# Patient Record
Sex: Female | Born: 1960 | Hispanic: No | Smoking: Never smoker
Health system: Southern US, Community
[De-identification: ages and names within clinical notes are randomized; demographics above are authoritative.]

## PROBLEM LIST (undated history)

## (undated) DIAGNOSIS — R0600 Dyspnea, unspecified: Secondary | ICD-10-CM

## (undated) DIAGNOSIS — R519 Headache, unspecified: Secondary | ICD-10-CM

## (undated) DIAGNOSIS — I4891 Unspecified atrial fibrillation: Secondary | ICD-10-CM

## (undated) DIAGNOSIS — E079 Disorder of thyroid, unspecified: Secondary | ICD-10-CM

## (undated) DIAGNOSIS — R413 Other amnesia: Secondary | ICD-10-CM

## (undated) DIAGNOSIS — E119 Type 2 diabetes mellitus without complications: Secondary | ICD-10-CM

## (undated) DIAGNOSIS — E785 Hyperlipidemia, unspecified: Secondary | ICD-10-CM

## (undated) DIAGNOSIS — I10 Essential (primary) hypertension: Secondary | ICD-10-CM

## (undated) DIAGNOSIS — I639 Cerebral infarction, unspecified: Secondary | ICD-10-CM

## (undated) DIAGNOSIS — G709 Myoneural disorder, unspecified: Secondary | ICD-10-CM

## (undated) DIAGNOSIS — J189 Pneumonia, unspecified organism: Secondary | ICD-10-CM

## (undated) DIAGNOSIS — R569 Unspecified convulsions: Secondary | ICD-10-CM

## (undated) DIAGNOSIS — Z9989 Dependence on other enabling machines and devices: Secondary | ICD-10-CM

## (undated) DIAGNOSIS — I2510 Atherosclerotic heart disease of native coronary artery without angina pectoris: Secondary | ICD-10-CM

---

## 2017-10-24 DIAGNOSIS — I639 Cerebral infarction, unspecified: Secondary | ICD-10-CM

## 2017-10-24 HISTORY — DX: Cerebral infarction, unspecified: I63.9

## 2021-10-21 ENCOUNTER — Other Ambulatory Visit: Payer: Self-pay

## 2021-10-21 ENCOUNTER — Encounter (HOSPITAL_COMMUNITY): Payer: Self-pay

## 2021-10-21 ENCOUNTER — Ambulatory Visit (HOSPITAL_COMMUNITY)
Admission: EM | Admit: 2021-10-21 | Discharge: 2021-10-21 | Disposition: A | Discharge status: Home / Self Care | Payer: Self-pay | Attending: Family Medicine | Admitting: Family Medicine

## 2021-10-21 ENCOUNTER — Ambulatory Visit (INDEPENDENT_AMBULATORY_CARE_PROVIDER_SITE_OTHER): Payer: Self-pay

## 2021-10-21 DIAGNOSIS — J129 Viral pneumonia, unspecified: Secondary | ICD-10-CM | POA: Insufficient documentation

## 2021-10-21 DIAGNOSIS — I2510 Atherosclerotic heart disease of native coronary artery without angina pectoris: Secondary | ICD-10-CM | POA: Insufficient documentation

## 2021-10-21 DIAGNOSIS — R059 Cough, unspecified: Secondary | ICD-10-CM

## 2021-10-21 DIAGNOSIS — Z20822 Contact with and (suspected) exposure to covid-19: Secondary | ICD-10-CM | POA: Insufficient documentation

## 2021-10-21 HISTORY — DX: Disorder of thyroid, unspecified: E07.9

## 2021-10-21 HISTORY — DX: Type 2 diabetes mellitus without complications: E11.9

## 2021-10-21 LAB — SARS CORONAVIRUS 2 (TAT 6-24 HRS): SARS Coronavirus 2: NEGATIVE

## 2021-10-21 MED ORDER — DOXYCYCLINE HYCLATE 100 MG PO CAPS: 100.0000 mg | ORAL_CAPSULE | Freq: Two times a day (BID) | ORAL | 0 refills | Status: AC

## 2021-10-21 NOTE — Discharge Instructions (Signed)
°  You have been swabbed for COVID, and the test will result in the next 24 hours. Our staff will call you if positive. If the test is positive, you should quarantine for 5 days.  °

## 2021-10-21 NOTE — ED Triage Notes (Signed)
Pt presents for cough,congestion and loss of sense of smell and taste x 2 days. She would like a covid testing today.

## 2021-10-21 NOTE — ED Provider Notes (Addendum)
[facility]    CSN: [id number] Arrival date & time: 10/21/21  1012      History   Chief Complaint Chief Complaint  Patient presents with   Cough   Nasal Congestion    HPI Christy Poole is a 60 y.o. female.    Cough Here for a 1 week h/o cough and congestion, and she felt worse starting about 2-3 days ago. Has lost her sense of taste and smell. Some subjective fever. No v/d.  PMH: CAD  Past Medical History:  Diagnosis Date   Diabetes mellitus without complication (HCC)    Thyroid disease     There are no problems to display for this patient.   History reviewed. No pertinent surgical history.  OB History   No obstetric history on file.      Home Medications    Prior to Admission medications   Medication Sig Start Date End Date Taking? Authorizing Provider  aspirin EC 81 MG tablet Take 81 mg by mouth daily. Swallow whole.   Yes [provider]  atorvastatin (LIPITOR) 80 MG tablet Take 80 mg by mouth daily.   Yes [provider]  doxycycline (VIBRAMYCIN) 100 MG capsule Take 1 capsule (100 mg total) by mouth 2 (two) times daily for 7 days. 10/21/21 10/28/21 Yes Eirene Rather, Janace Aris, MD  levothyroxine (SYNTHROID) 100 MCG tablet Take 50 mcg by mouth daily before breakfast.   Yes [provider]  warfarin (COUMADIN) 2.5 MG tablet Take 2.5 mg by mouth daily.   Yes [provider]    Family History History reviewed. No pertinent family history.  Social History Social History   Tobacco Use   Smoking status: Never   Smokeless tobacco: Never  Substance Use Topics   Alcohol use: Never   Drug use: Never     Allergies   Patient has no allergy information on record.   Review of Systems Review of Systems  Respiratory:  Positive for cough.     Physical Exam Triage Vital Signs ED Triage Vitals  Enc Vitals Group     BP 10/21/21 1116 (!) 125/97     Pulse Rate 10/21/21 1116 (!) 114     Resp 10/21/21 1116 18      Temp 10/21/21 1116 98.2 F (36.8 C)     Temp Source 10/21/21 1116 Oral     SpO2 10/21/21 1116 97 %     Weight --      Height --      Head Circumference --      Peak Flow --      Pain Score 10/21/21 1117 0     Pain Loc --      Pain Edu? --      Excl. in GC? --    No data found.  Updated Vital Signs BP (!) 125/97 (BP Location: Left Arm)    Pulse (!) 114    Temp 98.2 F (36.8 C) (Oral)    Resp 18    SpO2 97%   Visual Acuity Right Eye Distance:   Left Eye Distance:   Bilateral Distance:    Right Eye Near:   Left Eye Near:    Bilateral Near:     Physical Exam Vitals reviewed.  Constitutional:      General: She is not in acute distress.    Appearance: She is not toxic-appearing.  HENT:     Right Ear: Tympanic membrane normal.     Ears:     Comments: Left  TM is dull and gray with old scar    Nose: Nose normal.     Mouth/Throat:     Mouth: Mucous membranes are moist.     Pharynx: Oropharyngeal exudate (clear mucus) present. No posterior oropharyngeal erythema.  Eyes:     Extraocular Movements: Extraocular movements intact.     Pupils: Pupils are equal, round, and reactive to light.  Cardiovascular:     Rate and Rhythm: Normal rate and regular rhythm.     Heart sounds: No murmur heard. Pulmonary:     Breath sounds: Normal breath sounds. No wheezing.  Musculoskeletal:     Cervical back: Neck supple.  Lymphadenopathy:     Cervical: No cervical adenopathy.  Skin:    Coloration: Skin is not jaundiced or pale.  Neurological:     General: No focal deficit present.     Mental Status: She is alert and oriented to person, place, and time.  Psychiatric:        Behavior: Behavior normal.     UC Treatments / Results  Labs (all labs ordered are listed, but only abnormal results are displayed) Labs Reviewed  SARS CORONAVIRUS 2 (TAT 6-24 HRS)    EKG   Radiology DG Chest 2 View  Result Date: 10/21/2021 CLINICAL DATA:  Cough and congestion with loss of sense of  smell and taste for 2 days. EXAM: CHEST - 2 VIEW COMPARISON:  None. FINDINGS: The cardiac silhouette is borderline enlarged. The lungs are borderline hyperinflated. Airway thickening and mild interstitial opacities are present bilaterally. No focal airspace consolidation, pleural effusion, or pneumothorax is identified. No acute osseous abnormality is seen. IMPRESSION: Airway thickening and mild interstitial opacities which may reflect atypical/viral infection. Electronically Signed   By: Sebastian Ache M.D.   On: 10/21/2021 12:17    Procedures Procedures (including critical care time)  Medications Ordered in UC Medications - No data to display  Initial Impression / Assessment and Plan / UC Course  I have reviewed the triage vital signs and the nursing notes.  Pertinent labs & imaging results that were available during my care of the patient were reviewed by me and considered in my medical decision making (see chart for details).     See above CXR interpretation, so possible pneumonia. Will swab for covid, and begin oral antibiotic  Final Clinical Impressions(s) / UC Diagnoses   Final diagnoses:  Pneumonia, viral     Discharge Instructions       You have been swabbed for COVID, and the test will result in the next 24 hours. Our staff will call you if positive. If the test is positive, you should quarantine for 5 days.        ED Prescriptions     Medication Sig Dispense Auth. Provider   doxycycline (VIBRAMYCIN) 100 MG capsule Take 1 capsule (100 mg total) by mouth 2 (two) times daily for 7 days. 14 capsule Marlinda Mike, Janace Aris, MD      PDMP not reviewed this encounter.   Zenia Resides, MD 10/21/21 1223    Zenia Resides, MD 10/21/21 [phone]

## 2021-12-01 ENCOUNTER — Encounter (HOSPITAL_BASED_OUTPATIENT_CLINIC_OR_DEPARTMENT_OTHER): Payer: Self-pay | Admitting: Family Medicine

## 2021-12-01 ENCOUNTER — Other Ambulatory Visit: Payer: Self-pay

## 2021-12-01 ENCOUNTER — Ambulatory Visit (INDEPENDENT_AMBULATORY_CARE_PROVIDER_SITE_OTHER): Payer: 59 | Admitting: Family Medicine

## 2021-12-01 DIAGNOSIS — I4821 Permanent atrial fibrillation: Secondary | ICD-10-CM | POA: Insufficient documentation

## 2021-12-01 DIAGNOSIS — E039 Hypothyroidism, unspecified: Secondary | ICD-10-CM | POA: Diagnosis not present

## 2021-12-01 DIAGNOSIS — E785 Hyperlipidemia, unspecified: Secondary | ICD-10-CM

## 2021-12-01 DIAGNOSIS — E119 Type 2 diabetes mellitus without complications: Secondary | ICD-10-CM

## 2021-12-01 DIAGNOSIS — I4891 Unspecified atrial fibrillation: Secondary | ICD-10-CM | POA: Diagnosis not present

## 2021-12-01 DIAGNOSIS — Z8673 Personal history of transient ischemic attack (TIA), and cerebral infarction without residual deficits: Secondary | ICD-10-CM | POA: Diagnosis not present

## 2021-12-01 NOTE — Progress Notes (Signed)
New Patient Office Visit  Subjective:  Patient ID: Christy Poole, female    DOB: 07/10/1961  Age: 61 y.o. MRN: [id number]  CC:  Chief Complaint  Patient presents with   Establish Care    Prior PCP - [place]   Referral    Patient is a cardiac patient with hx of Afib and is requesting a referral to cardiology. She also had a stroke in 2019 and has neuropathy, she is requesting a referral to Neurology   Labs Only    Patient's son in requesting to have full lab panels and INR check as she is on coumadin and has not had it checked recently    HPI Christy Poole is a 61 year old female presenting to establish in clinic.  She is accompanied by family member who assists with interpretation.  They have current concerns as outlined above.  Past medical history significant for diabetes, hyperlipidemia, atrial fibrillation, mitral valve stenosis, hypothyroidism.  Atrial fibrillation: Reports that she was following with cardiology when living in [place].  Currently managed on warfarin, takes 2.5 mg daily.  Denies any issues of bleeding, reports that INR generally has been well controlled.  Hypothyroidism: Currently takes levothyroxine 50 mcg daily, unsure of prior TSH.  No obvious symptoms of thyroid disease at present  Hyperlipidemia: Currently taking atorvastatin 80 mg, has been tolerating medication well, no issues with myalgias.  Diabetes: Denies any current medications, mainly managing with lifestyle modifications, unsure of recent A1c.  Review of prior records indicates that she was being prescribed metformin at 1 point however this was discontinued  Most recently, patient was living in [place], moved here in December 2022 with family who moved here for work.  Past Medical History:  Diagnosis Date   Atrial fibrillation ([place])    Diabetes mellitus without complication ([place])    Stroke [facility])    Thyroid disease     History reviewed. No pertinent surgical history.  History reviewed. No  pertinent family history.  Social History   Socioeconomic History   Marital status: Widowed    Spouse name: Not on file   Number of children: Not on file   Years of education: Not on file   Highest education level: Not on file  Occupational History   Not on file  Tobacco Use   Smoking status: Never   Smokeless tobacco: Never  Vaping Use   Vaping Use: Never used  Substance and Sexual Activity   Alcohol use: Never   Drug use: Never   Sexual activity: Never  Other Topics Concern   Not on file  Social History Narrative   Not on file   Social Determinants of Health   Financial Resource Strain: Not on file  Food Insecurity: Not on file  Transportation Needs: Not on file  Physical Activity: Not on file  Stress: Not on file  Social Connections: Not on file  Intimate Partner Violence: Not on file    Objective:   Today's Vitals: BP 124/70    Pulse 68    Ht 4' 9.5" (1.461 m)    Wt 107 lb (48.5 kg)    SpO2 100%    BMI 22.75 kg/m   Physical Exam  61 year old female in no acute distress Cardiovascular exam with regular rate, irregularly irregular rhythm Lungs clear to auscultation bilaterally  Assessment & Plan:   Problem List Items Addressed This Visit       Cardiovascular and Mediastinum   Atrial fibrillation ([place])    In atrial fibrillation  on exam today, continue with warfarin, reportedly has been tolerating with good control of INR in therapeutic range We will check INR today to evaluate Will refer to cardiology for patient to establish in regards to her atrial fibrillation and cardiac history      Relevant Orders   CBC with Differential/Platelet (Completed)   Comprehensive metabolic panel (Completed)   Hemoglobin A1c (Completed)   Ambulatory referral to Cardiology   INR/PT (Completed)     Endocrine   Hypothyroidism    Appears euthyroid clinically, will continue with current dose of levothyroxine We will check TSH in order to evaluate current status and any  dose changes required      Relevant Orders   TSH (Completed)   Diabetes mellitus ([place])    Uncertain status, will check hemoglobin A1c in order to determine if further treatment is required        Other   History of stroke    History of stroke, will request records from PCP, will refer to neurology for further evaluation and recommendations      Relevant Orders   CBC with Differential/Platelet (Completed)   Comprehensive metabolic panel (Completed)   Ambulatory referral to Neurology   Dyslipidemia    Tolerating atorvastatin, will continue this time We will check lipid panel in order to assess current status      Relevant Orders   Lipid panel (Completed)    No facility-administered encounter medications on file as of 12/01/2021.   Outpatient Encounter Medications as of 12/01/2021  Medication Sig   aspirin EC 81 MG tablet Take 81 mg by mouth daily. Swallow whole.   atorvastatin (LIPITOR) 80 MG tablet Take 80 mg by mouth daily.   warfarin (COUMADIN) 2.5 MG tablet Take 5 mg by mouth at bedtime.   [DISCONTINUED] levothyroxine (SYNTHROID) 100 MCG tablet Take 50 mcg by mouth at bedtime.    Follow-up: Return in about 4 weeks (around 12/29/2021).  Plan for follow-up in about 4 weeks to monitor, review labs and discuss any further interventions recommended  Naara Kelty J [place], MD

## 2021-12-01 NOTE — Patient Instructions (Signed)
°  Medication Instructions:  Your physician recommends that you continue on your current medications as directed. Please refer to the Current Medication list given to you today. --If you need a refill on any your medications before your next appointment, please call your pharmacy first. If no refills are authorized on file call the office.-- Lab Work: Your physician has recommended that you have lab work today: CBC, CMP, Lipid, A1C, INR, and TSH If you have labs (blood work) drawn today and your tests are completely normal, you will receive your results via [place] a phone call from our staff.  Please ensure you check your voicemail in the event that you authorized detailed messages to be left on a delegated number. If you have any lab test that is abnormal or we need to change your treatment, we will call you to review the results.  Referrals/Procedures/Imaging: A referral has been placed for you to Neurology for evaluation and treatment. Someone from the scheduling department will be in contact with you in regards to coordinating your consultation. If you do not hear from any of the schedulers within 7-10 business days please give their office a call.  A referral has been placed for you to Cardiology for evaluation and treatment. Someone from the scheduling department will be in contact with you in regards to coordinating your consultation. If you do not hear from any of the schedulers within 7-10 business days please give their office a call.   Follow-Up: Your next appointment:   Your physician recommends that you schedule a follow-up appointment in: 3-4 WEEKS with Dr. de [place]  You will receive a text message or e-mail with a link to a survey about your care and experience with [place] today! We would greatly appreciate your feedback!   Thanks for letting us be apart of your health journey!!  Primary Care and Sports Medicine   Dr. Arlina Robes [place]   We encourage you to activate your  patient portal called "MyChart".  Sign up information is provided on this After Visit Summary.  MyChart is used to connect with patients for Virtual Visits (Telemedicine).  Patients are able to view lab/test results, encounter notes, upcoming appointments, etc.  Non-urgent messages can be sent to your provider as well. To learn more about what you can do with MyChart, please visit --  [email or web address].

## 2021-12-02 LAB — CBC WITH DIFFERENTIAL/PLATELET
Basophils Absolute: 0.1 10*3/uL (ref 0.0–0.2)
Basos: 1 %
EOS (ABSOLUTE): 0.2 10*3/uL (ref 0.0–0.4)
Eos: 3 %
Hematocrit: 41.7 % (ref 34.0–46.6)
Hemoglobin: 13.4 g/dL (ref 11.1–15.9)
Immature Grans (Abs): 0 10*3/uL (ref 0.0–0.1)
Immature Granulocytes: 0 %
Lymphocytes Absolute: 1.5 10*3/uL (ref 0.7–3.1)
Lymphs: 24 %
MCH: 28.2 pg (ref 26.6–33.0)
MCHC: 32.1 g/dL (ref 31.5–35.7)
MCV: 88 fL (ref 79–97)
Monocytes Absolute: 0.4 10*3/uL (ref 0.1–0.9)
Monocytes: 7 %
Neutrophils Absolute: 4 10*3/uL (ref 1.4–7.0)
Neutrophils: 65 %
Platelets: 167 10*3/uL (ref 150–450)
RBC: 4.76 x10E6/uL (ref 3.77–5.28)
RDW: 12.7 % (ref 11.7–15.4)
WBC: 6.2 10*3/uL (ref 3.4–10.8)

## 2021-12-02 LAB — COMPREHENSIVE METABOLIC PANEL
ALT: 23 IU/L (ref 0–32)
AST: 26 IU/L (ref 0–40)
Albumin/Globulin Ratio: 1.5 (ref 1.2–2.2)
Albumin: 4.4 g/dL (ref 3.8–4.8)
Alkaline Phosphatase: 103 IU/L (ref 44–121)
BUN/Creatinine Ratio: 18 (ref 12–28)
BUN: 16 mg/dL (ref 8–27)
Bilirubin Total: 0.4 mg/dL (ref 0.0–1.2)
CO2: 23 mmol/L (ref 20–29)
Calcium: 10 mg/dL (ref 8.7–10.3)
Chloride: 105 mmol/L (ref 96–106)
Creatinine, Ser: 0.88 mg/dL (ref 0.57–1.00)
Globulin, Total: 2.9 g/dL (ref 1.5–4.5)
Glucose: 115 mg/dL — ABNORMAL HIGH (ref 70–99)
Potassium: 4.9 mmol/L (ref 3.5–5.2)
Sodium: 140 mmol/L (ref 134–144)
Total Protein: 7.3 g/dL (ref 6.0–8.5)
eGFR: 74 mL/min/{1.73_m2} (ref >59)

## 2021-12-02 LAB — LIPID PANEL
Chol/HDL Ratio: 2 ratio (ref 0.0–4.4)
Cholesterol, Total: 104 mg/dL (ref 100–199)
HDL: 51 mg/dL (ref >39)
LDL Chol Calc (NIH): 36 mg/dL (ref 0–99)
Triglycerides: 84 mg/dL (ref 0–149)
VLDL Cholesterol Cal: 17 mg/dL (ref 5–40)

## 2021-12-02 LAB — HEMOGLOBIN A1C
Est. average glucose Bld gHb Est-mCnc: 143 mg/dL
Hgb A1c MFr Bld: 6.6 % — ABNORMAL HIGH (ref 4.8–5.6)

## 2021-12-02 LAB — PROTIME-INR
INR: 2.1 — ABNORMAL HIGH (ref 0.9–1.2)
Prothrombin Time: 21.4 s — ABNORMAL HIGH (ref 9.1–12.0)

## 2021-12-02 LAB — TSH: TSH: 2.62 u[IU]/mL (ref 0.450–4.500)

## 2021-12-08 ENCOUNTER — Emergency Department (HOSPITAL_COMMUNITY): Payer: 59

## 2021-12-08 ENCOUNTER — Encounter (HOSPITAL_COMMUNITY): Payer: Self-pay | Admitting: Pediatrics

## 2021-12-08 ENCOUNTER — Other Ambulatory Visit: Payer: Self-pay

## 2021-12-08 ENCOUNTER — Observation Stay (HOSPITAL_COMMUNITY)
Admission: EM | Admit: 2021-12-08 | Discharge: 2021-12-11 | Disposition: A | Discharge status: Home Health | Payer: 59 | Attending: Family Medicine | Admitting: Family Medicine

## 2021-12-08 DIAGNOSIS — I4821 Permanent atrial fibrillation: Secondary | ICD-10-CM | POA: Diagnosis present

## 2021-12-08 DIAGNOSIS — D696 Thrombocytopenia, unspecified: Secondary | ICD-10-CM | POA: Diagnosis not present

## 2021-12-08 DIAGNOSIS — E039 Hypothyroidism, unspecified: Secondary | ICD-10-CM | POA: Insufficient documentation

## 2021-12-08 DIAGNOSIS — E119 Type 2 diabetes mellitus without complications: Secondary | ICD-10-CM | POA: Insufficient documentation

## 2021-12-08 DIAGNOSIS — Z20822 Contact with and (suspected) exposure to covid-19: Secondary | ICD-10-CM | POA: Insufficient documentation

## 2021-12-08 DIAGNOSIS — Z7982 Long term (current) use of aspirin: Secondary | ICD-10-CM | POA: Insufficient documentation

## 2021-12-08 DIAGNOSIS — Z8673 Personal history of transient ischemic attack (TIA), and cerebral infarction without residual deficits: Secondary | ICD-10-CM | POA: Insufficient documentation

## 2021-12-08 DIAGNOSIS — I2510 Atherosclerotic heart disease of native coronary artery without angina pectoris: Secondary | ICD-10-CM | POA: Diagnosis not present

## 2021-12-08 DIAGNOSIS — Z955 Presence of coronary angioplasty implant and graft: Secondary | ICD-10-CM | POA: Diagnosis not present

## 2021-12-08 DIAGNOSIS — E785 Hyperlipidemia, unspecified: Secondary | ICD-10-CM | POA: Diagnosis present

## 2021-12-08 DIAGNOSIS — Z7901 Long term (current) use of anticoagulants: Secondary | ICD-10-CM | POA: Diagnosis not present

## 2021-12-08 DIAGNOSIS — Z79899 Other long term (current) drug therapy: Secondary | ICD-10-CM | POA: Diagnosis not present

## 2021-12-08 DIAGNOSIS — I4891 Unspecified atrial fibrillation: Secondary | ICD-10-CM | POA: Insufficient documentation

## 2021-12-08 DIAGNOSIS — I050 Rheumatic mitral stenosis: Secondary | ICD-10-CM | POA: Diagnosis present

## 2021-12-08 DIAGNOSIS — G459 Transient cerebral ischemic attack, unspecified: Principal | ICD-10-CM | POA: Insufficient documentation

## 2021-12-08 DIAGNOSIS — R531 Weakness: Secondary | ICD-10-CM | POA: Diagnosis present

## 2021-12-08 DIAGNOSIS — R29898 Other symptoms and signs involving the musculoskeletal system: Secondary | ICD-10-CM | POA: Diagnosis present

## 2021-12-08 HISTORY — DX: Unspecified atrial fibrillation: I48.91

## 2021-12-08 HISTORY — DX: Cerebral infarction, unspecified: I63.9

## 2021-12-08 LAB — CBC WITH DIFFERENTIAL/PLATELET
Abs Immature Granulocytes: 0 10*3/uL (ref 0.00–0.07)
Basophils Absolute: 0.1 10*3/uL (ref 0.0–0.1)
Basophils Relative: 1 %
Eosinophils Absolute: 0.2 10*3/uL (ref 0.0–0.5)
Eosinophils Relative: 3 %
HCT: 42.6 % (ref 36.0–46.0)
Hemoglobin: 13.6 g/dL (ref 12.0–15.0)
Immature Granulocytes: 0 %
Lymphocytes Relative: 31 %
Lymphs Abs: 2.1 10*3/uL (ref 0.7–4.0)
MCH: 28.3 pg (ref 26.0–34.0)
MCHC: 31.9 g/dL (ref 30.0–36.0)
MCV: 88.6 fL (ref 80.0–100.0)
Monocytes Absolute: 0.5 10*3/uL (ref 0.1–1.0)
Monocytes Relative: 8 %
Neutro Abs: 3.8 10*3/uL (ref 1.7–7.7)
Neutrophils Relative %: 57 %
Platelets: 169 10*3/uL (ref 150–400)
RBC: 4.81 MIL/uL (ref 3.87–5.11)
RDW: 13.3 % (ref 11.5–15.5)
WBC: 6.8 10*3/uL (ref 4.0–10.5)
nRBC: 0 % (ref 0.0–0.2)

## 2021-12-08 LAB — BASIC METABOLIC PANEL
Anion gap: 8 (ref 5–15)
BUN: 15 mg/dL (ref 8–23)
CO2: 23 mmol/L (ref 22–32)
Calcium: 9.4 mg/dL (ref 8.9–10.3)
Chloride: 107 mmol/L (ref 98–111)
Creatinine, Ser: 0.8 mg/dL (ref 0.44–1.00)
GFR, Estimated: >60 mL/min (ref >60)
Glucose, Bld: 119 mg/dL — ABNORMAL HIGH (ref 70–99)
Potassium: 4.3 mmol/L (ref 3.5–5.1)
Sodium: 138 mmol/L (ref 135–145)

## 2021-12-08 LAB — TROPONIN I (HIGH SENSITIVITY)
Troponin I (High Sensitivity): 6 ng/L (ref <18)
Troponin I (High Sensitivity): 7 ng/L (ref <18)

## 2021-12-08 NOTE — ED Triage Notes (Signed)
Son at bedside, reported stroke this morning around 4 am upon waking up, c/o weakness, unable to speak, no balance; reported weakness; and left sided facial drop. C/O chest pain for two days now.

## 2021-12-08 NOTE — ED Provider Triage Note (Signed)
Emergency Medicine Provider Triage Evaluation Note  Christy Poole , a 61 y.o. female  was evaluated in triage.  Pt complains of altered mental status, chest pain.  Family states over last few days patient has had weakness.  Yesterday evening around 10 PM had some dizziness and seemed "off" when walking.  Patient woke up this morning and had some chest pain.  No vision changes.  History of prior CVA and A-fib.  No recent falls or injuries.  Family states patient has had some confusion.  Review of Systems  Positive: Weakness, chest pain, confusion Negative: Fever, emesis  Physical Exam  BP (!) 140/94 (BP Location: Left Arm)    Pulse 63    Temp 98.4 F (36.9 C) (Oral)    Resp 18    Ht 4' 9.5" (1.461 m)    Wt 48.5 kg    SpO2 100%    BMI 22.75 kg/m  Gen:   Awake, no distress   Resp:  Normal effort  MSK:   Moves extremities without difficulty  Neuro:  Cn 2-12 grossly intact, equal strength, intact sensation Other:    Medical Decision Making  Medically screening exam initiated at 4:35 PM.  Appropriate orders placed.  Christy NAZYIAH WELLMAN was informed that the remainder of the evaluation will be completed by another provider, this initial triage assessment does not replace that evaluation, and the importance of remaining in the ED until their evaluation is complete.  Weakness, chest pain, confusion  Patient is not a code stroke, does not meet LVO criteria.   Ewing Fandino A, PA-C 12/08/21 1637

## 2021-12-09 ENCOUNTER — Emergency Department (HOSPITAL_COMMUNITY): Payer: 59

## 2021-12-09 ENCOUNTER — Observation Stay (HOSPITAL_BASED_OUTPATIENT_CLINIC_OR_DEPARTMENT_OTHER): Payer: 59

## 2021-12-09 ENCOUNTER — Encounter (HOSPITAL_COMMUNITY): Payer: Self-pay | Admitting: Family Medicine

## 2021-12-09 DIAGNOSIS — G459 Transient cerebral ischemic attack, unspecified: Secondary | ICD-10-CM

## 2021-12-09 DIAGNOSIS — R29898 Other symptoms and signs involving the musculoskeletal system: Secondary | ICD-10-CM | POA: Diagnosis not present

## 2021-12-09 DIAGNOSIS — Z8673 Personal history of transient ischemic attack (TIA), and cerebral infarction without residual deficits: Secondary | ICD-10-CM

## 2021-12-09 DIAGNOSIS — I4891 Unspecified atrial fibrillation: Secondary | ICD-10-CM

## 2021-12-09 DIAGNOSIS — E119 Type 2 diabetes mellitus without complications: Secondary | ICD-10-CM | POA: Diagnosis not present

## 2021-12-09 DIAGNOSIS — E785 Hyperlipidemia, unspecified: Secondary | ICD-10-CM | POA: Diagnosis not present

## 2021-12-09 DIAGNOSIS — E039 Hypothyroidism, unspecified: Secondary | ICD-10-CM

## 2021-12-09 LAB — ECHOCARDIOGRAM COMPLETE
AR max vel: 0.96 cm2
AV Area VTI: 0.94 cm2
AV Area mean vel: 0.92 cm2
AV Mean grad: 3 mmHg
AV Peak grad: 6 mmHg
Ao pk vel: 1.22 m/s
Area-P 1/2: 1.95 cm2
Calc EF: 48.3 %
Height: 57.5 in
MV VTI: 0.66 cm2
S' Lateral: 2.7 cm
Single Plane A2C EF: 46.9 %
Single Plane A4C EF: 51.8 %
Weight: 1712 oz

## 2021-12-09 LAB — PROTIME-INR
INR: 2.2 — ABNORMAL HIGH (ref 0.8–1.2)
Prothrombin Time: 24.7 seconds — ABNORMAL HIGH (ref 11.4–15.2)

## 2021-12-09 LAB — RESP PANEL BY RT-PCR (FLU A&B, COVID) ARPGX2
Influenza A by PCR: NEGATIVE
Influenza B by PCR: NEGATIVE
SARS Coronavirus 2 by RT PCR: NEGATIVE

## 2021-12-09 LAB — CBG MONITORING, ED: Glucose-Capillary: 128 mg/dL — ABNORMAL HIGH (ref 70–99)

## 2021-12-09 MED ORDER — ASPIRIN EC 81 MG PO TBEC
81.0000 mg | DELAYED_RELEASE_TABLET | Freq: Every day | ORAL | Status: DC
  Administered 2021-12-09 – 2021-12-11 (×3): 81 mg via ORAL
  Filled 2021-12-09 (×3): qty 1

## 2021-12-09 MED ORDER — ACETAMINOPHEN 500 MG PO TABS: 1000.0000 mg | ORAL_TABLET | Freq: Four times a day (QID) | ORAL | Status: DC | PRN

## 2021-12-09 MED ORDER — LACTATED RINGERS IV SOLN: INTRAVENOUS | Status: DC

## 2021-12-09 MED ORDER — IOHEXOL 350 MG/ML SOLN
75.0000 mL | Freq: Once | INTRAVENOUS | Status: AC | PRN
  Administered 2021-12-09: 75 mL via INTRAVENOUS

## 2021-12-09 MED ORDER — WARFARIN - PHARMACIST DOSING INPATIENT: Freq: Every day | Status: DC

## 2021-12-09 MED ORDER — ATORVASTATIN CALCIUM 80 MG PO TABS
80.0000 mg | ORAL_TABLET | Freq: Every day | ORAL | Status: DC
  Administered 2021-12-09 – 2021-12-11 (×3): 80 mg via ORAL
  Filled 2021-12-09 (×3): qty 1

## 2021-12-09 MED ORDER — STROKE: EARLY STAGES OF RECOVERY BOOK
Freq: Once | Status: AC
  Filled 2021-12-09: qty 1

## 2021-12-09 MED ORDER — SENNOSIDES-DOCUSATE SODIUM 8.6-50 MG PO TABS: 1.0000 | ORAL_TABLET | Freq: Every evening | ORAL | Status: DC | PRN

## 2021-12-09 MED ORDER — LEVOTHYROXINE SODIUM 50 MCG PO TABS
50.0000 ug | ORAL_TABLET | Freq: Every day | ORAL | Status: DC
  Administered 2021-12-09 – 2021-12-10 (×2): 50 ug via ORAL
  Filled 2021-12-09 (×2): qty 1

## 2021-12-09 MED ORDER — WARFARIN SODIUM 5 MG PO TABS
5.0000 mg | ORAL_TABLET | Freq: Once | ORAL | Status: AC
  Administered 2021-12-09: 5 mg via ORAL
  Filled 2021-12-09: qty 1

## 2021-12-09 NOTE — Assessment & Plan Note (Signed)
Uncertain status, will check hemoglobin A1c in order to determine if further treatment is required

## 2021-12-09 NOTE — ED Notes (Signed)
ED Provider at bedside. 

## 2021-12-09 NOTE — ED Notes (Signed)
Provider at bedside

## 2021-12-09 NOTE — Assessment & Plan Note (Signed)
In atrial fibrillation on exam today, continue with warfarin, reportedly has been tolerating with good control of INR in therapeutic range We will check INR today to evaluate Will refer to cardiology for patient to establish in regards to her atrial fibrillation and cardiac history

## 2021-12-09 NOTE — Assessment & Plan Note (Signed)
Recent LDL 38, HDL 51. Will continue high-intensity statin.

## 2021-12-09 NOTE — Progress Notes (Signed)
ANTICOAGULATION CONSULT NOTE - Initial Consult  Pharmacy Consult for warfarin Indication: atrial fibrillation  No Known Allergies  Patient Measurements: Height: 4' 9.5" (146.1 cm) Weight: 48.5 kg (107 lb) IBW/kg (Calculated) : 39.75  Vital Signs: Temp: 97.9 F (36.6 C) (02/16 1230) Temp Source: Oral (02/16 1230) BP: 148/78 (02/16 1230) Pulse Rate: 84 (02/16 1230)  Labs: Recent Labs    12/08/21 1657 12/08/21 2111 12/09/21 1233  HGB 13.6  --   --   HCT 42.6  --   --   PLT 169  --   --   LABPROT  --   --  24.7*  INR  --   --  2.2*  CREATININE 0.80  --   --   TROPONINIHS 6 7  --     Estimated Creatinine Clearance: 49.8 mL/min (by C-G formula based on SCr of 0.8 mg/dL).   Medical History: Past Medical History:  Diagnosis Date   Atrial fibrillation ([place])    Diabetes mellitus without complication ([place])    Stroke ([place])    Thyroid disease     Assessment: 61 YO female admitted for facial droop and left-sided weakness. Patient was on warfarin prior to admission for atrial fibrillation. Today's INR was therapeutic at 2.2. Will continue home regimen of warfarin 5 mg daily.   Goal of Therapy:  INR 2-3 Monitor platelets by anticoagulation protocol: Yes   Plan:  Warfarin 5 mg x1 Monitor CBC, INR, drug interactions, and s/sx of bleeding daily  Joseph Art, Pharm.D. PGY-1 Pharmacy Resident [phone] 12/09/2021 2:28 PM

## 2021-12-09 NOTE — Progress Notes (Signed)
SLP Cancellation Note  Patient Details Name: Christy Poole MRN: [id number] DOB: 07/31/1961   Cancelled treatment:       Reason Eval/Treat Not Completed: SLP screened, no needs identified, will sign off.  Pt passed [facility] swallow screen and Dois Davenport, RN denied any difficulty with p.o. intake since. Thus, no formalized SLP swallow eval is needed per protocol. Will sign off.     Chalsea Darko I. Vear Clock, MS, CCC-SLP Acute Rehabilitation Services Office number [phone] Pager [phone]  Scheryl Marten 12/09/2021, 2:50 PM

## 2021-12-09 NOTE — Assessment & Plan Note (Signed)
Appears euthyroid clinically, will continue with current dose of levothyroxine We will check TSH in order to evaluate current status and any dose changes required

## 2021-12-09 NOTE — TOC Initial Note (Signed)
Transition of Care [facility]) - Initial/Assessment Note    Patient Details  Name: Christy Poole MRN: [id number] Date of Birth: May 25, 1961  Transition of [facility]) CM/SW Contact:    Verdell Carmine, RN Phone Number: 12/09/2021, 2:08 PM  Clinical Narrative:                  Medication assistance consult for TOC. Patient has health insurance therefore is not eligible for [facility] medication assistance.    The Transition of Care Department [facility]) has reviewed patient and no TOC needs have been identified at this time. We will continue to monitor patient advancement through interdisciplinary progression rounds. If new patient transition needs arise, please place a TOC consult         Patient Goals and CMS Choice        Expected Discharge Plan and Services                                                Prior Living Arrangements/Services                       Activities of Daily Living      Permission Sought/Granted                  Emotional Assessment              Admission diagnosis:  Left hand weakness [R29.898] Patient Active Problem List   Diagnosis Date Noted   Left hand weakness 12/09/2021   Diabetes mellitus ([place]) 12/09/2021   Hypothyroidism 12/01/2021   Atrial fibrillation ([place]) 12/01/2021   History of stroke 12/01/2021   Dyslipidemia 12/01/2021   PCP:  de [place], Raymond J, MD Pharmacy:   [place], [place] - [address] [place] [address] Phone: [phone] Fax: [phone]     Social Determinants of Health (SDOH) Interventions    Readmission Risk Interventions No flowsheet data found.

## 2021-12-09 NOTE — ED Notes (Signed)
Patient transported to MRI 

## 2021-12-09 NOTE — Assessment & Plan Note (Signed)
History of stroke, will request records from PCP, will refer to neurology for further evaluation and recommendations

## 2021-12-09 NOTE — H&P (Signed)
History and Physical    Patient: Christy Poole IRS:[id number] DOB: 12-28-1960 DOA: 12/08/2021 DOS: the patient was seen and examined on 12/09/2021 PCP: de [place], Raymond J, MD  Patient coming from: Home  Chief Complaint:  Chief Complaint  Patient presents with   Weakness    HPI: Christy Poole is a 61 y.o. female with medical history significant of stroke with residual left sided weakness, atrial fibrillation on coumadin, hypothyroidism and CAD s/p remote PCI who presented to the ED 2/15 with facial droop and left-sided weakness which had improved by the time of presentation.   The patient speaks only [place], lives with her son, who speaks [place] as well, in [place] where they moved a couple months ago from [place]. She stated she woke up early in the morning with droopy left face and worsen weakness in her left arm than she normally does. He confirms seeing evidence of this as well, and states she's had overall waxing/waning of weakness on the left side though progressively worsened over the past several months. These symptoms returned to their baseline prior to getting to the hospital and they ended up waiting about 18 hours to be seen after CT showed no acute stroke. Subsequent MRI has shown no acute stroke either, though there is evidence of the prior moderate-sized cortical/subcortical right MCA territory infarct as well as white matter chronic small vessel ischemic disease including a lacunar infarct of the left parietal white matter.   Neurology was consulted, recommended CTA head and neck, echocardiogram and PT/OT evaluations. Hospitalists called to admit.  Review of Systems: As mentioned in the history of present illness. All other systems reviewed and are negative.She's also had intermittent vision changes, blurry vision, and global headache. These symptoms are resolved. She's complained of intermittent chest discomfort, including at the time or presentation though is resolved now, no  dyspnea. No right ear pain, though does have decreased hearing out of that ear. No discharge.   Past Medical History:  Diagnosis Date   Atrial fibrillation (HCC)    Diabetes mellitus without complication (HCC)    Stroke [facility])    Thyroid disease    History reviewed. No pertinent surgical history.  Social History:  reports that she has never smoked. She has never used smokeless tobacco. She reports that she does not drink alcohol and does not use drugs.  No Known Allergies  History reviewed. No pertinent family history.  Prior to Admission medications   Medication Sig Start Date End Date Taking? Authorizing Provider  acetaminophen (TYLENOL) 500 MG tablet Take 1,000 mg by mouth every 6 (six) hours as needed for mild pain, headache or fever.   Yes [provider]  aspirin EC 81 MG tablet Take 81 mg by mouth daily. Swallow whole.   Yes [provider]  atorvastatin (LIPITOR) 80 MG tablet Take 80 mg by mouth daily.   Yes [provider]  levothyroxine (SYNTHROID) 50 MCG tablet Take 50 mcg by mouth at bedtime.   Yes [provider]  warfarin (COUMADIN) 2.5 MG tablet Take 5 mg by mouth at bedtime.   Yes [provider]    Physical Exam: Vitals:   12/09/21 0918 12/09/21 1000 12/09/21 1030 12/09/21 1230  BP: (!) 162/93 (!) 143/77 114/78 (!) 148/78  Pulse: 93 70 67 84  Resp: (!) 21 (!) 21 (!) 21 19  Temp: 97.6 F (36.4 C)   97.9 F (36.6 C)  TempSrc: Oral   Oral  SpO2: 100% 99% 100% 100%  Weight:      Height:      Gen: 61 y.o. female in no distress Pulm: Nonlabored breathing room air. Clear. CV: Irreg irreg, no murmur, rub, or gallop. No JVD, no pitting dependent edema. GI: Abdomen soft, non-tender, non-distended, with normoactive bowel sounds.  Ext: Warm, no deformities Skin: No rashes, lesions or ulcers on visualized skin. Neuro: Alert and oriented, speech at baseline per son. Left shoulder 3/5, elbow 4/5, grip 3/5, left hip flexor 2/5,  distally 4/5. Right strength normal. No facial droop currently. Psych: Judgement and insight appear fair. Mood euthymic & affect congruent. Behavior is appropriate.    Data Reviewed: BMP wnl and at baseline, though glucose 119.  Troponin wnl at 6 > 7 ECG low voltage with AFlutter pattern, vent rate 88bpm. No ST elevations. CBC w/diff wnl INR therapeutic at 2.2 Influenza and SARS-CoV-2 screening negative. CT head and MR brain personally reviewed, agree with radiologist interpretation without obvious new stroke.  Assessment and Plan: * Left hand weakness- (present on admission) Neurology consulted, requested admission for echo, therapy evaluations, and neuroimaging. Will place in observation, neuro checks, SLP, PT, OT ordered. Labs recently done. INR therapeutic without evidence of hemorrhage, will plan to continue coumadin, statin. Neurology formal note pending, though I discussed with Dr. Viviann Spare.  Diabetes mellitus (HCC) NIDT2DM. No home meds listed for this. Recent HbA1c at PCP 2/8 was 6.6% which is at goal. - Very sensitive SSI.  Dyslipidemia- (present on admission) Recent LDL 38, HDL 51. Will continue high-intensity statin.  Atrial fibrillation (HCC)- (present on admission) Presumed to be chronic. Rate is controlled.  - Continue coumadin - Not on rate control medication at home.  Hypothyroidism- (present on admission) Recent TSH 2.620. Continue synthroid.  Advance Care Planning: Full  Consults: Neurology  Family Communication: Son by speakerphone at bedside  Severity of Illness: The appropriate patient status for this patient is OBSERVATION. Observation status is judged to be reasonable and necessary in order to provide the required intensity of service to ensure the patient's safety. The patient's presenting symptoms, physical exam findings, and initial radiographic and laboratory data in the context of their medical condition is felt to place them at decreased risk for  further clinical deterioration. Furthermore, it is anticipated that the patient will be medically stable for discharge from the hospital within 2 midnights of admission.   Author: Tyrone Nine, MD 12/09/2021 2:19 PM  For on call review [email or web address].

## 2021-12-09 NOTE — Progress Notes (Signed)
Patient son at bedside requesting information about medicaid/medicare because patient had services when they lived in [place]. Informed son that SW & CM were gone for the day, but would be back in AM.

## 2021-12-09 NOTE — Assessment & Plan Note (Signed)
NIDT2DM. No home meds listed for this. Recent HbA1c at PCP 2/8 was 6.6% which is at goal. - Very sensitive SSI.

## 2021-12-09 NOTE — Assessment & Plan Note (Signed)
Tolerating atorvastatin, will continue this time We will check lipid panel in order to assess current status

## 2021-12-09 NOTE — Assessment & Plan Note (Addendum)
Acute on chronic. No definitive evidence of acute stroke at this time. Will continue medications as previously recommended, final recommendations from stroke neurology pending at this time.   - Home health and DME arranged.

## 2021-12-09 NOTE — Assessment & Plan Note (Signed)
Recent TSH 2.620. Continue synthroid.

## 2021-12-09 NOTE — ED Provider Notes (Signed)
[facility] EMERGENCY DEPARTMENT Provider Note   CSN: [id number] Arrival date & time: 12/08/21  1547     History  Chief Complaint  Patient presents with   Weakness    Christy Poole is a 61 y.o. female.  HPI    61 year old female comes in with her son with chief complaint of strokelike symptoms.  Patient has history of stroke in 2019, CAD with stents back in 2016 or 2017.  She resides with her son and they have moved to this area about 2 or 3 months ago.  Son is fluent in [place] and providing collateral history.  According to the patient, she woke up yesterday around 4 AM and noted that her face was droopy and she was having difficulty speaking.  She woke her son up who witnessed the same.  He indicated that patient appeared weaker on the left side.  In about 20 to 30 minutes, her symptoms resolved.  She has not had any return of the symptoms since.  She does indicate that she has had 2 or 3 such episodes in the last 3 months.  From her previous stroke she has residual left-sided weakness and numbness and she is also having memory issues.  Additionally, review of system is positive for exertional shortness of breath for the last few months with some chest heaviness.  Patient has been taking all her medications as prescribed.  Home Medications Prior to Admission medications   Medication Sig Start Date End Date Taking? Authorizing Provider  aspirin EC 81 MG tablet Take 81 mg by mouth daily. Swallow whole.    [provider]  atorvastatin (LIPITOR) 80 MG tablet Take 80 mg by mouth daily.    [provider]  levothyroxine (SYNTHROID) 100 MCG tablet Take 50 mcg by mouth daily before breakfast.    [provider]  warfarin (COUMADIN) 2.5 MG tablet Take 2.5 mg by mouth daily.    [provider]      Allergies    Patient has no known allergies.    Review of Systems   Review of Systems  Constitutional:  Positive for activity  change.  Respiratory:  Positive for shortness of breath.   Cardiovascular:  Positive for chest pain.  Neurological:  Positive for facial asymmetry and numbness.  All other systems reviewed and are negative.  Physical Exam Updated Vital Signs BP 114/78    Pulse 67    Temp 97.6 F (36.4 C) (Oral)    Resp (!) 21    Ht 4' 9.5" (1.461 m)    Wt 48.5 kg    SpO2 100%    BMI 22.75 kg/m  Physical Exam Vitals and nursing note reviewed.  Constitutional:      Appearance: She is well-developed.  HENT:     Head: Atraumatic.  Eyes:     Comments: Left-sided gaze deficit for both eyes once patient's pupil is at midline  Cardiovascular:     Rate and Rhythm: Normal rate.  Pulmonary:     Effort: Pulmonary effort is normal.  Musculoskeletal:     Cervical back: Normal range of motion and neck supple.  Skin:    General: Skin is warm and dry.  Neurological:     Mental Status: She is alert and oriented to person, place, and time.     Comments: Upper and lower extremity weakness on the left side compared to right along with subjective numbness over the left side of the face, upper and lower extremities  compared to contralateral side.  Strength is 4 out of 5 for left side upper and lower extremity, 5 out of 5 for right side    ED Results / Procedures / Treatments   Labs (all labs ordered are listed, but only abnormal results are displayed) Labs Reviewed  BASIC METABOLIC PANEL - Abnormal; Notable for the following components:      Result Value   Glucose, Bld 119 (*)    All other components within normal limits  CBG MONITORING, ED - Abnormal; Notable for the following components:   Glucose-Capillary 128 (*)    All other components within normal limits  CBC WITH DIFFERENTIAL/PLATELET  PROTIME-INR  TROPONIN I (HIGH SENSITIVITY)  TROPONIN I (HIGH SENSITIVITY)    EKG None  Radiology DG Chest 2 View  Result Date: 12/08/2021 CLINICAL DATA:  Chest pain. Stroke this morning around 4 a.m. upon  waking up. Weakness. Unable to speak. EXAM: CHEST - 2 VIEW COMPARISON:  Chest two views 10/21/2021 FINDINGS: Cardiac silhouette is again mildly enlarged. Mediastinal contours are within normal limits. There is again increased lucency suggesting emphysematous change. Mild chronic bilateral mid and lower lung interstitial thickening appears similar to prior. No definite focal airspace opacity to indicate pneumonia. No pleural effusion or pneumothorax. Moderate multilevel degenerative disc changes of the midthoracic spine. IMPRESSION: Mild bilateral mid and lower lung interstitial thickening is similar to prior. This may represent mild interstitial pulmonary edema or viral infection in the acute setting. It may represent scarring in the chronic setting. Electronically Signed   By: Yvonne Kendall M.D.   On: 12/08/2021 18:34   CT HEAD WO CONTRAST (5MM)  Result Date: 12/08/2021 CLINICAL DATA:  Delirium EXAM: CT HEAD WITHOUT CONTRAST TECHNIQUE: Contiguous axial images were obtained from the base of the skull through the vertex without intravenous contrast. RADIATION DOSE REDUCTION: This exam was performed according to the departmental dose-optimization program which includes automated exposure control, adjustment of the mA and/or kV according to patient size and/or use of iterative reconstruction technique. COMPARISON:  None. FINDINGS: Brain: No acute territorial infarction, hemorrhage, or intracranial mass. Moderate encephalomalacia within the right frontal lobe consistent with remote infarct. Encephalomalacia involving right insula and sub insula. Nonenlarged ventricles. Vascular: No hyperdense vessels.  Carotid vascular calcification. Skull: Normal. Negative for fracture or focal lesion. Sinuses/Orbits: No acute finding. Other: None IMPRESSION: 1. No CT evidence for acute intracranial abnormality. 2. Remote right MCA infarct. Electronically Signed   By: Donavan Foil M.D.   On: 12/08/2021 17:18   MR BRAIN WO  CONTRAST  Result Date: 12/09/2021 CLINICAL DATA:  Provided history: Transient ischemic attack. EXAM: MRI HEAD WITHOUT CONTRAST TECHNIQUE: Multiplanar, multiecho pulse sequences of the brain and surrounding structures were obtained without intravenous contrast. COMPARISON:  Head CT 12/08/2021. FINDINGS: Brain: Redemonstrated moderate-sized chronic cortical/subcortical right MCA territory infarct involving portions of the right frontal and parietal lobes as well as posterior right insula. Mild chronic hemosiderin deposition at this site. Background cerebral volume appears normal for age. Mild multifocal T2 FLAIR hyperintense signal abnormality elsewhere within the cerebral white matter, nonspecific but compatible with chronic small vessel ischemic disease. This includes a small chronic lacunar infarct within left parietal white matter (series 11, image 16). There is no acute infarct. No evidence of an intracranial mass. No extra-axial fluid collection. No midline shift. Vascular: Maintained flow voids within the proximal large arterial vessels. Skull and upper cervical spine: No focal suspicious marrow lesion. Sinuses/Orbits: Visualized orbits show no acute finding. No significant paranasal sinus  disease. Other: Trace fluid within the right mastoid air cells. IMPRESSION: No evidence of acute intracranial abnormality. Moderate-sized chronic cortical/subcortical right MCA territory infarct. Background mild cerebral white matter chronic small vessel ischemic disease, including a small chronic lacunar infarct the left parietal white matter. Trace fluid within the right mastoid air cells. Electronically Signed   By: Kellie Simmering D.O.   On: 12/09/2021 11:55    Procedures Procedures    Medications Ordered in ED Medications  lactated ringers infusion (has no administration in time range)  iohexol (OMNIPAQUE) 350 MG/ML injection 75 mL (75 mLs Intravenous Contrast Given 12/09/21 1206)    ED Course/ Medical Decision  Making/ A&P                           Medical Decision Making Amount and/or Complexity of Data Reviewed Labs: ordered. Radiology: ordered. ECG/medicine tests: ordered.  Risk Prescription drug management. Decision regarding hospitalization.   This patient presents to the ED with chief complaint(s) of left-sided facial droop, and dysarthria with pertinent past medical history of previous stroke with residual left-sided weakness which further complicates the presenting complaint. The complaint involves an extensive differential diagnosis and treatment options and also carries with it a high risk of complications and morbidity.    Patient also has history of A-fib on Coumadin and CAD with stent placement.  She indicates having some exertional chest pain and shortness of breath on review of systems  The differential diagnosis for the left-sided symptoms includes  DDx includes Stroke - ischemic vs. hemorrhagic TIA Myelitis Electrolyte abnormality Neuropathy Muscular disease Reactivation of old stroke  Based on history and exam, suspicion is high for a small stroke that is undetectable on CT scan versus TIA.  Review of system is positive for exertional chest pain and shortness of breath.  Given her CAD history, ACS, stable angina, deconditioning are in the differential diagnosis.   Patient had arrived to the ER several hours back.  She had basic labs and CT scan of the brain completed. I have visualized CT scan independently, no evidence of brain bleed.  Radiologist read also reviewed, there is evidence of old stroke.  Patient's blood work is reassuring including delta troponins which are below institutional cutoff for myocardial injury.  Additional history obtained: Additional history obtained from family  Reassessment and review (also see workup area): Lab Tests: I Ordered, and personally interpreted labs.  The pertinent results include: Normal high-sensitivity troponin, normal  CBC, normal metabolic profile.  Imaging Studies: I ordered and independently visualized and interpreted the following imaging CT scan of the brain   which showed no acute bleed upon my visualization. The interpretation of the imaging was limited to assessing for emergent pathology, for which purpose it was ordered.  Consultations Obtained: I requested consultation with the consultant neurologist , and discussed  findings as well as pertinent plan - they recommend: Admitting the patient for TIA versus stroke after seeing the patient.    Reevaluation of the patient after these medicines showed that the patient    stayed the same  Social Determinants of Health: Patients  language barrier, poor social support when son is at work, and being new to the area, not established with any specialist   increases the complexity of managing their presentation.   Cardiac Monitoring: The patient was maintained on a cardiac monitor.  I personally viewed and interpreted the cardiac monitor which showed an underlying rhythm of:  Atrial Fibrillation  Complexity of problems addressed: Patients presentation is most consistent with  acute illness/injury with systemic symptoms During patient's assessment  Disposition: After consideration of the diagnostic results and the patients response to treatment,  I feel that the patent would benefit from admission   .   Final Clinical Impression(s) / ED Diagnoses Final diagnoses:  TIA (transient ischemic attack)    Rx / DC Orders ED Discharge Orders     None         Varney Biles, MD 12/09/21 1252

## 2021-12-09 NOTE — Assessment & Plan Note (Addendum)
Presumed to be chronic. Rate is controlled.  - Continue coumadin - Not on rate control medication at home, though cardiology recommends keeping rate low, started metoprolol. Will monitor on telemetry.

## 2021-12-09 NOTE — Evaluation (Signed)
Physical Therapy Evaluation Patient Details Name: Christy Poole MRN: [id number] DOB: 1961-05-27 Today's Date: 12/09/2021  History of Present Illness  61 y.o. female presents to [facility] hospital on 12/08/2021 with L weakness and facial droop.CT and MRI head negative for acute CVA. PMH includes CVA with residual L weakness, Afib, CAD.  Clinical Impression  Pt presents with deficits in strength, sensation, functional mobility, gait, balance, endurance. Pt reports L sided numbness however consistently reports she is unable to feel PT touch her left side when PT is touching that side and provides no reports when tactile stimulation is not provided. Pt is able to mobilize L side against gravity and demonstrates no L knee buckling during ambulation. Pt reports feeling fatigue and son reports pt often reports dizziness when mobilizing, with a history of multiple falls with LOC. PT will attempt to assess orthostatic BP next session to determine if this is affecting her activity tolerance. PT encourages the pt to attempt to do as much as she is able to prior to asking for assistance from staff/family in an effort to preserve her mobility and strength. PT provides recommendation of utilizing a walker for short bouts of ambulation and a transport chair for longer distances to improve patient safety.       Recommendations for follow up therapy are one component of a multi-disciplinary discharge planning process, led by the attending physician.  Recommendations may be updated based on patient status, additional functional criteria and insurance authorization.  Follow Up Recommendations Home health PT (outpatient PT if HHPT is not covered)    Assistance Recommended at Discharge Frequent or constant Supervision/Assistance  Patient can return home with the following  A little help with walking and/or transfers;A little help with bathing/dressing/bathroom;Assistance with cooking/housework;Assistance with feeding;Direct  supervision/assist for medications management;Direct supervision/assist for financial management;Assist for transportation;Help with stairs or ramp for entrance    Equipment Recommendations Rolling walker (2 wheels);BSC/3in1 (transport chair)  Recommendations for Other Services       Functional Status Assessment Patient has had a recent decline in their functional status and demonstrates the ability to make significant improvements in function in a reasonable and predictable amount of time.     Precautions / Restrictions Precautions Precautions: Fall Precaution Comments: pre-syncope? reports of multiple prior falls, some with loss of consciousness Restrictions Weight Bearing Restrictions: No      Mobility  Bed Mobility Overal bed mobility: Needs Assistance Bed Mobility: Supine to Sit, Sit to Supine     Supine to sit: Supervision Sit to supine: Supervision   General bed mobility comments: increased time    Transfers Overall transfer level: Needs assistance Equipment used: 1 person hand held assist, Rolling walker (2 wheels) Transfers: Sit to/from Stand Sit to Stand: Min assist, Min guard           General transfer comment: minA without device, minG with RW    Ambulation/Gait Ambulation/Gait assistance: Min guard Gait Distance (Feet): 10 Feet Assistive device: Rolling walker (2 wheels) Gait Pattern/deviations: Step-to pattern Gait velocity: reduced Gait velocity interpretation: <1.31 ft/sec, indicative of household ambulator   General Gait Details: pt with slowed step-to gait, distances limited by reports of fatigue. Cues to maintain RW closer to BOS  Stairs            Wheelchair Mobility    Modified Rankin (Stroke Patients Only)       Balance Overall balance assessment: Needs assistance Sitting-balance support: No upper extremity supported, Feet supported Sitting balance-Leahy Scale: Fair  Standing balance support: Single extremity supported,  Bilateral upper extremity supported, Reliant on assistive device for balance Standing balance-Leahy Scale: Poor                               Pertinent Vitals/Pain Pain Assessment Pain Assessment: Faces Faces Pain Scale: Hurts little more Pain Location: L shoulder and palm Pain Descriptors / Indicators: Aching Pain Intervention(s): Monitored during session    Home Living Family/patient expects to be discharged to:: Private residence Living Arrangements: Children Available Help at Discharge: Family;Available PRN/intermittently Type of Home: House Home Access: Level entry     Alternate Level Stairs-Number of Steps: 15 Home Layout: Two level Home Equipment: None      Prior Function Prior Level of Function : Needs assist       Physical Assist : Mobility (physical);ADLs (physical) Mobility (physical): Gait   Mobility Comments: pt ambulates for short household distances, son estimates 25 steps at a time, at home. Pt often reports weakness and fatigue, intermittent reports of dizziness ADLs Comments: son reports the pt often requires assistance for ADLs and tasks. Pt's son utilizes example of pt not being able to buckle her seat belt. Pt's son also reports the pt has difficulty holding metal objects specifically, regardless of weight     Hand Dominance        Extremity/Trunk Assessment   Upper Extremity Assessment Upper Extremity Assessment: LUE deficits/detail LUE Deficits / Details: grossly 4-/5, pt moving LUE slowly but able to hold up against gravity. Pt reports being unable to feel when PT touches LUE although eyes are closed during sensation testing and pt reports no only when PT is touching the patient    Lower Extremity Assessment Lower Extremity Assessment: LLE deficits/detail LLE Deficits / Details: grossly 4/5, pt reports numbness in LLE however reports no only when pt is touching patient, similar to LUE    Cervical / Trunk Assessment Cervical /  Trunk Assessment: Normal  Communication   Communication: Prefers language other than English (would recommend not utilizing son for interpretation, often speaking for patient)  Cognition Arousal/Alertness: Awake/alert Behavior During Therapy: WFL for tasks assessed/performed Overall Cognitive Status: Difficult to assess                                 General Comments: Pt's son offers to interpret but during session does not seem to be providing direct interpretation of pt's thoughts. Often speaking over pt. Difficult to accurately assess cognition at this time.        General Comments General comments (skin integrity, edema, etc.): HR ranging from 80-100s during session, pt reports fatigue quickly as well as difficulty breathing although no increased work of breathing noted during session. SpO2 97% when checked after pt report when resting in bed.    Exercises     Assessment/Plan    PT Assessment Patient needs continued PT services  PT Problem List Decreased strength;Decreased activity tolerance;Decreased balance;Decreased mobility;Decreased cognition;Cardiopulmonary status limiting activity;Decreased knowledge of use of DME;Impaired sensation       PT Treatment Interventions DME instruction;Gait training;Stair training;Functional mobility training;Therapeutic activities;Balance training;Therapeutic exercise;Neuromuscular re-education;Patient/family education    PT Goals (Current goals can be found in the Care Plan section)  Acute Rehab PT Goals Patient Stated Goal: to make pt more confident in ambulation PT Goal Formulation: With patient/family Time For Goal Achievement: 12/23/21 Potential to Achieve Goals:  Fair    Frequency Min 4X/week     Co-evaluation               AM-PAC PT "6 Clicks" Mobility  Outcome Measure Help needed turning from your back to your side while in a flat bed without using bedrails?: A Little Help needed moving from lying on your  back to sitting on the side of a flat bed without using bedrails?: A Little Help needed moving to and from a bed to a chair (including a wheelchair)?: A Little Help needed standing up from a chair using your arms (e.g., wheelchair or bedside chair)?: A Little Help needed to walk in hospital room?: Total Help needed climbing 3-5 steps with a railing? : Total 6 Click Score: 14    End of Session   Activity Tolerance: Patient limited by fatigue Patient left: in bed;with call bell/phone within reach;with bed alarm set;with family/visitor present Nurse Communication: Mobility status PT Visit Diagnosis: Other abnormalities of gait and mobility (R26.89)    Time: [id number] PT Time Calculation (min) (ACUTE ONLY): 51 min   Charges:   PT Evaluation $PT Eval Low Complexity: 1 Low PT Treatments $Self Care/Home Management: 8-22        Zenaida Niece, PT, DPT Acute Rehabilitation Pager: [phone] Office [phone]   Zenaida Niece 12/09/2021, 6:00 PM

## 2021-12-09 NOTE — Plan of Care (Signed)

## 2021-12-09 NOTE — Consult Note (Signed)
Stroke Neurology Consultation Note  Consult Requested by: Dr. Kathrynn Humble ER MD  Reason for Consult: stroke  Consult Date: 12/09/21   The history was obtained from the pt and son.  During history and examination, all items were  able to obtain unless otherwise noted.  History of Present Illness:  Christy Poole is a 61 y.o. [place] female with PMH of coronary disease status post stent in 2016.  She has a history of stroke in the past.  She woke up yesterday around 4 in the morning noted to have worsening weakness on the left side as well as facial droop and difficulty speaking.  It seems to be intermittent and had about 2-3 episodes.  This most recent episode she is not back to her baseline.  She also complains of chest pain and difficulty breathing when she walks.  She is new to the area and came down from [place] to be with her son.  She does not have a primary care set up.  She has been in the ER since 3 in the morning since the episode started.  She has not taken her Coumadin medication.  She is has been under some stress lately due to to family matters.  Last known normal 4 AM yesterday morning tPA Given: No: Not a candidate as she is out of the window for intervention  Past Medical History:  Diagnosis Date   Atrial fibrillation ([place])    Diabetes mellitus without complication ([place])    Stroke [facility])    Thyroid disease     No past surgical history on file.  No family history on file.  Social History:  reports that she has never smoked. She has never used smokeless tobacco. She reports that she does not drink alcohol and does not use drugs.  Allergies: No Known Allergies  No current facility-administered medications on file prior to encounter.   Current Outpatient Medications on File Prior to Encounter  Medication Sig Dispense Refill   acetaminophen (TYLENOL) 500 MG tablet Take 1,000 mg by mouth every 6 (six) hours as needed for mild pain, headache or fever.     aspirin EC 81 MG  tablet Take 81 mg by mouth daily. Swallow whole.     atorvastatin (LIPITOR) 80 MG tablet Take 80 mg by mouth daily.     levothyroxine (SYNTHROID) 50 MCG tablet Take 50 mcg by mouth at bedtime.     warfarin (COUMADIN) 2.5 MG tablet Take 5 mg by mouth at bedtime.      Review of Systems: A full ROS was attempted today and was  able to be performed.  Systems assessed include - Constitutional, Eyes, HENT, Respiratory, Cardiovascular, Gastrointestinal, Genitourinary, Integument/breast, Hematologic/lymphatic, Musculoskeletal, Neurological, Behavioral/Psych, Endocrine, Allergic/Immunologic - with pertinent responses as per HPI.  Physical Examination: Temp:  [97.4 F (36.3 C)-98.4 F (36.9 C)] 97.9 F (36.6 C) (02/16 1230) Pulse Rate:  [62-93] 84 (02/16 1230) Resp:  [16-21] 19 (02/16 1230) BP: (114-169)/(65-105) 148/78 (02/16 1230) SpO2:  [98 %-100 %] 100 % (02/16 1230) Weight:  [48.5 kg] 48.5 kg (02/15 1633)  General - well nourished, well developed, in no apparent distress.    Ophthalmologic - fundi not visualized due to noncooperation.    Cardiovascular - regular rhythm and rate  Mental Status -  Level of arousal and orientation to time, place, and person were intact. No aphasia  She has poor attention and delayed recall.  Cranial Nerves II - XII - II - Vision intact OU. III, IV, VI -  Extraocular movements intact. V - Facial sensation intact bilaterally. VII - Facial movement intact bilaterally. VIII - Hearing & vestibular intact bilaterally. X - Palate elevates symmetrically. XI - Chin turning & shoulder shrug intact bilaterally. XII - Tongue protrusion intact.  Motor Strength -right upper and lower extremity pretty normal.  She appears to have weakness and drift in the left upper and lower extremity with some increased tone.  Reflexes - The patients reflexes were normal in all extremities and she had no pathological reflexes.  Sensory - Light touch, temperature/pinprick were  assessed and were normal.    Coordination - The patient had normal movements in the hands and feet with no ataxia or dysmetria.  Tremor was absent.  Gait and Station - deferred  Data Reviewed: CT ANGIO HEAD NECK W WO CM  Result Date: 12/09/2021 CLINICAL DATA:  Altered mental status, chest pain, weakness EXAM: CT ANGIOGRAPHY HEAD AND NECK TECHNIQUE: Multidetector CT imaging of the head and neck was performed using the standard protocol during bolus administration of intravenous contrast. Multiplanar CT image reconstructions and MIPs were obtained to evaluate the vascular anatomy. Carotid stenosis measurements (when applicable) are obtained utilizing NASCET criteria, using the distal internal carotid diameter as the denominator. RADIATION DOSE REDUCTION: This exam was performed according to the departmental dose-optimization program which includes automated exposure control, adjustment of the mA and/or kV according to patient size and/or use of iterative reconstruction technique. CONTRAST:  43mL OMNIPAQUE IOHEXOL 350 MG/ML SOLN COMPARISON:  Same day brain MRI, noncontrast CT head dated 1 day prior FINDINGS: CT HEAD FINDINGS Brain: There is no evidence of acute intracranial hemorrhage, extra-axial fluid collection, or acute infarct. The remote infarct in the right MCA distribution is again seen. There is unchanged ex vacuo dilatation of the right lateral ventricle. The ventricles are otherwise normal in size and appearance. The background parenchymal volume is otherwise normal. There is no mass lesion.  There is no midline shift. Vascular: See below Skull: Normal. Negative for fracture or focal lesion. Sinuses and orbits: The paranasal sinuses are clear. The globes and orbits are unremarkable. Other: None. Review of the MIP images confirms the above findings CTA NECK FINDINGS Aortic arch: There is mild calcified atherosclerotic plaque in the aortic arch. The origins of the major branch vessels are patent. The  subclavian arteries are patent with non flow limiting plaque just after the takeoff of the common carotid artery on the right. Right carotid system: The right common, internal, and external carotid arteries are patent, without hemodynamically significant stenosis or occlusion. There is no dissection or aneurysm. Left carotid system: There is scattered mild calcified plaque in the left common carotid artery without hemodynamically significant stenosis. The left internal and external carotid arteries are patent, without hemodynamically significant stenosis or occlusion. There is no dissection or aneurysm. Vertebral arteries: The vertebral arteries are patent, without hemodynamically significant stenosis or occlusion. There is no dissection or aneurysm. Skeleton: There is mild degenerative change of the cervical spine at C4-C5. There is no acute osseous abnormality or aggressive osseous lesion. There is no visible canal hematoma. Other neck: The soft tissues are unremarkable. Upper chest: There is patchy geographic ground-glass opacity in the lung apices which may reflect air trapping or multifocal infection. There is a 9 mm enhancing lesion in the AP window. Review of the MIP images confirms the above findings CTA HEAD FINDINGS Anterior circulation: There is mild calcified plaque in the intracranial ICAs without hemodynamically significant stenosis or occlusion. The bilateral MCAs are  patent. The bilateral ACAs are patent. The anterior communicating artery is normal. There is no aneurysm or AVM. Posterior circulation: The bilateral V4 segments are patent. PICA is identified bilaterally. The basilar artery is patent. The bilateral PCAs are patent. A small right posterior communicating artery is identified. The left posterior communicating artery is not definitely seen. There is no aneurysm or AVM. Venous sinuses: Patent. Anatomic variants: None. Review of the MIP images confirms the above findings IMPRESSION: 1. No  acute intracranial pathology. Unchanged remote right MCA territory infarct. 2. Patent vasculature of the head and neck. 3. Mosaic attenuation in the lung apices suggestive of air trapping/small airway disease. 4. Probable lymphadenopathy in the AP window. Recommend contrast enhanced CT of the chest for further evaluation. This may be performed on a nonemergent basis as indicated. Electronically Signed   By: Valetta Mole M.D.   On: 12/09/2021 12:27   DG Chest 2 View  Result Date: 12/08/2021 CLINICAL DATA:  Chest pain. Stroke this morning around 4 a.m. upon waking up. Weakness. Unable to speak. EXAM: CHEST - 2 VIEW COMPARISON:  Chest two views 10/21/2021 FINDINGS: Cardiac silhouette is again mildly enlarged. Mediastinal contours are within normal limits. There is again increased lucency suggesting emphysematous change. Mild chronic bilateral mid and lower lung interstitial thickening appears similar to prior. No definite focal airspace opacity to indicate pneumonia. No pleural effusion or pneumothorax. Moderate multilevel degenerative disc changes of the midthoracic spine. IMPRESSION: Mild bilateral mid and lower lung interstitial thickening is similar to prior. This may represent mild interstitial pulmonary edema or viral infection in the acute setting. It may represent scarring in the chronic setting. Electronically Signed   By: Yvonne Kendall M.D.   On: 12/08/2021 18:34   CT HEAD WO CONTRAST (5MM)  Result Date: 12/08/2021 CLINICAL DATA:  Delirium EXAM: CT HEAD WITHOUT CONTRAST TECHNIQUE: Contiguous axial images were obtained from the base of the skull through the vertex without intravenous contrast. RADIATION DOSE REDUCTION: This exam was performed according to the departmental dose-optimization program which includes automated exposure control, adjustment of the mA and/or kV according to patient size and/or use of iterative reconstruction technique. COMPARISON:  None. FINDINGS: Brain: No acute territorial  infarction, hemorrhage, or intracranial mass. Moderate encephalomalacia within the right frontal lobe consistent with remote infarct. Encephalomalacia involving right insula and sub insula. Nonenlarged ventricles. Vascular: No hyperdense vessels.  Carotid vascular calcification. Skull: Normal. Negative for fracture or focal lesion. Sinuses/Orbits: No acute finding. Other: None IMPRESSION: 1. No CT evidence for acute intracranial abnormality. 2. Remote right MCA infarct. Electronically Signed   By: Donavan Foil M.D.   On: 12/08/2021 17:18   MR BRAIN WO CONTRAST  Result Date: 12/09/2021 CLINICAL DATA:  Provided history: Transient ischemic attack. EXAM: MRI HEAD WITHOUT CONTRAST TECHNIQUE: Multiplanar, multiecho pulse sequences of the brain and surrounding structures were obtained without intravenous contrast. COMPARISON:  Head CT 12/08/2021. FINDINGS: Brain: Redemonstrated moderate-sized chronic cortical/subcortical right MCA territory infarct involving portions of the right frontal and parietal lobes as well as posterior right insula. Mild chronic hemosiderin deposition at this site. Background cerebral volume appears normal for age. Mild multifocal T2 FLAIR hyperintense signal abnormality elsewhere within the cerebral white matter, nonspecific but compatible with chronic small vessel ischemic disease. This includes a small chronic lacunar infarct within left parietal white matter (series 11, image 16). There is no acute infarct. No evidence of an intracranial mass. No extra-axial fluid collection. No midline shift. Vascular: Maintained flow voids within the proximal large  arterial vessels. Skull and upper cervical spine: No focal suspicious marrow lesion. Sinuses/Orbits: Visualized orbits show no acute finding. No significant paranasal sinus disease. Other: Trace fluid within the right mastoid air cells. IMPRESSION: No evidence of acute intracranial abnormality. Moderate-sized chronic cortical/subcortical right  MCA territory infarct. Background mild cerebral white matter chronic small vessel ischemic disease, including a small chronic lacunar infarct the left parietal white matter. Trace fluid within the right mastoid air cells. Electronically Signed   By: Kellie Simmering D.O.   On: 12/09/2021 11:55    Assessment: 62 y.o. female with history of coronary disease, diabetes, atrial fibrillation on Coumadin and new onset intermittent symptoms of left-sided weakness and trouble getting her words out.  Concern for possible stuttering TIA versus recrudescence of old CVA.  Stroke Risk Factors - atrial fibrillation, diabetes mellitus, and hypertension  Chest pain: Defer to primary for work-up  Shortness of breath: Defer to primary for work-up  Plan: - HgbA1c, fasting lipid panel - MRI without contrast -CTA head and neck to evaluate for critical stenosis or mobile thrombus. - PT consult, OT consult, Speech consult - Echocardiogram - Prophylactic therapy-Antiplatelet med: Aspirin - dose 81 mg and Coumadin.  If she has another stroke on MRI consider newer anticoagulation medication however cost may be an issue.  Consulting transition of care team would be helpful. - Risk factor modification - Telemetry monitoring - Frequent neuro checks  - Stroke team to follow   Thank you for this consultation and allowing [place] to participate in the care of this patient.

## 2021-12-10 DIAGNOSIS — Z8673 Personal history of transient ischemic attack (TIA), and cerebral infarction without residual deficits: Secondary | ICD-10-CM | POA: Diagnosis not present

## 2021-12-10 DIAGNOSIS — I4821 Permanent atrial fibrillation: Secondary | ICD-10-CM | POA: Diagnosis not present

## 2021-12-10 DIAGNOSIS — E785 Hyperlipidemia, unspecified: Secondary | ICD-10-CM | POA: Diagnosis not present

## 2021-12-10 DIAGNOSIS — R29898 Other symptoms and signs involving the musculoskeletal system: Secondary | ICD-10-CM | POA: Diagnosis not present

## 2021-12-10 DIAGNOSIS — D696 Thrombocytopenia, unspecified: Secondary | ICD-10-CM

## 2021-12-10 DIAGNOSIS — G459 Transient cerebral ischemic attack, unspecified: Secondary | ICD-10-CM | POA: Diagnosis not present

## 2021-12-10 DIAGNOSIS — E119 Type 2 diabetes mellitus without complications: Secondary | ICD-10-CM | POA: Diagnosis not present

## 2021-12-10 DIAGNOSIS — I050 Rheumatic mitral stenosis: Secondary | ICD-10-CM | POA: Diagnosis present

## 2021-12-10 LAB — PROTIME-INR
INR: 2.2 — ABNORMAL HIGH (ref 0.8–1.2)
Prothrombin Time: 24.8 seconds — ABNORMAL HIGH (ref 11.4–15.2)

## 2021-12-10 LAB — CBC
HCT: 42.1 % (ref 36.0–46.0)
Hemoglobin: 13.6 g/dL (ref 12.0–15.0)
MCH: 28.5 pg (ref 26.0–34.0)
MCHC: 32.3 g/dL (ref 30.0–36.0)
MCV: 88.1 fL (ref 80.0–100.0)
Platelets: 138 10*3/uL — ABNORMAL LOW (ref 150–400)
RBC: 4.78 MIL/uL (ref 3.87–5.11)
RDW: 13.3 % (ref 11.5–15.5)
WBC: 5.6 10*3/uL (ref 4.0–10.5)
nRBC: 0 % (ref 0.0–0.2)

## 2021-12-10 LAB — HIV ANTIBODY (ROUTINE TESTING W REFLEX): HIV Screen 4th Generation wRfx: NONREACTIVE

## 2021-12-10 MED ORDER — METOPROLOL SUCCINATE ER 25 MG PO TB24
25.0000 mg | ORAL_TABLET | Freq: Every day | ORAL | Status: DC
  Administered 2021-12-11: 25 mg via ORAL
  Filled 2021-12-10: qty 1

## 2021-12-10 MED ORDER — WARFARIN SODIUM 5 MG PO TABS
5.0000 mg | ORAL_TABLET | Freq: Once | ORAL | Status: AC
  Administered 2021-12-10: 5 mg via ORAL
  Filled 2021-12-10: qty 1

## 2021-12-10 NOTE — Progress Notes (Signed)
STROKE TEAM PROGRESS NOTE   SUBJECTIVE (INTERVAL HISTORY) Her son is at the bedside.  Overall her condition is rapidly improving. She is sitting in chair, son as interpreter, able to follow simple commands but psychomotor slowing. Still has mild left hand weakness and facial droop but chronic from prior stroke. 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 Compliant with medication per son.    OBJECTIVE Temp:  [97.4 F (36.3 C)-98.4 F (36.9 C)] 97.8 F (36.6 C) (02/17 1945) Pulse Rate:  [61-97] 97 (02/17 1945) Cardiac Rhythm: Atrial fibrillation (02/17 0738) Resp:  [14-20] 18 (02/17 1945) BP: (106-141)/(67-86) 121/71 (02/17 1945) SpO2:  [96 %-99 %] 98 % (02/17 1945)  Recent Labs  Lab 12/09/21 0007  GLUCAP 128*   Recent Labs  Lab 12/08/21 1657  NA 138  K 4.3  CL 107  CO2 23  GLUCOSE 119*  BUN 15  CREATININE 0.80  CALCIUM 9.4   No results for input(s): AST, ALT, ALKPHOS, BILITOT, PROT, ALBUMIN in the last 168 hours. Recent Labs  Lab 12/08/21 1657 12/10/21 0824  WBC 6.8 5.6  NEUTROABS 3.8  --   HGB 13.6 13.6  HCT 42.6 42.1  MCV 88.6 88.1  PLT 169 138*   No results for input(s): CKTOTAL, CKMB, CKMBINDEX, TROPONINI in the last 168 hours. Recent Labs    12/09/21 1233 12/10/21 0824  LABPROT 24.7* 24.8*  INR 2.2* 2.2*   No results for input(s): COLORURINE, LABSPEC, PHURINE, GLUCOSEU, HGBUR, BILIRUBINUR, KETONESUR, PROTEINUR, UROBILINOGEN, NITRITE, LEUKOCYTESUR in the last 72 hours.  Invalid input(s): APPERANCEUR     Component Value Date/Time   CHOL 104 12/01/2021 0920   TRIG 84 12/01/2021 0920   HDL 51 12/01/2021 0920   CHOLHDL 2.0 12/01/2021 0920   LDLCALC 36 12/01/2021 0920   Lab Results  Component Value Date   HGBA1C 6.6 (H) 12/01/2021   No results found for: LABOPIA, COCAINSCRNUR, LABBENZ, AMPHETMU, THCU, LABBARB  No results for input(s): ETH in the last 168 hours.  I have personally reviewed the radiological images below and agree with  the radiology interpretations.  CT ANGIO HEAD NECK W WO CM  Result Date: 12/09/2021 CLINICAL DATA:  Altered mental status, chest pain, weakness EXAM: CT ANGIOGRAPHY HEAD AND NECK TECHNIQUE: Multidetector CT imaging of the head and neck was performed using the standard protocol during bolus administration of intravenous contrast. Multiplanar CT image reconstructions and MIPs were obtained to evaluate the vascular anatomy. Carotid stenosis measurements (when applicable) are obtained utilizing NASCET criteria, using the distal internal carotid diameter as the denominator. RADIATION DOSE REDUCTION: This exam was performed according to the departmental dose-optimization program which includes automated exposure control, adjustment of the mA and/or kV according to patient size and/or use of iterative reconstruction technique. CONTRAST:  80mL OMNIPAQUE IOHEXOL 350 MG/ML SOLN COMPARISON:  Same day brain MRI, noncontrast CT head dated 1 day prior FINDINGS: CT HEAD FINDINGS Brain: There is no evidence of acute intracranial hemorrhage, extra-axial fluid collection, or acute infarct. The remote infarct in the right MCA distribution is again seen. There is unchanged ex vacuo dilatation of the right lateral ventricle. The ventricles are otherwise normal in size and appearance. The background parenchymal volume is otherwise normal. There is no mass lesion.  There is no midline shift. Vascular: See below Skull: Normal. Negative for fracture or focal lesion. Sinuses and orbits: The paranasal sinuses are clear. The globes and orbits are unremarkable. Other: None. Review of the MIP images confirms the above findings CTA NECK  FINDINGS Aortic arch: There is mild calcified atherosclerotic plaque in the aortic arch. The origins of the major branch vessels are patent. The subclavian arteries are patent with non flow limiting plaque just after the takeoff of the common carotid artery on the right. Right carotid system: The right common,  internal, and external carotid arteries are patent, without hemodynamically significant stenosis or occlusion. There is no dissection or aneurysm. Left carotid system: There is scattered mild calcified plaque in the left common carotid artery without hemodynamically significant stenosis. The left internal and external carotid arteries are patent, without hemodynamically significant stenosis or occlusion. There is no dissection or aneurysm. Vertebral arteries: The vertebral arteries are patent, without hemodynamically significant stenosis or occlusion. There is no dissection or aneurysm. Skeleton: There is mild degenerative change of the cervical spine at C4-C5. There is no acute osseous abnormality or aggressive osseous lesion. There is no visible canal hematoma. Other neck: The soft tissues are unremarkable. Upper chest: There is patchy geographic ground-glass opacity in the lung apices which may reflect air trapping or multifocal infection. There is a 9 mm enhancing lesion in the AP window. Review of the MIP images confirms the above findings CTA HEAD FINDINGS Anterior circulation: There is mild calcified plaque in the intracranial ICAs without hemodynamically significant stenosis or occlusion. The bilateral MCAs are patent. The bilateral ACAs are patent. The anterior communicating artery is normal. There is no aneurysm or AVM. Posterior circulation: The bilateral V4 segments are patent. PICA is identified bilaterally. The basilar artery is patent. The bilateral PCAs are patent. A small right posterior communicating artery is identified. The left posterior communicating artery is not definitely seen. There is no aneurysm or AVM. Venous sinuses: Patent. Anatomic variants: None. Review of the MIP images confirms the above findings IMPRESSION: 1. No acute intracranial pathology. Unchanged remote right MCA territory infarct. 2. Patent vasculature of the head and neck. 3. Mosaic attenuation in the lung apices  suggestive of air trapping/small airway disease. 4. Probable lymphadenopathy in the AP window. Recommend contrast enhanced CT of the chest for further evaluation. This may be performed on a nonemergent basis as indicated. Electronically Signed   By: Valetta Mole M.D.   On: 12/09/2021 12:27   DG Chest 2 View  Result Date: 12/08/2021 CLINICAL DATA:  Chest pain. Stroke this morning around 4 a.m. upon waking up. Weakness. Unable to speak. EXAM: CHEST - 2 VIEW COMPARISON:  Chest two views 10/21/2021 FINDINGS: Cardiac silhouette is again mildly enlarged. Mediastinal contours are within normal limits. There is again increased lucency suggesting emphysematous change. Mild chronic bilateral mid and lower lung interstitial thickening appears similar to prior. No definite focal airspace opacity to indicate pneumonia. No pleural effusion or pneumothorax. Moderate multilevel degenerative disc changes of the midthoracic spine. IMPRESSION: Mild bilateral mid and lower lung interstitial thickening is similar to prior. This may represent mild interstitial pulmonary edema or viral infection in the acute setting. It may represent scarring in the chronic setting. Electronically Signed   By: Yvonne Kendall M.D.   On: 12/08/2021 18:34   CT HEAD WO CONTRAST (5MM)  Result Date: 12/08/2021 CLINICAL DATA:  Delirium EXAM: CT HEAD WITHOUT CONTRAST TECHNIQUE: Contiguous axial images were obtained from the base of the skull through the vertex without intravenous contrast. RADIATION DOSE REDUCTION: This exam was performed according to the departmental dose-optimization program which includes automated exposure control, adjustment of the mA and/or kV according to patient size and/or use of iterative reconstruction technique. COMPARISON:  None.  FINDINGS: Brain: No acute territorial infarction, hemorrhage, or intracranial mass. Moderate encephalomalacia within the right frontal lobe consistent with remote infarct. Encephalomalacia involving  right insula and sub insula. Nonenlarged ventricles. Vascular: No hyperdense vessels.  Carotid vascular calcification. Skull: Normal. Negative for fracture or focal lesion. Sinuses/Orbits: No acute finding. Other: None IMPRESSION: 1. No CT evidence for acute intracranial abnormality. 2. Remote right MCA infarct. Electronically Signed   By: Donavan Foil M.D.   On: 12/08/2021 17:18   MR BRAIN WO CONTRAST  Result Date: 12/09/2021 CLINICAL DATA:  Provided history: Transient ischemic attack. EXAM: MRI HEAD WITHOUT CONTRAST TECHNIQUE: Multiplanar, multiecho pulse sequences of the brain and surrounding structures were obtained without intravenous contrast. COMPARISON:  Head CT 12/08/2021. FINDINGS: Brain: Redemonstrated moderate-sized chronic cortical/subcortical right MCA territory infarct involving portions of the right frontal and parietal lobes as well as posterior right insula. Mild chronic hemosiderin deposition at this site. Background cerebral volume appears normal for age. Mild multifocal T2 FLAIR hyperintense signal abnormality elsewhere within the cerebral white matter, nonspecific but compatible with chronic small vessel ischemic disease. This includes a small chronic lacunar infarct within left parietal white matter (series 11, image 16). There is no acute infarct. No evidence of an intracranial mass. No extra-axial fluid collection. No midline shift. Vascular: Maintained flow voids within the proximal large arterial vessels. Skull and upper cervical spine: No focal suspicious marrow lesion. Sinuses/Orbits: Visualized orbits show no acute finding. No significant paranasal sinus disease. Other: Trace fluid within the right mastoid air cells. IMPRESSION: No evidence of acute intracranial abnormality. Moderate-sized chronic cortical/subcortical right MCA territory infarct. Background mild cerebral white matter chronic small vessel ischemic disease, including a small chronic lacunar infarct the left parietal  white matter. Trace fluid within the right mastoid air cells. Electronically Signed   By: Kellie Simmering D.O.   On: 12/09/2021 11:55   ECHOCARDIOGRAM COMPLETE  Result Date: 12/09/2021    ECHOCARDIOGRAM REPORT   Patient Name:   LAPRINCIA RAILSBACK Date of Exam: 12/09/2021 Medical Rec #:  [id number]    Height:       57.5 in Accession #:    [id number]   Weight:       107.0 lb Date of Birth:  Jan 23, 1961    BSA:          1.388 m Patient Age:    16 years     BP:           152/87 mmHg Patient Gender: F            HR:           83 bpm. Exam Location:  Inpatient Procedure: Limited Echo, Cardiac Doppler and Color Doppler Indications:    CVA  History:        Patient has no prior history of Echocardiogram examinations.                 Stroke, Arrythmias:Atrial Fibrillation; Risk Factors:Diabetes.  Sonographer:    Luisa Hart RDCS Referring Phys: [place]  1. There is severe, calcific mitral stenosis with average mean gradient 61mmHg at HR 67. MVA by VTI 1.0cm2.  2. Left ventricular ejection fraction, by estimation, is 45 to 50%. The left ventricle has mildly decreased function. Left ventricular endocardial border not optimally defined to evaluate regional wall motion. Diastolic function is indeterminant due to Afib.  3. Right ventricular systolic function is normal. The right ventricular size is normal.  4. Left atrial size was severely dilated.  5. Right atrial size was moderately dilated.  6. The mitral valve is abnormal. Mild mitral valve regurgitation. Severe mitral stenosis. The mean mitral valve gradient is 8.5 mmHg. Severe mitral annular calcification.  7. The aortic valve is tricuspid. There is mild calcification of the aortic valve. There is mild thickening of the aortic valve. Aortic valve regurgitation is not visualized. Aortic valve sclerosis/calcification is present, without any evidence of aortic stenosis.  8. The inferior vena cava is normal in size with greater than 50% respiratory variability,  suggesting right atrial pressure of 3 mmHg. Comparison(s): No prior Echocardiogram. Conclusion(s)/Recommendation(s): No intracardiac source of embolism detected on this transthoracic study. Consider a transesophageal echocardiogram to exclude cardiac source of embolism if clinically indicated. FINDINGS  Left Ventricle: Left ventricular ejection fraction, by estimation, is 45 to 50%. The left ventricle has mildly decreased function. Left ventricular endocardial border not optimally defined to evaluate regional wall motion. The left ventricular internal cavity size was normal in size. There is no left ventricular hypertrophy. Diastolic function is indeterminant due to Afib. Right Ventricle: The right ventricular size is normal. No increase in right ventricular wall thickness. Right ventricular systolic function is normal. Left Atrium: Left atrial size was severely dilated. Right Atrium: Right atrial size was moderately dilated. Pericardium: There is no evidence of pericardial effusion. Mitral Valve: MVA by VTI 1.0cm2. The mitral valve is abnormal. There is moderate thickening of the mitral valve leaflet(s). There is moderate calcification of the mitral valve leaflet(s). Severe mitral annular calcification. Mild mitral valve regurgitation. Severe mitral valve stenosis. MV peak gradient, 14.7 mmHg. The mean mitral valve gradient is 8.5 mmHg. Tricuspid Valve: The tricuspid valve is normal in structure. Tricuspid valve regurgitation is trivial. Aortic Valve: The aortic valve is tricuspid. There is mild calcification of the aortic valve. There is mild thickening of the aortic valve. Aortic valve regurgitation is not visualized. Aortic valve sclerosis/calcification is present, without any evidence of aortic stenosis. Aortic valve mean gradient measures 3.0 mmHg. Aortic valve peak gradient measures 6.0 mmHg. Aortic valve area, by VTI measures 0.94 cm. Pulmonic Valve: The pulmonic valve was normal in structure. Pulmonic  valve regurgitation is trivial. Aorta: The aortic root is normal in size and structure. Venous: The inferior vena cava is normal in size with greater than 50% respiratory variability, suggesting right atrial pressure of 3 mmHg. IAS/Shunts: The atrial septum is grossly normal.  LEFT VENTRICLE PLAX 2D LVIDd:         3.30 cm LVIDs:         2.70 cm LV PW:         1.10 cm LV IVS:        0.70 cm LVOT diam:     1.60 cm LV SV:         22 LV SV Index:   16 LVOT Area:     2.01 cm  LV Volumes (MOD) LV vol d, MOD A2C: 29.4 ml LV vol d, MOD A4C: 27.2 ml LV vol s, MOD A2C: 15.6 ml LV vol s, MOD A4C: 13.1 ml LV SV MOD A2C:     13.8 ml LV SV MOD A4C:     27.2 ml LV SV MOD BP:      13.6 ml RIGHT VENTRICLE RV Basal diam:  2.10 cm RV Mid diam:    1.70 cm LEFT ATRIUM             Index        RIGHT ATRIUM  Index LA diam:        4.20 cm 3.03 cm/m   RA Area:     14.30 cm LA Vol (A2C):   73.1 ml 52.68 ml/m  RA Volume:   33.20 ml  23.92 ml/m LA Vol (A4C):   35.3 ml 25.44 ml/m LA Biplane Vol: 51.5 ml 37.11 ml/m  AORTIC VALVE                    PULMONIC VALVE AV Area (Vmax):    0.96 cm     PV Vmax:       0.78 m/s AV Area (Vmean):   0.92 cm     PV Vmean:      54.700 cm/s AV Area (VTI):     0.94 cm     PV VTI:        0.167 m AV Vmax:           122.00 cm/s  PV Peak grad:  2.4 mmHg AV Vmean:          82.000 cm/s  PV Mean grad:  1.0 mmHg AV VTI:            0.233 m AV Peak Grad:      6.0 mmHg AV Mean Grad:      3.0 mmHg LVOT Vmax:         58.45 cm/s LVOT Vmean:        37.600 cm/s LVOT VTI:          0.109 m LVOT/AV VTI ratio: 0.47  AORTA Ao Root diam: 2.10 cm Ao Asc diam:  2.70 cm MITRAL VALVE                TRICUSPID VALVE MV Area (PHT): 1.95 cm     TR Peak grad:   21.0 mmHg MV Area VTI:   0.66 cm     TR Vmax:        229.00 cm/s MV Peak grad:  14.7 mmHg MV Mean grad:  8.5 mmHg     SHUNTS MV Vmax:       1.92 m/s     Systemic VTI:  0.11 m MV Vmean:      134.0 cm/s   Systemic Diam: 1.60 cm MV E velocity: 188.00 cm/s Gwyndolyn Kaufman MD Electronically signed by Gwyndolyn Kaufman MD Signature Date/Time: 12/09/2021/3:28:43 PM    Final      PHYSICAL EXAM  Temp:  [97.4 F (36.3 C)-98.4 F (36.9 C)] 97.8 F (36.6 C) (02/17 1945) Pulse Rate:  [61-97] 97 (02/17 1945) Resp:  [14-20] 18 (02/17 1945) BP: (106-141)/(67-86) 121/71 (02/17 1945) SpO2:  [96 %-99 %] 98 % (02/17 1945)  General - Well nourished, well developed, in no apparent distress.  Ophthalmologic - fundi not visualized due to noncooperation.  Cardiovascular - irregularly irregular heart rate and rhythm.  Neuro - awake, alert, eyes open, orientated to people, but not orientated to place age or time.  Psychomotor slowing, no aphasia +, paucity of speech but following all simple commands. No gaze palsy, tracking bilaterally, blinking to be described bilaterally.  Mild left chronic facial droop. Tongue midline. RUE 5/5, LUE 4+/5. Bilaterally LEs 5/5, no drift. Sensation and coronation not quite cooperative, gait not tested.     ASSESSMENT/PLAN Ms. PRESCOTT NARES is a 61 y.o. female with history of rheumatic mitral valve stenosis, CAD status post stent in 2016, right MCA stroke, A-fib on Coumadin admitted for left-sided worsening weakness, slurred speech and left worsening facial  droop.  Symptoms wax and wane.  TIA: Right brain TIA likely due to A-fib on Coumadin with lower end of INR level CT no acute abnormality MRI no acute infarct, chronic moderate-sized cortical/subcortical right MCA territory infarct CT head and neck unremarkable 2D Echo EF 45 to 50% LDL 36 HgbA1c 6.6 Coumadin for VTE prophylaxis warfarin daily and aspirin 81 prior to admission, now on aspirin 81 and warfarin daily and increase INR goal to 2.5-3.0. Patient counseled to be compliant with her antithrombotic medications Ongoing aggressive stroke risk factor management Therapy recommendations: Home health PT/OT Disposition: Pending  Rheumatic mitral valve stenosis A-fib on  Coumadin Cardiology on board Recommend to continue Coumadin due to rheumatoid mitral stenosis Agree with increase INR goal to 2.5-3.0  Hyperlipidemia Home meds: Lipitor 80 LDL 3 6, goal < 70 Now on Lipitor 80 Continue statin at discharge  Other Stroke Risk Factors Hx stroke of right MCA territory Coronary artery disease status post stent in 2016  Other [facility] day # 0  Neurology will sign off. Please call with questions. Pt will follow up with stroke clinic NP at [facility] in about 4 weeks. Thanks for the consult.   Rosalin Hawking, MD PhD Stroke Neurology 12/10/2021 7:56 PM    To contact Stroke Continuity provider, please refer to [email or web address] After hours, contact General Neurology

## 2021-12-10 NOTE — Progress Notes (Signed)
Inpatient Diabetes Program Recommendations  AACE/ADA: New Consensus Statement on Inpatient Glycemic Control (2015)  Target Ranges:  Prepandial:   less than 140 mg/dL      Peak postprandial:   less than 180 mg/dL (1-2 hours)      Critically ill patients:  140 - 180 mg/dL   Lab Results  Component Value Date   GLUCAP 128 (H) 12/09/2021   HGBA1C 6.6 (H) 12/01/2021    Review of Glycemic Control  Diabetes history: DM 2 Outpatient Diabetes medications: metformin in the past none currently Current orders for Inpatient glycemic control:  None  A1c 6.6% on 2/8  Spoke with son regarding monitoring of glucose at home and what to watch for in glucose levels and treatment. Discussed glucose ad A1c goals. Discussed lifestyle modifications through diet and exercise. Covered s/s of hyper and hypoglycemia.  Thanks,  Christena Deem RN, MSN, BC-ADM Inpatient Diabetes Coordinator Team Pager [phone] (8a-5p)

## 2021-12-10 NOTE — TOC Transition Note (Addendum)
Transition of Care [facility]) - CM/SW Discharge Note   Patient Details  Name: Christy Poole MRN: [id number] Date of Birth: April 16, 1961  Transition [facility]) CM/SW Contact:  Kermit Balo, RN Phone Number: 12/10/2021, 2:33 PM   Clinical Narrative:    Patient is from home with her son. He provides needed transportation and over sees her medications.  Orders for home health services. Son without a preference. CM has arranged home health with [facility]. Information on the AVS. Pt has PCP: [place] Walker and 3 in 1 for home were delivered to the room per [employer].  Son has questions about medicaid. CM has sent a referral to financial counseling and they will call and provide the needed information. Son has questions about home DM management. CM has sent referral to DM coordinator to follow up with him. HH RN also added to follow up on DM questions int he home.  Pt has transportation home when discharged.   1615: pt and son provided 30 day free card and $10 copay cards for Elquis  Final next level of care: Home w Home Health Services Barriers to Discharge: No Barriers Identified   Patient Goals and CMS Choice   CMS Medicare.gov Compare Post Acute Care list provided to:: Patient Represenative (must comment) Choice offered to / list presented to : Adult Children  Discharge Placement                       Discharge Plan and Services                DME Arranged: 3-N-1, Walker rolling DME Agency:  [facility]) Date DME Agency Contacted: 12/10/21   Representative spoke with at DME Agency: Vaughan Basta HH Arranged: RN, PT, OT, Nurse's Aide, Social Work [employer] Agency: [facility] Date [facility] Agency Contacted: 12/10/21   Representative spoke with at [facility] Agency: Kandee Keen  Social Determinants of Health (SDOH) Interventions     Readmission Risk Interventions No flowsheet data found.

## 2021-12-10 NOTE — Progress Notes (Signed)
Cardiology just left bedside requesting ROI forms, asked Charge RN, only found ROI for transfer from [facility] and told that CM/medical records have left for the day and handle this.  ROI for: [facility] in [place].  Address: [address] [place], [place] [address] Phone: [phone]  and  [facility] Cardiology at [facility] From Dr. Foye Deer  Address: [address] [place], [place] [id number]  ROI to be faxed to [facility] ATTN: Dr. Royann Shivers  [phone]

## 2021-12-10 NOTE — Assessment & Plan Note (Signed)
Severe by echo interpretation. I appreciate formal cardiology evaluation, perhaps this is moderate in severity. Will be followed as outpatient.  - Will need to continue coumadin as anticoagulation for valvular AFib, goal INR 2.5. Dosing per pharmacy.

## 2021-12-10 NOTE — Evaluation (Signed)
Occupational Therapy Evaluation Patient Details Name: Christy Poole MRN: [id number] DOB: 1961/05/23 Today's Date: 12/10/2021   History of Present Illness 61 y.o. female presents to [facility] hospital on 12/08/2021 with L weakness and facial droop.CT and MRI head negative for acute CVA. PMH includes CVA with residual L weakness, Afib, CAD.   Clinical Impression   This 61 yo female admitted with above presents to acute OT PLOF of needing increased A as of lately. She reports her change in vision is new but her left arm and walking is back to her baseline (however she is not safe to be up on her feet by herself without RW and even with RW she really needs someone at min guard A level). Per son when he cannot be there one of his cousins can. Pt will continue to benefit from acute OT with follow up HHOT.     Recommendations for follow up therapy are one component of a multi-disciplinary discharge planning process, led by the attending physician.  Recommendations may be updated based on patient status, additional functional criteria and insurance authorization.   Follow Up Recommendations  Home health OT (HHAide, HHRN, HHSW)    Assistance Recommended at Discharge Frequent or constant Supervision/Assistance  Patient can return home with the following A little help with walking and/or transfers;A little help with bathing/dressing/bathroom;Direct supervision/assist for medications management;Direct supervision/assist for financial management;Assistance with cooking/housework;Help with stairs or ramp for entrance    Functional Status Assessment  Patient has had a recent decline in their functional status and demonstrates the ability to make significant improvements in function in a reasonable and predictable amount of time.  Equipment Recommendations  BSC/3in1       Precautions / Restrictions Precautions Precautions: Fall Precaution Comments: pre-syncope? reports of multiple prior falls, some with loss of  consciousness Restrictions Weight Bearing Restrictions: No      Mobility Bed Mobility Overal bed mobility: Needs Assistance Bed Mobility: Supine to Sit, Sit to Supine     Supine to sit: Supervision Sit to supine: Supervision   General bed mobility comments: increased time    Transfers Overall transfer level: Needs assistance Equipment used: Rolling walker (2 wheels) Transfers: Sit to/from Stand Sit to Stand: Min guard                  Balance Overall balance assessment: Needs assistance Sitting-balance support: No upper extremity supported, Feet supported Sitting balance-Leahy Scale: Good     Standing balance support: Bilateral upper extremity supported Standing balance-Leahy Scale: Poor                             ADL either performed or assessed with clinical judgement   ADL Overall ADL's : Needs assistance/impaired Eating/Feeding: Sitting;Modified independent Eating/Feeding Details (indicate cue type and reason): increased time Grooming: Set up;Supervision/safety;Sitting   Upper Body Bathing: Set up;Supervision/ safety;Sitting   Lower Body Bathing: Min guard;Sit to/from stand   Upper Body Dressing : Set up;Supervision/safety;Sitting   Lower Body Dressing: Min guard;Sit to/from stand   Toilet Transfer: Min guard;Ambulation;Rolling walker (2 wheels)   Toileting- Clothing Manipulation and Hygiene: Min guard;Sit to/from stand               Vision Baseline Vision/History: 1 Wears glasses (reading only) Ability to See in Adequate Light: 2 Moderately impaired Patient Visual Report: Blurring of vision Vision Assessment?: Yes Eye Alignment: Within Functional Limits Ocular Range of Motion:  (ROM is not restricted to left,  she looses site of object and her eyes go back to midline) Alignment/Gaze Preference: Within Defined Limits Tracking/Visual Pursuits:  (when tracking left, she "looses" site of pen and eyes go back to midline) Visual  Fields:  (Does not appear to see anything from left eye when testing visual fields. When she covers right eye, she can see movement of pen but it is very blurry. She shes right eye lower lateral visual field less readily than right eye upper lateral visual field.)            Pertinent Vitals/Pain Pain Assessment Pain Assessment: No/denies pain     Hand Dominance Right   Extremity/Trunk Assessment Upper Extremity Assessment Upper Extremity Assessment: LUE deficits/detail LUE Deficits / Details: grossly 4-/5, pt moving LUE slowly but able to hold up against gravity.Decreased coordination for functional  tasks LUE Coordination: decreased fine motor           Communication Communication Communication: Prefers language other than English (Language: Gujarati)   Cognition Arousal/Alertness: Awake/alert Behavior During Therapy: WFL for tasks assessed/performed Overall Cognitive Status: Within Functional Limits for tasks assessed                                 General Comments: used tablet interperter services (Richi as interpreter name)                Home Living Family/patient expects to be discharged to:: Private residence Living Arrangements: Children Available Help at Discharge: Family;Available PRN/intermittently Type of Home: House Home Access: Level entry     Home Layout: Two level Alternate Level Stairs-Number of Steps: 15 Alternate Level Stairs-Rails: Left Bathroom Shower/Tub: Tub/shower unit;Curtain   Bathroom Toilet: Standard     Home Equipment: None          Prior Functioning/Environment Prior Level of Function : Needs assist       Physical Assist : Mobility (physical);ADLs (physical) Mobility (physical): Gait ADLs (physical): IADLs Mobility Comments: pt ambulates for short household distances, son estimates 25 steps at a time, at home. Pt often reports weakness and fatigue, intermittent reports of dizziness ADLs Comments: Pt  reports she sometimes requires assistance for ADLs and tasks.        OT Problem List: Impaired balance (sitting and/or standing);Decreased coordination;Impaired vision/perception      OT Treatment/Interventions: Self-care/ADL training;DME and/or AE instruction;Patient/family education;Balance training;Visual/perceptual remediation/compensation    OT Goals(Current goals can be found in the care plan section) Acute Rehab OT Goals Patient Stated Goal: son: to get some A at home OT Goal Formulation: With patient/family Time For Goal Achievement: 12/24/21 Potential to Achieve Goals: Good  OT Frequency: Min 2X/week       AM-PAC OT "6 Clicks" Daily Activity     Outcome Measure Help from another person eating meals?: A Little Help from another person taking care of personal grooming?: A Little Help from another person toileting, which includes using toliet, bedpan, or urinal?: A Little Help from another person bathing (including washing, rinsing, drying)?: A Little Help from another person to put on and taking off regular upper body clothing?: A Little Help from another person to put on and taking off regular lower body clothing?: A Little 6 Click Score: 18   End of Session Equipment Utilized During Treatment: Gait belt;Rolling walker (2 wheels)  Activity Tolerance: Patient tolerated treatment well Patient left: in bed;with call bell/phone within reach;with bed alarm set  OT Visit Diagnosis: Unsteadiness on  feet (R26.81);Muscle weakness (generalized) (M62.81);Low vision, both eyes (H54.2);Hemiplegia and hemiparesis Hemiplegia - Right/Left: Left Hemiplegia - dominant/non-dominant: Non-Dominant 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

## 2021-12-10 NOTE — Progress Notes (Signed)
ANTICOAGULATION CONSULT NOTE -  follow up  Pharmacy Consult for warfarin Indication: atrial fibrillation  No Known Allergies  Patient Measurements: Height: 4' 9.5" (146.1 cm) Weight: 48.5 kg (107 lb) IBW/kg (Calculated) : 39.75  Vital Signs: Temp: 98.3 F (36.8 C) (02/17 1146) Temp Source: Oral (02/17 1146) BP: 118/86 (02/17 1146) Pulse Rate: 86 (02/17 1146)  Labs: Recent Labs    12/08/21 1657 12/08/21 2111 12/09/21 1233 12/10/21 0824  HGB 13.6  --   --  13.6  HCT 42.6  --   --  42.1  PLT 169  --   --  138*  LABPROT  --   --  24.7* 24.8*  INR  --   --  2.2* 2.2*  CREATININE 0.80  --   --   --   TROPONINIHS 6 7  --   --      Estimated Creatinine Clearance: 49.8 mL/min (by C-G formula based on SCr of 0.8 mg/dL).   Medical History: Past Medical History:  Diagnosis Date   Atrial fibrillation (HCC)    Diabetes mellitus without complication (HCC)    Stroke (HCC)    Thyroid disease     Assessment: 61 YO female admitted for facial droop and left-sided weakness. Patient was on warfarin prior to admission for atrial fibrillation.  INR on admission  was therapeutic at 2.2. Will continue home regimen of warfarin 5 mg daily.    Today INR remains 2.2, therapeutic.  No bleeding noted.  H/H stable wnl. , PLTC 169>138 k.   Goal of Therapy:  INR 2-3 Monitor platelets by anticoagulation protocol: Yes   Plan:  Warfarin 5 mg x1 today.  Monitor CBC, INR, drug interactions, and s/sx of bleeding daily  If INR remains therapeutic /stable will consider decreasing INR checks.  Thank you for allowing pharmacy to be part of this patients care team. Noah Delaine, RPh Clinical Pharmacist [phone]  12/10/2021 1:44 PM Please check AMION for all [facility] Pharm[facility] phone numbers After 10:00 PM, call Main Pharmacy [phone]

## 2021-12-10 NOTE — Consult Note (Signed)
Cardiology Consultation:   Patient ID: Christy Poole MRN: [id number]; DOB: 07/19/1961  Admit date: 12/08/2021 Date of Consult: 12/10/2021  PCP:  de [place], Raymond J, MD   [facility] Providers Cardiologist:  None        Patient Profile:   Christy Poole is a 61 y.o. female with a hx of rheumatic mitral valve stenosis, diabetes mellitus, hypercholesterolemia, previous stroke and persistent atrial fibrillation who is being seen 12/10/2021 for the evaluation of mitral valve stenosis at the request of Dr. Bonner Puna.  History of Present Illness:   [name] was admitted with a transient ischemic attack on February 15.  Her neurological complaints resolved within roughly 18 hours.  CT and subsequent MRI did not show evidence of a new ischemic injury, but do show sequelae of a previous large ischemic stroke.  In addition to her neurological complaints she complained of chest pain and headache.  She has had similar chest pain, often with household chores, relieved within 15 minutes of rest.  There may have been a temporary improvement in the problems with chest pain following her balloon valvuloplasty, but they never resolved completely.  The patient has a history of rheumatic mitral stenosis for which she underwent balloon valvuloplasty in [place] in 2016 or 2017.  Per the son's report, coronary angiography performed before that procedure showed minor coronary atherosclerosis, no obstruction.  She was subsequently cared for at [facility] in [place].  Her echocardiogram shows typical rheumatic changes of the mitral valve with thickening and shortening of the subvalvular apparatus and thickening and mild calcification of the leaflets, with a fishmouth deformation of the mitral orifice.  Although the study was interpreted as showing severe mitral stenosis, in my opinion the gradients and valve area are more in keeping with moderate mitral stenosis (mean gradient 9 mmHg at 67 bpm, pressure  half-time 160 ms, mitral valve area 1.3 cm by my calculations).  Clearly it is difficult to perform calculations in this patient in the setting of an irregular heart rhythm.  Suffice to say that she does not have critical mitral stenosis.  You are unable to estimate the presence of pulmonary hypertension at rest, but there is no evidence of significant right ventricular dysfunction on the echo, although there is right axis deviation on her ECG.  On my review of her echocardiogram the Wilkins score is at least 7, probably 8.  This makes her a less than optimal candidate for repeat balloon valvuloplasty, but is not prohibitive.  She has only mild mitral insufficiency.  She has spontaneously controlled rates with atrial fibrillation.  At night her typical heart rate is in the mid to high 60s.  During the daytime it is in the 80s and 90s.  In the past she took both digoxin and metoprolol, both have been discontinued.   Past Medical History:  Diagnosis Date   Atrial fibrillation ([place])    Diabetes mellitus without complication ([place])    Stroke [facility])    Thyroid disease     History reviewed. No pertinent surgical history.   Home Medications:  Prior to Admission medications   Medication Sig Start Date End Date Taking? Authorizing Provider  acetaminophen (TYLENOL) 500 MG tablet Take 1,000 mg by mouth every 6 (six) hours as needed for mild pain, headache or fever.   Yes [provider]  aspirin EC 81 MG tablet Take 81 mg by mouth daily. Swallow whole.   Yes [provider]  atorvastatin (LIPITOR) 80 MG  tablet Take 80 mg by mouth daily.   Yes [provider]  levothyroxine (SYNTHROID) 50 MCG tablet Take 50 mcg by mouth at bedtime.   Yes [provider]  warfarin (COUMADIN) 2.5 MG tablet Take 5 mg by mouth at bedtime.   Yes [provider]    Inpatient Medications: Scheduled Meds:  aspirin EC  81 mg Oral Daily   atorvastatin  80 mg Oral Daily    levothyroxine  50 mcg Oral QHS   warfarin  5 mg Oral ONCE-1600   Warfarin - Pharmacist Dosing Inpatient   Does not apply q1600   Continuous Infusions:  PRN Meds: acetaminophen, senna-docusate  Allergies:   No Known Allergies  Social History:   Social History   Socioeconomic History   Marital status: Widowed    Spouse name: Not on file   Number of children: Not on file   Years of education: Not on file   Highest education level: Not on file  Occupational History   Not on file  Tobacco Use   Smoking status: Never   Smokeless tobacco: Never  Vaping Use   Vaping Use: Never used  Substance and Sexual Activity   Alcohol use: Never   Drug use: Never   Sexual activity: Never  Other Topics Concern   Not on file  Social History Narrative   Not on file   Social Determinants of Health   Financial Resource Strain: Not on file  Food Insecurity: Not on file  Transportation Needs: Not on file  Physical Activity: Not on file  Stress: Not on file  Social Connections: Not on file  Intimate Partner Violence: Not on file    Family History:   History reviewed. No pertinent family history.   ROS:  Please see the history of present illness.   All other ROS reviewed and negative.     Physical Exam/Data:   Vitals:   12/10/21 0344 12/10/21 0818 12/10/21 1146 12/10/21 1527  BP: 106/67 (!) 141/79 118/86 132/79  Pulse: 84 61 86 72  Resp: 16 20 14 14   Temp: (!) 97.4 F (36.3 C) 97.9 F (36.6 C) 98.3 F (36.8 C) 98.4 F (36.9 C)  TempSrc: Oral Oral Oral Oral  SpO2: 99% 96% 99% 99%  Weight:      Height:        Intake/Output Summary (Last 24 hours) at 12/10/2021 1618 Last data filed at 12/10/2021 0800 Gross per 24 hour  Intake 558.98 ml  Output --  Net 558.98 ml   Last 3 Weights 12/08/2021 12/01/2021  Weight (lbs) 107 lb 107 lb  Weight (kg) 48.535 kg 48.535 kg     Body mass index is 22.75 kg/m.  General:  Well nourished, well developed, in no acute distress HEENT:  normal Neck: no JVD Vascular: No carotid bruits; Distal pulses 2+ bilaterally Cardiac:  normal S1, S2, do not appreciate an opening snap; irregular rhythm; no systolic murmur, there is a apical diastolic rumble that is very difficult to hear unless she is in full left decubitus Lungs:  clear to auscultation bilaterally, no wheezing, rhonchi or rales  Abd: soft, nontender, no hepatomegaly  Ext: no edema Musculoskeletal:  No deformities, BUE and BLE strength normal and equal Skin: warm and dry  Neuro:  CNs 2-12 intact, no focal abnormalities noted Psych:  Normal affect   EKG:  The EKG was personally reviewed and demonstrates: Atrial fibrillation, mild right axis deviation, generalized low voltage Telemetry:  Telemetry was personally reviewed and demonstrates:  Atrial fibrillation with controlled ventricular rate, 60s at night and 80s-90s during the day  Relevant CV Studies: Echocardiogram 12/09/2021   1. There is severe, calcific mitral stenosis with average mean gradient  12mmHg at HR 67. MVA by VTI 1.0cm2.   2. Left ventricular ejection fraction, by estimation, is 45 to 50%. The  left ventricle has mildly decreased function. Left ventricular endocardial  border not optimally defined to evaluate regional wall motion. Diastolic  function is indeterminant due to  Afib.   3. Right ventricular systolic function is normal. The right ventricular  size is normal.   4. Left atrial size was severely dilated.   5. Right atrial size was moderately dilated.   6. The mitral valve is abnormal. Mild mitral valve regurgitation. Severe  mitral stenosis. The mean mitral valve gradient is 8.5 mmHg. Severe mitral  annular calcification.   7. The aortic valve is tricuspid. There is mild calcification of the  aortic valve. There is mild thickening of the aortic valve. Aortic valve  regurgitation is not visualized. Aortic valve sclerosis/calcification is  present, without any evidence of  aortic stenosis.    8. The inferior vena cava is normal in size with greater than 50%  respiratory variability, suggesting right atrial pressure of 3 mmHg.   Laboratory Data:  High Sensitivity Troponin:   Recent Labs  Lab 12/08/21 1657 12/08/21 2111  TROPONINIHS 6 7     Chemistry Recent Labs  Lab 12/08/21 1657  NA 138  K 4.3  CL 107  CO2 23  GLUCOSE 119*  BUN 15  CREATININE 0.80  CALCIUM 9.4  GFRNONAA >60  ANIONGAP 8    No results for input(s): PROT, ALBUMIN, AST, ALT, ALKPHOS, BILITOT in the last 168 hours. Lipids No results for input(s): CHOL, TRIG, HDL, LABVLDL, LDLCALC, CHOLHDL in the last 168 hours.  Hematology Recent Labs  Lab 12/08/21 1657 12/10/21 0824  WBC 6.8 5.6  RBC 4.81 4.78  HGB 13.6 13.6  HCT 42.6 42.1  MCV 88.6 88.1  MCH 28.3 28.5  MCHC 31.9 32.3  RDW 13.3 13.3  PLT 169 138*   Thyroid No results for input(s): TSH, FREET4 in the last 168 hours.  BNPNo results for input(s): BNP, PROBNP in the last 168 hours.  DDimer No results for input(s): DDIMER in the last 168 hours.   Radiology/Studies:  CT ANGIO HEAD NECK W WO CM  Result Date: 12/09/2021 CLINICAL DATA:  Altered mental status, chest pain, weakness EXAM: CT ANGIOGRAPHY HEAD AND NECK TECHNIQUE: Multidetector CT imaging of the head and neck was performed using the standard protocol during bolus administration of intravenous contrast. Multiplanar CT image reconstructions and MIPs were obtained to evaluate the vascular anatomy. Carotid stenosis measurements (when applicable) are obtained utilizing NASCET criteria, using the distal internal carotid diameter as the denominator. RADIATION DOSE REDUCTION: This exam was performed according to the departmental dose-optimization program which includes automated exposure control, adjustment of the mA and/or kV according to patient size and/or use of iterative reconstruction technique. CONTRAST:  47mL OMNIPAQUE IOHEXOL 350 MG/ML SOLN COMPARISON:  Same day brain MRI, noncontrast  CT head dated 1 day prior FINDINGS: CT HEAD FINDINGS Brain: There is no evidence of acute intracranial hemorrhage, extra-axial fluid collection, or acute infarct. The remote infarct in the right MCA distribution is again seen. There is unchanged ex vacuo dilatation of the right lateral ventricle. The ventricles are otherwise normal in size and appearance. The background parenchymal volume is otherwise normal. There is no mass  lesion.  There is no midline shift. Vascular: See below Skull: Normal. Negative for fracture or focal lesion. Sinuses and orbits: The paranasal sinuses are clear. The globes and orbits are unremarkable. Other: None. Review of the MIP images confirms the above findings CTA NECK FINDINGS Aortic arch: There is mild calcified atherosclerotic plaque in the aortic arch. The origins of the major branch vessels are patent. The subclavian arteries are patent with non flow limiting plaque just after the takeoff of the common carotid artery on the right. Right carotid system: The right common, internal, and external carotid arteries are patent, without hemodynamically significant stenosis or occlusion. There is no dissection or aneurysm. Left carotid system: There is scattered mild calcified plaque in the left common carotid artery without hemodynamically significant stenosis. The left internal and external carotid arteries are patent, without hemodynamically significant stenosis or occlusion. There is no dissection or aneurysm. Vertebral arteries: The vertebral arteries are patent, without hemodynamically significant stenosis or occlusion. There is no dissection or aneurysm. Skeleton: There is mild degenerative change of the cervical spine at C4-C5. There is no acute osseous abnormality or aggressive osseous lesion. There is no visible canal hematoma. Other neck: The soft tissues are unremarkable. Upper chest: There is patchy geographic ground-glass opacity in the lung apices which may reflect air  trapping or multifocal infection. There is a 9 mm enhancing lesion in the AP window. Review of the MIP images confirms the above findings CTA HEAD FINDINGS Anterior circulation: There is mild calcified plaque in the intracranial ICAs without hemodynamically significant stenosis or occlusion. The bilateral MCAs are patent. The bilateral ACAs are patent. The anterior communicating artery is normal. There is no aneurysm or AVM. Posterior circulation: The bilateral V4 segments are patent. PICA is identified bilaterally. The basilar artery is patent. The bilateral PCAs are patent. A small right posterior communicating artery is identified. The left posterior communicating artery is not definitely seen. There is no aneurysm or AVM. Venous sinuses: Patent. Anatomic variants: None. Review of the MIP images confirms the above findings IMPRESSION: 1. No acute intracranial pathology. Unchanged remote right MCA territory infarct. 2. Patent vasculature of the head and neck. 3. Mosaic attenuation in the lung apices suggestive of air trapping/small airway disease. 4. Probable lymphadenopathy in the AP window. Recommend contrast enhanced CT of the chest for further evaluation. This may be performed on a nonemergent basis as indicated. Electronically Signed   By: Valetta Mole M.D.   On: 12/09/2021 12:27   DG Chest 2 View  Result Date: 12/08/2021 CLINICAL DATA:  Chest pain. Stroke this morning around 4 a.m. upon waking up. Weakness. Unable to speak. EXAM: CHEST - 2 VIEW COMPARISON:  Chest two views 10/21/2021 FINDINGS: Cardiac silhouette is again mildly enlarged. Mediastinal contours are within normal limits. There is again increased lucency suggesting emphysematous change. Mild chronic bilateral mid and lower lung interstitial thickening appears similar to prior. No definite focal airspace opacity to indicate pneumonia. No pleural effusion or pneumothorax. Moderate multilevel degenerative disc changes of the midthoracic spine.  IMPRESSION: Mild bilateral mid and lower lung interstitial thickening is similar to prior. This may represent mild interstitial pulmonary edema or viral infection in the acute setting. It may represent scarring in the chronic setting. Electronically Signed   By: Yvonne Kendall M.D.   On: 12/08/2021 18:34   CT HEAD WO CONTRAST (5MM)  Result Date: 12/08/2021 CLINICAL DATA:  Delirium EXAM: CT HEAD WITHOUT CONTRAST TECHNIQUE: Contiguous axial images were obtained from the base of  the skull through the vertex without intravenous contrast. RADIATION DOSE REDUCTION: This exam was performed according to the departmental dose-optimization program which includes automated exposure control, adjustment of the mA and/or kV according to patient size and/or use of iterative reconstruction technique. COMPARISON:  None. FINDINGS: Brain: No acute territorial infarction, hemorrhage, or intracranial mass. Moderate encephalomalacia within the right frontal lobe consistent with remote infarct. Encephalomalacia involving right insula and sub insula. Nonenlarged ventricles. Vascular: No hyperdense vessels.  Carotid vascular calcification. Skull: Normal. Negative for fracture or focal lesion. Sinuses/Orbits: No acute finding. Other: None IMPRESSION: 1. No CT evidence for acute intracranial abnormality. 2. Remote right MCA infarct. Electronically Signed   By: Donavan Foil M.D.   On: 12/08/2021 17:18   MR BRAIN WO CONTRAST  Result Date: 12/09/2021 CLINICAL DATA:  Provided history: Transient ischemic attack. EXAM: MRI HEAD WITHOUT CONTRAST TECHNIQUE: Multiplanar, multiecho pulse sequences of the brain and surrounding structures were obtained without intravenous contrast. COMPARISON:  Head CT 12/08/2021. FINDINGS: Brain: Redemonstrated moderate-sized chronic cortical/subcortical right MCA territory infarct involving portions of the right frontal and parietal lobes as well as posterior right insula. Mild chronic hemosiderin deposition at  this site. Background cerebral volume appears normal for age. Mild multifocal T2 FLAIR hyperintense signal abnormality elsewhere within the cerebral white matter, nonspecific but compatible with chronic small vessel ischemic disease. This includes a small chronic lacunar infarct within left parietal white matter (series 11, image 16). There is no acute infarct. No evidence of an intracranial mass. No extra-axial fluid collection. No midline shift. Vascular: Maintained flow voids within the proximal large arterial vessels. Skull and upper cervical spine: No focal suspicious marrow lesion. Sinuses/Orbits: Visualized orbits show no acute finding. No significant paranasal sinus disease. Other: Trace fluid within the right mastoid air cells. IMPRESSION: No evidence of acute intracranial abnormality. Moderate-sized chronic cortical/subcortical right MCA territory infarct. Background mild cerebral white matter chronic small vessel ischemic disease, including a small chronic lacunar infarct the left parietal white matter. Trace fluid within the right mastoid air cells. Electronically Signed   By: Kellie Simmering D.O.   On: 12/09/2021 11:55   ECHOCARDIOGRAM COMPLETE  Result Date: 12/09/2021    ECHOCARDIOGRAM REPORT   Patient Name:   WILLODEAN CRICKENBERGER Date of Exam: 12/09/2021 Medical Rec #:  [id number]    Height:       57.5 in Accession #:    [id number]   Weight:       107.0 lb Date of Birth:  Mar 24, 1961    BSA:          1.388 m Patient Age:    43 years     BP:           152/87 mmHg Patient Gender: F            HR:           83 bpm. Exam Location:  Inpatient Procedure: Limited Echo, Cardiac Doppler and Color Doppler Indications:    CVA  History:        Patient has no prior history of Echocardiogram examinations.                 Stroke, Arrythmias:Atrial Fibrillation; Risk Factors:Diabetes.  Sonographer:    Luisa Hart RDCS Referring Phys: [place]  1. There is severe, calcific mitral stenosis with average mean  gradient 53mmHg at HR 67. MVA by VTI 1.0cm2.  2. Left ventricular ejection fraction, by estimation, is 45 to 50%. The left ventricle  has mildly decreased function. Left ventricular endocardial border not optimally defined to evaluate regional wall motion. Diastolic function is indeterminant due to Afib.  3. Right ventricular systolic function is normal. The right ventricular size is normal.  4. Left atrial size was severely dilated.  5. Right atrial size was moderately dilated.  6. The mitral valve is abnormal. Mild mitral valve regurgitation. Severe mitral stenosis. The mean mitral valve gradient is 8.5 mmHg. Severe mitral annular calcification.  7. The aortic valve is tricuspid. There is mild calcification of the aortic valve. There is mild thickening of the aortic valve. Aortic valve regurgitation is not visualized. Aortic valve sclerosis/calcification is present, without any evidence of aortic stenosis.  8. The inferior vena cava is normal in size with greater than 50% respiratory variability, suggesting right atrial pressure of 3 mmHg. Comparison(s): No prior Echocardiogram. Conclusion(s)/Recommendation(s): No intracardiac source of embolism detected on this transthoracic study. Consider a transesophageal echocardiogram to exclude cardiac source of embolism if clinically indicated. FINDINGS  Left Ventricle: Left ventricular ejection fraction, by estimation, is 45 to 50%. The left ventricle has mildly decreased function. Left ventricular endocardial border not optimally defined to evaluate regional wall motion. The left ventricular internal cavity size was normal in size. There is no left ventricular hypertrophy. Diastolic function is indeterminant due to Afib. Right Ventricle: The right ventricular size is normal. No increase in right ventricular wall thickness. Right ventricular systolic function is normal. Left Atrium: Left atrial size was severely dilated. Right Atrium: Right atrial size was moderately  dilated. Pericardium: There is no evidence of pericardial effusion. Mitral Valve: MVA by VTI 1.0cm2. The mitral valve is abnormal. There is moderate thickening of the mitral valve leaflet(s). There is moderate calcification of the mitral valve leaflet(s). Severe mitral annular calcification. Mild mitral valve regurgitation. Severe mitral valve stenosis. MV peak gradient, 14.7 mmHg. The mean mitral valve gradient is 8.5 mmHg. Tricuspid Valve: The tricuspid valve is normal in structure. Tricuspid valve regurgitation is trivial. Aortic Valve: The aortic valve is tricuspid. There is mild calcification of the aortic valve. There is mild thickening of the aortic valve. Aortic valve regurgitation is not visualized. Aortic valve sclerosis/calcification is present, without any evidence of aortic stenosis. Aortic valve mean gradient measures 3.0 mmHg. Aortic valve peak gradient measures 6.0 mmHg. Aortic valve area, by VTI measures 0.94 cm. Pulmonic Valve: The pulmonic valve was normal in structure. Pulmonic valve regurgitation is trivial. Aorta: The aortic root is normal in size and structure. Venous: The inferior vena cava is normal in size with greater than 50% respiratory variability, suggesting right atrial pressure of 3 mmHg. IAS/Shunts: The atrial septum is grossly normal.  LEFT VENTRICLE PLAX 2D LVIDd:         3.30 cm LVIDs:         2.70 cm LV PW:         1.10 cm LV IVS:        0.70 cm LVOT diam:     1.60 cm LV SV:         22 LV SV Index:   16 LVOT Area:     2.01 cm  LV Volumes (MOD) LV vol d, MOD A2C: 29.4 ml LV vol d, MOD A4C: 27.2 ml LV vol s, MOD A2C: 15.6 ml LV vol s, MOD A4C: 13.1 ml LV SV MOD A2C:     13.8 ml LV SV MOD A4C:     27.2 ml LV SV MOD BP:      13.6  ml RIGHT VENTRICLE RV Basal diam:  2.10 cm RV Mid diam:    1.70 cm LEFT ATRIUM             Index        RIGHT ATRIUM           Index LA diam:        4.20 cm 3.03 cm/m   RA Area:     14.30 cm LA Vol (A2C):   73.1 ml 52.68 ml/m  RA Volume:   33.20 ml   23.92 ml/m LA Vol (A4C):   35.3 ml 25.44 ml/m LA Biplane Vol: 51.5 ml 37.11 ml/m  AORTIC VALVE                    PULMONIC VALVE AV Area (Vmax):    0.96 cm     PV Vmax:       0.78 m/s AV Area (Vmean):   0.92 cm     PV Vmean:      54.700 cm/s AV Area (VTI):     0.94 cm     PV VTI:        0.167 m AV Vmax:           122.00 cm/s  PV Peak grad:  2.4 mmHg AV Vmean:          82.000 cm/s  PV Mean grad:  1.0 mmHg AV VTI:            0.233 m AV Peak Grad:      6.0 mmHg AV Mean Grad:      3.0 mmHg LVOT Vmax:         58.45 cm/s LVOT Vmean:        37.600 cm/s LVOT VTI:          0.109 m LVOT/AV VTI ratio: 0.47  AORTA Ao Root diam: 2.10 cm Ao Asc diam:  2.70 cm MITRAL VALVE                TRICUSPID VALVE MV Area (PHT): 1.95 cm     TR Peak grad:   21.0 mmHg MV Area VTI:   0.66 cm     TR Vmax:        229.00 cm/s MV Peak grad:  14.7 mmHg MV Mean grad:  8.5 mmHg     SHUNTS MV Vmax:       1.92 m/s     Systemic VTI:  0.11 m MV Vmean:      134.0 cm/s   Systemic Diam: 1.60 cm MV E velocity: 188.00 cm/s Gwyndolyn Kaufman MD Electronically signed by Gwyndolyn Kaufman MD Signature Date/Time: 12/09/2021/3:28:43 PM    Final      Assessment and Plan:   Mitral stenosis: She has typical echocardiographic findings of rheumatic mitral stenosis with commissural fusion, chordal shortening and effusion and a fishmouth orifice.  In my opinion she only has moderate mitral stenosis.  There is mild mitral insufficiency.  Need to get records from the previous institutions where she was cared for to compare the echocardiographic findings.  It is conceivable that her chest pain may be related to mitral stenosis, due to transient increase in pulmonary artery pressures during physical exercise.  It is possible that she will need a repeat valvuloplasty in the next few years.  In the meantime need to keep her heart rate relatively slow and will resume metoprolol succinate 25 mg once daily.  This can be fine-tuned at her follow-up appointment in the  office.   Atrial fibrillation: Due to the presence of rheumatic mitral stenosis she should not be switched to a direct oral anticoagulant.  She needs to remain on warfarin anticoagulation with target INR 2.5. History of stroke/possible recent TIA: INR was 2.2 and she is also taking aspirin.  No convincing objective evidence of new ischemic injury.  Continue same stroke prevention regimen. History of minor CAD: We will request records of her coronary angiogram performed in [place] in 2016 or 2017.  Coronary risk factors are well addressed. Hyperlipoproteinemia: Excellent lipid profile on current dose of statin. DM: Good glycemic control, hemoglobin A1c 6.6%   Risk Assessment/Risk Scores:          CHA2DS2-VASc Score =     This indicates a  % annual risk of stroke. The patient's score is based upon: CHA2DS2-VASc score is inappropriate in the setting of rheumatic valvular heart disease.        For questions or updates, please contact [place] Please consult [email or web address] for contact info under    Signed, Sanda Klein, MD  12/10/2021 4:18 PM

## 2021-12-10 NOTE — Progress Notes (Signed)
Progress Note  Patient: Christy Poole [id number] DOB: 07-08-1961  DOA: 12/08/2021  DOS: 12/10/2021    Brief hospital course: Christy Poole is a 61 y.o. female with medical history significant of stroke with residual left sided weakness, atrial fibrillation on coumadin, hypothyroidism and CAD s/p remote PCI who presented to the ED 2/15 with facial droop and left-sided weakness which had improved by the time of presentation.    The patient speaks only [place], lives with her son, who speaks [place] as well, in [place] where they moved a couple months ago from [place]. She stated she woke up early in the morning with droopy left face and worsen weakness in her left arm than she normally does. He confirms seeing evidence of this as well, and states she's had overall waxing/waning of weakness on the left side though progressively worsened over the past several months. These symptoms returned to their baseline prior to getting to the hospital and they ended up waiting about 18 hours to be seen after CT showed no acute stroke. Subsequent MRI has shown no acute stroke either, though there is evidence of the prior moderate-sized cortical/subcortical right MCA territory infarct as well as white matter chronic small vessel ischemic disease including a lacunar infarct of the left parietal white matter.    Neurology was consulted, recommended CTA head and neck, echocardiogram and PT/OT evaluations. Hospitalists called to admit.  Cardiology consulted for LV systolic dysfunction, mitral stenosis, and evaluation of chest pain in patient with CAD. Records from prior institutions where the patient received care in [place] and [place] are to be requested. I was unable to locate any records through care everywhere.   Assessment and Plan: * Left hand weakness- (present on admission) Acute on chronic. No definitive evidence of acute stroke at this time. Will continue medications as previously recommended, final  recommendations from stroke neurology pending at this time.   - Home health and DME arranged.   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 I appreciate formal cardiology evaluation, perhaps this is moderate in severity. Will be followed as outpatient.  - Will need to continue coumadin as anticoagulation for valvular AFib, goal INR 2.5. Dosing per pharmacy.  Thrombocytopenia ([name]) Noted on CBC today, will check again in AM given her coumadin and new aspirin.  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 No home meds listed for this. Recent HbA1c at PCP 2/8 was 6.6% which is at goal. - Very sensitive SSI.  Dyslipidemia- (present on admission) Recent LDL 38, HDL 51. Will continue high-intensity statin.  Atrial fibrillation ([name])- (present on admission) Presumed to be chronic. Rate is controlled.  - Continue coumadin - Not on rate control medication at home, though cardiology recommends keeping rate low, started metoprolol. Will monitor on telemetry.   Hypothyroidism- (present on admission) Recent TSH 2.620. Continue synthroid.  Additionally, financial counselor has been consulted to assist family with medicaid, etc. Pt has PCP.   Subjective: Weakness in left arm and leg are waxing, waning, overall more severe than her previous baseline and stable today. No further chest pain at this time.   Objective: Vitals:   12/10/21 0344 12/10/21 0818 12/10/21 1146 12/10/21 1527  BP: 106/67 (!) 141/79 118/86 132/79  Pulse: 84 61 86 72  Resp: 16 20 14 14   Temp: (!) 97.4 F (36.3 C) 97.9 F (36.6 C) 98.3 F (36.8 C) 98.4 F (36.9 C)  TempSrc: Oral Oral Oral Oral  SpO2: 99% 96% 99% 99%  Weight:  Height:       Gen: Nontoxic 60 y.o. female in no distress Pulm: Nonlabored breathing room air.  CV: Irreg irreg, no definite murmur, rub, or gallop. No JVD, no dependent edema. GI: Abdomen soft, non-tender, non-distended, with normoactive bowel sounds.  Ext: Warm,  no deformities Skin: No new rashes, lesions or ulcers on visualized skin. Neuro: Alert and oriented. Left sided weakness UE > LE noted in comparison to right without any new focal neurological deficits. Psych: Judgement and insight appear fair. Mood euthymic & affect congruent. Behavior is appropriate.    Data Personally reviewed:  CBC: Recent Labs  Lab 12/08/21 1657 12/10/21 0824  WBC 6.8 5.6  NEUTROABS 3.8  --   HGB 13.6 13.6  HCT 42.6 42.1  MCV 88.6 88.1  PLT 169 0000000*   Basic Metabolic Panel: Recent Labs  Lab 12/08/21 1657  NA 138  K 4.3  CL 107  CO2 23  GLUCOSE 119*  BUN 15  CREATININE 0.80  CALCIUM 9.4   Coagulation Profile: Recent Labs  Lab 12/09/21 1233 12/10/21 0824  INR 2.2* 2.2*   CBG: Recent Labs  Lab 12/09/21 0007  GLUCAP 128*   CT ANGIO HEAD NECK W WO CM  Result Date: 12/09/2021 CLINICAL DATA:  Altered mental status, chest pain, weakness EXAM: CT ANGIOGRAPHY HEAD AND NECK TECHNIQUE: Multidetector CT imaging of the head and neck was performed using the standard protocol during bolus administration of intravenous contrast. Multiplanar CT image reconstructions and MIPs were obtained to evaluate the vascular anatomy. Carotid stenosis measurements (when applicable) are obtained utilizing NASCET criteria, using the distal internal carotid diameter as the denominator. RADIATION DOSE REDUCTION: This exam was performed according to the departmental dose-optimization program which includes automated exposure control, adjustment of the mA and/or kV according to patient size and/or use of iterative reconstruction technique. CONTRAST:  16mL OMNIPAQUE IOHEXOL 350 MG/ML SOLN COMPARISON:  Same day brain MRI, noncontrast CT head dated 1 day prior FINDINGS: CT HEAD FINDINGS Brain: There is no evidence of acute intracranial hemorrhage, extra-axial fluid collection, or acute infarct. The remote infarct in the right MCA distribution is again seen. There is unchanged ex vacuo  dilatation of the right lateral ventricle. The ventricles are otherwise normal in size and appearance. The background parenchymal volume is otherwise normal. There is no mass lesion.  There is no midline shift. Vascular: See below Skull: Normal. Negative for fracture or focal lesion. Sinuses and orbits: The paranasal sinuses are clear. The globes and orbits are unremarkable. Other: None. Review of the MIP images confirms the above findings CTA NECK FINDINGS Aortic arch: There is mild calcified atherosclerotic plaque in the aortic arch. The origins of the major branch vessels are patent. The subclavian arteries are patent with non flow limiting plaque just after the takeoff of the common carotid artery on the right. Right carotid system: The right common, internal, and external carotid arteries are patent, without hemodynamically significant stenosis or occlusion. There is no dissection or aneurysm. Left carotid system: There is scattered mild calcified plaque in the left common carotid artery without hemodynamically significant stenosis. The left internal and external carotid arteries are patent, without hemodynamically significant stenosis or occlusion. There is no dissection or aneurysm. Vertebral arteries: The vertebral arteries are patent, without hemodynamically significant stenosis or occlusion. There is no dissection or aneurysm. Skeleton: There is mild degenerative change of the cervical spine at C4-C5. There is no acute osseous abnormality or aggressive osseous lesion. There is no visible canal hematoma. Other  neck: The soft tissues are unremarkable. Upper chest: There is patchy geographic ground-glass opacity in the lung apices which may reflect air trapping or multifocal infection. There is a 9 mm enhancing lesion in the AP window. Review of the MIP images confirms the above findings CTA HEAD FINDINGS Anterior circulation: There is mild calcified plaque in the intracranial ICAs without hemodynamically  significant stenosis or occlusion. The bilateral MCAs are patent. The bilateral ACAs are patent. The anterior communicating artery is normal. There is no aneurysm or AVM. Posterior circulation: The bilateral V4 segments are patent. PICA is identified bilaterally. The basilar artery is patent. The bilateral PCAs are patent. A small right posterior communicating artery is identified. The left posterior communicating artery is not definitely seen. There is no aneurysm or AVM. Venous sinuses: Patent. Anatomic variants: None. Review of the MIP images confirms the above findings IMPRESSION: 1. No acute intracranial pathology. Unchanged remote right MCA territory infarct. 2. Patent vasculature of the head and neck. 3. Mosaic attenuation in the lung apices suggestive of air trapping/small airway disease. 4. Probable lymphadenopathy in the AP window. Recommend contrast enhanced CT of the chest for further evaluation. This may be performed on a nonemergent basis as indicated. Electronically Signed   By: Valetta Mole M.D.   On: 12/09/2021 12:27   DG Chest 2 View  Result Date: 12/08/2021 CLINICAL DATA:  Chest pain. Stroke this morning around 4 a.m. upon waking up. Weakness. Unable to speak. EXAM: CHEST - 2 VIEW COMPARISON:  Chest two views 10/21/2021 FINDINGS: Cardiac silhouette is again mildly enlarged. Mediastinal contours are within normal limits. There is again increased lucency suggesting emphysematous change. Mild chronic bilateral mid and lower lung interstitial thickening appears similar to prior. No definite focal airspace opacity to indicate pneumonia. No pleural effusion or pneumothorax. Moderate multilevel degenerative disc changes of the midthoracic spine. IMPRESSION: Mild bilateral mid and lower lung interstitial thickening is similar to prior. This may represent mild interstitial pulmonary edema or viral infection in the acute setting. It may represent scarring in the chronic setting. Electronically Signed    By: Yvonne Kendall M.D.   On: 12/08/2021 18:34   MR BRAIN WO CONTRAST  Result Date: 12/09/2021 CLINICAL DATA:  Provided history: Transient ischemic attack. EXAM: MRI HEAD WITHOUT CONTRAST TECHNIQUE: Multiplanar, multiecho pulse sequences of the brain and surrounding structures were obtained without intravenous contrast. COMPARISON:  Head CT 12/08/2021. FINDINGS: Brain: Redemonstrated moderate-sized chronic cortical/subcortical right MCA territory infarct involving portions of the right frontal and parietal lobes as well as posterior right insula. Mild chronic hemosiderin deposition at this site. Background cerebral volume appears normal for age. Mild multifocal T2 FLAIR hyperintense signal abnormality elsewhere within the cerebral white matter, nonspecific but compatible with chronic small vessel ischemic disease. This includes a small chronic lacunar infarct within left parietal white matter (series 11, image 16). There is no acute infarct. No evidence of an intracranial mass. No extra-axial fluid collection. No midline shift. Vascular: Maintained flow voids within the proximal large arterial vessels. Skull and upper cervical spine: No focal suspicious marrow lesion. Sinuses/Orbits: Visualized orbits show no acute finding. No significant paranasal sinus disease. Other: Trace fluid within the right mastoid air cells. IMPRESSION: No evidence of acute intracranial abnormality. Moderate-sized chronic cortical/subcortical right MCA territory infarct. Background mild cerebral white matter chronic small vessel ischemic disease, including a small chronic lacunar infarct the left parietal white matter. Trace fluid within the right mastoid air cells. Electronically Signed   By: Kellie Simmering D.O.  On: 12/09/2021 11:55   ECHOCARDIOGRAM COMPLETE  Result Date: 12/09/2021    ECHOCARDIOGRAM REPORT   Patient Name:   AVERLEE REDFERN Date of Exam: 12/09/2021 Medical Rec #:  [id number]    Height:       57.5 in Accession #:     [id number]   Weight:       107.0 lb Date of Birth:  04/02/1961    BSA:          1.388 m Patient Age:    65 years     BP:           152/87 mmHg Patient Gender: F            HR:           83 bpm. Exam Location:  Inpatient Procedure: Limited Echo, Cardiac Doppler and Color Doppler Indications:    CVA  History:        Patient has no prior history of Echocardiogram examinations.                 Stroke, Arrythmias:Atrial Fibrillation; Risk Factors:Diabetes.  Sonographer:    Luisa Hart RDCS Referring Phys: [place]  1. There is severe, calcific mitral stenosis with average mean gradient 48mmHg at HR 67. MVA by VTI 1.0cm2.  2. Left ventricular ejection fraction, by estimation, is 45 to 50%. The left ventricle has mildly decreased function. Left ventricular endocardial border not optimally defined to evaluate regional wall motion. Diastolic function is indeterminant due to Afib.  3. Right ventricular systolic function is normal. The right ventricular size is normal.  4. Left atrial size was severely dilated.  5. Right atrial size was moderately dilated.  6. The mitral valve is abnormal. Mild mitral valve regurgitation. Severe mitral stenosis. The mean mitral valve gradient is 8.5 mmHg. Severe mitral annular calcification.  7. The aortic valve is tricuspid. There is mild calcification of the aortic valve. There is mild thickening of the aortic valve. Aortic valve regurgitation is not visualized. Aortic valve sclerosis/calcification is present, without any evidence of aortic stenosis.  8. The inferior vena cava is normal in size with greater than 50% respiratory variability, suggesting right atrial pressure of 3 mmHg. Comparison(s): No prior Echocardiogram. Conclusion(s)/Recommendation(s): No intracardiac source of embolism detected on this transthoracic study. Consider a transesophageal echocardiogram to exclude cardiac source of embolism if clinically indicated. FINDINGS  Left Ventricle: Left  ventricular ejection fraction, by estimation, is 45 to 50%. The left ventricle has mildly decreased function. Left ventricular endocardial border not optimally defined to evaluate regional wall motion. The left ventricular internal cavity size was normal in size. There is no left ventricular hypertrophy. Diastolic function is indeterminant due to Afib. Right Ventricle: The right ventricular size is normal. No increase in right ventricular wall thickness. Right ventricular systolic function is normal. Left Atrium: Left atrial size was severely dilated. Right Atrium: Right atrial size was moderately dilated. Pericardium: There is no evidence of pericardial effusion. Mitral Valve: MVA by VTI 1.0cm2. The mitral valve is abnormal. There is moderate thickening of the mitral valve leaflet(s). There is moderate calcification of the mitral valve leaflet(s). Severe mitral annular calcification. Mild mitral valve regurgitation. Severe mitral valve stenosis. MV peak gradient, 14.7 mmHg. The mean mitral valve gradient is 8.5 mmHg. Tricuspid Valve: The tricuspid valve is normal in structure. Tricuspid valve regurgitation is trivial. Aortic Valve: The aortic valve is tricuspid. There is mild calcification of the aortic valve. There is mild thickening of  the aortic valve. Aortic valve regurgitation is not visualized. Aortic valve sclerosis/calcification is present, without any evidence of aortic stenosis. Aortic valve mean gradient measures 3.0 mmHg. Aortic valve peak gradient measures 6.0 mmHg. Aortic valve area, by VTI measures 0.94 cm. Pulmonic Valve: The pulmonic valve was normal in structure. Pulmonic valve regurgitation is trivial. Aorta: The aortic root is normal in size and structure. Venous: The inferior vena cava is normal in size with greater than 50% respiratory variability, suggesting right atrial pressure of 3 mmHg. IAS/Shunts: The atrial septum is grossly normal.  LEFT VENTRICLE PLAX 2D LVIDd:         3.30 cm LVIDs:          2.70 cm LV PW:         1.10 cm LV IVS:        0.70 cm LVOT diam:     1.60 cm LV SV:         22 LV SV Index:   16 LVOT Area:     2.01 cm  LV Volumes (MOD) LV vol d, MOD A2C: 29.4 ml LV vol d, MOD A4C: 27.2 ml LV vol s, MOD A2C: 15.6 ml LV vol s, MOD A4C: 13.1 ml LV SV MOD A2C:     13.8 ml LV SV MOD A4C:     27.2 ml LV SV MOD BP:      13.6 ml RIGHT VENTRICLE RV Basal diam:  2.10 cm RV Mid diam:    1.70 cm LEFT ATRIUM             Index        RIGHT ATRIUM           Index LA diam:        4.20 cm 3.03 cm/m   RA Area:     14.30 cm LA Vol (A2C):   73.1 ml 52.68 ml/m  RA Volume:   33.20 ml  23.92 ml/m LA Vol (A4C):   35.3 ml 25.44 ml/m LA Biplane Vol: 51.5 ml 37.11 ml/m  AORTIC VALVE                    PULMONIC VALVE AV Area (Vmax):    0.96 cm     PV Vmax:       0.78 m/s AV Area (Vmean):   0.92 cm     PV Vmean:      54.700 cm/s AV Area (VTI):     0.94 cm     PV VTI:        0.167 m AV Vmax:           122.00 cm/s  PV Peak grad:  2.4 mmHg AV Vmean:          82.000 cm/s  PV Mean grad:  1.0 mmHg AV VTI:            0.233 m AV Peak Grad:      6.0 mmHg AV Mean Grad:      3.0 mmHg LVOT Vmax:         58.45 cm/s LVOT Vmean:        37.600 cm/s LVOT VTI:          0.109 m LVOT/AV VTI ratio: 0.47  AORTA Ao Root diam: 2.10 cm Ao Asc diam:  2.70 cm MITRAL VALVE                TRICUSPID VALVE MV Area (PHT): 1.95 cm     TR Peak  grad:   21.0 mmHg MV Area VTI:   0.66 cm     TR Vmax:        229.00 cm/s MV Peak grad:  14.7 mmHg MV Mean grad:  8.5 mmHg     SHUNTS MV Vmax:       1.92 m/s     Systemic VTI:  0.11 m MV Vmean:      134.0 cm/s   Systemic Diam: 1.60 cm MV E velocity: 188.00 cm/s Gwyndolyn Kaufman MD Electronically signed by Gwyndolyn Kaufman MD Signature Date/Time: 12/09/2021/3:28:43 PM    Final     SARS-CoV-2 PCR: Negative Influenza A/B: Negative  Family Communication: Son at bedside  Disposition: Status is: Inpatient Remains inpatient appropriate because: requires formal cardiology and stroke neurology  evaluations. Planned Discharge Destination: Home with [place], MD 12/10/2021 5:52 PM Page by [email or web address]

## 2021-12-10 NOTE — Assessment & Plan Note (Signed)
Noted on CBC today, will check again in AM given her coumadin and new aspirin.

## 2021-12-10 NOTE — Progress Notes (Signed)
Physical Therapy Treatment Patient Details Name: Christy Poole MRN: [id number] DOB: March 30, 1961 Today's Date: 12/10/2021   History of Present Illness 61 y.o. female presents to [facility] hospital on 12/08/2021 with L weakness and facial droop.CT and MRI head negative for acute CVA. PMH includes CVA with residual L weakness, Afib, CAD.    PT Comments    The pt was agreeable to session with focus on progression of gait and balance challenges. The pt was able to demo great improvement in hallway ambulation tolerance (from 64ft at eval to 150 ft today) and then was able to complete various standing balance tasks after brief seated rest. The pt demos no overt LOB, but did have a few instances of knee buckling that she was able to recover without assist. The pt demos poor stability with narrow BOS for static stance and poor reaching outside BOS in standing position. The pt was agreeable to sitting in recliner, encouraged to continue mobilizing with staff to maintain strength and mobility.    Recommendations for follow up therapy are one component of a multi-disciplinary discharge planning process, led by the attending physician.  Recommendations may be updated based on patient status, additional functional criteria and insurance authorization.  Follow Up Recommendations  Home health PT (OPPT if HHPT not covered)     Assistance Recommended at Discharge Frequent or constant Supervision/Assistance  Patient can return home with the following A little help with walking and/or transfers;A little help with bathing/dressing/bathroom;Assistance with cooking/housework;Assistance with feeding;Direct supervision/assist for medications management;Direct supervision/assist for financial management;Assist for transportation;Help with stairs or ramp for entrance   Equipment Recommendations  Rolling walker (2 wheels) (transport chair)    Recommendations for Other Services       Precautions / Restrictions  Precautions Precautions: Fall Precaution Comments: pre-syncope? reports of multiple prior falls, some with loss of consciousness Restrictions Weight Bearing Restrictions: No     Mobility  Bed Mobility Overal bed mobility: Needs Assistance Bed Mobility: Supine to Sit     Supine to sit: Supervision     General bed mobility comments: increased time and pt using bed rails    Transfers Overall transfer level: Needs assistance Equipment used: None, Rolling walker (2 wheels) Transfers: Sit to/from Stand Sit to Stand: Min guard           General transfer comment: minG on initial attempt, pt pausing in partial squat stating she doesnt have balance, but was then able to stand without buckling or assist. later in session completing without RW and with minG    Ambulation/Gait Ambulation/Gait assistance: Min guard Gait Distance (Feet): 150 Feet Assistive device: Rolling walker (2 wheels) Gait Pattern/deviations: Step-through pattern, Decreased step length - left Gait velocity: reduced   Pre-gait activities: standing marches x10 each leg both with RW and HHA General Gait Details: pt with slowed gait with no overt LOB but increased sway. poor ability to steer around obstacles, but able to follow multi-step commands with hallway ambulation. also completed 15 ft with HHA only and minA       Balance Overall balance assessment: Needs assistance Sitting-balance support: No upper extremity supported, Feet supported Sitting balance-Leahy Scale: Good Sitting balance - Comments: able to reach outside of BOS to don socks   Standing balance support: No upper extremity supported, During functional activity Standing balance-Leahy Scale: Fair Standing balance comment: pt able to ambulate in hall with RW, short distance in room with HHA. static standing with no UE support     Tandem Stance - Right  Leg: 10 Tandem Stance - Left Leg: 5 Rhomberg - Eyes Opened: 8 Rh[place]berg - Eyes Closed:  3 High level balance activites: Direction changes, Turns High Level Balance Comments: increased to minA at times to steady            Cognition Arousal/Alertness: Awake/alert Behavior During Therapy: WFL for tasks assessed/performed Overall Cognitive Status: Within Functional Limits for tasks assessed                                 General Comments: used table interpreter with pt inconsistenty able to answer questions (states no pain, answering some questions in english, unable to answer what was done with OT this morning but does state she remembers OT coming)        Exercises      General Comments General comments (skin integrity, edema, etc.): HR to 101 with ambulation, pt reports difficulty breathing and poor endurance. states mobility with PT is more than she does at baseline      Pertinent Vitals/Pain Pain Assessment Pain Assessment: No/denies pain Pain Intervention(s): Monitored during session     PT Goals (current goals can now be found in the care plan section) Acute Rehab PT Goals Patient Stated Goal: to make pt more confident in ambulation PT Goal Formulation: With patient/family Time For Goal Achievement: 12/23/21 Potential to Achieve Goals: Fair Progress towards PT goals: Progressing toward goals    Frequency    Min 4X/week      PT Plan Current plan remains appropriate       AM-PAC PT "6 Clicks" Mobility   Outcome Measure  Help needed turning from your back to your side while in a flat bed without using bedrails?: None Help needed moving from lying on your back to sitting on the side of a flat bed without using bedrails?: None Help needed moving to and from a bed to a chair (including a wheelchair)?: A Little Help needed standing up from a chair using your arms (e.g., wheelchair or bedside chair)?: A Little Help needed to walk in hospital room?: A Little Help needed climbing 3-5 steps with a railing? : A Little 6 Click Score:  20    End of Session Equipment Utilized During Treatment: Gait belt Activity Tolerance: Patient limited by fatigue Patient left: with call bell/phone within reach;with family/visitor present;in chair;with chair alarm set Nurse Communication: Mobility status PT Visit Diagnosis: Other abnormalities of gait and mobility (R26.89)     Time: 4782-9562 PT Time Calculation (min) (ACUTE ONLY): 28 min  Charges:  $Therapeutic Exercise: 8-22 mins $Neuromuscular Re-education: 8-22 mins                     Vickki Muff, PT, DPT   Acute Rehabilitation Department Pager #: [phone]   Ronnie Derby 12/10/2021, 3:40 PM

## 2021-12-11 DIAGNOSIS — I4821 Permanent atrial fibrillation: Secondary | ICD-10-CM | POA: Diagnosis not present

## 2021-12-11 DIAGNOSIS — R29898 Other symptoms and signs involving the musculoskeletal system: Secondary | ICD-10-CM | POA: Diagnosis not present

## 2021-12-11 DIAGNOSIS — E119 Type 2 diabetes mellitus without complications: Secondary | ICD-10-CM | POA: Diagnosis not present

## 2021-12-11 DIAGNOSIS — E785 Hyperlipidemia, unspecified: Secondary | ICD-10-CM | POA: Diagnosis not present

## 2021-12-11 LAB — CBC
HCT: 38.4 % (ref 36.0–46.0)
Hemoglobin: 12.6 g/dL (ref 12.0–15.0)
MCH: 28.7 pg (ref 26.0–34.0)
MCHC: 32.8 g/dL (ref 30.0–36.0)
MCV: 87.5 fL (ref 80.0–100.0)
Platelets: 136 10*3/uL — ABNORMAL LOW (ref 150–400)
RBC: 4.39 MIL/uL (ref 3.87–5.11)
RDW: 13.3 % (ref 11.5–15.5)
WBC: 5.9 10*3/uL (ref 4.0–10.5)
nRBC: 0 % (ref 0.0–0.2)

## 2021-12-11 LAB — PROTIME-INR
INR: 2.3 — ABNORMAL HIGH (ref 0.8–1.2)
Prothrombin Time: 25.3 seconds — ABNORMAL HIGH (ref 11.4–15.2)

## 2021-12-11 MED ORDER — METOPROLOL SUCCINATE ER 25 MG PO TB24: 25.0000 mg | ORAL_TABLET | Freq: Every day | ORAL | 0 refills | Status: DC

## 2021-12-11 MED ORDER — WARFARIN SODIUM 5 MG PO TABS: 5.0000 mg | ORAL_TABLET | Freq: Every day | ORAL | Status: DC

## 2021-12-11 MED ORDER — WARFARIN SODIUM 7.5 MG PO TABS: 7.5000 mg | ORAL_TABLET | Freq: Once | ORAL | Status: DC

## 2021-12-11 NOTE — Progress Notes (Addendum)
Dr Flora Lipps reviewed chart and relayed recommendation to f/u cardiology. Pt had previously scheduled OV 3/13 with Dr. Cristal Deer - per Dr. Flora Lipps, will keep this appt. IM aware to contact cardiology if they need [place] to follow INR going forward. Addendum: per IM, INR followed by PCP.

## 2021-12-11 NOTE — Progress Notes (Signed)
Physical Therapy Treatment Patient Details Name: Christy Poole MRN: [id number] DOB: [birth year] Today's Date: 12/11/2021   History of Present Illness 61 y.o. female presents to [facility] hospital on 12/08/2021 with L weakness and facial droop.CT and MRI head negative for acute CVA. PMH includes CVA with residual L weakness, Afib, CAD.    PT Comments    PTA saw patient this session for stair training.  Her gt is slowly improving and she has family support for ambulation at home.  Mild instability on stairs.  Ready for d/c home with support at this time.     Recommendations for follow up therapy are one component of a multi-disciplinary discharge planning process, led by the attending physician.  Recommendations may be updated based on patient status, additional functional criteria and insurance authorization.  Follow Up Recommendations  Home health PT (OPPT if HHPT not covered.)     Assistance Recommended at Discharge    Patient can return home with the following     Equipment Recommendations       Recommendations for Other Services       Precautions / Restrictions Precautions Precautions: Fall Precaution Comments: pre-syncope? reports of multiple prior falls, some with loss of consciousness Restrictions Weight Bearing Restrictions: No     Mobility  Bed Mobility Overal bed mobility: Needs Assistance Bed Mobility: Supine to Sit     Supine to sit: Modified independent (Device/Increase time)          Transfers Overall transfer level: Needs assistance Equipment used: Rolling walker (2 wheels) Transfers: Sit to/from Stand Sit to Stand: Min guard           General transfer comment: Cues to scoot forward to edge of bed to ground B feet before attempting to stand.    Ambulation/Gait Ambulation/Gait assistance: Min guard Gait Distance (Feet): 140 Feet Assistive device: Rolling walker (2 wheels) Gait Pattern/deviations: Step-through pattern, Decreased step length - left        General Gait Details: Pt with slow gt but no LOB.   Stairs Stairs: Yes Stairs assistance: Min guard Stair Management: Two rails Number of Stairs: 4 General stair comments: Cues for sequencing and hand placement this session.  Pt tolerated well.  Mild instability but no true buckling.  Educated on correct sequencing to improve ease.   Wheelchair Mobility    Modified Rankin (Stroke Patients Only)       Balance Overall balance assessment: Needs assistance Sitting-balance support: No upper extremity supported, Feet supported Sitting balance-Leahy Scale: Good Sitting balance - Comments: able to reach outside of BOS to don socks                                    Cognition Arousal/Alertness: Awake/alert Behavior During Therapy: WFL for tasks assessed/performed Overall Cognitive Status: Within Functional Limits for tasks assessed                                 General Comments: Son attempted to interpret session but he is not consistent.        Exercises      General Comments        Pertinent Vitals/Pain Pain Assessment Pain Assessment: No/denies pain    Home Living  Prior Function            PT Goals (current goals can now be found in the care plan section) Acute Rehab PT Goals Patient Stated Goal: to make pt more confident in ambulation Potential to Achieve Goals: Good Progress towards PT goals: Progressing toward goals    Frequency    Min 4X/week      PT Plan Current plan remains appropriate    Co-evaluation              AM-PAC PT "6 Clicks" Mobility   Outcome Measure                   End of Session Equipment Utilized During Treatment: Gait belt Activity Tolerance: Patient limited by fatigue Patient left: with call bell/phone within reach;with family/visitor present;in chair;with chair alarm set Nurse Communication: Mobility status PT Visit Diagnosis: Other  abnormalities of gait and mobility (R26.89)     Time: [id number] PT Time Calculation (min) (ACUTE ONLY): 9 min  Charges:  $Gait Training: 8-22 mins                     Erasmo Leventhal , PTA Acute Rehabilitation Services Pager [phone] Office [phone]    Raziya Aveni Eli Hose 12/11/2021, 12:02 PM

## 2021-12-11 NOTE — Discharge Instructions (Signed)
For your warfarin: -Take 7.5 mg (3 tablets) tonight (2/18), then resume 5 mg (2 tablets) daily.  Please make an appointment to follow up with your provider and get an INR check.  -Your new INR goal is 2.5-3 instead of 2-3.

## 2021-12-11 NOTE — Progress Notes (Addendum)
ANTICOAGULATION CONSULT NOTE - Follow Up Consult  Pharmacy Consult for Warfarin Indication: atrial fibrillation  No Known Allergies  Patient Measurements: Height: 4' 9.5" (146.1 cm) Weight: 48.5 kg (107 lb) IBW/kg (Calculated) : 39.75 kg  Vital Signs: Temp: 98 F (36.7 C) (02/18 0825) Temp Source: Oral (02/18 0825) BP: 155/96 (02/18 0825) Pulse Rate: 84 (02/18 0825)  Labs: Recent Labs    12/08/21 1657 12/08/21 2111 12/09/21 1233 12/10/21 0824 12/11/21 0449  HGB 13.6  --   --  13.6 12.6  HCT 42.6  --   --  42.1 38.4  PLT 169  --   --  138* 136*  LABPROT  --   --  24.7* 24.8* 25.3*  INR  --   --  2.2* 2.2* 2.3*  CREATININE 0.80  --   --   --   --   TROPONINIHS 6 7  --   --   --     Estimated Creatinine Clearance: 49.8 mL/min (by C-G formula based on SCr of 0.8 mg/dL).   Assessment: 61 yo female with a history of atrial fibrillation, rheumatic mitral valve stenosis, and previous stroke presents with facial droop and left-sided weakness. PTA the patient is on warfarin for atrial fibrillation, INR 2.2 upon admission. Pharmacy is consulted to dose warfarin.  PTA Regimen: Warfarin PO 5 mg daily (goal of 2.0-3.0 prior to admission)  INR is subtherapeutic at 2.3 (INR goal changed to 2.5-3.0 during this admission). There are no signs or symptoms of bleeding documented. Hgb 12.6, platelets 136. No new interacting medications have been initiated and the patient's diet remains stable.  Goal of Therapy:  INR 2.5-3.0 Monitor platelets by anticoagulation protocol: Yes   Plan:  Warfarin PO 7.5 mg x 1 dose tonight Monitor a daily PT/INR and CBC Watch for significant diet changes and drug-drug interactions Monitor for signs and symptoms of bleeding   Shauna Hugh, PharmD, [place]  PGY-2 Pharmacy Resident 12/11/2021 9:11 AM  Please check [email or web address] for unit-specific pharmacy phone numbers.

## 2021-12-11 NOTE — Discharge Summary (Signed)
Physician Discharge Summary   Patient: Christy Poole MRN: [id number] DOB: [birth year]  Admit date:     12/08/2021  Discharge date: 12/11/21  Discharge Physician: Patrecia Pour   PCP: de [place], Blondell Reveal, MD   Recommendations at discharge:  Follow up with cardiology as scheduled. Requesting records from [facility] in [place], [place] and from [place]. Not available in Care Everywhere. Pending at time of discharge. Started metoprolol 25mg  which can be titrated at follow up.  Monitor BMP, CBC, and INR. Continue coumadin with goal INR 2.5 - 3.0.  Follow up with PCP, Dr. de [place] and with neurology after discharge. Patient/family to follow up with DSS to pursue medicaid in [place].  Discharge Diagnoses: Principal Problem:   Left hand weakness Active Problems:   Hypothyroidism   Atrial fibrillation (HCC)   History of stroke   Dyslipidemia   Diabetes mellitus (HCC)   Thrombocytopenia (HCC)   Rheumatic mitral stenosis  Hospital Course: Daxa SAKENA WEHRS is a 61 y.o. female with medical history significant of stroke with residual left sided weakness, atrial fibrillation on coumadin, hypothyroidism and CAD s/p remote PCI who presented to the ED 2/15 with facial droop and left-sided weakness which had improved by the time of presentation.    The patient speaks only [place], lives with her son, who speaks [place] as well, in [place] where they moved a couple months ago from [place]. She stated she woke up early in the morning with droopy left face and worsen weakness in her left arm than she normally does. He confirms seeing evidence of this as well, and states she's had overall waxing/waning of weakness on the left side though progressively worsened over the past several months. These symptoms returned to their baseline prior to getting to the hospital and they ended up waiting about 18 hours to be seen after CT showed no acute stroke. Subsequent MRI has shown no acute stroke  either, though there is evidence of the prior moderate-sized cortical/subcortical right MCA territory infarct as well as white matter chronic small vessel ischemic disease including a lacunar infarct of the left parietal white matter.    Neurology was consulted, recommended CTA head and neck, echocardiogram and PT/OT evaluations. Hospitalists called to admit.   Cardiology consulted for LV systolic dysfunction, mitral stenosis, and evaluation of chest pain in patient with CAD. Records from prior institutions where the patient received care in [place] and [place] are requested. I was unable to locate any records through care everywhere. Metoprolol started as below and outpatient follow up recommended.  Assessment and Plan: * Left hand weakness- (present on admission) Acute on chronic. No definitive evidence of acute stroke at this time. Will continue medications as previously recommended, final recommendations from stroke neurology pending at this time.   - Home health and DME arranged.   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 I appreciate formal cardiology evaluation, perhaps this is moderate in severity. Will be followed as outpatient.  - Will need to continue coumadin as anticoagulation for valvular AFib, goal INR 2.5. Dosing per pharmacy.  Thrombocytopenia (HCC) Stable.  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 No home meds listed for this. Recent HbA1c at PCP 2/8 was 6.6% which is at goal.  Dyslipidemia- (present on admission) Recent LDL 38, HDL 51. Will continue high-intensity statin.  Atrial fibrillation ([place])- (present on admission) Presumed to be chronic. Rate is controlled.  - Continue coumadin - Not on rate control medication at home,  though cardiology recommends keeping rate low, started metoprolol. Will monitor on telemetry.   Hypothyroidism- (present on admission) Recent TSH 2.620. Continue synthroid.  Consultants: Neurology,  Cardiology Procedures performed: Echo  Disposition: Home Diet recommendation:  Cardiac diet  DISCHARGE MEDICATION: Allergies as of 12/11/2021   No Known Allergies      Medication List     TAKE these medications    acetaminophen 500 MG tablet Commonly known as: TYLENOL Take 1,000 mg by mouth every 6 (six) hours as needed for mild pain, headache or fever.   aspirin EC 81 MG tablet Take 81 mg by mouth daily. Swallow whole.   atorvastatin 80 MG tablet Commonly known as: LIPITOR Take 80 mg by mouth daily.   levothyroxine 50 MCG tablet Commonly known as: SYNTHROID Take 50 mcg by mouth at bedtime.   metoprolol succinate 25 MG 24 hr tablet Commonly known as: TOPROL-XL Take 1 tablet (25 mg total) by mouth daily.   warfarin 2.5 MG tablet Commonly known as: COUMADIN Take 5 mg by mouth at bedtime. Notes to patient: Take 7.5 mg (3 tablets) tonight, then resume 5 mg (2 tablets) daily         Follow-up Information     Care, [facility] Follow up.   Specialty: Home Health Services Why: The home health agency will contact you for the first home visit. Contact information: [place] [address] [phone]         [facility] Social [facility] Follow up.   Why: Please call or go by to follow up on patient's medicaid Contact information: Address: [address] [place], [place] [address] Hours:  Open  Closes 5?PM Phone: [phone]        [facility]. Schedule an appointment as soon as possible for a visit in 1 month(s).   Specialty: Neurology Why: stroke clinic Contact information: [address] [place] [phone]        de [place], Blondell Reveal, MD Follow up on 12/29/2021.   Specialty: Family Medicine Why: 8:10am Contact information: [place] [address] [phone]         Buford Dresser, MD Follow up on 01/03/2022.   Specialty:  Cardiology Why: [facility] - see address - keep follow-up as scheduled on Monday Jan 03, 2022 9:40 AM (Arrive by 9:25 AM). Contact information: [address] [place] [address] [phone]                Subjective: Interpretor is son at bedside per pt preference. She feels well, wants to go home, and has walked around the room several times. No headache currently. No chest pain.  Discharge Exam: Filed Weights   12/08/21 1633  Weight: 48.5 kg  Pleasant, alert, oriented in no distress Clear, nonlabored Irreg irreg, very subtle diastolic murmur at the apex on left lateral decubitus position, no edema or JVD Moves all extremities with 4/5 strength on LUE LLE when tested compared to right.   Condition at discharge: stable  The results of significant diagnostics from this hospitalization (including imaging, microbiology, ancillary and laboratory) are listed below for reference.   Imaging Studies: CT ANGIO HEAD NECK W WO CM  Result Date: 12/09/2021 CLINICAL DATA:  Altered mental status, chest pain, weakness EXAM: CT ANGIOGRAPHY HEAD AND NECK TECHNIQUE: Multidetector CT imaging of the head and neck was performed using the standard protocol during bolus administration of intravenous contrast. Multiplanar CT image reconstructions and MIPs  were obtained to evaluate the vascular anatomy. Carotid stenosis measurements (when applicable) are obtained utilizing NASCET criteria, using the distal internal carotid diameter as the denominator. RADIATION DOSE REDUCTION: This exam was performed according to the departmental dose-optimization program which includes automated exposure control, adjustment of the mA and/or kV according to patient size and/or use of iterative reconstruction technique. CONTRAST:  97mL OMNIPAQUE IOHEXOL 350 MG/ML SOLN COMPARISON:  Same day brain MRI, noncontrast CT head dated 1 day prior FINDINGS: CT HEAD FINDINGS Brain: There is no evidence of acute  intracranial hemorrhage, extra-axial fluid collection, or acute infarct. The remote infarct in the right MCA distribution is again seen. There is unchanged ex vacuo dilatation of the right lateral ventricle. The ventricles are otherwise normal in size and appearance. The background parenchymal volume is otherwise normal. There is no mass lesion.  There is no midline shift. Vascular: See below Skull: Normal. Negative for fracture or focal lesion. Sinuses and orbits: The paranasal sinuses are clear. The globes and orbits are unremarkable. Other: None. Review of the MIP images confirms the above findings CTA NECK FINDINGS Aortic arch: There is mild calcified atherosclerotic plaque in the aortic arch. The origins of the major branch vessels are patent. The subclavian arteries are patent with non flow limiting plaque just after the takeoff of the common carotid artery on the right. Right carotid system: The right common, internal, and external carotid arteries are patent, without hemodynamically significant stenosis or occlusion. There is no dissection or aneurysm. Left carotid system: There is scattered mild calcified plaque in the left common carotid artery without hemodynamically significant stenosis. The left internal and external carotid arteries are patent, without hemodynamically significant stenosis or occlusion. There is no dissection or aneurysm. Vertebral arteries: The vertebral arteries are patent, without hemodynamically significant stenosis or occlusion. There is no dissection or aneurysm. Skeleton: There is mild degenerative change of the cervical spine at C4-C5. There is no acute osseous abnormality or aggressive osseous lesion. There is no visible canal hematoma. Other neck: The soft tissues are unremarkable. Upper chest: There is patchy geographic ground-glass opacity in the lung apices which may reflect air trapping or multifocal infection. There is a 9 mm enhancing lesion in the AP window. Review of  the MIP images confirms the above findings CTA HEAD FINDINGS Anterior circulation: There is mild calcified plaque in the intracranial ICAs without hemodynamically significant stenosis or occlusion. The bilateral MCAs are patent. The bilateral ACAs are patent. The anterior communicating artery is normal. There is no aneurysm or AVM. Posterior circulation: The bilateral V4 segments are patent. PICA is identified bilaterally. The basilar artery is patent. The bilateral PCAs are patent. A small right posterior communicating artery is identified. The left posterior communicating artery is not definitely seen. There is no aneurysm or AVM. Venous sinuses: Patent. Anatomic variants: None. Review of the MIP images confirms the above findings IMPRESSION: 1. No acute intracranial pathology. Unchanged remote right MCA territory infarct. 2. Patent vasculature of the head and neck. 3. Mosaic attenuation in the lung apices suggestive of air trapping/small airway disease. 4. Probable lymphadenopathy in the AP window. Recommend contrast enhanced CT of the chest for further evaluation. This may be performed on a nonemergent basis as indicated. Electronically Signed   By: Valetta Mole M.D.   On: 12/09/2021 12:27   DG Chest 2 View  Result Date: 12/08/2021 CLINICAL DATA:  Chest pain. Stroke this morning around 4 a.m. upon waking up. Weakness. Unable to speak. EXAM: CHEST - 2  VIEW COMPARISON:  Chest two views 10/21/2021 FINDINGS: Cardiac silhouette is again mildly enlarged. Mediastinal contours are within normal limits. There is again increased lucency suggesting emphysematous change. Mild chronic bilateral mid and lower lung interstitial thickening appears similar to prior. No definite focal airspace opacity to indicate pneumonia. No pleural effusion or pneumothorax. Moderate multilevel degenerative disc changes of the midthoracic spine. IMPRESSION: Mild bilateral mid and lower lung interstitial thickening is similar to prior. This  may represent mild interstitial pulmonary edema or viral infection in the acute setting. It may represent scarring in the chronic setting. Electronically Signed   By: Yvonne Kendall M.D.   On: 12/08/2021 18:34   CT HEAD WO CONTRAST (5MM)  Result Date: 12/08/2021 CLINICAL DATA:  Delirium EXAM: CT HEAD WITHOUT CONTRAST TECHNIQUE: Contiguous axial images were obtained from the base of the skull through the vertex without intravenous contrast. RADIATION DOSE REDUCTION: This exam was performed according to the departmental dose-optimization program which includes automated exposure control, adjustment of the mA and/or kV according to patient size and/or use of iterative reconstruction technique. COMPARISON:  None. FINDINGS: Brain: No acute territorial infarction, hemorrhage, or intracranial mass. Moderate encephalomalacia within the right frontal lobe consistent with remote infarct. Encephalomalacia involving right insula and sub insula. Nonenlarged ventricles. Vascular: No hyperdense vessels.  Carotid vascular calcification. Skull: Normal. Negative for fracture or focal lesion. Sinuses/Orbits: No acute finding. Other: None IMPRESSION: 1. No CT evidence for acute intracranial abnormality. 2. Remote right MCA infarct. Electronically Signed   By: Donavan Foil M.D.   On: 12/08/2021 17:18   MR BRAIN WO CONTRAST  Result Date: 12/09/2021 CLINICAL DATA:  Provided history: Transient ischemic attack. EXAM: MRI HEAD WITHOUT CONTRAST TECHNIQUE: Multiplanar, multiecho pulse sequences of the brain and surrounding structures were obtained without intravenous contrast. COMPARISON:  Head CT 12/08/2021. FINDINGS: Brain: Redemonstrated moderate-sized chronic cortical/subcortical right MCA territory infarct involving portions of the right frontal and parietal lobes as well as posterior right insula. Mild chronic hemosiderin deposition at this site. Background cerebral volume appears normal for age. Mild multifocal T2 FLAIR  hyperintense signal abnormality elsewhere within the cerebral white matter, nonspecific but compatible with chronic small vessel ischemic disease. This includes a small chronic lacunar infarct within left parietal white matter (series 11, image 16). There is no acute infarct. No evidence of an intracranial mass. No extra-axial fluid collection. No midline shift. Vascular: Maintained flow voids within the proximal large arterial vessels. Skull and upper cervical spine: No focal suspicious marrow lesion. Sinuses/Orbits: Visualized orbits show no acute finding. No significant paranasal sinus disease. Other: Trace fluid within the right mastoid air cells. IMPRESSION: No evidence of acute intracranial abnormality. Moderate-sized chronic cortical/subcortical right MCA territory infarct. Background mild cerebral white matter chronic small vessel ischemic disease, including a small chronic lacunar infarct the left parietal white matter. Trace fluid within the right mastoid air cells. Electronically Signed   By: Kellie Simmering D.O.   On: 12/09/2021 11:55   ECHOCARDIOGRAM COMPLETE  Result Date: 12/09/2021    ECHOCARDIOGRAM REPORT   Patient Name:   LORRENA VANSCOYK Date of Exam: 12/09/2021 Medical Rec #:  [id number]    Height:       57.5 in Accession #:    [id number]   Weight:       107.0 lb Date of Birth:  18-Apr-1961    BSA:          1.388 m Patient Age:    75 years     BP:  152/87 mmHg Patient Gender: F            HR:           83 bpm. Exam Location:  Inpatient Procedure: Limited Echo, Cardiac Doppler and Color Doppler Indications:    CVA  History:        Patient has no prior history of Echocardiogram examinations.                 Stroke, Arrythmias:Atrial Fibrillation; Risk Factors:Diabetes.  Sonographer:    Luisa Hart RDCS Referring Phys: [place]  1. There is severe, calcific mitral stenosis with average mean gradient 25mmHg at HR 67. MVA by VTI 1.0cm2.  2. Left ventricular ejection fraction, by  estimation, is 45 to 50%. The left ventricle has mildly decreased function. Left ventricular endocardial border not optimally defined to evaluate regional wall motion. Diastolic function is indeterminant due to Afib.  3. Right ventricular systolic function is normal. The right ventricular size is normal.  4. Left atrial size was severely dilated.  5. Right atrial size was moderately dilated.  6. The mitral valve is abnormal. Mild mitral valve regurgitation. Severe mitral stenosis. The mean mitral valve gradient is 8.5 mmHg. Severe mitral annular calcification.  7. The aortic valve is tricuspid. There is mild calcification of the aortic valve. There is mild thickening of the aortic valve. Aortic valve regurgitation is not visualized. Aortic valve sclerosis/calcification is present, without any evidence of aortic stenosis.  8. The inferior vena cava is normal in size with greater than 50% respiratory variability, suggesting right atrial pressure of 3 mmHg. Comparison(s): No prior Echocardiogram. Conclusion(s)/Recommendation(s): No intracardiac source of embolism detected on this transthoracic study. Consider a transesophageal echocardiogram to exclude cardiac source of embolism if clinically indicated. FINDINGS  Left Ventricle: Left ventricular ejection fraction, by estimation, is 45 to 50%. The left ventricle has mildly decreased function. Left ventricular endocardial border not optimally defined to evaluate regional wall motion. The left ventricular internal cavity size was normal in size. There is no left ventricular hypertrophy. Diastolic function is indeterminant due to Afib. Right Ventricle: The right ventricular size is normal. No increase in right ventricular wall thickness. Right ventricular systolic function is normal. Left Atrium: Left atrial size was severely dilated. Right Atrium: Right atrial size was moderately dilated. Pericardium: There is no evidence of pericardial effusion. Mitral Valve: MVA by VTI  1.0cm2. The mitral valve is abnormal. There is moderate thickening of the mitral valve leaflet(s). There is moderate calcification of the mitral valve leaflet(s). Severe mitral annular calcification. Mild mitral valve regurgitation. Severe mitral valve stenosis. MV peak gradient, 14.7 mmHg. The mean mitral valve gradient is 8.5 mmHg. Tricuspid Valve: The tricuspid valve is normal in structure. Tricuspid valve regurgitation is trivial. Aortic Valve: The aortic valve is tricuspid. There is mild calcification of the aortic valve. There is mild thickening of the aortic valve. Aortic valve regurgitation is not visualized. Aortic valve sclerosis/calcification is present, without any evidence of aortic stenosis. Aortic valve mean gradient measures 3.0 mmHg. Aortic valve peak gradient measures 6.0 mmHg. Aortic valve area, by VTI measures 0.94 cm. Pulmonic Valve: The pulmonic valve was normal in structure. Pulmonic valve regurgitation is trivial. Aorta: The aortic root is normal in size and structure. Venous: The inferior vena cava is normal in size with greater than 50% respiratory variability, suggesting right atrial pressure of 3 mmHg. IAS/Shunts: The atrial septum is grossly normal.  LEFT VENTRICLE PLAX 2D LVIDd:  3.30 cm LVIDs:         2.70 cm LV PW:         1.10 cm LV IVS:        0.70 cm LVOT diam:     1.60 cm LV SV:         22 LV SV Index:   16 LVOT Area:     2.01 cm  LV Volumes (MOD) LV vol d, MOD A2C: 29.4 ml LV vol d, MOD A4C: 27.2 ml LV vol s, MOD A2C: 15.6 ml LV vol s, MOD A4C: 13.1 ml LV SV MOD A2C:     13.8 ml LV SV MOD A4C:     27.2 ml LV SV MOD BP:      13.6 ml RIGHT VENTRICLE RV Basal diam:  2.10 cm RV Mid diam:    1.70 cm LEFT ATRIUM             Index        RIGHT ATRIUM           Index LA diam:        4.20 cm 3.03 cm/m   RA Area:     14.30 cm LA Vol (A2C):   73.1 ml 52.68 ml/m  RA Volume:   33.20 ml  23.92 ml/m LA Vol (A4C):   35.3 ml 25.44 ml/m LA Biplane Vol: 51.5 ml 37.11 ml/m  AORTIC  VALVE                    PULMONIC VALVE AV Area (Vmax):    0.96 cm     PV Vmax:       0.78 m/s AV Area (Vmean):   0.92 cm     PV Vmean:      54.700 cm/s AV Area (VTI):     0.94 cm     PV VTI:        0.167 m AV Vmax:           122.00 cm/s  PV Peak grad:  2.4 mmHg AV Vmean:          82.000 cm/s  PV Mean grad:  1.0 mmHg AV VTI:            0.233 m AV Peak Grad:      6.0 mmHg AV Mean Grad:      3.0 mmHg LVOT Vmax:         58.45 cm/s LVOT Vmean:        37.600 cm/s LVOT VTI:          0.109 m LVOT/AV VTI ratio: 0.47  AORTA Ao Root diam: 2.10 cm Ao Asc diam:  2.70 cm MITRAL VALVE                TRICUSPID VALVE MV Area (PHT): 1.95 cm     TR Peak grad:   21.0 mmHg MV Area VTI:   0.66 cm     TR Vmax:        229.00 cm/s MV Peak grad:  14.7 mmHg MV Mean grad:  8.5 mmHg     SHUNTS MV Vmax:       1.92 m/s     Systemic VTI:  0.11 m MV Vmean:      134.0 cm/s   Systemic Diam: 1.60 cm MV E velocity: 188.00 cm/s Gwyndolyn Kaufman MD Electronically signed by Gwyndolyn Kaufman MD Signature Date/Time: 12/09/2021/3:28:43 PM    Final     Microbiology: Results for orders placed  or performed during the hospital encounter of 12/08/21  Resp Panel by RT-PCR (Flu A&B, Covid) Nasopharyngeal Swab     Status: None   Collection Time: 12/09/21  1:13 PM   Specimen: Nasopharyngeal Swab; Nasopharyngeal(NP) swabs in vial transport medium  Result Value Ref Range Status   SARS Coronavirus 2 by RT PCR NEGATIVE NEGATIVE Final    Comment: (NOTE) SARS-CoV-2 target nucleic acids are NOT DETECTED.  The SARS-CoV-2 RNA is generally detectable in upper respiratory specimens during the acute phase of infection. The lowest concentration of SARS-CoV-2 viral copies this assay can detect is 138 copies/mL. A negative result does not preclude SARS-Cov-2 infection and should not be used as the sole basis for treatment or other patient management decisions. A negative result may occur with  improper specimen collection/handling, submission of specimen  other than nasopharyngeal swab, presence of viral mutation(s) within the areas targeted by this assay, and inadequate number of viral copies(<138 copies/mL). A negative result must be combined with clinical observations, patient history, and epidemiological information. The expected result is Negative.  Fact Sheet for Patients:  EntrepreneurPulse.com.au  Fact Sheet for Healthcare Providers:  IncredibleEmployment.be  This test is no t yet approved or cleared by the [place] FDA and  has been authorized for detection and/or diagnosis of SARS-CoV-2 by FDA under an Emergency Use Authorization (EUA). This EUA will remain  in effect (meaning this test can be used) for the duration of the COVID-19 declaration under Section 564(b)(1) of the Act, 21 U.S.C.section 360bbb-3(b)(1), unless the authorization is terminated  or revoked sooner.       Influenza A by PCR NEGATIVE NEGATIVE Final   Influenza B by PCR NEGATIVE NEGATIVE Final    Comment: (NOTE) The Xpert Xpress SARS-CoV-2/FLU/RSV plus assay is intended as an aid in the diagnosis of influenza from Nasopharyngeal swab specimens and should not be used as a sole basis for treatment. Nasal washings and aspirates are unacceptable for Xpert Xpress SARS-CoV-2/FLU/RSV testing.  Fact Sheet for Patients: EntrepreneurPulse.com.au  Fact Sheet for Healthcare Providers: IncredibleEmployment.be  This test is not yet approved or cleared by the [place] FDA and has been authorized for detection and/or diagnosis of SARS-CoV-2 by FDA under an Emergency Use Authorization (EUA). This EUA will remain in effect (meaning this test can be used) for the duration of the COVID-19 declaration under Section 564(b)(1) of the Act, 21 U.S.C. section 360bbb-3(b)(1), unless the authorization is terminated or revoked.  Performed at [facility], [place] [address], [place],  [place] [address]     Labs: CBC: Recent Labs  Lab 12/08/21 1657 12/10/21 0824 12/11/21 0449  WBC 6.8 5.6 5.9  NEUTROABS 3.8  --   --   HGB 13.6 13.6 12.6  HCT 42.6 42.1 38.4  MCV 88.6 88.1 87.5  PLT 169 138* [id number]*   Basic Metabolic Panel: Recent Labs  Lab 12/08/21 1657  NA 138  K 4.3  CL 107  CO2 23  GLUCOSE 119*  BUN 15  CREATININE 0.80  CALCIUM 9.4   Liver Function Tests: No results for input(s): AST, ALT, ALKPHOS, BILITOT, PROT, ALBUMIN in the last 168 hours. CBG: Recent Labs  Lab 12/09/21 0007  GLUCAP 128*    Discharge time spent: greater than 30 minutes.  Signed: Patrecia Pour, MD [facility] 12/12/2021

## 2021-12-11 NOTE — Progress Notes (Signed)
Pt being d/c.  PIVs removed Discharge information given pt and son.  Pt verb understanding and had no further questions  Tx pt via wheelchair.

## 2021-12-15 ENCOUNTER — Other Ambulatory Visit: Payer: Self-pay

## 2021-12-15 ENCOUNTER — Emergency Department (HOSPITAL_COMMUNITY)
Admission: EM | Admit: 2021-12-15 | Discharge: 2021-12-15 | Disposition: A | Discharge status: Home / Self Care | Payer: 59 | Attending: Emergency Medicine | Admitting: Emergency Medicine

## 2021-12-15 ENCOUNTER — Emergency Department (HOSPITAL_COMMUNITY): Payer: 59

## 2021-12-15 ENCOUNTER — Encounter (HOSPITAL_COMMUNITY): Payer: Self-pay

## 2021-12-15 DIAGNOSIS — Z79899 Other long term (current) drug therapy: Secondary | ICD-10-CM | POA: Insufficient documentation

## 2021-12-15 DIAGNOSIS — R29810 Facial weakness: Secondary | ICD-10-CM | POA: Diagnosis present

## 2021-12-15 DIAGNOSIS — Z20822 Contact with and (suspected) exposure to covid-19: Secondary | ICD-10-CM | POA: Insufficient documentation

## 2021-12-15 DIAGNOSIS — Z7982 Long term (current) use of aspirin: Secondary | ICD-10-CM | POA: Insufficient documentation

## 2021-12-15 DIAGNOSIS — Z7901 Long term (current) use of anticoagulants: Secondary | ICD-10-CM | POA: Diagnosis not present

## 2021-12-15 DIAGNOSIS — G459 Transient cerebral ischemic attack, unspecified: Secondary | ICD-10-CM | POA: Insufficient documentation

## 2021-12-15 LAB — RESP PANEL BY RT-PCR (FLU A&B, COVID) ARPGX2
Influenza A by PCR: NEGATIVE
Influenza B by PCR: NEGATIVE
SARS Coronavirus 2 by RT PCR: NEGATIVE

## 2021-12-15 LAB — CBC
HCT: 43 % (ref 36.0–46.0)
Hemoglobin: 14.1 g/dL (ref 12.0–15.0)
MCH: 28.7 pg (ref 26.0–34.0)
MCHC: 32.8 g/dL (ref 30.0–36.0)
MCV: 87.6 fL (ref 80.0–100.0)
Platelets: 169 10*3/uL (ref 150–400)
RBC: 4.91 MIL/uL (ref 3.87–5.11)
RDW: 13.2 % (ref 11.5–15.5)
WBC: 6.5 10*3/uL (ref 4.0–10.5)
nRBC: 0 % (ref 0.0–0.2)

## 2021-12-15 LAB — COMPREHENSIVE METABOLIC PANEL
ALT: 40 U/L (ref 0–44)
AST: 39 U/L (ref 15–41)
Albumin: 4 g/dL (ref 3.5–5.0)
Alkaline Phosphatase: 80 U/L (ref 38–126)
Anion gap: 11 (ref 5–15)
BUN: 12 mg/dL (ref 8–23)
CO2: 24 mmol/L (ref 22–32)
Calcium: 9.6 mg/dL (ref 8.9–10.3)
Chloride: 102 mmol/L (ref 98–111)
Creatinine, Ser: 0.8 mg/dL (ref 0.44–1.00)
GFR, Estimated: >60 mL/min (ref >60)
Glucose, Bld: 103 mg/dL — ABNORMAL HIGH (ref 70–99)
Potassium: 4.4 mmol/L (ref 3.5–5.1)
Sodium: 137 mmol/L (ref 135–145)
Total Bilirubin: 0.5 mg/dL (ref 0.3–1.2)
Total Protein: 7.4 g/dL (ref 6.5–8.1)

## 2021-12-15 LAB — DIFFERENTIAL
Abs Immature Granulocytes: 0.01 10*3/uL (ref 0.00–0.07)
Basophils Absolute: 0.1 10*3/uL (ref 0.0–0.1)
Basophils Relative: 1 %
Eosinophils Absolute: 0.3 10*3/uL (ref 0.0–0.5)
Eosinophils Relative: 5 %
Immature Granulocytes: 0 %
Lymphocytes Relative: 32 %
Lymphs Abs: 2.1 10*3/uL (ref 0.7–4.0)
Monocytes Absolute: 0.4 10*3/uL (ref 0.1–1.0)
Monocytes Relative: 6 %
Neutro Abs: 3.7 10*3/uL (ref 1.7–7.7)
Neutrophils Relative %: 56 %

## 2021-12-15 LAB — PROTIME-INR
INR: 3.2 — ABNORMAL HIGH (ref 0.8–1.2)
Prothrombin Time: 32.6 seconds — ABNORMAL HIGH (ref 11.4–15.2)

## 2021-12-15 LAB — ETHANOL: Alcohol, Ethyl (B): <10 mg/dL (ref <10)

## 2021-12-15 LAB — CBG MONITORING, ED: Glucose-Capillary: 106 mg/dL — ABNORMAL HIGH (ref 70–99)

## 2021-12-15 LAB — APTT: aPTT: 56 seconds — ABNORMAL HIGH (ref 24–36)

## 2021-12-15 NOTE — ED Notes (Signed)
Patient transported to CT 

## 2021-12-15 NOTE — ED Triage Notes (Signed)
Pt alerted family at 0400 that she had left arm weakness and left side facial droop. Per family, it resolved in ten minutes. Pt with history of left arm weakness. LSW 2200 12/14/21.

## 2021-12-15 NOTE — ED Notes (Signed)
Unsuccessful IV attempt/blood draw.  Tori RN asked to attempt.

## 2021-12-15 NOTE — ED Provider Notes (Signed)
Patient remains back at baseline.  No longer having any sensation of numbness or weakness or dizziness.  Work-up is unremarkable CT imaging shows prior stroke but no acute findings noted.  Patient's Coumadin appears therapeutic.  Patient discharged home.  Had a lengthy discussion with patient's son, advised him to follow-up with her neurologist this week.  Advised return for any persistent symptoms fevers or any additional concerns return back to the ER otherwise follow-up with a neurologist within the week.   Luna Fuse, MD 12/15/21 [phone]

## 2021-12-15 NOTE — Discharge Instructions (Signed)
Call your primary care doctor or specialist as discussed in the next 2-3 days.   Return immediately back to the ER if:  Your symptoms worsen within the next 12-24 hours. You develop new symptoms such as new fevers, persistent vomiting, new pain, shortness of breath, or new weakness or numbness, or if you have any other concerns.  

## 2021-12-15 NOTE — ED Provider Notes (Signed)
[facility] EMERGENCY DEPARTMENT Provider Note   CSN: [id number] Arrival date & time: 12/15/21  [id number]     History  Chief Complaint  Patient presents with   Facial Droop    0400 left side facial droop per family resolved in ten minutes    Christy Poole is a 61 y.o. female.  Patient presents to the emergency department for evaluation of left facial and left arm weakness.  Patient awakened at 4 AM with symptoms.  He woke up her son who saw the increased weakness.  He reports that it lasted 5 to 10 minutes and then resolved.  Patient was admitted this week for similar symptoms and worked up, no stroke found.      Home Medications Prior to Admission medications   Medication Sig Start Date End Date Taking? Authorizing Provider  acetaminophen (TYLENOL) 500 MG tablet Take 1,000 mg by mouth every 6 (six) hours as needed for mild pain, headache or fever.   Yes [provider]  aspirin EC 81 MG tablet Take 81 mg by mouth daily. Swallow whole.   Yes [provider]  atorvastatin (LIPITOR) 80 MG tablet Take 80 mg by mouth daily.   Yes [provider]  levothyroxine (SYNTHROID) 50 MCG tablet Take 50 mcg by mouth at bedtime.   Yes [provider]  metoprolol succinate (TOPROL-XL) 25 MG 24 hr tablet Take 1 tablet (25 mg total) by mouth daily. Patient taking differently: Take 25 mg by mouth every evening. 12/12/21  Yes Patrecia Pour, MD  warfarin (COUMADIN) 2.5 MG tablet Take 5 mg by mouth at bedtime.   Yes [provider]      Allergies    Patient has no known allergies.    Review of Systems   Review of Systems  Neurological:  Positive for facial asymmetry and weakness.   Physical Exam Updated Vital Signs BP (!) 141/64 (BP Location: Right Arm)    Pulse (!) 58    Temp 98 F (36.7 C) (Oral)    Resp 18    Ht 4\' 9"  (1.448 m)    Wt 45 kg    SpO2 99%    BMI 21.47 kg/m  Physical Exam Vitals and nursing note reviewed.   Constitutional:      General: She is not in acute distress.    Appearance: She is well-developed.  HENT:     Head: Normocephalic and atraumatic.     Mouth/Throat:     Mouth: Mucous membranes are moist.  Eyes:     General: Vision grossly intact. Gaze aligned appropriately.     Extraocular Movements: Extraocular movements intact.     Conjunctiva/sclera: Conjunctivae normal.  Cardiovascular:     Rate and Rhythm: Normal rate and regular rhythm.     Pulses: Normal pulses.     Heart sounds: Normal heart sounds, S1 normal and S2 normal. No murmur heard.   No friction rub. No gallop.  Pulmonary:     Effort: Pulmonary effort is normal. No respiratory distress.     Breath sounds: Normal breath sounds.  Abdominal:     General: Bowel sounds are normal.     Palpations: Abdomen is soft.     Tenderness: There is no abdominal tenderness. There is no guarding or rebound.     Hernia: No hernia is present.  Musculoskeletal:        General: No swelling.     Cervical back: Full passive range of motion without pain, normal range  of motion and neck supple. No spinous process tenderness or muscular tenderness. Normal range of motion.     Right lower leg: No edema.     Left lower leg: No edema.  Skin:    General: Skin is warm and dry.     Capillary Refill: Capillary refill takes less than 2 seconds.     Findings: No ecchymosis, erythema, rash or wound.  Neurological:     General: No focal deficit present.     Mental Status: She is alert and oriented to person, place, and time.     GCS: GCS eye subscore is 4. GCS verbal subscore is 5. GCS motor subscore is 6.     Cranial Nerves: Facial asymmetry (Slight facial droop on the left) present.     Sensory: Sensation is intact.     Motor: Motor function is intact.     Coordination: Coordination is intact.     Comments: Slight pronator drift left upper extremity  Psychiatric:        Attention and Perception: Attention normal.        Mood and Affect: Mood  normal.        Speech: Speech normal.        Behavior: Behavior normal.    ED Results / Procedures / Treatments   Labs (all labs ordered are listed, but only abnormal results are displayed) Labs Reviewed  CBG MONITORING, ED - Abnormal; Notable for the following components:      Result Value   Glucose-Capillary 106 (*)    All other components within normal limits  ETHANOL  PROTIME-INR  APTT  CBC  DIFFERENTIAL  COMPREHENSIVE METABOLIC PANEL  URINALYSIS, ROUTINE W REFLEX MICROSCOPIC    EKG None  Radiology No results found.  Procedures Procedures    Medications Ordered in ED Medications - No data to display  ED Course/ Medical Decision Making/ A&P                           Medical Decision Making Amount and/or Complexity of Data Reviewed Independent Historian: caregiver    Details: Son is her translator External Data Reviewed: labs, radiology and notes. Labs: ordered. Decision-making details documented in ED Course. Radiology: ordered and independent interpretation performed. Decision-making details documented in ED Course.   Presents to the emergency department for evaluation of increased left facial droop and left arm weakness that lasted for 5 to 10 minutes.  Records reviewed from previous hospitalization this week.  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  Work-up did not show any new stroke, labeled as a TIA.  As patient has had rapidly resolved symptoms, likely TIA, rule out new stroke.  Work-up initiated.  Will sign out to oncoming ER physician to follow-up on results and likely consult neurology for further guidance.        Final Clinical Impression(s) / ED Diagnoses Final diagnoses:  TIA (transient ischemic attack)    Rx / DC Orders ED Discharge Orders     None         Jenafer Winterton, Gwenyth Allegra, MD 12/15/21 [phone]

## 2021-12-16 ENCOUNTER — Telehealth (HOSPITAL_BASED_OUTPATIENT_CLINIC_OR_DEPARTMENT_OTHER): Payer: Self-pay

## 2021-12-16 NOTE — Telephone Encounter (Signed)
Results released by Dr. de Peru and reviewed by patient via MyChart Instructed patient to contact the office with any questions or concerns. Called patient to confirm that labs were reviewed Patients son verbalized the understanding of labs and recommendatio

## 2021-12-16 NOTE — Telephone Encounter (Signed)
-----   Message from Hosie Poisson Peru, MD sent at 12/02/2021 11:37 AM EST ----- White blood cell and red blood cell counts are normal with normal hemoglobin.  Electrolytes, kidney function and liver function are all normal.  INR is within therapeutic range, continue with same dose of warfarin.  Cholesterol panel is normal.  Thyroid function is normal, can continue with same dose of levothyroxine.  Hemoglobin A1c is 6.6% which is consistent with diagnosis of diabetes.  This diagnosis and recommended management can be discussed further at follow-up visit.

## 2021-12-17 ENCOUNTER — Other Ambulatory Visit: Payer: Self-pay

## 2021-12-17 ENCOUNTER — Ambulatory Visit (INDEPENDENT_AMBULATORY_CARE_PROVIDER_SITE_OTHER): Payer: 59

## 2021-12-17 DIAGNOSIS — Z8673 Personal history of transient ischemic attack (TIA), and cerebral infarction without residual deficits: Secondary | ICD-10-CM | POA: Diagnosis not present

## 2021-12-17 DIAGNOSIS — Z7901 Long term (current) use of anticoagulants: Secondary | ICD-10-CM | POA: Diagnosis not present

## 2021-12-17 DIAGNOSIS — I4821 Permanent atrial fibrillation: Secondary | ICD-10-CM

## 2021-12-17 LAB — POCT INR: INR: 3.7 — AB (ref 2.0–3.0)

## 2021-12-17 NOTE — Patient Instructions (Addendum)
Description   Hold today's dose and then continue taking Warfarin 5mg  daily (2 tablets)  Be consistent with greens each week.  Recheck INR in 1 week. Call Coumadin Clinic if you have any upcoming procedures or start any new medications. Coumadin Clinic [phone]  (Goal 2.5-3.0 per Discharge Summary on 12/08/21)           A full discussion of the nature of anticoagulants has been carried out.  A benefit risk analysis has been presented to the patient, so that they understand the justification for choosing anticoagulation at this time. The need for frequent and regular monitoring, precise dosage adjustment and compliance is stressed.  Side effects of potential bleeding are discussed.  The patient should avoid any OTC items containing aspirin or ibuprofen, and should avoid great swings in general diet.  Avoid alcohol consumption.  Call if any signs of abnormal bleeding.

## 2021-12-24 ENCOUNTER — Other Ambulatory Visit: Payer: Self-pay

## 2021-12-24 ENCOUNTER — Ambulatory Visit (INDEPENDENT_AMBULATORY_CARE_PROVIDER_SITE_OTHER): Payer: 59 | Admitting: *Deleted

## 2021-12-24 DIAGNOSIS — Z7901 Long term (current) use of anticoagulants: Secondary | ICD-10-CM | POA: Diagnosis not present

## 2021-12-24 DIAGNOSIS — I4821 Permanent atrial fibrillation: Secondary | ICD-10-CM

## 2021-12-24 DIAGNOSIS — Z8673 Personal history of transient ischemic attack (TIA), and cerebral infarction without residual deficits: Secondary | ICD-10-CM | POA: Diagnosis not present

## 2021-12-24 DIAGNOSIS — Z5181 Encounter for therapeutic drug level monitoring: Secondary | ICD-10-CM | POA: Diagnosis not present

## 2021-12-24 LAB — POCT INR: INR: 3.2 — AB (ref 2.0–3.0)

## 2021-12-24 NOTE — Patient Instructions (Signed)
Description   ?START taking warfarin 2 tablets daily EXCEPT for 1 tablet on Fridays. Recheck INR in 1 week.  Call Coumadin Clinic if you have any upcoming procedures or start any new medications. Coumadin Clinic [phone] ? ?(Goal 2.5-3.0 per Discharge Summary on 12/08/21) ?  ? ? ?

## 2021-12-29 ENCOUNTER — Encounter (HOSPITAL_BASED_OUTPATIENT_CLINIC_OR_DEPARTMENT_OTHER): Payer: Self-pay | Admitting: Family Medicine

## 2021-12-29 ENCOUNTER — Other Ambulatory Visit: Payer: Self-pay

## 2021-12-29 ENCOUNTER — Other Ambulatory Visit (HOSPITAL_BASED_OUTPATIENT_CLINIC_OR_DEPARTMENT_OTHER): Payer: Self-pay | Admitting: Family Medicine

## 2021-12-29 ENCOUNTER — Ambulatory Visit (INDEPENDENT_AMBULATORY_CARE_PROVIDER_SITE_OTHER): Payer: 59 | Admitting: Family Medicine

## 2021-12-29 VITALS — BP 130/100 | HR 98 | Ht <= 58 in | Wt 107.4 lb

## 2021-12-29 DIAGNOSIS — E119 Type 2 diabetes mellitus without complications: Secondary | ICD-10-CM | POA: Diagnosis not present

## 2021-12-29 DIAGNOSIS — E785 Hyperlipidemia, unspecified: Secondary | ICD-10-CM | POA: Diagnosis not present

## 2021-12-29 DIAGNOSIS — E039 Hypothyroidism, unspecified: Secondary | ICD-10-CM | POA: Diagnosis not present

## 2021-12-29 DIAGNOSIS — Z8673 Personal history of transient ischemic attack (TIA), and cerebral infarction without residual deficits: Secondary | ICD-10-CM

## 2021-12-29 NOTE — Patient Instructions (Signed)
?  Medication Instructions:  ?Your physician recommends that you continue on your current medications as directed. Please refer to the Current Medication list given to you today. ?--If you need a refill on any your medications before your next appointment, please call your pharmacy first. If no refills are authorized on file call the office.-- ?Lab Work: ?Your physician has recommended that you have lab work today: Urine test ?If you have labs (blood work) drawn today and your tests are completely normal, you will receive your results via MyChart message OR a phone call from our staff.  ?Please ensure you check your voicemail in the event that you authorized detailed messages to be left on a delegated number. If you have any lab test that is abnormal or we need to change your treatment, we will call you to review the results. ? ?Referrals/Procedures/Imaging: ?No referrals ? ?Follow-Up: ?Your next appointment:   ?Your physician recommends that you schedule a follow-up appointment in: 6 week with Dr. de Peru ? ?You will receive a text message or e-mail with a link to a survey about your care and experience with [place] today! We would greatly appreciate your feedback!  ? ?Thanks for letting us be apart of your health journey!!  ?Primary Care and Sports Medicine  ? ?Dr. Marcy Salvo de Peru  ? ?We encourage you to activate your patient portal called "MyChart".  Sign up information is provided on this After Visit Summary.  MyChart is used to connect with patients for Virtual Visits (Telemedicine).  Patients are able to view lab/test results, encounter notes, upcoming appointments, etc.  Non-urgent messages can be sent to your provider as well. To learn more about what you can do with MyChart, please visit --  [email or web address].    ?

## 2021-12-29 NOTE — Assessment & Plan Note (Signed)
Recently panel with good control of cholesterol numbers, LDL is at 36.  She continues with atorvastatin, has been tolerating medication, will continue at this time. ?

## 2021-12-29 NOTE — Assessment & Plan Note (Signed)
Most recent hemoglobin A1c found to be 6.6%.  Consistent with diagnosis of diabetes, indicates adequate control of blood sugars ?Discussed options with patient and her family member.  Discussed possibility of initiating low-dose metformin and gradual titration.  Patient's son indicates that they would wish to hold off at this time to avoid adding additional medication.  He also reports that he was advised while he was at the hospital that she should not begin another medication and should just manage with lifestyle modifications.  He is not specifically aware of who made this recommendation. ?Given diagnosis of diabetes, will refer to ophthalmology for retinopathy screening ?We will check urine microalbumin/creatinine ratio today ?

## 2021-12-29 NOTE — Assessment & Plan Note (Signed)
History of stroke, previously was referred to neurology ?She has had 2 emergency department visits recently, 1 of which led to hospitalization.  She was instructed to follow-up with neurology as an outpatient, however son reports that when he contacted [facility], the soonest appointment they had available was in early April and noticed that she has not established with a neurologist yet ?Recommended for her son to contact neurology office and check occasionally for any cancellations to allow for sooner establishing visit ?

## 2021-12-29 NOTE — Progress Notes (Signed)
? ? ?  Procedures performed today:   ? ?None. ? ?Independent interpretation of notes and tests performed by another provider:  ? ?None. ? ?Brief History, Exam, Impression, and Recommendations:   ? ?BP (!) 130/100   Pulse 98   Ht 4\' 9"  (1.448 m)   Wt 107 lb 6.4 oz (48.7 kg)   SpO2 98%   BMI 23.24 kg/m?  ? ?Hypothyroidism ?Recent TSH was within normal limits, will continue with current dose of levothyroxine and plan for repeat TSH in about 4 to 6 months or sooner as needed should symptoms develop ? ?Diabetes mellitus ([place]) ?Most recent hemoglobin A1c found to be 6.6%.  Consistent with diagnosis of diabetes, indicates adequate control of blood sugars ?Discussed options with patient and her family member.  Discussed possibility of initiating low-dose metformin and gradual titration.  Patient's son indicates that they would wish to hold off at this time to avoid adding additional medication.  He also reports that he was advised while he was at the hospital that she should not begin another medication and should just manage with lifestyle modifications.  He is not specifically aware of who made this recommendation. ?Given diagnosis of diabetes, will refer to ophthalmology for retinopathy screening ?We will check urine microalbumin/creatinine ratio today ? ?History of stroke ?History of stroke, previously was referred to neurology ?She has had 2 emergency department visits recently, 1 of which led to hospitalization.  She was instructed to follow-up with neurology as an outpatient, however son reports that when he contacted [facility], the soonest appointment they had available was in early April and noticed that she has not established with a neurologist yet ?Recommended for her son to contact neurology office and check occasionally for any cancellations to allow for sooner establishing visit ? ?Dyslipidemia ?Recently panel with good control of cholesterol numbers, LDL is at 36.  She continues with  atorvastatin, has been tolerating medication, will continue at this time. ? ?Patient has establishing visit with cardiology later this week on March 10.  Appointment with neurology scheduled for April 6. ? ?Plan for follow-up with me in about 6 to 8 weeks to review recommendations from recent visits with cardiology and neurology. ? ? ?___________________________________________ ?Vesta Wheeland de [place], MD, ABFM, CAQSM ?Primary Care and Sports Medicine ?[place] ?

## 2021-12-29 NOTE — Assessment & Plan Note (Signed)
Recent TSH was within normal limits, will continue with current dose of levothyroxine and plan for repeat TSH in about 4 to 6 months or sooner as needed should symptoms develop ?

## 2021-12-30 LAB — MICROALBUMIN / CREATININE URINE RATIO
Creatinine, Urine: 44.5 mg/dL
Microalb/Creat Ratio: 20 mg/g creat (ref 0–29)
Microalbumin, Urine: 8.8 ug/mL

## 2021-12-31 ENCOUNTER — Ambulatory Visit (INDEPENDENT_AMBULATORY_CARE_PROVIDER_SITE_OTHER): Payer: 59

## 2021-12-31 ENCOUNTER — Other Ambulatory Visit: Payer: Self-pay

## 2021-12-31 DIAGNOSIS — I4821 Permanent atrial fibrillation: Secondary | ICD-10-CM

## 2021-12-31 DIAGNOSIS — Z8673 Personal history of transient ischemic attack (TIA), and cerebral infarction without residual deficits: Secondary | ICD-10-CM | POA: Diagnosis not present

## 2021-12-31 DIAGNOSIS — Z7901 Long term (current) use of anticoagulants: Secondary | ICD-10-CM | POA: Diagnosis not present

## 2021-12-31 LAB — POCT INR: INR: 2.5 (ref 2.0–3.0)

## 2021-12-31 NOTE — Patient Instructions (Signed)
Description   ?Continue on same dosage of Warfarin 2 tablets daily EXCEPT for 1 tablet on Fridays. Recheck INR in 1 week.  Call Coumadin Clinic if you have any upcoming procedures or start any new medications. Coumadin Clinic [phone] ? ?(Goal 2.5-3.0 per Discharge Summary on 12/08/21) ?  ?  ?

## 2022-01-03 ENCOUNTER — Ambulatory Visit (HOSPITAL_BASED_OUTPATIENT_CLINIC_OR_DEPARTMENT_OTHER): Payer: 59 | Admitting: Cardiology

## 2022-01-03 ENCOUNTER — Other Ambulatory Visit: Payer: Self-pay

## 2022-01-03 ENCOUNTER — Encounter (HOSPITAL_BASED_OUTPATIENT_CLINIC_OR_DEPARTMENT_OTHER): Payer: Self-pay | Admitting: Cardiology

## 2022-01-03 VITALS — BP 125/60 | HR 82 | Ht 59.0 in | Wt 109.6 lb

## 2022-01-03 DIAGNOSIS — Z8673 Personal history of transient ischemic attack (TIA), and cerebral infarction without residual deficits: Secondary | ICD-10-CM

## 2022-01-03 DIAGNOSIS — I050 Rheumatic mitral stenosis: Secondary | ICD-10-CM | POA: Diagnosis not present

## 2022-01-03 DIAGNOSIS — Z7901 Long term (current) use of anticoagulants: Secondary | ICD-10-CM

## 2022-01-03 DIAGNOSIS — I4821 Permanent atrial fibrillation: Secondary | ICD-10-CM | POA: Diagnosis not present

## 2022-01-03 DIAGNOSIS — Z7189 Other specified counseling: Secondary | ICD-10-CM

## 2022-01-03 DIAGNOSIS — R0989 Other specified symptoms and signs involving the circulatory and respiratory systems: Secondary | ICD-10-CM

## 2022-01-03 MED ORDER — METOPROLOL SUCCINATE ER 50 MG PO TB24: 50.0000 mg | ORAL_TABLET | Freq: Every day | ORAL | 3 refills | Status: DC

## 2022-01-03 NOTE — Progress Notes (Signed)
**Note Christy-Identified via Obfuscation** Cardiology Office Note:    Date:  01/03/2022   ID:  Christy Poole, DOB 05-02-1961, MRN [id number]  PCP:  Christy Peru, Raymond J, MD  Cardiologist:  Jodelle Red, MD  Referring MD: Christy Peru, Buren Kos, MD   CC: new visit with me/hospital follow up  History of Present Illness:    Christy Poole is a 61 y.o. 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 She was recently seen in the hospital.  She was seen in the ED 10/21/21 for pneumonia. She had a cough and congestion that lasted 1 week that worsened and lost her sense of taste and smell. She was swabbed for COVID and discharged.   She was admitted to the hospital 12/08/21 for TIA. She woke up earlier that morning with a dropping L face and L-arm weakness. Her son mentioned she had progressively worsening L-sided weakness over the past few months. CT and MRI showed no acute stroke but revealed evidence of prior moderate-sized cortical/subcortical right MCA territory infarct as well as white matter chronic small vessel ischemic disease including a lacunar infarct of the left parietal white matter. She was admitted for further observation. She was started on metoprolol, asked to continue coumadin, and follow-up with her PCP and cardiologist. She presented to the ED again 2/22 for L-sided weakness starting at 4 AM that morning. The symptoms had lasted 5 to 10 minutes that resolved without intervention. .  She followed-up with Dr. de Peru 12/29/21 and reported she was not able to schedule an appointment with a neurologist until April. She was otherwise doing well and taking her medications.  Today: She is accompanied by her son who acts as her historian and Nurse, learning disability. Recently, she is doing well. However, she has been experiencing chest pain while doing housework like cooking or doing laundry. She will also occasionally feel short of breath.  The chest pain will cause her to pause, sit, and rest until the pain passes.  She has also been complaining of migraines. Her son will giver her Tylenol but the migraine will persist.   He reports she has had episodes of losing consciousness. Her first episode occurred in 2019 prior to her first incident of stroke at 4 AM. At that time, her whole L-side was weak and numb. She fell and hit her head on the table. She had a small cut but no brain damage due to the fall. Subsequent loss of consciousness episodes occur at similar times in the morning. They sleep together, and he will hold her to prevent her from falling off of the bed. She had 2 episodes of losing consciousness after her most recent hospital visit. Her blood pressure at these recent episodes were around 154/92.  Since her stroke, she has been short of breath with hearing loss and L-sided weakness. For instance, she is unable to cut her nails with her L hand. She has been told the reason for her previous strokes were due to conditions like Afib, high blood pressure, and valve stenosis. Her son reports she underwent balloon angioplasty in the past.   Her son wonders if she needs diabetes medications. She was taking Metformin while they were living in [place].  Her son and his wife take care of her and monitor her medications. They lived in [place] last year. Her son's wife will return from [place] next month. Her son  does not know how to take blood pressure at home but his wife does. She is vegetarian and eats primarily [place] food. She does not exercise regularly.  Her son reports his maternal grandfather had and MI and maternal uncle had bypass surgery. His father also had diabetes. He is his family's only child.   She denies PND, orthopnea, LE edema or unexpected weight gain.   Past Medical History:  Diagnosis Date   Atrial fibrillation (HCC)    Diabetes mellitus without complication (HCC)    Stroke [facility])    Thyroid disease      History reviewed. No pertinent surgical history.  Current Medications: Current Outpatient Medications on File Prior to Visit  Medication Sig   acetaminophen (TYLENOL) 500 MG tablet Take 1,000 mg by mouth every 6 (six) hours as needed for mild pain, headache or fever.   aspirin EC 81 MG tablet Take 81 mg by mouth daily. Swallow whole.   atorvastatin (LIPITOR) 80 MG tablet Take 80 mg by mouth daily.   levothyroxine (SYNTHROID) 50 MCG tablet Take 50 mcg by mouth at bedtime.   warfarin (COUMADIN) 2.5 MG tablet Take 5 mg by mouth at bedtime.   No current facility-administered medications on file prior to visit.     Allergies:   Patient has no known allergies.   Social History   Tobacco Use   Smoking status: Never   Smokeless tobacco: Never  Vaping Use   Vaping Use: Never used  Substance Use Topics   Alcohol use: Never   Drug use: Never    Family History: family history is not on file.  ROS:   Please see the history of present illness.  Additional pertinent ROS: Constitutional: Negative for chills, fever, night sweats, unintentional weight loss  HENT: Negative for ear pain. Positive for hearing loss   Eyes: Negative for loss of vision and eye pain.  Respiratory: Negative for cough, sputum, wheezing. Positive for shortness of breath Cardiovascular: See HPI. Positive for chest pain, and palpitations Gastrointestinal: Negative for abdominal pain, melena, and hematochezia.  Genitourinary: Negative for dysuria and hematuria.  Musculoskeletal: Negative for falls and myalgias.  Skin: Negative for itching and rash.  Neurological: Positive for stroke, syncope, migraines and L-sided weakness Endo/Heme/Allergies: Does not bruise/bleed easily.    EKGs/Labs/Other Studies Reviewed:    The following studies were reviewed today: CT Head 12/15/21 IMPRESSION: 1. No acute intracranial abnormalities. 2. Encephalomalacia in right MCA territory, similar to prior examinations, related to  a remote right MCA territory infarct. 3. Chronic microvascular ischemic changes in the cerebral white matter again noted, as above.  Echo 12/09/21 1. There is severe, calcific mitral stenosis with average mean gradient  at HR 67. MVA by VTI 1.0cm2.   2. Left ventricular ejection fraction, by estimation, is 45 to 50%. The  left ventricle has mildly decreased function. Left ventricular endocardial  border not optimally defined to evaluate regional wall motion. Diastolic  function is indeterminant due to  Afib.   3. Right ventricular systolic function is normal. The right ventricular  size is normal.   4. Left atrial size was severely dilated.   5. Right atrial size was moderately dilated.   6. The mitral valve is abnormal. Mild mitral valve regurgitation. Severe  mitral stenosis. The mean mitral valve gradient is 8.5 mmHg. Severe mitral  annular calcification.   7. The aortic valve is tricuspid. There is mild calcification of the  aortic valve. There is mild thickening of the aortic valve. Aortic  valve  regurgitation is not visualized. Aortic valve sclerosis/calcification is  present, without any evidence of  aortic stenosis.   8. The inferior vena cava is normal in size with greater than 50%  respiratory variability, suggesting right atrial pressure of 3 mmHg.   Comparison(s): No prior Echocardiogram.   CTA Head/Neck 12/09/21 IMPRESSION: 1. No acute intracranial pathology. Unchanged remote right MCA territory infarct. 2. Patent vasculature of the head and neck. 3. Mosaic attenuation in the lung apices suggestive of air trapping/small airway disease. 4. Probable lymphadenopathy in the AP window. Recommend contrast enhanced CT of the chest for further evaluation. This may be performed on a nonemergent basis as indicated.    Brain MRI 12/09/21 IMPRESSION: No evidence of acute intracranial abnormality.   Moderate-sized chronic cortical/subcortical right MCA  territory infarct.   Background mild cerebral white matter chronic small vessel ischemic disease, including a small chronic lacunar infarct the left parietal white matter.   Trace fluid within the right mastoid air cells.  CT Head 12/08/21 IMPRESSION: 1. No CT evidence for acute intracranial abnormality. 2. Remote right MCA infarct.  EKG:  EKG is personally reviewed.   01/03/22: atrial fibrillation at 82 bpm  Recent Labs: 12/01/2021: TSH 2.620 12/15/2021: ALT 40; BUN 12; Creatinine, Ser 0.80; Hemoglobin 14.1; Platelets 169; Potassium 4.4; Sodium 137  Recent Lipid Panel    Component Value Date/Time   CHOL 104 12/01/2021 0920   TRIG 84 12/01/2021 0920   HDL 51 12/01/2021 0920   CHOLHDL 2.0 12/01/2021 0920   LDLCALC 36 12/01/2021 0920    Physical Exam:    VS:  BP 125/60    Pulse 82    Ht 4\' 11"  (1.499 m)    Wt 109 lb 9.6 oz (49.7 kg)    SpO2 99%    BMI 22.14 kg/m     Wt Readings from Last 3 Encounters:  01/03/22 109 lb 9.6 oz (49.7 kg)  12/29/21 107 lb 6.4 oz (48.7 kg)  12/15/21 99 lb 3.3 oz (45 kg)    GEN: Well nourished, well developed in no acute distress HEENT: Normal, moist mucous membranes NECK: No JVD CARDIAC: irregularly irregular rhythm, normal S1 and S2, no rubs or gallops. No murmur appreciated VASCULAR: Radial and DP pulses 2+ bilaterally. No carotid bruits RESPIRATORY:  Bilateral velcro crackles throughout, most prominently anterior, without wheezing or rhonchi ABDOMEN: Soft, non-tender, non-distended MUSCULOSKELETAL:  Ambulates independently SKIN: Warm and dry, no edema NEUROLOGIC:  Alert and oriented x 3. No focal neuro deficits noted. PSYCHIATRIC:  Normal affect    ASSESSMENT:    1. Permanent atrial fibrillation (HCC)   2. History of stroke   3. Long term (current) use of anticoagulants   4. Rheumatic mitral stenosis   5. Abnormal lung sounds   6. Cardiac risk counseling    PLAN:    Rheumatic mitral stenosis -I personally reviewed the study. I  agree with Dr. Erin Hearingroitoru's evaluation that this is more consistent with moderate mitral stenosis, though cannot definitively say it is not severe. She had a prior balloon valvuloplasty in [place] in 2016 or 2017. Given appearance and calcification of the valve, would prefer to avoid repeat balloon valvuloplasty unless absolutely necessary. -she has exertional symptoms. Reported prior minimal CAD on cath about 7 years ago. Resting heart rate on metoprolol succinate 25 mg daily is 82 bpm. Will increase to 50 mg daily to see if exertional symptoms improved with better heart rate control -no syncope since hospitalization -reviewed red flag warning signs that  need immediate medical attention -they will contact me with any new concerns or worsening symptoms  Permanent atrial fibrillation Recent TIA History of CVA in 2019 with left facial and left arm deficits -given that this is valvular atrial fibrillation, needs to be on coumadin (no DOAC) -on aspirin, atorvastatin as well as coumadin, denies bleeding issues  Velcro crackles -I reviewed her CXR. Read suggests emphysematous changes and interstitial thickening -no dedicated CT chest available to me, but visualized portions in the CT head and neck note patchy ground glass opacities. Also noted probable lymphadenopathy in the chest, recommended for nonemergent CT chest with contrast -they have seen Dr. De Peru post discharge. Recommended discussing follow up of these findings with him  Cardiac risk counseling and prevention recommendations: -recommend heart healthy/Mediterranean diet, with whole grains, fruits, vegetable, fish, lean meats, nuts, and olive oil. Limit salt. -recommend moderate walking, 3-5 times/week for 30-50 minutes each session. Aim for at least 150 minutes.week. Goal should be pace of 3 miles/hours, or walking 1.5 miles in 30 minutes -recommend avoidance of tobacco products. Avoid excess alcohol. -ASCVD risk score: The ASCVD Risk score  (Arnett DK, et al., 2019) failed to calculate for the following reasons:   The valid total cholesterol range is 130 to 320 mg/dL    Plan for follow up: 3 months or sooner as needed  Total time of encounter: 46 minutes total time of encounter, including 32 minutes spent in face-to-face patient care. This time includes coordination of care and counseling regarding mitral stenosis and atrial fibrillation. Remainder of non-face-to-face time involved reviewing chart documents/testing relevant to the patient encounter and documentation in the medical record.  Jodelle Red, MD, PhD, [facility]   [facility]    Medication Adjustments/Labs and Tests Ordered: Current medicines are reviewed at length with the patient today.  Concerns regarding medicines are outlined above.  Orders Placed This Encounter  Procedures   EKG 12-Lead   Meds ordered this encounter  Medications   metoprolol succinate (TOPROL-XL) 50 MG 24 hr tablet    Sig: Take 1 tablet (50 mg total) by mouth daily.    Dispense:  90 tablet    Refill:  3    Patient Instructions  Medication Instructions:  1). INCREASE: Metoprolol Succinate to 50 mg daily   *If you need a refill on your cardiac medications before your next appointment, please call your pharmacy*   Lab Work: None ordered today   Testing/Procedures: None ordered today   Follow-Up: At [facility], you and your health needs are our priority.  As part of our continuing mission to provide you with exceptional heart care, we have created designated Provider Care Teams.  These Care Teams include your primary Cardiologist (physician) and Advanced Practice Providers (APPs -  Physician Assistants and Nurse Practitioners) who all work together to provide you with the care you need, when you need it.  We recommend signing up for the patient portal called "MyChart".  Sign up information is provided on this After Visit Summary.  MyChart is used to connect  with patients for Virtual Visits (Telemedicine).  Patients are able to view lab/test results, encounter notes, upcoming appointments, etc.  Non-urgent messages can be sent to your provider as well.   To learn more about what you can do with MyChart, go to [email or web address].    Your next appointment:  April 05, 2022 @ 8:20 am  The format for your next appointment:   In Person  Provider:   Jodelle Red,  MD{      I,Mykaella Javier,acting as a scribe for [employer], MD.,have documented all relevant documentation on the behalf of Jodelle Red, MD,as directed by  Jodelle Red, MD while in the presence of Jodelle Red, MD.  I, Jodelle Red, MD, have reviewed all documentation for this visit. The documentation on 01/03/22 for the exam, diagnosis, procedures, and orders are all accurate and complete.   Signed, Jodelle Red, MD PhD 01/03/2022 1:16 PM    [facility]

## 2022-01-03 NOTE — Patient Instructions (Addendum)
Medication Instructions:  ?1). INCREASE: Metoprolol Succinate to 50 mg daily  ? ?*If you need a refill on your cardiac medications before your next appointment, please call your pharmacy* ? ? ?Lab Work: ?None ordered today ? ? ?Testing/Procedures: ?None ordered today ? ? ?Follow-Up: ?At [facility], you and your health needs are our priority.  As part of our continuing mission to provide you with exceptional heart care, we have created designated Provider Care Teams.  These Care Teams include your primary Cardiologist (physician) and Advanced Practice Providers (APPs -  Physician Assistants and Nurse Practitioners) who all work together to provide you with the care you need, when you need it. ? ?We recommend signing up for the patient portal called "MyChart".  Sign up information is provided on this After Visit Summary.  MyChart is used to connect with patients for Virtual Visits (Telemedicine).  Patients are able to view lab/test results, encounter notes, upcoming appointments, etc.  Non-urgent messages can be sent to your provider as well.   ?To learn more about what you can do with MyChart, go to [email or web address].   ? ?Your next appointment:  ?April 05, 2022 @ 8:20 am ? ?The format for your next appointment:   ?In Person ? ?Provider:   ?Jodelle Red, MD{ ? ? ? ?

## 2022-01-07 ENCOUNTER — Ambulatory Visit (INDEPENDENT_AMBULATORY_CARE_PROVIDER_SITE_OTHER): Payer: 59 | Admitting: *Deleted

## 2022-01-07 ENCOUNTER — Other Ambulatory Visit: Payer: Self-pay

## 2022-01-07 DIAGNOSIS — I4821 Permanent atrial fibrillation: Secondary | ICD-10-CM

## 2022-01-07 DIAGNOSIS — Z8673 Personal history of transient ischemic attack (TIA), and cerebral infarction without residual deficits: Secondary | ICD-10-CM | POA: Diagnosis not present

## 2022-01-07 DIAGNOSIS — Z7901 Long term (current) use of anticoagulants: Secondary | ICD-10-CM

## 2022-01-07 LAB — POCT INR: INR: 3.4 — AB (ref 2.0–3.0)

## 2022-01-07 NOTE — Patient Instructions (Signed)
Description   ?Today take 1/2 tablet of warfarin then continue on same dosage of Warfarin 2 tablets daily EXCEPT for 1 tablet on Fridays. Recheck INR in 1 week.  Call [facility] if you have any upcoming procedures or start any new medications. [facility] [phone] ? ?(Goal 2.5-3.0 per Discharge Summary on 12/08/21) ?  ?  ?

## 2022-01-14 ENCOUNTER — Other Ambulatory Visit: Payer: Self-pay

## 2022-01-14 ENCOUNTER — Ambulatory Visit (INDEPENDENT_AMBULATORY_CARE_PROVIDER_SITE_OTHER): Payer: 59

## 2022-01-14 DIAGNOSIS — I4821 Permanent atrial fibrillation: Secondary | ICD-10-CM

## 2022-01-14 DIAGNOSIS — Z8673 Personal history of transient ischemic attack (TIA), and cerebral infarction without residual deficits: Secondary | ICD-10-CM | POA: Diagnosis not present

## 2022-01-14 DIAGNOSIS — Z7901 Long term (current) use of anticoagulants: Secondary | ICD-10-CM | POA: Diagnosis not present

## 2022-01-14 LAB — POCT INR: INR: 5.9 — AB (ref 2.0–3.0)

## 2022-01-14 NOTE — Patient Instructions (Signed)
-   skip warfarin tonight and tomorrow, then ?- START NEW dosage of Warfarin 2 tablets daily EXCEPT for 1 tablet on MONDAYS, WEDNESDAYS & FRIDAYS.  ?- Recheck INR in 1 week.   ?Call [facility]in Clinic if you have any upcoming procedures or start any new medications. [facility] [phone] ?

## 2022-01-21 ENCOUNTER — Ambulatory Visit (INDEPENDENT_AMBULATORY_CARE_PROVIDER_SITE_OTHER): Payer: 59 | Admitting: *Deleted

## 2022-01-21 DIAGNOSIS — I4821 Permanent atrial fibrillation: Secondary | ICD-10-CM

## 2022-01-21 DIAGNOSIS — Z7901 Long term (current) use of anticoagulants: Secondary | ICD-10-CM | POA: Diagnosis not present

## 2022-01-21 DIAGNOSIS — Z8673 Personal history of transient ischemic attack (TIA), and cerebral infarction without residual deficits: Secondary | ICD-10-CM | POA: Diagnosis not present

## 2022-01-21 LAB — POCT INR: INR: 1.7 — AB (ref 2.0–3.0)

## 2022-01-21 NOTE — Patient Instructions (Signed)
Description   ?Today take 2 tablets of warfarin then resume taking Warfarin 2 tablets daily EXCEPT for 1 tablet on MONDAYS, WEDNESDAYS & FRIDAYS. Recheck INR in 1 week.   ?Call Coumadin Clinic if you have any upcoming procedures or start any new medications. [facility] [phone] ? ?(Goal 2.5-3.0 per Discharge Summary on 12/08/21) ?  ?  ?

## 2022-01-27 ENCOUNTER — Ambulatory Visit: Payer: 59 | Admitting: Adult Health

## 2022-01-27 ENCOUNTER — Encounter: Payer: Self-pay | Admitting: Adult Health

## 2022-01-27 VITALS — BP 131/74 | HR 86 | Ht 59.0 in | Wt 110.0 lb

## 2022-01-27 DIAGNOSIS — Z8673 Personal history of transient ischemic attack (TIA), and cerebral infarction without residual deficits: Secondary | ICD-10-CM

## 2022-01-27 DIAGNOSIS — Z9189 Other specified personal risk factors, not elsewhere classified: Secondary | ICD-10-CM | POA: Diagnosis not present

## 2022-01-27 DIAGNOSIS — G459 Transient cerebral ischemic attack, unspecified: Secondary | ICD-10-CM | POA: Diagnosis not present

## 2022-01-27 DIAGNOSIS — G44209 Tension-type headache, unspecified, not intractable: Secondary | ICD-10-CM | POA: Diagnosis not present

## 2022-01-27 DIAGNOSIS — G8194 Hemiplegia, unspecified affecting left nondominant side: Secondary | ICD-10-CM | POA: Diagnosis not present

## 2022-01-27 NOTE — Patient Instructions (Addendum)
Continue working with home health PT - once completed, would recommend pursuing additional outpatient therapies ? ?Increase physical activity, routine exercise, ensure good nutrition intake, at least 64 oz of water per day, routine memory exercises, good sleep and manage stress levels ? ?Would consider evaluation for possible underlying sleep apnea (see below) ? ?Continue aspirin 81 mg daily and warfarin daily  and atorvastatin  for secondary stroke prevention ? ?Continue to follow up with PCP regarding cholesterol, diabetes and blood pressure management  ?Maintain strict control of hypertension with blood pressure goal below 130/90, diabetes with hemoglobin A1c goal below 7.0 % and cholesterol with LDL cholesterol (bad cholesterol) goal below 70 mg/dL.  ? ?Signs of a Stroke? Follow the BEFAST method:  ?Balance Watch for a sudden loss of balance, trouble with coordination or vertigo ?Eyes Is there a sudden loss of vision in one or both eyes? Or double vision?  ?Face: Ask the person to smile. Does one side of the face droop or is it numb?  ?Arms: Ask the person to raise both arms. Does one arm drift downward? Is there weakness or numbness of a leg? ?Speech: Ask the person to repeat a simple phrase. Does the speech sound slurred/strange? Is the person confused ? ?Time: If you observe any of these signs, call 911. ? ? ? ? ? ? ? ?Thank you for coming to see Christy Poole at [facility]. I hope we have been able to provide you high quality care today. ? ?You may receive a patient satisfaction survey over the next few weeks. We would appreciate your feedback and comments so that we may continue to improve ourselves and the health of our patients. ? ? ? ? ?Tension Headache, Adult ?A tension headache is a feeling of pain, pressure, or aching in the head. It is often felt over the front and sides of the head. Tension headaches can last from 30 minutes to several days. ?What are the causes? ?The cause of this  condition is not known. Sometimes, tension headaches are brought on by stress, worry (anxiety), or depression. Other things that may set them off include: ?Alcohol. ?Too much caffeine or caffeine withdrawal. ?Colds, flu, or sinus infections. ?Dental problems. This can include clenching your teeth. ?Being tired. ?Holding your head and neck in the same position for a long time, such as while using a computer. ?Smoking. ?Arthritis in the neck. ?What are the signs or symptoms? ?Feeling pressure around the head. ?A dull ache in the head. ?Pain over the front and sides of the head. ?Feeling sore or tender in the muscles of the head, neck, and shoulders. ?How is this treated? ?This condition may be treated with lifestyle changes and with medicines that help relieve symptoms. ?Follow these instructions at home: ?Managing pain ?Take over-the-counter and prescription medicines only as told by your doctor. ?When you have a headache, lie down in a dark, quiet room. ?If told, put ice on your head and neck. To do this: ?Put ice in a plastic bag. ?Place a towel between your skin and the bag. ?Leave the ice on for 20 minutes, 2-3 times a day. ?Take off the ice if your skin turns bright red. This is very important. If you cannot feel pain, heat, or cold, you have a greater risk of damage to the area. ?If told, put heat on the back of your neck. Do this as often as told by your doctor. Use the heat source that your doctor recommends, such as a moist  heat pack or a heating pad. ?Place a towel between your skin and the heat source. ?Leave the heat on for 20-30 minutes. ?Take off the heat if your skin turns bright red. This is very important. If you cannot feel pain, heat, or cold, you have a greater risk of getting burned. ?Eating and drinking ?Eat meals on a regular schedule. ?If you drink alcohol: ?Limit how much you have to: ?0-1 drink a day for women who are not pregnant. ?0-2 drinks a day for men. ?Know how much alcohol is in your  drink. In the [place] one drink equals one 12 oz bottle of beer (355 mL), one 5 oz glass of wine (148 mL), or one 1? oz glass of hard liquor (44 mL). ?Drink enough fluid to keep your pee (urine) pale yellow. ?Do not use a lot of caffeine, or stop using caffeine. ?Lifestyle ?Get 7-9 hours of sleep each night. Or get the amount of sleep that your doctor tells you to. ?At bedtime, keep computers, phones, and tablets out of your room. ?Find ways to lessen your stress. This may include: ?Exercise. ?Deep breathing. ?Yoga. ?Listening to music. ?Thinking positive thoughts. ?Sit up straight. Try to relax your muscles. ?Do not smoke or use any products that contain nicotine or tobacco. If you need help quitting, ask your doctor. ?General instructions ? ?Avoid things that can bring on headaches. Keep a headache journal to see what may bring on headaches. For example, write down: ?What you eat and drink. ?How much sleep you get. ?Any change to your diet or medicines. ?Keep all follow-up visits. ?Contact a doctor if: ?Your headache does not get better. ?Your headache comes back. ?You have a headache, and sounds, light, or smells bother you. ?You feel like you may vomit, or you vomit. ?Your stomach hurts. ?Get help right away if: ?You all of a sudden get a very bad headache with any of these things: ?A stiff neck. ?Feeling like you may vomit. ?Vomiting. ?Feeling mixed up (confused). ?Feeling weak in one part or one side of your body. ?Having trouble seeing or speaking, or both. ?Feeling short of breath. ?A rash. ?Feeling very sleepy. ?Pain in your eye or ear. ?Trouble walking or balancing. ?Feeling like you will faint, or you faint. ?Summary ?A tension headache is pain, pressure, or aching in your head. ?Tension headaches can last from 30 minutes to several days. ?Lifestyle changes and medicines may help relieve pain. ?This information is not intended to replace advice given to you by your health care provider. Make sure you  discuss any questions you have with your health care provider. ?Document Revised: 07/09/2020 Document Reviewed: 07/09/2020 ?Elsevier Patient Education ? 2022 [place]. ? ? ? ? ? ?Stroke Prevention ?Some medical conditions and lifestyle choices can lead to a higher risk for a stroke. You can help to prevent a stroke by eating healthy foods and exercising. It also helps to not smoke and to manage any health problems you may have. ?How can this condition affect me? ?A stroke is an emergency. It should be treated right away. A stroke can lead to brain damage or threaten your life. There is a better chance of surviving and getting better after a stroke if you get medical help right away. ?What can increase my risk? ?The following medical conditions may increase your risk of a stroke: ?Diseases of the heart and blood vessels (cardiovascular disease). ?High blood pressure (hypertension). ?Diabetes. ?High cholesterol. ?Sickle cell disease. ?Problems with blood clotting. ?Being  very overweight. ?Sleeping problems (obstructivesleep apnea). ?Other risk factors include: ?Being older than age 55. ?A history of blood clots, stroke, or mini-stroke (TIA). ?Race, ethnic background, or a family history of stroke. ?Smoking or using tobacco products. ?Taking birth control pills, especially if you smoke. ?Heavy alcohol and drug use. ?Not being active. ?What actions can I take to prevent this? ?Manage your health conditions ?High cholesterol. ?Eat a healthy diet. If this is not enough to manage your cholesterol, you may need to take medicines. ?Take medicines as told by your doctor. ?High blood pressure. ?Try to keep your blood pressure below 130/80. ?If your blood pressure cannot be managed through a healthy diet and regular exercise, you may need to take medicines. ?Take medicines as told by your doctor. ?Ask your doctor if you should check your blood pressure at home. ?Have your blood pressure checked every year. ?Diabetes. ?Eat a  healthy diet and get regular exercise. If your blood sugar (glucose) cannot be managed through diet and exercise, you may need to take medicines. ?Take medicines as told by your doctor. ?Talk to your doctor about

## 2022-01-27 NOTE — Progress Notes (Signed)
?[facility] ?[address] ?[place]. [place] [address] ?[phone] ? ?     HOSPITAL FOLLOW UP NOTE ? ?[name] ?Date of Birth:  May 07, 1961 ?Medical Record Number:  [id number]  ? ?Reason for Referral:  hospital stroke follow up ? ? ? ?SUBJECTIVE: ? ? ?CHIEF COMPLAINT:  ?Chief Complaint  ?Patient presents with  ? Follow-up  ?  RM 3 with son Christy Poole ?Pt is well, son state she is having some impaired vision and forgetfulness.   ? ? ?HPI:  ? ?[name] is a 61 y.o. 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  Noted symptoms wax and wane.  Personally reviewed hospitalization pertinent progress notes, lab work and imaging.  Evaluated by Dr. Erlinda Hong for likely right brain TIA likely due to A-fib on Coumadin with lower and INR level. CTH and MR brain negative for acute stroke, noted chronic moderate size cortical/subcortical right MCA territory infarct.  CTA head/neck unremarkable.  EF 45 to 50%.  LDL 36.  A1c 6.6.  Recommend continuation of warfarin and aspirin with increased INR goal at 2.5-3.0.  Continuation of atorvastatin 80 mg daily.  She was discharged home with recommended home health PT/OT. ? ?Return to ED 2/22 with recurrence of left facial and left arm weakness lasting 5 to 10 minutes then resolved.  Repeat CT head no acute findings.  Diagnosed with TIA.  No further work-up completed as recently completed for similar symptoms. ? ?First passing out, about 25 min after symptoms started ?Similar to second time  ? ? ?Today, 01/27/2022, patient being seen for hospital follow up accompanied by her son who assists with translation and providing majority of history. Overall stable from stroke standpoint.  Denies new or reoccurring stroke/TIA  symptoms.  Continued left-sided weakness and short-term memory difficulties but at baseline. Continues to work with [facility] PT. Ambulates without AD, no recent falls.  Son mentions syncopal episodes prior to her stroke in 2019 and about 25 minutes prior to recent TIA events. No additional syncopal events since that time. Also mentions headaches over the past 6-7 months, present on both sides of head and on top in pressure/band type sensation. Denies photophobia, phonophobia or N/V. Will take tylenol with resolution. Lives with son and daughter-in-law who assists with ADLs and majority of IADLs.  Remains on aspirin and warfarin, denies side effects.  Routinely monitored by Coumadin clinic and has since establish care with cardiologist Dr. Harrell Gave.  Remains on atorvastatin, denies side effects.  Blood pressure today 131/74.  No further concerns at this time.  ? ? ? ? ? ?PERTINENT IMAGING ? ?Per hospitalization 12/08/2021 ?CT no acute abnormality ?MRI no acute infarct, chronic moderate-sized cortical/subcortical right MCA territory infarct ?CT head and neck unremarkable ?2D Echo EF 45 to 50% ?LDL 36 ?HgbA1c 6.6 ? ? ? ?ROS:   ?14 system review of systems performed and negative with exception of those listed in HPI ? ?PMH:  ?Past Medical History:  ?Diagnosis Date  ? Atrial fibrillation ([place])   ? Diabetes mellitus without complication ([place])   ? Stroke [facility])   ? Thyroid disease   ? ? ?PSH: History reviewed. No pertinent surgical history. ? ?Social History:  ?Social History  ? ?Socioeconomic History  ? Marital status: Widowed  ?  Spouse name: Not on file  ? Number of children: Not on file  ? Years of education: Not on file  ? Highest education level: Not on file  ?Occupational History  ? Not on file  ?Tobacco Use  ? Smoking status: Never  ? Smokeless tobacco: Never  ?Vaping Use  ? Vaping Use: Never used  ?Substance and Sexual Activity  ? Alcohol use: Never  ? Drug use: Never  ? Sexual activity: Never  ?Other Topics Concern  ?  Not on file  ?Social History Narrative  ? Not on file  ? ?Social Determinants of Health  ? ?Financial Resource Strain: Not on file  ?Food Insecurity: Not on file  ?Transportation Needs: Not on file  ?Physical Activity: Not on file  ?Stress: Not on file  ?Social Connections: Not on file  ?Intimate Partner Violence: Not on file  ? ? ?Family History: History reviewed. No pertinent family history. ? ?Medications:   ?Current Outpatient Medications on File Prior to Visit  ?Medication Sig Dispense Refill  ? acetaminophen (TYLENOL) 500 MG tablet Take 1,000 mg by mouth every 6 (six) hours as needed for mild pain, headache or fever.    ? aspirin EC 81 MG tablet Take 81 mg by mouth daily. Swallow whole.    ? atorvastatin (LIPITOR) 80 MG tablet Take 80 mg by mouth daily.    ? levothyroxine (SYNTHROID) 50 MCG tablet Take 50 mcg by mouth at bedtime.    ? metoprolol succinate (TOPROL-XL) 50 MG 24 hr tablet Take 1 tablet (50 mg total) by mouth daily. 90 tablet 3  ? warfarin (COUMADIN) 2.5 MG tablet Take 5 mg by mouth at bedtime.    ? ?No current facility-administered medications on file prior to visit.  ? ? ?Allergies:  No Known Allergies ? ? ? ?OBJECTIVE: ? ?Physical Exam ? ?Vitals:  ? 01/27/22 0905  ?BP: 131/74  ?Pulse: 86  ?Weight: 110 lb (49.9 kg)  ?Height: 4\' 11"  (1.499 m)  ? ?Body mass index is 22.22 kg/m?Marland Kitchen ?No results found. ? ?General: well developed, well nourished, pleasant middle aged female, seated, in no evident distress ?Head: head normocephalic and atraumatic.   ?Neck: supple with no carotid or supraclavicular bruits ?Cardiovascular: irregular rate and rhythm, no murmurs ?Musculoskeletal: no deformity ?Skin:  no rash/petichiae ?Vascular:  Normal pulses all extremities ?  ?Neurologic Exam ?Mental Status: Awake and fully alert. limited to no english - per son, mild dysarthria chronic. Denies aphasia. Oriented to place and time. Recent memory mildly impaired and remote memory intact. Son provides majority of hx due to  language barrier. Mood and affect appropriate.  ?Cranial Nerves: Pupils equal, briskly reactive to light. Extraocular movements full without nystagmus. Visual fields unable to test due to difficulty understanding directions, does blink to threat bilaterally. Hearing intact. Facial sensation intact. Face, tongue, palate moves normally and symmetrically.  ?Motor: Normal bulk and tone. Normal strength on right side. mild 4/5 left sided weakness chronic  ?Sensory.: intact to touch , pinprick , position and vibratory sensation.  ?Coordination: Rapid alternating movements normal in all extremities except slightly decreased left hand. Finger-to-nose and heel-to-shin performed accurately bilaterally. ?Gait and Station: Arises from chair without difficulty. Stance is normal. Gait demonstrates normal stride length and with mild imbalance without use of AD. Unable to completetandem walk and heel toe.  ?Reflexes: 1+ and symmetric. Toes downgoing.  ? ? ? ? ?NIHSS  1 (reported dysarthria)  ?Modified Rankin  2  ? ? ? ? ? ?ASSESSMENT: Brantley M  Hineman is a 61 y.o. year old female right brain TIA on 12/08/2021 and 12/15/2021 likely due to A-fib on Coumadin with lower and INR level. Vascular risk factors include history of right MCA stroke with residual left-sided weakness and facial weakness, hx of rheumatic mitral valve stenosis and A-fib on Coumadin, CAD s/p stent 2016, HLD and new dx of DM.  ? ? ? ? ?PLAN: ? ?TIA:  ?Hx of R MCA stroke ?Residual deficit: Left-sided weakness and short term memory loss currently at baseline. Continue working with therapies. Discussed memory exercises at home.  ?Continue aspirin 81 mg daily and warfarin daily  and atorvastatin 80 mg daily for secondary stroke prevention, CAD s/p stent, rheumatic mitral valve stenosis and A-fib.   ?Discussed secondary stroke prevention measures and importance of close PCP follow up for aggressive stroke risk factor management including BP goal<130/90, HLD with LDL  goal<70 and DM with A1c.<7 ?I have gone over the pathophysiology of stroke, warning signs and symptoms, risk factors and their management in some detail with instructions to go to the closest emergency room for sy

## 2022-01-28 ENCOUNTER — Ambulatory Visit (INDEPENDENT_AMBULATORY_CARE_PROVIDER_SITE_OTHER): Payer: 59

## 2022-01-28 DIAGNOSIS — Z8673 Personal history of transient ischemic attack (TIA), and cerebral infarction without residual deficits: Secondary | ICD-10-CM

## 2022-01-28 DIAGNOSIS — Z7901 Long term (current) use of anticoagulants: Secondary | ICD-10-CM | POA: Diagnosis not present

## 2022-01-28 DIAGNOSIS — I4821 Permanent atrial fibrillation: Secondary | ICD-10-CM | POA: Diagnosis not present

## 2022-01-28 LAB — POCT INR: INR: 2.1 (ref 2.0–3.0)

## 2022-01-28 NOTE — Patient Instructions (Signed)
Description   ?Today take 2 tablets of warfarin then resume taking Warfarin 2 tablets daily EXCEPT for 1 tablet on MONDAYS, WEDNESDAYS & FRIDAYS. Recheck INR in 1 week.   ?Call Coumadin Clinic if you have any upcoming procedures or start any new medications. Coumadin Clinic #[phone] ? ?(Goal 2.5-3.0 per Discharge Summary on 12/08/21) ?  ?  ?

## 2022-02-04 ENCOUNTER — Ambulatory Visit (INDEPENDENT_AMBULATORY_CARE_PROVIDER_SITE_OTHER): Payer: 59 | Admitting: *Deleted

## 2022-02-04 DIAGNOSIS — I4821 Permanent atrial fibrillation: Secondary | ICD-10-CM | POA: Diagnosis not present

## 2022-02-04 DIAGNOSIS — Z5181 Encounter for therapeutic drug level monitoring: Secondary | ICD-10-CM | POA: Diagnosis not present

## 2022-02-04 DIAGNOSIS — Z8673 Personal history of transient ischemic attack (TIA), and cerebral infarction without residual deficits: Secondary | ICD-10-CM

## 2022-02-04 DIAGNOSIS — Z7901 Long term (current) use of anticoagulants: Secondary | ICD-10-CM

## 2022-02-04 LAB — POCT INR: INR: 1.9 — AB (ref 2.0–3.0)

## 2022-02-04 NOTE — Patient Instructions (Addendum)
Description   ? Start taking warfarin 2 tablets daily EXCEPT for 1 tablet on Mondays. Recheck INR in 1 week. Stay consistent with 1 serving of greens per week. Coumadin Clinic [phone].  ?(Goal 2.5-3.0 per Discharge Summary on 12/08/21) ?  ?  ?

## 2022-02-10 ENCOUNTER — Ambulatory Visit (INDEPENDENT_AMBULATORY_CARE_PROVIDER_SITE_OTHER): Payer: 59 | Admitting: Family Medicine

## 2022-02-10 ENCOUNTER — Encounter (HOSPITAL_BASED_OUTPATIENT_CLINIC_OR_DEPARTMENT_OTHER): Payer: Self-pay | Admitting: Family Medicine

## 2022-02-10 VITALS — BP 109/60 | HR 62 | Temp 98.7°F | Ht 59.0 in | Wt 107.0 lb

## 2022-02-10 DIAGNOSIS — R911 Solitary pulmonary nodule: Secondary | ICD-10-CM | POA: Diagnosis not present

## 2022-02-10 DIAGNOSIS — Z8673 Personal history of transient ischemic attack (TIA), and cerebral infarction without residual deficits: Secondary | ICD-10-CM

## 2022-02-10 DIAGNOSIS — E119 Type 2 diabetes mellitus without complications: Secondary | ICD-10-CM

## 2022-02-10 DIAGNOSIS — I4821 Permanent atrial fibrillation: Secondary | ICD-10-CM

## 2022-02-10 NOTE — Assessment & Plan Note (Signed)
Noted on prior CT imaging of the head and neck and apical region of lungs with recommended dedicated CT of the chest with contrast ?Order placed today for CT of the chest with contrast in order to further evaluate pulmonary nodule ?

## 2022-02-10 NOTE — Assessment & Plan Note (Signed)
Continues to follow with Coumadin clinic through cardiology.  Still working to adjust warfarin to achieve therapeutic INR ?

## 2022-02-10 NOTE — Progress Notes (Signed)
? ? ?  Procedures performed today:   ? ?None. ? ?Independent interpretation of notes and tests performed by another provider:  ? ?None. ? ?Brief History, Exam, Impression, and Recommendations:   ? ?BP 109/60   Pulse 62   Temp 98.7 ?F (37.1 ?C)   Ht 4\' 11"  (1.499 m)   Wt 107 lb (48.5 kg)   SpO2 99%   BMI 21.61 kg/m?  ? ?Atrial fibrillation ([place]) ?Continues to follow with Coumadin clinic through cardiology.  Still working to adjust warfarin to achieve therapeutic INR ? ?Pulmonary nodule ?Noted on prior CT imaging of the head and neck and apical region of lungs with recommended dedicated CT of the chest with contrast ?Order placed today for CT of the chest with contrast in order to further evaluate pulmonary nodule ? ?Diabetes mellitus ([place]) ?Discussed again with patient and son about diagnosis of diabetes, lifestyle modifications.  Did also discuss possibility of initiating low-dose metformin.  They wish to hold off for now and plan to recheck A1c to monitor status in the next month or 2 and then further decide on any pharmacotherapy ?Urine microalbumin/creatinine ratio completed at last visit and was normal ?Has previously been referred to ophthalmology ? ?History of stroke ?Continues to have some deficits including some weakness, short-term memory issues.  She has been working with therapy, doing home exercises ?Did have follow-up with neurology, was instructed on as needed follow-up with them and to continue with home exercises and therapy ? ?Plan for follow-up in about 6 weeks to monitor above, recheck hemoglobin A1c ? ? ?___________________________________________ ?Avelino Herren de [place], MD, ABFM, CAQSM ?Primary Care and Sports Medicine ?[place] ?

## 2022-02-10 NOTE — Assessment & Plan Note (Signed)
Continues to have some deficits including some weakness, short-term memory issues.  She has been working with therapy, doing home exercises ?Did have follow-up with neurology, was instructed on as needed follow-up with them and to continue with home exercises and therapy ?

## 2022-02-10 NOTE — Patient Instructions (Signed)
?  Medication Instructions:  ?Your physician recommends that you continue on your current medications as directed.   ?Referrals/Procedures/Imaging: ?CT Scan  ? ?Follow-Up: ?Your next appointment:   ?Your physician recommends that you schedule a follow-up appointment in: 6 week with Dr. de [place] ? ?You will receive a text message or e-mail with a link to a survey about your care and experience with [place] today! We would greatly appreciate your feedback!  ? ?Thanks for letting us be apart of your health journey!!  ?Primary Care and Sports Medicine  ? ?Dr. Marcy Salvo de [place]  ? ?We encourage you to activate your patient portal called "MyChart".  Sign up information is provided on this After Visit Summary.  MyChart is used to connect with patients for Virtual Visits (Telemedicine).  Patients are able to view lab/test results, encounter notes, upcoming appointments, etc.  Non-urgent messages can be sent to your provider as well. To learn more about what you can do with MyChart, please visit --  [email or web address].   ? ?

## 2022-02-10 NOTE — Assessment & Plan Note (Signed)
Discussed again with patient and son about diagnosis of diabetes, lifestyle modifications.  Did also discuss possibility of initiating low-dose metformin.  They wish to hold off for now and plan to recheck A1c to monitor status in the next month or 2 and then further decide on any pharmacotherapy ?Urine microalbumin/creatinine ratio completed at last visit and was normal ?Has previously been referred to ophthalmology ?

## 2022-02-16 ENCOUNTER — Ambulatory Visit (INDEPENDENT_AMBULATORY_CARE_PROVIDER_SITE_OTHER): Payer: 59 | Admitting: *Deleted

## 2022-02-16 DIAGNOSIS — Z7901 Long term (current) use of anticoagulants: Secondary | ICD-10-CM | POA: Diagnosis not present

## 2022-02-16 DIAGNOSIS — Z8673 Personal history of transient ischemic attack (TIA), and cerebral infarction without residual deficits: Secondary | ICD-10-CM

## 2022-02-16 DIAGNOSIS — I4821 Permanent atrial fibrillation: Secondary | ICD-10-CM

## 2022-02-16 LAB — POCT INR: INR: 2.7 (ref 2.0–3.0)

## 2022-02-16 NOTE — Patient Instructions (Signed)
Description   ?Continue taking warfarin 2 tablets daily EXCEPT for 1 tablet on Mondays. Recheck INR in 2 weeks. Stay consistent with 1 serving of greens per week. Coumadin Clinic [phone].  ?(Goal 2.5-3.0 per Discharge Summary on 12/08/21) ?  ?  ?

## 2022-03-02 ENCOUNTER — Ambulatory Visit (INDEPENDENT_AMBULATORY_CARE_PROVIDER_SITE_OTHER): Payer: 59

## 2022-03-02 DIAGNOSIS — Z7901 Long term (current) use of anticoagulants: Secondary | ICD-10-CM

## 2022-03-02 DIAGNOSIS — Z8673 Personal history of transient ischemic attack (TIA), and cerebral infarction without residual deficits: Secondary | ICD-10-CM

## 2022-03-02 DIAGNOSIS — I4821 Permanent atrial fibrillation: Secondary | ICD-10-CM | POA: Diagnosis not present

## 2022-03-02 LAB — POCT INR: INR: 2.6 (ref 2.0–3.0)

## 2022-03-02 NOTE — Patient Instructions (Signed)
Description   ?Continue taking warfarin 2 tablets daily EXCEPT for 1 tablet on Mondays. Recheck INR in 3 weeks.  ?Stay consistent with 1 serving of greens per week.  ?Coumadin Clinic [phone].  ?(Goal 2.5-3.0 per Discharge Summary on 12/08/21) ?  ?   ?

## 2022-03-23 ENCOUNTER — Ambulatory Visit (INDEPENDENT_AMBULATORY_CARE_PROVIDER_SITE_OTHER): Payer: 59

## 2022-03-23 DIAGNOSIS — Z8673 Personal history of transient ischemic attack (TIA), and cerebral infarction without residual deficits: Secondary | ICD-10-CM | POA: Diagnosis not present

## 2022-03-23 DIAGNOSIS — Z7901 Long term (current) use of anticoagulants: Secondary | ICD-10-CM | POA: Diagnosis not present

## 2022-03-23 DIAGNOSIS — I4821 Permanent atrial fibrillation: Secondary | ICD-10-CM | POA: Diagnosis not present

## 2022-03-23 LAB — POCT INR: INR: 2.3 (ref 2.0–3.0)

## 2022-03-23 NOTE — Patient Instructions (Signed)
Description   Take 2.5 tablets today and then continue taking warfarin 2 tablets daily EXCEPT for 1 tablet on Mondays.  Recheck INR in 3 weeks.  Stay consistent with 1 serving of greens per week.  Coumadin Clinic [phone].  (Goal 2.5-3.0 per Discharge Summary on 12/08/21)

## 2022-03-30 ENCOUNTER — Ambulatory Visit (INDEPENDENT_AMBULATORY_CARE_PROVIDER_SITE_OTHER): Payer: 59 | Admitting: Family Medicine

## 2022-03-30 ENCOUNTER — Encounter (HOSPITAL_BASED_OUTPATIENT_CLINIC_OR_DEPARTMENT_OTHER): Payer: Self-pay | Admitting: Family Medicine

## 2022-03-30 VITALS — BP 119/71 | HR 73 | Ht 59.0 in | Wt 108.7 lb

## 2022-03-30 DIAGNOSIS — R911 Solitary pulmonary nodule: Secondary | ICD-10-CM

## 2022-03-30 DIAGNOSIS — E119 Type 2 diabetes mellitus without complications: Secondary | ICD-10-CM

## 2022-03-30 LAB — HEMOGLOBIN A1C
Est. average glucose Bld gHb Est-mCnc: 117 mg/dL
Hgb A1c MFr Bld: 5.7 % — ABNORMAL HIGH (ref 4.8–5.6)

## 2022-03-30 NOTE — Patient Instructions (Signed)
  Medication Instructions:  Your physician recommends that you continue on your current medications as directed. Please refer to the Current Medication list given to you today. --If you need a refill on any your medications before your next appointment, please call your pharmacy first. If no refills are authorized on file call the office.-- Lab Work: Your physician has recommended that you have lab work today: Yes If you have labs (blood work) drawn today and your tests are completely normal, you will receive your results via MyChart message OR a phone call from our staff.  Please ensure you check your voicemail in the event that you authorized detailed messages to be left on a delegated number. If you have any lab test that is abnormal or we need to change your treatment, we will call you to review the results.  Referrals/Procedures/Imaging: Imaging we will stay in contact with them.  Follow-Up: Your next appointment:   Your physician recommends that you schedule a follow-up appointment in 6 weeks with Dr. de [place].  You will receive a text message or e-mail with a link to a survey about your care and experience with [place] today! We would greatly appreciate your feedback!   Thanks for letting us be apart of your health journey!!  Primary Care and Sports Medicine   Dr. Ceasar Mons [place]   We encourage you to activate your patient portal called "MyChart".  Sign up information is provided on this After Visit Summary.  MyChart is used to connect with patients for Virtual Visits (Telemedicine).  Patients are able to view lab/test results, encounter notes, upcoming appointments, etc.  Non-urgent messages can be sent to your provider as well. To learn more about what you can do with MyChart, please visit --  [email or web address].

## 2022-03-30 NOTE — Progress Notes (Signed)
    Procedures performed today:    None.  Independent interpretation of notes and tests performed by another provider:   None.  Brief History, Exam, Impression, and Recommendations:    BP 119/71   Pulse 73   Ht 4\' 11"  (1.499 m)   Wt 108 lb 11.2 oz (49.3 kg)   SpO2 100%   BMI 21.95 kg/m   Pulmonary nodule Still waiting to obtain approval and schedule CT of the chest with contrast.  No new symptoms currently. Provided patient and family member with contact for imaging center to be able to follow-up regarding insurance authorization and scheduling of imaging study  Diabetes mellitus (HCC) Lifestyle modifications, no medications at present.  Patient is due for recheck of A1c.  Current issues related to polyuria or polydipsia. We will check A1c today Previously referred to ophthalmology for retinopathy screening  Return in about 6 weeks (around 05/11/2022) for DM.   ___________________________________________ Melah Ebling de 05/13/2022, MD, ABFM, CAQSM Primary Care and Sports Medicine [facility]

## 2022-04-01 NOTE — Assessment & Plan Note (Signed)
Lifestyle modifications, no medications at present.  Patient is due for recheck of A1c.  Current issues related to polyuria or polydipsia. We will check A1c today Previously referred to ophthalmology for retinopathy screening

## 2022-04-01 NOTE — Assessment & Plan Note (Addendum)
Still waiting to obtain approval and schedule CT of the chest with contrast.  No new symptoms currently. Provided patient and family member with contact for imaging center to be able to follow-up regarding insurance authorization and scheduling of imaging study

## 2022-04-05 ENCOUNTER — Ambulatory Visit (HOSPITAL_BASED_OUTPATIENT_CLINIC_OR_DEPARTMENT_OTHER): Payer: 59 | Admitting: Cardiology

## 2022-04-05 ENCOUNTER — Encounter (HOSPITAL_BASED_OUTPATIENT_CLINIC_OR_DEPARTMENT_OTHER): Payer: Self-pay | Admitting: Cardiology

## 2022-04-05 VITALS — BP 147/83 | HR 76 | Ht 59.0 in | Wt 109.0 lb

## 2022-04-05 DIAGNOSIS — Z8673 Personal history of transient ischemic attack (TIA), and cerebral infarction without residual deficits: Secondary | ICD-10-CM

## 2022-04-05 DIAGNOSIS — I050 Rheumatic mitral stenosis: Secondary | ICD-10-CM

## 2022-04-05 DIAGNOSIS — I4821 Permanent atrial fibrillation: Secondary | ICD-10-CM

## 2022-04-05 DIAGNOSIS — R0989 Other specified symptoms and signs involving the circulatory and respiratory systems: Secondary | ICD-10-CM

## 2022-04-05 DIAGNOSIS — I208 Other forms of angina pectoris: Secondary | ICD-10-CM

## 2022-04-05 DIAGNOSIS — Z7901 Long term (current) use of anticoagulants: Secondary | ICD-10-CM

## 2022-04-05 MED ORDER — METOPROLOL SUCCINATE ER 50 MG PO TB24: 75.0000 mg | ORAL_TABLET | Freq: Every day | ORAL | 3 refills | Status: DC

## 2022-04-05 NOTE — Patient Instructions (Signed)
Medication Instructions:  Your physician has recommended you make the following change in your medication:   Change: Metoprolol Succinate 75mg  ( 1.5 tablets) daily   *If you need a refill on your cardiac medications before your next appointment, please call your pharmacy*   Lab Work: None ordered today   Testing/Procedures: Your physician has requested that you have an echocardiogram in 2 months. Echocardiography is a painless test that uses sound waves to create images of your heart. It provides your doctor with information about the size and shape of your heart and how well your heart's chambers and valves are working. This procedure takes approximately one hour. There are no restrictions for this procedure. [place], you and your health needs are our priority.  As part of our continuing mission to provide you with exceptional heart care, we have created designated Provider Care Teams.  These Care Teams include your primary Cardiologist (physician) and Advanced Practice Providers (APPs -  Physician Assistants and Nurse Practitioners) who all work together to provide you with the care you need, when you need it.  We recommend signing up for the patient portal called "MyChart".  Sign up information is provided on this After Visit Summary.  MyChart is used to connect with patients for Virtual Visits (Telemedicine).  Patients are able to view lab/test results, encounter notes, upcoming appointments, etc.  Non-urgent messages can be sent to your provider as well.   To learn more about what you can do with MyChart, go to [email or web address].    Your next appointment:   Please follow up in Early September after your echocardiogram.   Other Instructions We are reaching out to social work to see if we can assist with your insurance needs!   Important Information About Sugar

## 2022-04-05 NOTE — Progress Notes (Signed)
Cardiology Office Note:    Date:  04/05/2022   ID:  [name], DOB 23-Aug-1961, MRN [id number]  PCP:  de Poole, Christy J, MD  Cardiologist:  Christy Dresser, MD  Referring MD: de Poole, Christy Reveal, MD   CC: follow up  History of Present Illness:    Christy Poole is a 61 y.o. 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 Initially seen by Dr. Sallyanne Poole in the hospital 11/2021.  Cardiac history: Rheumatic mitral stenosis, s/p balloon valvuloplasty in [place] in ~2016/2017. Reported prior cath without significant CAD. Followed in [place] and then [facility] in [place]. History of TIA/CVA as well, initial 2019 and most recent 11/2021. Echo 11/2021 with moderate MS, Wilkins score 7-8. Permanent atrial fibrillation. On coumadin.  CV risk factors: She is vegetarian and eats primarily [place] food. She does not exercise regularly. Father had MI, brother had bypass surgery.  Today: She is accompanied by her son who acts as her historian and translator  Her son reports that she recently fell out of bed and hit her head. There was mild bruising which has since resolved. No loss of consciousness. Reviewed recommendations for head CT with falls as she is on coumadin.   Her son reports she has been experiencing chest discomfort and shortness of breath, primarily with exertion. This is similar to prior. During activity, her discomfort lasts for around 15 minutes and improves after she rests.    At home, her son says her blood pressure is normally around 140/80.  No low heart rates at home on 50 mg metoprolol. Discussed goals for managing mitral stenosis, when we may need to consider evaluation for repeat valvuloplasty vs. Mitral valve replacement.  Denies shortness of breath at rest. No PND, orthopnea, LE edema or unexpected weight gain. No syncope or palpitations.   Past  Medical History:  Diagnosis Date   Atrial fibrillation ([place])    Diabetes mellitus without complication ([place])    Stroke [facility])    Thyroid disease     History reviewed. No pertinent surgical history.  Current Medications: Current Outpatient Medications on File Prior to Visit  Medication Sig   acetaminophen (TYLENOL) 500 MG tablet Take 1,000 mg by mouth every 6 (six) hours as needed for mild pain, headache or fever.   aspirin EC 81 MG tablet Take 81 mg by mouth daily. Swallow whole.   atorvastatin (LIPITOR) 80 MG tablet Take 80 mg by mouth daily.   levothyroxine (SYNTHROID) 50 MCG tablet Take 50 mcg by mouth at bedtime.   warfarin (COUMADIN) 2.5 MG tablet Take 5 mg by mouth at bedtime.   No current facility-administered medications on file prior to visit.     Allergies:   Patient has no known allergies.   Social History   Tobacco Use   Smoking status: Never   Smokeless tobacco: Never  Vaping Use   Vaping Use: Never used  Substance Use Topics   Alcohol use: Never   Drug use: Never    Family History: Father had MI, brother had bypass surgery.  ROS:   Please see the history of present illness.  Additional pertinent ROS otherwise unremarkable.  EKGs/Labs/Other Studies Reviewed:    The following studies were reviewed today:  Echo 12/09/21 1. There is severe, calcific mitral stenosis with average mean gradient  44mmHg at HR 67. MVA by VTI 1.0cm2.   2. Left ventricular  ejection fraction, by estimation, is 45 to 50%. The  left ventricle has mildly decreased function. Left ventricular endocardial  border not optimally defined to evaluate regional wall motion. Diastolic  function is indeterminant due to  Afib.   3. Right ventricular systolic function is normal. The right ventricular  size is normal.   4. Left atrial size was severely dilated.   5. Right atrial size was moderately dilated.   6. The mitral valve is abnormal. Mild mitral valve regurgitation. Severe  mitral  stenosis. The mean mitral valve gradient is 8.5 mmHg. Severe mitral  annular calcification.   7. The aortic valve is tricuspid. There is mild calcification of the  aortic valve. There is mild thickening of the aortic valve. Aortic valve  regurgitation is not visualized. Aortic valve sclerosis/calcification is  present, without any evidence of  aortic stenosis.   8. The inferior vena cava is normal in size with greater than 50%  respiratory variability, suggesting right atrial pressure of 3 mmHg.   Comparison(s): No prior Echocardiogram.   EKG:  EKG is personally reviewed.   01/03/22: atrial fibrillation at 82 bpm  Recent Labs: 12/01/2021: TSH 2.620 12/15/2021: ALT 40; BUN 12; Creatinine, Ser 0.80; Hemoglobin 14.1; Platelets 169; Potassium 4.4; Sodium 137  Recent Lipid Panel    Component Value Date/Time   CHOL 104 12/01/2021 0920   TRIG 84 12/01/2021 0920   HDL 51 12/01/2021 0920   CHOLHDL 2.0 12/01/2021 0920   LDLCALC 36 12/01/2021 0920    Physical Exam:    VS:  BP (!) 147/83 (BP Location: Right Arm, Patient Position: Sitting, Cuff Size: Normal)   Pulse 76   Ht 4\' 11"  (1.499 m)   Wt 109 lb (49.4 kg)   SpO2 97%   BMI 22.02 kg/m     Wt Readings from Last 3 Encounters:  04/05/22 109 lb (49.4 kg)  03/30/22 108 lb 11.2 oz (49.3 kg)  02/10/22 107 lb (48.5 kg)    GEN: Well nourished, well developed in no acute distress HEENT: Normal, moist mucous membranes NECK: No JVD CARDIAC: irregularly irregular rhythm, normal S1 and S2, no rubs or gallops. 1/4 diastolic rumble VASCULAR: Radial and DP pulses 2+ bilaterally. No carotid bruits RESPIRATORY:  Bilateral velcro crackles throughout, most prominently anterior, without wheezing or rhonchi ABDOMEN: Soft, non-tender, non-distended MUSCULOSKELETAL:  Ambulates independently SKIN: Warm and dry, no edema NEUROLOGIC:  Alert and oriented x 3. No focal neuro deficits noted. PSYCHIATRIC:  Normal affect    ASSESSMENT:    1. History of  stroke   2. Permanent atrial fibrillation ([place])   3. Rheumatic mitral stenosis   4. Long term (current) use of anticoagulants   5. Abnormal lung sounds   6. Stable angina pectoris (HCC)     PLAN:    Rheumatic mitral stenosis -agree with Dr. Sallyanne Poole that echo 11/2021 likely moderate mitral stenosis. S/p prior balloon valvuloplasty, Wilkins score 7-8 based on most recent images -increasing metoprolol again today to see if improving heart rate lessens symptoms. 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 Read suggests emphysematous changes and interstitial thickening -no dedicated CT chest available to me, but visualized portions in the CT head and neck note patchy ground glass opacities. Also noted probable lymphadenopathy in the chest, recommended for nonemergent CT chest with contrast -they have seen Dr. De Poole, working to get CT covered by insurance plan. Son asked for social work referral today to see if they can get better medical coverage given her conditions  Cardiac risk counseling and prevention recommendations: -recommend heart healthy/Mediterranean diet, with whole grains, fruits, vegetable, fish, lean meats, nuts, and olive oil. Limit salt. -recommend moderate walking, 3-5 times/week for 30-50 minutes each session. Aim for at least 150 minutes.week. Goal should be  pace of 3 miles/hours, or walking 1.5 miles in 30 minutes -recommend avoidance of tobacco products. Avoid excess alcohol. -ASCVD risk score: The ASCVD Risk score (Arnett DK, et al., 2019) failed to calculate for the following reasons:   The valid total cholesterol range is 130 to 320 mg/dL    Plan for follow up: 3 months or sooner as needed  Christy Dresser, MD, PhD, [facility]    Medication Adjustments/Labs and Tests Ordered: Current medicines are reviewed at length with the patient today.  Concerns regarding medicines are outlined above.  Orders Placed This Encounter  Procedures   ECHOCARDIOGRAM COMPLETE   Meds ordered this encounter  Medications   metoprolol succinate (TOPROL-XL) 50 MG 24 hr tablet    Sig: Take 1.5 tablets (75 mg total) by mouth daily.    Dispense:  135 tablet    Refill:  3    Patient Instructions  Medication Instructions:  Your physician has recommended you make the following change in your medication:   Change: Metoprolol Succinate 75mg  ( 1.5 tablets) daily   *If you need a refill on your cardiac medications before your next appointment, please call your pharmacy*   Lab Work: None ordered today   Testing/Procedures: Your physician has requested that you have an echocardiogram in 2 months. Echocardiography is a painless test that uses sound waves to create images of your heart. It provides your doctor with information about the size and shape of your heart and how well your heart's chambers and valves are working. This procedure takes approximately one hour. There are no restrictions for this procedure. [place], you and your health needs are our priority.  As part of our continuing mission to provide you with exceptional heart care, we have created designated Provider Care Teams.  These Care Teams include your primary Cardiologist (physician) and Advanced Practice  Providers (APPs -  Physician Assistants and Nurse Practitioners) who all work together to provide you with the care you need, when you need it.  We recommend signing up for the patient portal called "MyChart".  Sign up information is provided on this After Visit Summary.  MyChart is used to connect with patients for Virtual Visits (Telemedicine).  Patients are able to view lab/test results, encounter notes, upcoming appointments, etc.  Non-urgent messages can be sent to your provider as well.   To learn more about what you can do with MyChart, go to [email or web address].    Your next appointment:   Please follow up in Early September after your echocardiogram.   Other Instructions We are reaching out  to social work to see if we can assist with your insurance needs!   Important Information About Sugar         Signed, Christy Dresser, MD PhD 04/05/2022 10:38 AM    [place]

## 2022-04-06 ENCOUNTER — Telehealth: Payer: Self-pay | Admitting: Licensed Clinical Social Worker

## 2022-04-06 NOTE — Progress Notes (Signed)
Heart and Vascular Care Navigation  04/06/2022  Christy Poole [birth year] [id number]  Reason for Referral:  Patient is participating in a Managed Medicaid Plan: No Insurance challenges Engaged with patient by telephone for initial visit for Heart and Vascular Care Coordination.                                                                                                   Assessment:          Pt son called me back, he speaks [place] (DPR on file). I introduced self, role, reason for call. Pt confirmed home address, PCP, and that he is pt only emergency contact. Pt moved down from [place] and lives with he and his family. She has had persistent and ongoing medical challenges, visited hospital a few times. They enrolled her in [employer] Plan through Marketplace but are having challenges with receiving bills that they cannot pay. He states pt also needs additional scans and he is being told that they do not take [employer]. Pt son feels pressed bc pt has no current income and he has been told she does not have the appropriate work credits for [employer] and that she is currently ineligible for full Medicaid (was offered [employer]). Unclear if SSI application is currently still pending.   I first inquired if pt having any difficulty getting her medications- denies any concerns, aware of goodrx but pt medications have been covered by [employer]. Primarily he is concerned with the bills received and ongoing needs pt has. I shared that unfortunately b/c pt has insurance she would have to have $5000 or more in outstanding bills to qualify for assistance through [facility]. I did share that pt son and pt should contact [employer] regarding recommended scans and ensure no prior auth needed/if not in network where would be (appears pt son given this information during PCP appt).   Pt son aware, will try and speak with insurance company about challenges with getting pt in for further  assessment. I will speak with my team regarding any addiitonal options pt may have. Unfortunately once enrolled in Marketplace insurance pt cannot elect a different plan until November enrollment reopens or a qualifying life even occurs.                              HRT/VAS Care Coordination     Patients Home Cardiology Office [facility]   Outpatient Care Team Social Worker   Social Worker Name: Nile Riggs, [place] [facility] [phone]   Living arrangements for the past 2 months Single Family Home   Lives with: Adult Children; Relatives   Patient Current Insurance Scientist, water quality   Patient Has Concern With Paying Medical Bills Yes   Patient Concerns With Medical Bills ongoing medical work up, challenges w/ insurance coverage   Medical Bill Referrals: not eligible for CAFA, will discuss with colleagues   Does Patient Have Prescription Coverage? Yes   DME Agency --  Chiropractor   [facility] Agency [facility]  Care       Social History:                                                                             SDOH Screenings   Alcohol Screen: Not on file  Depression (PHQ2-9): Low Risk  (03/30/2022)   Depression (PHQ2-9)    PHQ-2 Score: 0  Financial Resource Strain: High Risk (04/06/2022)   Overall Financial Resource Strain (CARDIA)    Difficulty of Paying Living Expenses: Very hard  Food Insecurity: Not on file  Housing: Low Risk  (04/06/2022)   Housing    Last Housing Risk Score: 0  Physical Activity: Not on file  Social Connections: Not on file  Stress: Not on file  Tobacco Use: Low Risk  (04/05/2022)   Patient History    Smoking Tobacco Use: Never    Smokeless Tobacco Use: Never    Passive Exposure: Not on file  Transportation Needs: No Transportation Needs (04/06/2022)   PRAPARE - Transportation    Lack of Transportation (Medical): No    Lack of Transportation (Non-Medical): No    SDOH Interventions: Financial Resources:  Financial Strain  Interventions: Other (Comment) (ineligible for CAFA will discuss alternate options if any with team)   Food Insecurity:   Not addressed during this call  Housing Insecurity:  Housing Interventions: Intervention Not Indicated  Transportation:   Transportation Interventions: Intervention Not Indicated    Other Care Navigation Interventions:     Provided Pharmacy assistance resources  Pt son states her medications are currently covered by her insurance, no expressed concerns regarding affordability or accessibility.    Follow-up plan:   I will speak with my team regarding any options, unfortunately they currently remain limited due to pt insurance and the fact she is under the $5000 threshold for charity. I will speak with my team and shared I would call pt son back Friday.

## 2022-04-06 NOTE — Telephone Encounter (Signed)
Attempted to reach pt son [name] at [phone]. Pt son answered, then proceeded to ask me to wait, put phone down and after 5 mins of holding (could hear pt son in background having conversation) I asked twice if he needed me to call back. No answer, I hung up. Will reattempt as able. Pt challenges are with her insurance coverage, she is enrolled in marketplace plan- if there are specific questions related to coverage then pt should contact Member Service for [employer].  Unfortunately she does not qualify for [facility] assistance, [employer], or [employer] but may benefit from [place] card to assist with medication expenses.   Octavio Graves, MSW, LCSW Clinical Social Worker II [facility] Navigation  [phone]- work cell phone (preferred) [phone]- desk phone

## 2022-04-08 ENCOUNTER — Telehealth: Payer: Self-pay | Admitting: Licensed Clinical Social Worker

## 2022-04-08 NOTE — Telephone Encounter (Signed)
H&V Care Navigation CSW Progress Note  Clinical Social Worker contacted caregiver by phone to f/u on resources.  Patient is participating in a Managed Medicaid Plan: No, commercial insurance.   1:23pm- LCSW called pt son [name] at [phone] (DPR on file), to f/u on discussion previously had this week. I shared that unfortunately pt account does not meet threshold to complete financial assistance application (must be > $5000 in past due bills). After discussing case with my colleagues we agree that pt son should reach out to [employer] to determine if any applications (SSI specifically) are still pending. Should pt still be pending determination for SSI she may be eligible for Medicaid if approved. Pt son should discuss any pending applications with billing department to see if pt is eligible for "do not bill" status or payment plan to try and prevent bills from going to collections. Pt son will work on this and I will f/u as able to answer any additional information arises.   4:30pm- Pt son called me this afternoon to let me know that he was able to contact Social Security and SSI is still pending determination. He will call billing Monday to discuss DNB/payment plan at that time. I will f/u next week and pt son will contact me with any additional updates.    SDOH Screenings   Alcohol Screen: Not on file  Depression (PHQ2-9): Low Risk  (03/30/2022)   Depression (PHQ2-9)    PHQ-2 Score: 0  Financial Resource Strain: High Risk (04/06/2022)   Overall Financial Resource Strain (CARDIA)    Difficulty of Paying Living Expenses: Very hard  Food Insecurity: Not on file  Housing: Low Risk  (04/06/2022)   Housing    Last Housing Risk Score: 0  Physical Activity: Not on file  Social Connections: Not on file  Stress: Not on file  Tobacco Use: Low Risk  (04/05/2022)   Patient History    Smoking Tobacco Use: Never    Smokeless Tobacco Use: Never    Passive Exposure: Not on file   Transportation Needs: No Transportation Needs (04/06/2022)   PRAPARE - Transportation    Lack of Transportation (Medical): No    Lack of Transportation (Non-Medical): No    Octavio Graves, MSW, LCSW Clinical Social Worker II [facility] Heart/Vascular Care Navigation  [phone]- work cell phone (preferred) [phone]- desk phone

## 2022-04-11 DIAGNOSIS — Z0271 Encounter for disability determination: Secondary | ICD-10-CM

## 2022-04-12 ENCOUNTER — Ambulatory Visit (INDEPENDENT_AMBULATORY_CARE_PROVIDER_SITE_OTHER): Payer: 59

## 2022-04-12 DIAGNOSIS — I4821 Permanent atrial fibrillation: Secondary | ICD-10-CM | POA: Diagnosis not present

## 2022-04-12 DIAGNOSIS — Z5181 Encounter for therapeutic drug level monitoring: Secondary | ICD-10-CM

## 2022-04-12 DIAGNOSIS — Z8673 Personal history of transient ischemic attack (TIA), and cerebral infarction without residual deficits: Secondary | ICD-10-CM

## 2022-04-12 DIAGNOSIS — Z7901 Long term (current) use of anticoagulants: Secondary | ICD-10-CM

## 2022-04-12 LAB — POCT INR: INR: 2.9 (ref 2.0–3.0)

## 2022-04-12 NOTE — Patient Instructions (Signed)
continue taking warfarin 2 tablets daily EXCEPT for 1 tablet on Mondays.  Recheck INR in 6 weeks.  Stay consistent with 1 serving of greens per week.  Coumadin Clinic [phone].  (Goal 2.5-3.0 per Discharge Summary on 12/08/21)

## 2022-04-27 ENCOUNTER — Other Ambulatory Visit (HOSPITAL_BASED_OUTPATIENT_CLINIC_OR_DEPARTMENT_OTHER): Payer: Self-pay

## 2022-04-27 DIAGNOSIS — R911 Solitary pulmonary nodule: Secondary | ICD-10-CM

## 2022-05-06 ENCOUNTER — Ambulatory Visit (HOSPITAL_BASED_OUTPATIENT_CLINIC_OR_DEPARTMENT_OTHER): Payer: 59

## 2022-05-10 ENCOUNTER — Telehealth: Payer: Self-pay | Admitting: Licensed Clinical Social Worker

## 2022-05-10 NOTE — Telephone Encounter (Signed)
H&V Care Navigation CSW Progress Note  Clinical Social Worker contacted patient by phone to f/u on application pending with social security. Pt son reached at [phone], re-introduced self, role, reason for call. Pt son shares that he is still unclear why pt cannot get CT scan, they are still waiting on determination from [employer] and pt has an upcoming appt w/ DDS at [employer] in [place] and [place] (8/8 and 7/24). Pt son shares that he is still committed to making sure pt has what she needs but struggles to make everything come together for them on his limited salary.   LCSW shares I will f/u on CT scan PA and be back in contact with pt son after her appts with DDS. Pt son agreeable, aware he can contact me before that time as needed.   I contacted Caprice Beaver with pt PCP office who shares that prior auth paperwork has been sent and pt son should be able to schedule CT at this time. I requested that they let pt/pt son know during her appt on 7/19.   Patient is participating in a Managed Medicaid Plan:  No, commercial insurance   SDOH Screenings   Alcohol Screen: Not on file  Depression (PHQ2-9): Low Risk  (03/30/2022)   Depression (PHQ2-9)    PHQ-2 Score: 0  Financial Resource Strain: High Risk (04/06/2022)   Overall Financial Resource Strain (CARDIA)    Difficulty of Paying Living Expenses: Very hard  Food Insecurity: Not on file  Housing: Low Risk  (04/06/2022)   Housing    Last Housing Risk Score: 0  Physical Activity: Not on file  Social Connections: Not on file  Stress: Not on file  Tobacco Use: Low Risk  (04/05/2022)   Patient History    Smoking Tobacco Use: Never    Smokeless Tobacco Use: Never    Passive Exposure: Not on file  Transportation Needs: No Transportation Needs (04/06/2022)   PRAPARE - Transportation    Lack of Transportation (Medical): No    Lack of Transportation (Non-Medical): No    Octavio Graves, MSW, LCSW Clinical Social Worker II [facility]  Heart/Vascular Care Navigation  [phone]- work cell phone (preferred) [phone]- desk phone

## 2022-05-11 ENCOUNTER — Encounter (HOSPITAL_BASED_OUTPATIENT_CLINIC_OR_DEPARTMENT_OTHER): Payer: Self-pay | Admitting: Family Medicine

## 2022-05-11 ENCOUNTER — Ambulatory Visit (INDEPENDENT_AMBULATORY_CARE_PROVIDER_SITE_OTHER): Payer: 59 | Admitting: Family Medicine

## 2022-05-11 VITALS — BP 142/82 | HR 72 | Ht 59.0 in | Wt 109.4 lb

## 2022-05-11 DIAGNOSIS — R911 Solitary pulmonary nodule: Secondary | ICD-10-CM

## 2022-05-11 DIAGNOSIS — I050 Rheumatic mitral stenosis: Secondary | ICD-10-CM | POA: Diagnosis not present

## 2022-05-11 DIAGNOSIS — E119 Type 2 diabetes mellitus without complications: Secondary | ICD-10-CM

## 2022-05-11 NOTE — Assessment & Plan Note (Signed)
Order for CT scan previously placed, discussed with referral specialist in office today and have received insurance approval.  Discussed with patient and son in office today and they will reach out to imaging center to schedule to have imaging completed

## 2022-05-11 NOTE — Progress Notes (Signed)
    Procedures performed today:    None.  Independent interpretation of notes and tests performed by another provider:   None.  Brief History, Exam, Impression, and Recommendations:    BP (!) 142/82   Pulse 72   Ht 4\' 11"  (1.499 m)   Wt 109 lb 6.4 oz (49.6 kg)   SpO2 99%   BMI 22.10 kg/m   Patient is brought in today by her son who assists with translation and acts as historian  Diabetes mellitus (HCC) Most recent hemoglobin A1c was at goal at 5.7%.  Not currently utilizing any pharmacotherapy, continues with lifestyle modifications Given prior A1c, do not strongly feel that pharmacotherapy is needed at this time, but would plan to closely monitor A1c moving forward to determine if pharmacotherapy does need to be initiated Has previously completed urine microalbumin/creatinine ratio and this was found to be normal in March 2023 Has also previously been referred to ophthalmology for retinopathy screening  Rheumatic mitral stenosis Continues to have exertional dyspnea, no significant dyspnea at rest.  She does follow with cardiology, plan is for echocardiogram next month and follow-up with cardiology shortly thereafter to review.  Generally symptoms have remained stable over the last few months, no recent worsening Recommend continue with planned evaluation through cardiology, discussed that if symptoms are progressing, to reach out to cardiology as I suspect symptoms are most likely related to cardiac etiology as opposed to pulmonary.  She does have upcoming CT scan of chest to follow-up on prior abnormal imaging  Pulmonary nodule Order for CT scan previously placed, discussed with referral specialist in office today and have received insurance approval.  Discussed with patient and son in office today and they will reach out to imaging center to schedule to have imaging completed  Return in about 3 months (around 08/11/2022) for DM,  SOB.   ___________________________________________ Thomasena Vandenheuvel de 08/13/2022, MD, ABFM, CAQSM Primary Care and Sports Medicine [facility]  Outpatient Encounter Medications as of 05/11/2022  Medication Sig   acetaminophen (TYLENOL) 500 MG tablet Take 1,000 mg by mouth every 6 (six) hours as needed for mild pain, headache or fever.   aspirin EC 81 MG tablet Take 81 mg by mouth daily. Swallow whole.   atorvastatin (LIPITOR) 80 MG tablet Take 80 mg by mouth daily.   levothyroxine (SYNTHROID) 50 MCG tablet Take 50 mcg by mouth at bedtime.   metoprolol succinate (TOPROL-XL) 50 MG 24 hr tablet Take 1.5 tablets (75 mg total) by mouth daily.   warfarin (COUMADIN) 2.5 MG tablet Take 5 mg by mouth at bedtime.   No facility-administered encounter medications on file as of 05/11/2022.

## 2022-05-11 NOTE — Patient Instructions (Signed)
  Medication Instructions:  Your physician recommends that you continue on your current medications as directed. Please refer to the Current Medication list given to you today. --If you need a refill on any your medications before your next appointment, please call your pharmacy first. If no refills are authorized on file call the office.-- Lab Work: Your physician has recommended that you have lab work today: No If you have labs (blood work) drawn today and your tests are completely normal, you will receive your results via MyChart message OR a phone call from our staff.  Please ensure you check your voicemail in the event that you authorized detailed messages to be left on a delegated number. If you have any lab test that is abnormal or we need to change your treatment, we will call you to review the results.  Referrals/Procedures/Imaging: No  Follow-Up: Your next appointment:   Your physician recommends that you schedule a follow-up appointment in: 3 months with Dr. de Cuba.  You will receive a text message or e-mail with a link to a survey about your care and experience with us today! We would greatly appreciate your feedback!   Thanks for letting us be apart of your health journey!!  Primary Care and Sports Medicine   Dr. Raymond de Cuba   We encourage you to activate your patient portal called "MyChart".  Sign up information is provided on this After Visit Summary.  MyChart is used to connect with patients for Virtual Visits (Telemedicine).  Patients are able to view lab/test results, encounter notes, upcoming appointments, etc.  Non-urgent messages can be sent to your provider as well. To learn more about what you can do with MyChart, please visit --  [email or web address].    

## 2022-05-11 NOTE — Assessment & Plan Note (Addendum)
Continues to have exertional dyspnea, no significant dyspnea at rest.  She does follow with cardiology, plan is for echocardiogram next month and follow-up with cardiology shortly thereafter to review.  Generally symptoms have remained stable over the last few months, no recent worsening Recommend continue with planned evaluation through cardiology, discussed that if symptoms are progressing, to reach out to cardiology as I suspect symptoms are most likely related to cardiac etiology as opposed to pulmonary.  She does have upcoming CT scan of chest to follow-up on prior abnormal imaging

## 2022-05-11 NOTE — Assessment & Plan Note (Signed)
Most recent hemoglobin A1c was at goal at 5.7%.  Not currently utilizing any pharmacotherapy, continues with lifestyle modifications Given prior A1c, do not strongly feel that pharmacotherapy is needed at this time, but would plan to closely monitor A1c moving forward to determine if pharmacotherapy does need to be initiated Has previously completed urine microalbumin/creatinine ratio and this was found to be normal in March 2023 Has also previously been referred to ophthalmology for retinopathy screening

## 2022-05-24 ENCOUNTER — Ambulatory Visit (INDEPENDENT_AMBULATORY_CARE_PROVIDER_SITE_OTHER): Payer: 59

## 2022-05-24 DIAGNOSIS — I4821 Permanent atrial fibrillation: Secondary | ICD-10-CM | POA: Diagnosis not present

## 2022-05-24 DIAGNOSIS — Z5181 Encounter for therapeutic drug level monitoring: Secondary | ICD-10-CM | POA: Diagnosis not present

## 2022-05-24 DIAGNOSIS — Z7901 Long term (current) use of anticoagulants: Secondary | ICD-10-CM

## 2022-05-24 DIAGNOSIS — Z8673 Personal history of transient ischemic attack (TIA), and cerebral infarction without residual deficits: Secondary | ICD-10-CM | POA: Diagnosis not present

## 2022-05-24 LAB — POCT INR: INR: 3.5 — AB (ref 2.0–3.0)

## 2022-05-24 NOTE — Patient Instructions (Signed)
TAKE 1 TABLET TOMORROW ONLY and then continue taking warfarin 2 tablets daily EXCEPT for 1 tablet on Mondays.  Recheck INR in 4 weeks.  Stay consistent with 1 serving of greens per week.  Coumadin Clinic [phone].  (Goal 2.5-3.0 per Discharge Summary on 12/08/21)

## 2022-06-02 ENCOUNTER — Telehealth: Payer: Self-pay | Admitting: Licensed Clinical Social Worker

## 2022-06-02 NOTE — Telephone Encounter (Signed)
H&V Care Navigation CSW Progress Note  Clinical Social Worker contacted caregiver by phone to f/u on social security physicals. Pt son shares that pt was able to make it to those assessments and DDS decision is still pending. Pt son shares that [facility] is being closed and he isn't sure of next steps regarding pt coverage. Pt son also shares ongoing financial stress related to being caregiver and only income in the household which includes patient, patient son, his spouse and children. LCSW has sent pt son the following: number for [employer] Line at [employer] as he hasn't ever signed up for [facility] for his family just his mother. I also sent the [facility] for assistance with enrolling in new plan/options for pt, the [employer] and am reviewing case with [facility] leadership for possible patient care fund assistance. Will f/u with pt son to ensure all received.   Patient is participating in a Managed Medicaid Plan:  No, commercial plan only.   SDOH Screenings   Alcohol Screen: Not on file  Depression (PHQ2-9): Low Risk  (05/11/2022)   Depression (PHQ2-9)    PHQ-2 Score: 0  Financial Resource Strain: High Risk (04/06/2022)   Overall Financial Resource Strain (CARDIA)    Difficulty of Paying Living Expenses: Very hard  Food Insecurity: Food Insecurity Present (06/02/2022)   Hunger Vital Sign    Worried About Running Out of Food in the Last Year: Sometimes true    Ran Out of Food in the Last Year: Sometimes true  Housing: Low Risk  (06/02/2022)   Housing    Last Housing Risk Score: 0  Physical Activity: Not on file  Social Connections: Not on file  Stress: Not on file  Tobacco Use: Low Risk  (05/11/2022)   Patient History    Smoking Tobacco Use: Never    Smokeless Tobacco Use: Never    Passive Exposure: Not on file  Transportation Needs: No Transportation Needs (06/02/2022)   PRAPARE - Transportation    Lack of Transportation (Medical): No     Lack of Transportation (Non-Medical): No    Octavio Graves, MSW, LCSW Clinical Social Worker II [facility] Heart/Vascular Care Navigation  [phone]- work cell phone (preferred) [phone]- desk phone

## 2022-06-03 ENCOUNTER — Telehealth: Payer: Self-pay | Admitting: Licensed Clinical Social Worker

## 2022-06-03 NOTE — Telephone Encounter (Signed)
H&V Care Navigation CSW Progress Note  Clinical Social Worker contacted caregiver by phone to f/u on resources texted and additional resources mailed. Pt does not have any bills currently in their name therefore they will need to speak with community agencies about assistance with costs of living. No answer, left voicemail. Pt son called me back, left voicemail with no speech on it and did not answer when I called again. I will re-attempt as able.   Patient is participating in a Managed Medicaid Plan:  No, commercial insurance only  SDOH Screenings   Alcohol Screen: Not on file  Depression (PHQ2-9): Low Risk  (05/11/2022)   Depression (PHQ2-9)    PHQ-2 Score: 0  Financial Resource Strain: High Risk (04/06/2022)   Overall Financial Resource Strain (CARDIA)    Difficulty of Paying Living Expenses: Very hard  Food Insecurity: Food Insecurity Present (06/02/2022)   Hunger Vital Sign    Worried About Running Out of Food in the Last Year: Sometimes true    Ran Out of Food in the Last Year: Sometimes true  Housing: Low Risk  (06/02/2022)   Housing    Last Housing Risk Score: 0  Physical Activity: Not on file  Social Connections: Not on file  Stress: Not on file  Tobacco Use: Low Risk  (05/11/2022)   Patient History    Smoking Tobacco Use: Never    Smokeless Tobacco Use: Never    Passive Exposure: Not on file  Transportation Needs: No Transportation Needs (06/02/2022)   PRAPARE - Transportation    Lack of Transportation (Medical): No    Lack of Transportation (Non-Medical): No   Octavio Graves, MSW, LCSW Clinical Social Worker II [facility] Heart/Vascular Care Navigation  [phone]- work cell phone (preferred) [phone]- desk phone

## 2022-06-06 ENCOUNTER — Ambulatory Visit (HOSPITAL_COMMUNITY): Payer: 59 | Attending: Cardiology

## 2022-06-14 ENCOUNTER — Other Ambulatory Visit: Payer: Self-pay

## 2022-06-14 ENCOUNTER — Other Ambulatory Visit (HOSPITAL_COMMUNITY): Payer: Self-pay

## 2022-06-14 ENCOUNTER — Other Ambulatory Visit (HOSPITAL_BASED_OUTPATIENT_CLINIC_OR_DEPARTMENT_OTHER): Payer: Self-pay | Admitting: Family Medicine

## 2022-06-14 MED ORDER — ATORVASTATIN CALCIUM 80 MG PO TABS
80.0000 mg | ORAL_TABLET | Freq: Every day | ORAL | 1 refills | Status: DC
  Filled 2022-06-14: qty 30, 30d supply, fill #0

## 2022-06-14 MED ORDER — WARFARIN SODIUM 2.5 MG PO TABS: ORAL_TABLET | ORAL | 1 refills | Status: DC

## 2022-06-14 MED ORDER — LEVOTHYROXINE SODIUM 50 MCG PO TABS
50.0000 ug | ORAL_TABLET | Freq: Every day | ORAL | 1 refills | Status: DC
  Filled 2022-06-14: qty 30, 30d supply, fill #0

## 2022-06-14 MED ORDER — ASPIRIN 81 MG PO TBEC
81.0000 mg | DELAYED_RELEASE_TABLET | Freq: Every day | ORAL | 1 refills | Status: DC
  Filled 2022-06-14: qty 30, 30d supply, fill #0

## 2022-06-17 ENCOUNTER — Other Ambulatory Visit (HOSPITAL_COMMUNITY): Payer: Self-pay

## 2022-06-20 ENCOUNTER — Ambulatory Visit (HOSPITAL_COMMUNITY): Payer: 59 | Attending: Cardiology

## 2022-06-20 DIAGNOSIS — I050 Rheumatic mitral stenosis: Secondary | ICD-10-CM | POA: Insufficient documentation

## 2022-06-20 LAB — ECHOCARDIOGRAM COMPLETE
Area-P 1/2: 1.53 cm2
MV VTI: 0.4 cm2
P 1/2 time: 155 msec
S' Lateral: 3.1 cm

## 2022-06-22 ENCOUNTER — Ambulatory Visit: Payer: 59 | Attending: Internal Medicine | Admitting: *Deleted

## 2022-06-22 DIAGNOSIS — Z8673 Personal history of transient ischemic attack (TIA), and cerebral infarction without residual deficits: Secondary | ICD-10-CM

## 2022-06-22 DIAGNOSIS — I4821 Permanent atrial fibrillation: Secondary | ICD-10-CM

## 2022-06-22 DIAGNOSIS — Z7901 Long term (current) use of anticoagulants: Secondary | ICD-10-CM | POA: Diagnosis not present

## 2022-06-22 LAB — POCT INR: INR: 2.8 (ref 2.0–3.0)

## 2022-06-22 NOTE — Patient Instructions (Signed)
Description   Continue taking warfarin 2 tablets daily EXCEPT for 1 tablet on Mondays.  Recheck INR in 4 weeks. Stay consistent with 1 serving of greens per week.  Coumadin Clinic [phone] or [phone] (Goal 2.5-3.0 per Discharge Summary on 12/08/21)

## 2022-07-06 ENCOUNTER — Encounter (HOSPITAL_BASED_OUTPATIENT_CLINIC_OR_DEPARTMENT_OTHER): Payer: Self-pay | Admitting: Cardiology

## 2022-07-06 ENCOUNTER — Ambulatory Visit (INDEPENDENT_AMBULATORY_CARE_PROVIDER_SITE_OTHER): Payer: Commercial Managed Care - HMO | Admitting: Cardiology

## 2022-07-06 VITALS — BP 100/60 | HR 57 | Ht 59.0 in | Wt 107.5 lb

## 2022-07-06 DIAGNOSIS — I4821 Permanent atrial fibrillation: Secondary | ICD-10-CM | POA: Diagnosis not present

## 2022-07-06 DIAGNOSIS — R569 Unspecified convulsions: Secondary | ICD-10-CM

## 2022-07-06 DIAGNOSIS — I050 Rheumatic mitral stenosis: Secondary | ICD-10-CM | POA: Diagnosis not present

## 2022-07-06 DIAGNOSIS — Z8673 Personal history of transient ischemic attack (TIA), and cerebral infarction without residual deficits: Secondary | ICD-10-CM

## 2022-07-06 DIAGNOSIS — Z7901 Long term (current) use of anticoagulants: Secondary | ICD-10-CM | POA: Diagnosis not present

## 2022-07-06 NOTE — Progress Notes (Signed)
Cardiology Office Note:    Date:  07/06/2022   ID:  [name], DOB 1961/10/02, MRN [id number]  PCP:  de Poole, Christy J, MD  Cardiologist:  Christy Dresser, MD  Referring MD: de Poole, Christy Reveal, MD   CC: follow up  History of Present Illness:    Christy Poole is a 61 y.o. 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 Initially seen by Dr. Sallyanne Poole in the hospital 11/2021.  Cardiac history: Rheumatic mitral stenosis, s/p balloon valvuloplasty in [place] in ~2016/2017. Reported prior cath without significant CAD. Followed in [place] and then [facility] in [place]. History of TIA/CVA as well, initial 2019 and most recent 11/2021. Echo 11/2021 with moderate MS, Wilkins score 7-8. Permanent atrial fibrillation. On coumadin.  CV risk factors: She is vegetarian and eats primarily [place] food. She does not exercise regularly. Father had MI, brother had bypass surgery.  Today:  She is accompanied by her son, who acts as her historian and Optometrist. He states that she has still been experiencing tiredness, weakness, shortness of breath, and chest pain.  On 06/02/2022, she had epileptic episodes two times, once in the morning around 3 am or 4 am while laying down and around 2 pm later in the day. In these episodes, she fully passes out for five minutes with all her limbs shaking. She also has had full loss of control of her bowel and bladder during these episodes. Afterwards, she recognizes that something happened but she doesn't remember what. Her son states that these episodes have happened four times in total since they have moved here. She has not been seen by neurology since 01/2022. I recommended they notify their neurology provider, see below.  She denies any palpitations or peripheral edema. No lightheadedness, headaches, orthopnea, or PND.   Past Medical History:   Diagnosis Date   Atrial fibrillation ([place])    Diabetes mellitus without complication ([place])    Stroke [facility])    Thyroid disease     No past surgical history on file.  Current Medications: Current Outpatient Medications on File Prior to Visit  Medication Sig   acetaminophen (TYLENOL) 500 MG tablet Take 1,000 mg by mouth every 6 (six) hours as needed for mild pain, headache or fever.   aspirin EC 81 MG tablet Take 1 tablet (81 mg total) by mouth daily. (swallow whole)   atorvastatin (LIPITOR) 80 MG tablet Take 1 tablet (80 mg total) by mouth daily.   levothyroxine (SYNTHROID) 50 MCG tablet Take 1 tablet (50 mcg total) by mouth at bedtime.   metoprolol succinate (TOPROL-XL) 50 MG 24 hr tablet Take 1.5 tablets (75 mg total) by mouth daily.   warfarin (COUMADIN) 2.5 MG tablet TAKE 1-2 TABLETS DAILY OR AS PRESCRIBED BY COUMADIN CLINIC   No current facility-administered medications on file prior to visit.     Allergies:   Patient has no known allergies.   Social History   Tobacco Use   Smoking status: Never   Smokeless tobacco: Never  Vaping Use   Vaping Use: Never used  Substance Use Topics   Alcohol use: Never   Drug use: Never    Family History: Father had MI, brother had bypass surgery.  ROS:   Please see the history of present illness.   (+) Shortness of breath (+) Chest pain (+) Tiredness (+) Weakness  Additional pertinent ROS otherwise unremarkable.  EKGs/Labs/Other Studies Reviewed:    The following studies were reviewed today:  Echo 06/20/2022: 1. Left ventricular ejection fraction, by estimation, is 50 to 55%. The  left ventricle has low normal function. The left ventricle has no regional  wall motion abnormalities. There is mild left ventricular hypertrophy.  Left ventricular diastolic  parameters are indeterminate.   2. Right ventricular systolic function is mildly reduced. The right  ventricular size is normal. There is normal pulmonary artery systolic   pressure. The estimated right ventricular systolic pressure is 29.0 mmHg.   3. The aortic valve is tricuspid. Aortic valve regurgitation is trivial.  Aortic valve sclerosis is present, with no evidence of aortic valve  stenosis.   4. The inferior vena cava is normal in size with greater than 50%  respiratory variability, suggesting right atrial pressure of 3 mmHg.   5. The mitral valve is degenerative. Trivial mitral valve regurgitation.  Moderate to severe mitral stenosis. The mean mitral valve gradient is 8.0  mmHg with average heart rate of 48 bpm. Severe mitral annular  calcification. MG suggests moderate MS, but  MVA 0.5cm^2 by continuity equation suggesting severe MS. Consider TEE for  further evaluation    CTA Head Neck 12/09/2021: IMPRESSION: 1. No acute intracranial pathology. Unchanged remote right MCA territory infarct. 2. Patent vasculature of the head and neck. 3. Mosaic attenuation in the lung apices suggestive of air trapping/small airway disease. 4. Probable lymphadenopathy in the AP window. Recommend contrast enhanced CT of the chest for further evaluation. This may be performed on a nonemergent basis as indicated.   Echo 12/09/2021 1. There is severe, calcific mitral stenosis with average mean gradient  at HR 67. MVA by VTI 1.0cm2.   2. Left ventricular ejection fraction, by estimation, is 45 to 50%. The  left ventricle has mildly decreased function. Left ventricular endocardial  border not optimally defined to evaluate regional wall motion. Diastolic  function is indeterminant due to  Afib.   3. Right ventricular systolic function is normal. The right ventricular  size is normal.   4. Left atrial size was severely dilated.   5. Right atrial size was moderately dilated.   6. The mitral valve is abnormal. Mild mitral valve regurgitation. Severe  mitral stenosis. The mean mitral valve gradient is 8.5 mmHg. Severe mitral  annular calcification.   7. The  aortic valve is tricuspid. There is mild calcification of the  aortic valve. There is mild thickening of the aortic valve. Aortic valve  regurgitation is not visualized. Aortic valve sclerosis/calcification is  present, without any evidence of  aortic stenosis.   8. The inferior vena cava is normal in size with greater than 50%  respiratory variability, suggesting right atrial pressure of 3 mmHg.   Comparison(s): No prior Echocardiogram.   EKG:  EKG is personally reviewed.   07/06/22: atrial fibrillation at 57 bpm 01/03/22: atrial fibrillation at 82 bpm  Recent Labs: 12/01/2021: TSH 2.620 12/15/2021: ALT 40; BUN 12; Creatinine, Ser 0.80; Hemoglobin 14.1; Platelets 169; Potassium 4.4; Sodium 137  Recent Lipid Panel    Component Value Date/Time   CHOL 104 12/01/2021 0920   TRIG 84 12/01/2021 0920   HDL 51 12/01/2021 0920   CHOLHDL 2.0 12/01/2021 0920   LDLCALC 36 12/01/2021 0920    Physical Exam:    VS:  BP 100/60 (BP Location: Left Arm, Patient Position: Sitting, Cuff Size: Normal)   Pulse (!) 57   Ht 4\' 11"  (1.499 m)   Wt 107  lb 8 oz (48.8 kg)   BMI 21.71 kg/m     Orthostatics: Lying 136/69, HR 76 Sitting 124/71, HR 60 Standing 136/81, HR 71 Standing 3 min 122/79, HR 66  Wt Readings from Last 3 Encounters:  05/11/22 109 lb 6.4 oz (49.6 kg)  04/05/22 109 lb (49.4 kg)  03/30/22 108 lb 11.2 oz (49.3 kg)    GEN: Well nourished, well developed in no acute distress HEENT: Normal, moist mucous membranes NECK: No JVD CARDIAC: irregularly irregular rhythm, normal S1 and S2, no rubs or gallops. 1/4 diastolic rumble VASCULAR: Radial and DP pulses 2+ bilaterally. No carotid bruits RESPIRATORY:  Bilateral velcro crackles throughout, most prominently anterior, without wheezing or rhonchi, l basilar atelectasis ABDOMEN: Soft, non-tender, non-distended MUSCULOSKELETAL:  Ambulates independently SKIN: Warm and dry, no edema NEUROLOGIC:  Alert and oriented x 3. No focal neuro deficits  noted. PSYCHIATRIC:  Normal affect    ASSESSMENT:    1. Permanent atrial fibrillation ([place])   2. Rheumatic mitral stenosis   3. History of stroke   4. Long term (current) use of anticoagulants   5. Seizure-like activity (HCC)     PLAN:    Loss of consciousness, highly suggestive of seizure -I recommend contacting their neurology provider to discuss further. May need adjustments in her treatment plan   Rheumatic mitral stenosis -agree with Dr. Sallyanne Poole that echo 11/2021 likely moderate mitral stenosis. S/p prior balloon valvuloplasty, Wilkins score 7-8 based on most recent images -tolerating 75 mg metoprolol dose -suspect exertional chest pain/shortness of breath is stable angina due to overall decreased perfusion with fast heart rates -reviewed red flag warning signs that need immediate medical attention -they will contact me with any new concerns or worsening symptoms -repeat echo 05/2022 reviewed  Permanent atrial fibrillation Prior TIA History of CVA in 2019 with left facial and left arm deficits -given that this is valvular atrial fibrillation, needs to be on coumadin (no DOAC) -on aspirin, atorvastatin as well as coumadin, denies bleeding issues  Velcro crackles -I reviewed her CXR. Read suggests emphysematous changes and interstitial thickening -no dedicated CT chest available to me, but visualized portions in the CT head and neck note patchy ground glass opacities. Also noted probable lymphadenopathy in the chest, recommended for nonemergent CT chest with contrast -they have seen Dr. De Poole, working to get CT covered by insurance plan. Son asked for social work referral today to see if they can get better medical coverage given her conditions  Cardiac risk counseling and prevention recommendations: -recommend heart healthy/Mediterranean diet, with whole grains, fruits, vegetable, fish, lean meats, nuts, and olive oil. Limit salt. -recommend moderate walking, 3-5 times/week  for 30-50 minutes each session. Aim for at least 150 minutes.week. Goal should be pace of 3 miles/hours, or walking 1.5 miles in 30 minutes -recommend avoidance of tobacco products. Avoid excess alcohol.  Plan for follow up: 6 months.   Christy Dresser, MD, PhD, [facility]    Medication Adjustments/Labs and Tests Ordered: Current medicines are reviewed at length with the patient today.  Concerns regarding medicines are outlined above.  Orders Placed This Encounter  Procedures   EKG 12-Lead   No orders of the defined types were placed in this encounter.   Patient Instructions  Medication Instructions:  Your Physician recommend you continue on your current medication as directed.    *If you need a refill on your cardiac medications before your next appointment, please call your pharmacy*   Lab Work: None  ordered today   Testing/Procedures: None ordered today   Follow-Up: At [facility], you and your health needs are our priority.  As part of our continuing mission to provide you with exceptional heart care, we have created designated Provider Care Teams.  These Care Teams include your primary Cardiologist (physician) and Advanced Practice Providers (APPs -  Physician Assistants and Nurse Practitioners) who all work together to provide you with the care you need, when you need it.  We recommend signing up for the patient portal called "MyChart".  Sign up information is provided on this After Visit Summary.  MyChart is used to connect with patients for Virtual Visits (Telemedicine).  Patients are able to view lab/test results, encounter notes, upcoming appointments, etc.  Non-urgent messages can be sent to your provider as well.   To learn more about what you can do with MyChart, go to [email or web address].    Your next appointment:   6 month(s)  The format for your next appointment:   In Person  Provider:   Jodelle Red, MD      I am concerned that she is having seizures. I will send a note to your neurology and PCP providers. We cannot do anything with the valve until we have that figured out (high risk for anesthesia).    I,Breanna Adamick,acting as a Neurosurgeon for [employer], MD.,have documented all relevant documentation on the behalf of Jodelle Red, MD,as directed by  Jodelle Red, MD while in the presence of Jodelle Red, MD.   I, Jodelle Red, MD, have reviewed all documentation for this visit. The documentation on 07/25/22 for the exam, diagnosis, procedures, and orders are all accurate and complete.   Signed, Jodelle Red, MD PhD 07/06/2022     [facility]

## 2022-07-06 NOTE — Patient Instructions (Addendum)
Medication Instructions:  Your Physician recommend you continue on your current medication as directed.    *If you need a refill on your cardiac medications before your next appointment, please call your pharmacy*   Lab Work: None ordered today   Testing/Procedures: None ordered today   Follow-Up: At [facility], you and your health needs are our priority.  As part of our continuing mission to provide you with exceptional heart care, we have created designated Provider Care Teams.  These Care Teams include your primary Cardiologist (physician) and Advanced Practice Providers (APPs -  Physician Assistants and Nurse Practitioners) who all work together to provide you with the care you need, when you need it.  We recommend signing up for the patient portal called "MyChart".  Sign up information is provided on this After Visit Summary.  MyChart is used to connect with patients for Virtual Visits (Telemedicine).  Patients are able to view lab/test results, encounter notes, upcoming appointments, etc.  Non-urgent messages can be sent to your provider as well.   To learn more about what you can do with MyChart, go to [email or web address].    Your next appointment:   6 month(s)  The format for your next appointment:   In Person  Provider:   Jodelle Red, MD     I am concerned that she is having seizures. I will send a note to your neurology and PCP providers. We cannot do anything with the valve until we have that figured out (high risk for anesthesia).

## 2022-07-11 ENCOUNTER — Other Ambulatory Visit (HOSPITAL_BASED_OUTPATIENT_CLINIC_OR_DEPARTMENT_OTHER): Payer: Self-pay | Admitting: Cardiology

## 2022-07-11 DIAGNOSIS — I4821 Permanent atrial fibrillation: Secondary | ICD-10-CM

## 2022-07-11 DIAGNOSIS — I050 Rheumatic mitral stenosis: Secondary | ICD-10-CM

## 2022-07-11 MED ORDER — ASPIRIN 81 MG PO TBEC: 81.0000 mg | DELAYED_RELEASE_TABLET | Freq: Every day | ORAL | 1 refills | Status: DC

## 2022-07-11 MED ORDER — ATORVASTATIN CALCIUM 80 MG PO TABS: 80.0000 mg | ORAL_TABLET | Freq: Every day | ORAL | 1 refills | Status: DC

## 2022-07-11 MED ORDER — METOPROLOL SUCCINATE ER 50 MG PO TB24: 75.0000 mg | ORAL_TABLET | Freq: Every day | ORAL | 3 refills | Status: DC

## 2022-07-11 MED ORDER — WARFARIN SODIUM 2.5 MG PO TABS: ORAL_TABLET | ORAL | 0 refills | Status: DC

## 2022-07-11 NOTE — Telephone Encounter (Signed)
Prescription refill request received for warfarin Lov: 07/06/22 Har[place] Gave)  Next INR check: 07/21/22 Warfarin tablet strength: 2.5mg   Appropriate dose and refill sent to requested pharmacy.

## 2022-07-11 NOTE — Telephone Encounter (Signed)
Rx request for ASA 81, Atorvastatin, and Metoprolol sent to pharmacy. Levothyroxine is non cardiac, so will defer to PCP. Routing to PharmD to review for refill of Warfarin.

## 2022-07-11 NOTE — Telephone Encounter (Signed)
*  STAT* If patient is at the pharmacy, call can be transferred to refill team.   1. Which medications need to be refilled? (please list name of each medication and dose if known) aspirin EC 81 MG tablet  atorvastatin (LIPITOR) 80 MG tablet  levothyroxine (SYNTHROID) 50 MCG tablet  metoprolol succinate (TOPROL-XL) 50 MG 24 hr tablet  warfarin (COUMADIN) 2.5 MG tablet  2. Which pharmacy/location (including street and city if local pharmacy) is medication to be sent to? [place], [place]  3. Do they need a 30 day or 90 day supply? [place]

## 2022-07-13 ENCOUNTER — Other Ambulatory Visit (HOSPITAL_BASED_OUTPATIENT_CLINIC_OR_DEPARTMENT_OTHER): Payer: Self-pay | Admitting: Family Medicine

## 2022-07-13 MED ORDER — LEVOTHYROXINE SODIUM 50 MCG PO TABS: 50.0000 ug | ORAL_TABLET | Freq: Every day | ORAL | 0 refills | Status: DC

## 2022-07-21 ENCOUNTER — Ambulatory Visit: Payer: Commercial Managed Care - HMO | Attending: Cardiovascular Disease

## 2022-07-21 DIAGNOSIS — Z5181 Encounter for therapeutic drug level monitoring: Secondary | ICD-10-CM | POA: Diagnosis not present

## 2022-07-21 DIAGNOSIS — Z7901 Long term (current) use of anticoagulants: Secondary | ICD-10-CM | POA: Diagnosis not present

## 2022-07-21 DIAGNOSIS — I4821 Permanent atrial fibrillation: Secondary | ICD-10-CM | POA: Diagnosis not present

## 2022-07-21 DIAGNOSIS — Z8673 Personal history of transient ischemic attack (TIA), and cerebral infarction without residual deficits: Secondary | ICD-10-CM | POA: Diagnosis not present

## 2022-07-21 LAB — POCT INR: INR: 3.9 — AB (ref 2.0–3.0)

## 2022-07-21 NOTE — Patient Instructions (Signed)
HOLD TONIGHT ONLY and then Continue taking warfarin 2 tablets daily EXCEPT for 1 tablet on Mondays.  Recheck INR in 2 weeks. Stay consistent with 1 serving of greens per week.  Coumadin Clinic [phone] or [phone] (Goal 2.5-3.0 per Discharge Summary on 12/08/21)

## 2022-07-25 ENCOUNTER — Encounter (HOSPITAL_BASED_OUTPATIENT_CLINIC_OR_DEPARTMENT_OTHER): Payer: Self-pay | Admitting: Cardiology

## 2022-07-29 ENCOUNTER — Telehealth: Payer: Self-pay | Admitting: Licensed Clinical Social Worker

## 2022-07-29 NOTE — Telephone Encounter (Signed)
H&V Care Navigation CSW Progress Note  Clinical Social Worker contacted caregiver by phone (pt son Orson Gear, [place] on file) to f/u on pt. [name] reached at [phone]. He confirms pt received new insurance, [employer], since [employer] is no longer active. Pt doesn't have a card yet- he is able to log into app and get member ID etc as needed. Pt has had several "episodes" so they are trying to get her in to see neurology. Still no word on disability application status- she has been asked to schedule several more appts with DDS for further evaluation. Pt son states he will let us know if any updates.   Patient is participating in a Managed Medicaid Plan:  No, [place]: Food Insecurity Present (06/02/2022)  Housing: Low Risk  (06/02/2022)  Transportation Needs: No Transportation Needs (06/02/2022)  Depression (PHQ2-9): Low Risk  (05/11/2022)  Financial Resource Strain: High Risk (04/06/2022)  Tobacco Use: Low Risk  (07/25/2022)    Westley Hummer, MSW, [place]  [phone]- work cell phone (preferred) [phone]- desk phone

## 2022-08-05 ENCOUNTER — Ambulatory Visit: Payer: Commercial Managed Care - HMO

## 2022-08-08 ENCOUNTER — Ambulatory Visit: Payer: Commercial Managed Care - HMO | Attending: Cardiology | Admitting: *Deleted

## 2022-08-08 DIAGNOSIS — I4821 Permanent atrial fibrillation: Secondary | ICD-10-CM | POA: Diagnosis not present

## 2022-08-08 DIAGNOSIS — Z8673 Personal history of transient ischemic attack (TIA), and cerebral infarction without residual deficits: Secondary | ICD-10-CM | POA: Diagnosis not present

## 2022-08-08 DIAGNOSIS — Z7901 Long term (current) use of anticoagulants: Secondary | ICD-10-CM | POA: Diagnosis not present

## 2022-08-08 LAB — POCT INR: INR: 4.9 — AB (ref 2.0–3.0)

## 2022-08-08 NOTE — Patient Instructions (Signed)
Description   HOLD TONIGHT'S DOSE AND HOLD TOMORROW'S DOSE OF WARFARIN then START taking warfarin 2 tablets daily EXCEPT for 1 tablet on Mondays and Fridays.   Recheck INR in 2 weeks. Stay consistent with 1 serving of greens per week.  Coumadin Clinic [phone] or [phone] (Goal 2.5-3.0 per Discharge Summary on 12/08/21)

## 2022-08-10 ENCOUNTER — Ambulatory Visit (INDEPENDENT_AMBULATORY_CARE_PROVIDER_SITE_OTHER): Payer: Commercial Managed Care - HMO | Admitting: Family Medicine

## 2022-08-10 ENCOUNTER — Encounter (HOSPITAL_BASED_OUTPATIENT_CLINIC_OR_DEPARTMENT_OTHER): Payer: Self-pay | Admitting: Family Medicine

## 2022-08-10 VITALS — BP 136/77 | HR 65 | Temp 97.6°F | Ht 59.0 in | Wt 108.6 lb

## 2022-08-10 DIAGNOSIS — Z8673 Personal history of transient ischemic attack (TIA), and cerebral infarction without residual deficits: Secondary | ICD-10-CM | POA: Diagnosis not present

## 2022-08-10 DIAGNOSIS — R911 Solitary pulmonary nodule: Secondary | ICD-10-CM

## 2022-08-10 DIAGNOSIS — R569 Unspecified convulsions: Secondary | ICD-10-CM | POA: Diagnosis not present

## 2022-08-10 DIAGNOSIS — E119 Type 2 diabetes mellitus without complications: Secondary | ICD-10-CM | POA: Diagnosis not present

## 2022-08-10 LAB — HEMOGLOBIN A1C
Est. average glucose Bld gHb Est-mCnc: 140 mg/dL
Hgb A1c MFr Bld: 6.5 % — ABNORMAL HIGH (ref 4.8–5.6)

## 2022-08-10 NOTE — Assessment & Plan Note (Signed)
We previously had ordered CT of the chest in order to further assess observed pulmonary nodule, however patient did not have it scheduled and now has new insurance in place.  Patient and son are requesting to have new order placed for CT scan so that this may be arranged New CT order placed today

## 2022-08-10 NOTE — Patient Instructions (Signed)
  Medication Instructions:  Your physician recommends that you continue on your current medications as directed. Please refer to the Current Medication list given to you today. --If you need a refill on any your medications before your next appointment, please call your pharmacy first. If no refills are authorized on file call the office.-- Lab Work: Your physician has recommended that you have lab work today: Yes If you have labs (blood work) drawn today and your tests are completely normal, you will receive your results via [facility] a phone call from our staff.  Please ensure you check your voicemail in the event that you authorized detailed messages to be left on a delegated number. If you have any lab test that is abnormal or we need to change your treatment, we will call you to review the results.  Referrals/Procedures/Imaging: Neurology  Follow-Up: Your next appointment:   Your physician recommends that you schedule a follow-up appointment in: 3-4 months with Dr. de [place].  You will receive a text message or e-mail with a link to a survey about your care and experience with [place] today! We would greatly appreciate your feedback!   Thanks for letting us be apart of your health journey!!  Primary Care and Sports Medicine   Dr. Arlina Robes [place]   We encourage you to activate your patient portal called "MyChart".  Sign up information is provided on this After Visit Summary.  MyChart is used to connect with patients for Virtual Visits (Telemedicine).  Patients are able to view lab/test results, encounter notes, upcoming appointments, etc.  Non-urgent messages can be sent to your provider as well. To learn more about what you can do with MyChart, please visit --  [email or web address].

## 2022-08-10 NOTE — Progress Notes (Signed)
    Procedures performed today:    None.  Independent interpretation of notes and tests performed by another provider:   None.  Brief History, Exam, Impression, and Recommendations:    BP 136/77   Pulse 65   Temp 97.6 F (36.4 C) (Oral)   Ht 4\' 11"  (1.499 m)   Wt 108 lb 9.6 oz (49.3 kg)   SpO2 98%   BMI 21.93 kg/m   Patient is accompanied to the office by her son who assist with translation at patient request  Pulmonary nodule We previously had ordered CT of the chest in order to further assess observed pulmonary nodule, however patient did not have it scheduled and now has new insurance in place.  Patient and son are requesting to have new order placed for CT scan so that this may be arranged New CT order placed today  Diabetes mellitus ([place]) She continues with lifestyle modifications, no medications being utilized at this time.  Most recent hemoglobin A1c was at goal at 5.7%.  Last check was about 4 months ago now.  We will recheck hemoglobin A1c today for monitoring, recommend continuing with lifestyle modifications  Seizure-like activity (HCC) Patient and son report that she has had intermittent episodes where she will have epileptic episodes in which she fully passes out for about 5 minutes, she will have shaking in all her limbs, has had loss of bowel and bladder during these episodes.  Following the episode, she is aware that something is happening, however cannot recall what.  She has had 4 total episodes, most recent was about 2 months ago.  She has had prior evaluation with neurology, primarily related to her history of stroke.  She unfortunately has not had recent follow-up and they have not obtained for further evaluation following these reported episodes. On exam, vital signs are stable, she is in no acute distress, able to speak in complete sentences, no slurring of speech, no gait abnormalities. Given these recurrent episodes, would recommend further evaluation with  neurology.  Patient and son do request referral to alternative neurologist, requesting referral to a Dr. Posey Pronto at [facility] neurology, referral placed today.  Did discuss that in the meantime, she may continue with seeing her prior neurologist for further evaluation and recommendations  Return in about 3 months (around 11/10/2022) for DM, hypothyroidism.   ___________________________________________ Christy Poole de [place], MD, ABFM, CAQSM Primary Care and [place]

## 2022-08-10 NOTE — Assessment & Plan Note (Signed)
She continues with lifestyle modifications, no medications being utilized at this time.  Most recent hemoglobin A1c was at goal at 5.7%.  Last check was about 4 months ago now.  We will recheck hemoglobin A1c today for monitoring, recommend continuing with lifestyle modifications

## 2022-08-10 NOTE — Assessment & Plan Note (Signed)
Patient and son report that she has had intermittent episodes where she will have epileptic episodes in which she fully passes out for about 5 minutes, she will have shaking in all her limbs, has had loss of bowel and bladder during these episodes.  Following the episode, she is aware that something is happening, however cannot recall what.  She has had 4 total episodes, most recent was about 2 months ago.  She has had prior evaluation with neurology, primarily related to her history of stroke.  She unfortunately has not had recent follow-up and they have not obtained for further evaluation following these reported episodes. On exam, vital signs are stable, she is in no acute distress, able to speak in complete sentences, no slurring of speech, no gait abnormalities. Given these recurrent episodes, would recommend further evaluation with neurology.  Patient and son do request referral to alternative neurologist, requesting referral to a Dr. Posey Pronto at [facility] neurology, referral placed today.  Did discuss that in the meantime, she may continue with seeing her prior neurologist for further evaluation and recommendations

## 2022-08-10 NOTE — Assessment & Plan Note (Signed)
As above, we will refer patient to establish with new neurologist at her and family's preference

## 2022-08-23 ENCOUNTER — Ambulatory Visit: Payer: Commercial Managed Care - HMO | Attending: Cardiovascular Disease | Admitting: *Deleted

## 2022-08-23 DIAGNOSIS — Z8673 Personal history of transient ischemic attack (TIA), and cerebral infarction without residual deficits: Secondary | ICD-10-CM | POA: Diagnosis not present

## 2022-08-23 DIAGNOSIS — Z7901 Long term (current) use of anticoagulants: Secondary | ICD-10-CM

## 2022-08-23 DIAGNOSIS — Z5181 Encounter for therapeutic drug level monitoring: Secondary | ICD-10-CM | POA: Diagnosis not present

## 2022-08-23 DIAGNOSIS — I4821 Permanent atrial fibrillation: Secondary | ICD-10-CM

## 2022-08-23 LAB — POCT INR: POC INR: 2.9

## 2022-08-23 NOTE — Patient Instructions (Signed)
Description   Continue taking warfarin 2 tablets daily EXCEPT for 1 tablet on Mondays and Fridays.   Recheck INR in 3 weeks. Stay consistent with 1 serving of greens per week.  Coumadin Clinic [phone] or [phone] (Goal 2.5-3.0 per Discharge Summary on 12/08/21)

## 2022-08-24 ENCOUNTER — Ambulatory Visit: Payer: Commercial Managed Care - HMO | Admitting: Neurology

## 2022-08-24 ENCOUNTER — Encounter: Payer: Self-pay | Admitting: Neurology

## 2022-08-24 VITALS — BP 127/73 | HR 80 | Ht 59.0 in | Wt 109.0 lb

## 2022-08-24 DIAGNOSIS — R569 Unspecified convulsions: Secondary | ICD-10-CM

## 2022-08-24 DIAGNOSIS — Z8673 Personal history of transient ischemic attack (TIA), and cerebral infarction without residual deficits: Secondary | ICD-10-CM | POA: Diagnosis not present

## 2022-08-24 DIAGNOSIS — R55 Syncope and collapse: Secondary | ICD-10-CM

## 2022-08-24 MED ORDER — LEVETIRACETAM 250 MG PO TABS: 250.0000 mg | ORAL_TABLET | Freq: Two times a day (BID) | ORAL | 3 refills | Status: DC

## 2022-08-24 NOTE — Patient Instructions (Signed)
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  I recommend a trial of Keppra  250 mg twice daily and check EEG.  Continue warfarin for stroke prevention for her valvular atrial fibrillation and maintain aggressive risk factor modification with strict control of hypertension with blood pressure goal below 140/96 with LDL cholesterol goal below 70 mg percent.  She will return for follow-up in the future with me in 3 months or call earlier if necessary.  Levetiracetam Tablets What is this medication? LEVETIRACETAM (lee ve tye RA se tam) prevents and controls seizures in people with epilepsy. It works by calming overactive nerves in your body. This medicine may be used for other purposes; ask your health care provider or pharmacist if you have questions. COMMON BRAND NAME(S): Keppra, Roweepra What should I tell my care team before I take this medication? They need to know if you have any of these conditions: Kidney disease Suicidal thoughts, plans, or attempt by you or a family member An unusual or allergic reaction to levetiracetam, other medications, foods, dyes, or preservatives Pregnant or trying to get pregnant Breast-feeding How should I use this medication? Take this medication by mouth with a glass of water. Follow the directions on the prescription label. Swallow the tablets whole. Do not crush or chew this medication. You may take this medication with or without food. Take your doses at regular intervals. Do not take your medication more often than directed. Do not stop taking this medication or any of your seizure medications unless instructed by your care team. Stopping your medication suddenly can increase your seizures or their severity. A special MedGuide will be given to you by the pharmacist  with each prescription and refill. Be sure to read this information carefully each time. Contact your care team about the use of this medication in children. While this medication may be prescribed for children as young as 47 years of age for selected conditions, precautions do apply. Overdosage: If you think you have taken too much of this medicine contact a poison control center or emergency room at once. NOTE: This medicine is only for you. Do not share this medicine with others. What if I miss a dose? If you miss a dose, take it as soon as you can. If it is almost time for your next dose, take only that dose. Do not take double or extra doses. What may interact with this medication? This medication may interact with the following: Carbamazepine Colesevelam Probenecid Sevelamer This list may not describe all possible interactions. Give your health care provider a list of all the medicines, herbs, non-prescription drugs, or dietary supplements you use. Also tell them if you smoke, drink alcohol, or use illegal drugs. Some items may interact with your medicine. What should I watch for while using this medication? Visit your care team for a regular check on your progress. Wear a medical identification bracelet or chain to say you have epilepsy, and carry a card that lists all your medications. This medication may cause serious skin reactions. They can happen weeks to months after starting the medication. Contact your care team right away if you notice fevers or flu-like symptoms with a rash. The rash may be red or purple and then turn into blisters or peeling of the skin. You may also notice a red rash with  swelling of the face, lips, or lymph nodes in your neck or under your arms. It is important to take this medication exactly as instructed by your care team. When first starting treatment, your dose may need to be adjusted. It may take weeks or months before your dose is stable. You should contact  your care team if your seizures get worse or if you have any new types of seizures. This medication may affect your coordination, reaction time, or judgment. Do not drive or operate machinery until you know how this medication affects you. Sit up or stand slowly to reduce the risk of dizzy or fainting spells. Drinking alcohol with this medication can increase the risk of these side effects. This medication may cause thoughts of suicide or depression. This includes sudden changes in mood, behaviors, or thoughts. These changes can happen at any time but are more common in the beginning of treatment or after a change in dose. Call your care team right away if you experience these thoughts or worsening depression. If you become pregnant while using this medication, you may enroll in the [place] American Antiepileptic Drug Pregnancy Registry by calling [phone]. This registry collects information about the safety of antiepileptic medication use during pregnancy. What side effects may I notice from receiving this medication? Side effects that you should report to your care team as soon as possible: Allergic reactions--skin rash, itching, hives, swelling of the face, lips, tongue, or throat Increase in blood pressure in children Infection--fever, chills, cough, or sore throat Loss of balance or coordination Low red blood cell count--unusual weakness or fatigue, dizziness, headache, trouble breathing Mood and behavior changes--anxiety, nervousness, confusion, hallucinations, irritability, hostility, thoughts of suicide or self-harm, worsening mood, feelings of depression Redness, blistering, peeling, or loosening of the skin, including inside the mouth Trouble walking Unusual bruising or bleeding Unusual weakness or fatigue Side effects that usually do not require medical attention (report to your care team if they continue or are bothersome): Dizziness Drowsiness Fatigue Irritability Loss of  appetite This list may not describe all possible side effects. Call your doctor for medical advice about side effects. You may report side effects to FDA at 1-800-FDA-1088. Where should I keep my medication? Keep out of reach of children. Store at room temperature between 15 and 30 degrees C (59 and 86 degrees F). Throw away any unused medication after the expiration date. NOTE: This sheet is a summary. It may not cover all possible information. If you have questions about this medicine, talk to your doctor, pharmacist, or health care provider.  2023 Elsevier/Gold Standard (2021-02-05 00:00:00)

## 2022-08-24 NOTE — Progress Notes (Signed)
[facility] [address] [place]. [place] [address]       HOSPITAL FOLLOW UP NOTE  [name] Date of Birth:  1961/09/25 Medical Record Number:  [id number]   Reason for Referral:  hospital stroke follow up    SUBJECTIVE:   CHIEF COMPLAINT:  Chief Complaint  Patient presents with   Seizures    RM 20 with son [name] who is interpreting  Pt is well, here for hz of stroke & sz like activity     HPI:   Update 08/24/2022 : Patient is seen for follow-up today after last visit with [name] NP .She is accompanied by her son.   They inform us about a new complaint today that she has had multiple episodes of brief alteredLoses consciousness and some of them and tries to follow normal heart itself.  Most of the episodes he has awakened looking at her eyes and is staring and has some stiffness of the extremities this lasted about 5 to 10 minutes he is quite tired after that.  She has not been evaluated for seizures yet.  Most of these episodes occurred at night but will buckle during the day as well.  She was seen in the hospital in February for 2  episodes 1 week apart that she had lost consciousness and subsequently has noted some increased left-sided weakness.  MRI scan of the brain on 12/09/2021 had shown old right MCA infarct but no acute abnormalities.  CT angiogram neck showing no large vessel stenosis or occlusion repeat CT scan of the head on 12/15/2021 had also shown no acute abnormalities.  EEG was not ordered.  Patient was diagnosed with rheumatic mitral stenosis and 2017 subsequently had atrial fibrillation.  He was not on anticoagulation until 2019 when she had a large right MCA stroke.  He is tolerating warfarin well with INR which has been fairly stable.  She denies bruising bleeding or other side effects.  Prior Visit 02/06/22 [name] nurse practitioner )Ms. Christy EMMAMAE Poole is a 61 y.o. 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  Noted symptoms wax and wane.  Personally reviewed hospitalization pertinent progress notes, lab work and imaging.  Evaluated by Dr. Roda Shutters for likely right brain TIA likely due to A-fib on Coumadin with lower and INR level. CTH and MR brain negative for acute stroke, noted chronic moderate size cortical/subcortical right MCA territory infarct.  CTA head/neck unremarkable.  EF 45 to 50%.  LDL 36.  A1c 6.6.  Recommend continuation of warfarin and aspirin with increased INR goal at 2.5-3.0.  Continuation of atorvastatin 80 mg daily.  She was discharged home with recommended home health PT/OT.  Return to ED 2/22 with recurrence of left facial and left arm weakness lasting 5 to 10 minutes then resolved.  Repeat CT head no acute findings.  Diagnosed with TIA.  No further work-up completed as recently completed for similar symptoms.  First passing out, about 25 min after symptoms started Similar to second time    Today, 01/27/2022, patient being seen for hospital follow up accompanied by her son who assists with translation and providing majority of history. Overall stable from stroke standpoint.  Denies new or reoccurring stroke/TIA symptoms.  Continued left-sided weakness and short-term memory difficulties but at baseline. Continues  to work with [facility] PT. Ambulates without AD, no recent falls.  Son mentions syncopal episodes prior to her stroke in 2019 and about 25 minutes prior to recent TIA events. No additional syncopal events since that time. Also mentions headaches over the past 6-7 months, present on both sides of head and on top in pressure/band type sensation. Denies photophobia, phonophobia or N/V. Will take tylenol with resolution. Lives with son and daughter-in-law who assists with ADLs and majority  of IADLs.  Remains on aspirin and warfarin, denies side effects.  Routinely monitored by Coumadin clinic and has since establish care with cardiologist Dr. Harrell Gave.  Remains on atorvastatin, denies side effects.  Blood pressure today 131/74.  No further concerns at this time.       PERTINENT IMAGING  Per hospitalization 12/08/2021 CT no acute abnormality MRI no acute infarct, chronic moderate-sized cortical/subcortical right MCA territory infarct CT head and neck unremarkable 2D Echo EF 45 to 50% LDL 36 HgbA1c 6.6    ROS:   14 system review of systems performed and negative with exception of those listed in HPI  PMH:  Past Medical History:  Diagnosis Date   Atrial fibrillation ([place])    Diabetes mellitus without complication ([place])    Stroke ([place])    Thyroid disease     PSH: History reviewed. No pertinent surgical history.  Social History:  Social History   Socioeconomic History   Marital status: Widowed    Spouse name: Not on file   Number of children: Not on file   Years of education: Not on file   Highest education level: Not on file  Occupational History   Not on file  Tobacco Use   Smoking status: Never   Smokeless tobacco: Never  Vaping Use   Vaping Use: Never used  Substance and Sexual Activity   Alcohol use: Never   Drug use: Never   Sexual activity: Never  Other Topics Concern   Not on file  Social History Narrative   Not on file   Social Determinants of Health   Financial Resource Strain: High Risk (04/06/2022)   Overall Financial Resource Strain (CARDIA)    Difficulty of Paying Living Expenses: Very hard  Food Insecurity: Food Insecurity Present (06/02/2022)   Hunger Vital Sign    Worried About [place] in the Last Year: Sometimes true    Ran Out of Food in the Last Year: Sometimes true  Transportation Needs: No Transportation Needs (06/02/2022)   PRAPARE - Hydrologist (Medical): No    Lack of  Transportation (Non-Medical): No  Physical Activity: Not on file  Stress: Not on file  Social Connections: Not on file  Intimate Partner Violence: Not on file    Family History: History reviewed. No pertinent family history.  Medications:   Current Outpatient Medications on File Prior to Visit  Medication Sig Dispense Refill   acetaminophen (TYLENOL) 500 MG tablet Take 1,000 mg by mouth every 6 (six) hours as needed for mild pain, headache or fever.     aspirin EC 81 MG tablet Take 1 tablet (81 mg total) by mouth daily. (swallow whole) 90 tablet 1   atorvastatin (LIPITOR) 80 MG tablet Take 1 tablet (80 mg total) by mouth daily. 90 tablet 1   levothyroxine (SYNTHROID) 50 MCG tablet Take 1 tablet (50 mcg total) by mouth at bedtime. 90 tablet 0   metoprolol succinate (TOPROL-XL) 50 MG 24 hr tablet Take 1.5 tablets (75 mg  total) by mouth daily. 135 tablet 3   warfarin (COUMADIN) 2.5 MG tablet TAKE 1-2 TABLETS DAILY OR AS PRESCRIBED BY COUMADIN CLINIC 60 tablet 0   No current facility-administered medications on file prior to visit.    Allergies:  No Known Allergies    OBJECTIVE:  Physical Exam  Vitals:   08/24/22 1529  BP: 127/73  Pulse: 80  Weight: 109 lb (49.4 kg)  Height: 4\' 11"  (1.499 m)   Body mass index is 22.02 kg/m. No results found.  General: Frail petite, pleasant middle aged Asian [place] lady, seated, in no evident distress Head: head normocephalic and atraumatic.   Neck: supple with no carotid or supraclavicular bruits Cardiovascular: irregular rate and rhythm, no murmurs Musculoskeletal: no deformity Skin:  no rash/petichiae Vascular:  Normal pulses all extremities   Neurologic Exam Mental Status: Awake and fully alert. limited to no english - per son, mild dysarthria chronic. Denies aphasia. Oriented to place and time. Recent memory mildly impaired and remote memory intact. Son provides majority of hx due to language barrier. Mood and affect  appropriate.  Cranial Nerves: Pupils equal, briskly reactive to light. Extraocular movements full without nystagmus. Visual fields unable to test due to difficulty understanding directions, does blink to threat bilaterally. Hearing intact. Facial sensation intact. Face, tongue, palate moves normally and symmetrically.  Motor: Normal bulk and tone. Normal strength on right side. mild 4/5 left sided weakness chronic diminished fine finger movements on the left.  Mild left grip weakness.  Orbits right over left upper extremity. Sensory.: intact to touch , pinprick , position and vibratory sensation.  Coordination: Rapid alternating movements normal in all extremities except slightly decreased left hand. Finger-to-nose and heel-to-shin performed accurately bilaterally. Gait and Station: Arises from chair without difficulty. Stance is normal. Gait demonstrates normal stride length and with mild imbalance without use of AD. Unable to complete tandem walk and heel toe.  Reflexes: 1+ and symmetric. Toes downgoing.      NIHSS  1 (reported dysarthria)  Modified Rankin  2       ASSESSMENT: Kahliyah MELICIA ESQUEDA is a 61 y.o. year old female right brain TIA on 12/08/2021 and 12/15/2021 likely due to A-fib on Coumadin with lower and INR level. Vascular risk factors include history of right MCA stroke with residual left-sided weakness and facial weakness, hx of rheumatic mitral valve stenosis and A-fib on Coumadin, CAD s/p stent 2016, HLD and new dx of DM.  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  I recommend a trial of Keppra  250 mg twice daily and check EEG.  Continue warfarin for stroke prevention for her valvular atrial fibrillation and maintain aggressive risk factor modification with strict control of hypertension with blood pressure goal below 140/96 with LDL cholesterol goal below 70 mg percent.  She will return for follow-up in the future with me in 3 months or call earlier if necessary. Greater than 50% time during this 45-minute visit was spent on counseling and coordination of care about her remote stroke  and results of possible complex partial seizures Delia Heady, MD  [facility] [address] [place], [place] [address]  Phone [phone] Fax [phone] Note: This document was prepared with digital dictation and possible smart phrase technology. Any transcriptional errors that result from this process are unintentional.

## 2022-08-25 ENCOUNTER — Telehealth: Payer: Self-pay | Admitting: Licensed Clinical Social Worker

## 2022-08-25 NOTE — Telephone Encounter (Signed)
H&V Care Navigation CSW Progress Note    Clinical Social Worker contacted caregiver by phone (pt son Christy Poole, [place] on file) to f/u on pt. [name] reached at [phone]. Pt has been to see neurology yesterday about seizures. Still no word on disability application status- pt son shares that she is supposed to receive an update from [employer] in mail. He understands if continues to be a challenge that he may need to reach out to speak with a Chief Executive Officer. Pt son states he will let us know if any updates. We also discussed that Medicaid expansion starts December 1st and pt family should contact DSS at that time to see if pt may now be eligible. I have mailed them the expansion flyer for further assistance and encouraged son to let me know if I can be of any further assistance.    Patient is participating in a Managed Medicaid Plan:  No, [place]: Food Insecurity Present (06/02/2022)  Housing: Low Risk  (06/02/2022)  Transportation Needs: No Transportation Needs (06/02/2022)  Depression (PHQ2-9): Medium Risk (08/10/2022)  Financial Resource Strain: High Risk (04/06/2022)  Tobacco Use: Low Risk  (08/24/2022)    Westley Hummer, MSW, [place]  [phone]- work cell phone (preferred) [phone]- desk phone

## 2022-09-06 ENCOUNTER — Ambulatory Visit: Payer: Commercial Managed Care - HMO | Admitting: Neurology

## 2022-09-06 DIAGNOSIS — R569 Unspecified convulsions: Secondary | ICD-10-CM

## 2022-09-08 NOTE — Progress Notes (Signed)
Kindly inform the patient that EEG or brainwave study did not show any seizure activity and was normal

## 2022-09-13 ENCOUNTER — Ambulatory Visit: Payer: Commercial Managed Care - HMO | Attending: Internal Medicine

## 2022-09-13 DIAGNOSIS — Z8673 Personal history of transient ischemic attack (TIA), and cerebral infarction without residual deficits: Secondary | ICD-10-CM | POA: Diagnosis not present

## 2022-09-13 DIAGNOSIS — I4821 Permanent atrial fibrillation: Secondary | ICD-10-CM | POA: Diagnosis not present

## 2022-09-13 DIAGNOSIS — Z7901 Long term (current) use of anticoagulants: Secondary | ICD-10-CM

## 2022-09-13 LAB — POCT INR: INR: 3 (ref 2.0–3.0)

## 2022-09-13 NOTE — Patient Instructions (Signed)
Continue taking warfarin 2 tablets daily EXCEPT for 1 tablet on Mondays and Fridays.   Recheck INR in 5 weeks. Stay consistent with 1 serving of greens per week.  Coumadin Clinic [phone] or [phone] (Goal 2.5-3.0 per Discharge Summary on 12/08/21)

## 2022-09-19 ENCOUNTER — Other Ambulatory Visit (HOSPITAL_BASED_OUTPATIENT_CLINIC_OR_DEPARTMENT_OTHER): Payer: Self-pay | Admitting: Family Medicine

## 2022-09-19 ENCOUNTER — Encounter (HOSPITAL_BASED_OUTPATIENT_CLINIC_OR_DEPARTMENT_OTHER): Payer: Self-pay | Admitting: Family Medicine

## 2022-09-21 ENCOUNTER — Ambulatory Visit
Admission: RE | Admit: 2022-09-21 | Discharge: 2022-09-21 | Disposition: A | Discharge status: Home / Self Care | Payer: Commercial Managed Care - HMO | Source: Ambulatory Visit | Attending: Family Medicine | Admitting: Family Medicine

## 2022-09-21 DIAGNOSIS — R911 Solitary pulmonary nodule: Secondary | ICD-10-CM

## 2022-09-21 MED ORDER — IOPAMIDOL (ISOVUE-300) INJECTION 61%
75.0000 mL | Freq: Once | INTRAVENOUS | Status: AC | PRN
  Administered 2022-09-21: 75 mL via INTRAVENOUS

## 2022-09-22 ENCOUNTER — Encounter (HOSPITAL_BASED_OUTPATIENT_CLINIC_OR_DEPARTMENT_OTHER): Payer: Self-pay

## 2022-10-12 ENCOUNTER — Other Ambulatory Visit (HOSPITAL_BASED_OUTPATIENT_CLINIC_OR_DEPARTMENT_OTHER): Payer: Self-pay | Admitting: Family Medicine

## 2022-10-12 DIAGNOSIS — R911 Solitary pulmonary nodule: Secondary | ICD-10-CM

## 2022-10-18 ENCOUNTER — Ambulatory Visit: Payer: Commercial Managed Care - HMO

## 2022-10-28 ENCOUNTER — Institutional Professional Consult (permissible substitution) (HOSPITAL_BASED_OUTPATIENT_CLINIC_OR_DEPARTMENT_OTHER): Payer: Commercial Managed Care - HMO | Admitting: Pulmonary Disease

## 2022-10-28 ENCOUNTER — Ambulatory Visit: Payer: Medicaid Other | Attending: Cardiology

## 2022-10-28 DIAGNOSIS — Z8673 Personal history of transient ischemic attack (TIA), and cerebral infarction without residual deficits: Secondary | ICD-10-CM | POA: Diagnosis not present

## 2022-10-28 DIAGNOSIS — Z5181 Encounter for therapeutic drug level monitoring: Secondary | ICD-10-CM | POA: Diagnosis not present

## 2022-10-28 DIAGNOSIS — I050 Rheumatic mitral stenosis: Secondary | ICD-10-CM

## 2022-10-28 DIAGNOSIS — I4821 Permanent atrial fibrillation: Secondary | ICD-10-CM

## 2022-10-28 DIAGNOSIS — Z7901 Long term (current) use of anticoagulants: Secondary | ICD-10-CM

## 2022-10-28 LAB — POCT INR: INR: 2.3 (ref 2.0–3.0)

## 2022-10-28 MED ORDER — WARFARIN SODIUM 2.5 MG PO TABS: ORAL_TABLET | ORAL | 0 refills | Status: DC

## 2022-10-28 NOTE — Patient Instructions (Signed)
TAKE 1.5 TABLETS TODAY ONLY THEN Continue taking warfarin 2 tablets daily EXCEPT for 1 tablet on Mondays and Fridays.   Recheck INR in 5 weeks. Stay consistent with 1 serving of greens per week.  Coumadin Clinic [phone] or [phone] (Goal 2.5-3.0 per Discharge Summary on 12/08/21)

## 2022-11-10 ENCOUNTER — Ambulatory Visit (INDEPENDENT_AMBULATORY_CARE_PROVIDER_SITE_OTHER): Payer: Medicaid Other | Admitting: Family Medicine

## 2022-11-10 VITALS — BP 142/62 | HR 86 | Ht 59.0 in | Wt 112.7 lb

## 2022-11-10 DIAGNOSIS — E119 Type 2 diabetes mellitus without complications: Secondary | ICD-10-CM | POA: Diagnosis not present

## 2022-11-10 DIAGNOSIS — Z23 Encounter for immunization: Secondary | ICD-10-CM | POA: Diagnosis not present

## 2022-11-10 DIAGNOSIS — E039 Hypothyroidism, unspecified: Secondary | ICD-10-CM | POA: Diagnosis not present

## 2022-11-10 DIAGNOSIS — R911 Solitary pulmonary nodule: Secondary | ICD-10-CM

## 2022-11-10 DIAGNOSIS — R569 Unspecified convulsions: Secondary | ICD-10-CM | POA: Diagnosis not present

## 2022-11-10 MED ORDER — LEVOTHYROXINE SODIUM 50 MCG PO TABS: 50.0000 ug | ORAL_TABLET | Freq: Every day | ORAL | 0 refills | Status: DC

## 2022-11-10 NOTE — Progress Notes (Signed)
    Procedures performed today:    None.  Independent interpretation of notes and tests performed by another provider:   None.  Brief History, Exam, Impression, and Recommendations:    BP (!) 142/62 (BP Location: Left Arm, Patient Position: Sitting, Cuff Size: Small)   Pulse 86   Ht 4\' 11"  (1.499 m)   Wt 112 lb 11.2 oz (51.1 kg)   SpO2 100%   BMI 22.76 kg/m   Video interpreter [phone]  Pulmonary nodule Patient did have CT scan completed last month with evidence of multiple pulmonary nodules as well as a briskly enhancing rounded lesion in upper lung field.  Related to the pulmonary nodules and observed lesion, recommendation for close monitoring via CT scan and consideration for PET/CT, respectively.  Based on findings, she has been referred to pulmonology and does have appointment scheduled for next week. She has been having intermittent shortness of breath On exam, lungs with occasional expiratory wheezes Recommend that patient continue with scheduled evaluation with pulmonology next week, advised that this will be important in regards to further evaluation and management related to pulmonary issues, patient voiced understanding and agreement  Diabetes mellitus ([place]) Patient does indicate that she has had some more frequent urination, does not necessarily feel that she has had increased thirst however.  Her most recent hemoglobin A1c was 6.5%.  She continues to manage primarily with lifestyle modifications, no medications currently.  She is due for recheck of hemoglobin A1c at this time Given the reported polyuria, it is possible that blood sugars may be running higher and it may be more imperative for patient to initiate medication to help with controlling blood sugars.  We will follow-up on hemoglobin A1c result to determine recommendations  Hypothyroidism Patient continues with levothyroxine, has not had recent check of TSH for about 1 year.  She has had some generalized,  intermittent fatigue which could certainly be related to ongoing chronic medical issues, however additional consideration is any change in requirements for thyroid replacement therapy We will proceed with checking TSH today and adjust medication accordingly  Seizure-like activity [facility]) Patient has been taking Keppra as prescribed by neurology.  She does have follow-up coming up with neurology.  Symptoms/episodes have been improved with use of medication Recommend continuing with neurology recommendations and upcoming scheduled appointment  Return in about 3 months (around 02/09/2023).   ___________________________________________ Chase Arnall de [place], MD, ABFM, CAQSM [facility] [place]

## 2022-11-10 NOTE — Assessment & Plan Note (Signed)
Patient continues with levothyroxine, has not had recent check of TSH for about 1 year.  She has had some generalized, intermittent fatigue which could certainly be related to ongoing chronic medical issues, however additional consideration is any change in requirements for thyroid replacement therapy We will proceed with checking TSH today and adjust medication accordingly

## 2022-11-10 NOTE — Addendum Note (Signed)
Addended by: DE [place], Eithan Beagle J on: 11/10/2022 09:51 AM   Modules accepted: Orders

## 2022-11-10 NOTE — Assessment & Plan Note (Signed)
Patient has been taking Keppra as prescribed by neurology.  She does have follow-up coming up with neurology.  Symptoms/episodes have been improved with use of medication Recommend continuing with neurology recommendations and upcoming scheduled appointment

## 2022-11-10 NOTE — Assessment & Plan Note (Signed)
Patient does indicate that she has had some more frequent urination, does not necessarily feel that she has had increased thirst however.  Her most recent hemoglobin A1c was 6.5%.  She continues to manage primarily with lifestyle modifications, no medications currently.  She is due for recheck of hemoglobin A1c at this time Given the reported polyuria, it is possible that blood sugars may be running higher and it may be more imperative for patient to initiate medication to help with controlling blood sugars.  We will follow-up on hemoglobin A1c result to determine recommendations

## 2022-11-10 NOTE — Addendum Note (Signed)
Addended by: Lowella Bandy on: 11/10/2022 09:26 AM   Modules accepted: Orders

## 2022-11-10 NOTE — Assessment & Plan Note (Addendum)
Patient did have CT scan completed last month with evidence of multiple pulmonary nodules as well as a briskly enhancing rounded lesion in upper lung field.  Related to the pulmonary nodules and observed lesion, recommendation for close monitoring via CT scan and consideration for PET/CT, respectively.  Based on findings, she has been referred to pulmonology and does have appointment scheduled for next week. She has been having intermittent shortness of breath On exam, lungs with occasional expiratory wheezes Recommend that patient continue with scheduled evaluation with pulmonology next week, advised that this will be important in regards to further evaluation and management related to pulmonary issues, patient voiced understanding and agreement

## 2022-11-11 LAB — CBC WITH DIFFERENTIAL/PLATELET
Basophils Absolute: 0.1 10*3/uL (ref 0.0–0.2)
Basos: 2 %
EOS (ABSOLUTE): 0.2 10*3/uL (ref 0.0–0.4)
Eos: 3 %
Hematocrit: 41.3 % (ref 34.0–46.6)
Hemoglobin: 13.9 g/dL (ref 11.1–15.9)
Immature Grans (Abs): 0 10*3/uL (ref 0.0–0.1)
Immature Granulocytes: 0 %
Lymphocytes Absolute: 1.5 10*3/uL (ref 0.7–3.1)
Lymphs: 25 %
MCH: 29.4 pg (ref 26.6–33.0)
MCHC: 33.7 g/dL (ref 31.5–35.7)
MCV: 87 fL (ref 79–97)
Monocytes Absolute: 0.5 10*3/uL (ref 0.1–0.9)
Monocytes: 8 %
Neutrophils Absolute: 3.7 10*3/uL (ref 1.4–7.0)
Neutrophils: 62 %
Platelets: 171 10*3/uL (ref 150–450)
RBC: 4.73 x10E6/uL (ref 3.77–5.28)
RDW: 12.6 % (ref 11.7–15.4)
WBC: 6 10*3/uL (ref 3.4–10.8)

## 2022-11-11 LAB — TSH: TSH: 2.98 u[IU]/mL (ref 0.450–4.500)

## 2022-11-11 LAB — HEMOGLOBIN A1C
Est. average glucose Bld gHb Est-mCnc: 137 mg/dL
Hgb A1c MFr Bld: 6.4 % — ABNORMAL HIGH (ref 4.8–5.6)

## 2022-11-18 ENCOUNTER — Encounter (HOSPITAL_BASED_OUTPATIENT_CLINIC_OR_DEPARTMENT_OTHER): Payer: Self-pay | Admitting: Pulmonary Disease

## 2022-11-18 ENCOUNTER — Telehealth: Payer: Self-pay | Admitting: Pulmonary Disease

## 2022-11-18 ENCOUNTER — Other Ambulatory Visit: Payer: Self-pay | Admitting: Neurology

## 2022-11-18 ENCOUNTER — Ambulatory Visit (HOSPITAL_BASED_OUTPATIENT_CLINIC_OR_DEPARTMENT_OTHER): Payer: Medicaid Other | Admitting: Pulmonary Disease

## 2022-11-18 ENCOUNTER — Ambulatory Visit (INDEPENDENT_AMBULATORY_CARE_PROVIDER_SITE_OTHER): Payer: Medicaid Other | Admitting: Pulmonary Disease

## 2022-11-18 VITALS — BP 130/64 | HR 78 | Ht <= 58 in | Wt 112.0 lb

## 2022-11-18 DIAGNOSIS — R599 Enlarged lymph nodes, unspecified: Secondary | ICD-10-CM

## 2022-11-18 DIAGNOSIS — R0602 Shortness of breath: Secondary | ICD-10-CM

## 2022-11-18 LAB — PULMONARY FUNCTION TEST
FEF 25-75 Post: 1.26 L/sec
FEF 25-75 Pre: 0.78 L/sec
FEF2575-%Change-Post: 61 %
FEF2575-%Pred-Post: 67 %
FEF2575-%Pred-Pre: 41 %
FEV1-%Change-Post: 9 %
FEV1-%Pred-Post: 44 %
FEV1-%Pred-Pre: 40 %
FEV1-Post: 0.84 L
FEV1-Pre: 0.76 L
FEV1FVC-%Change-Post: 7 %
FEV1FVC-%Pred-Pre: 107 %
FEV6-%Change-Post: 2 %
FEV6-%Pred-Post: 39 %
FEV6-%Pred-Pre: 38 %
FEV6-Post: 0.94 L
FEV6-Pre: 0.92 L
FEV6FVC-%Pred-Post: 104 %
FEV6FVC-%Pred-Pre: 104 %
FVC-%Change-Post: 2 %
FVC-%Pred-Post: 38 %
FVC-%Pred-Pre: 37 %
FVC-Post: 0.94 L
FVC-Pre: 0.92 L
Post FEV1/FVC ratio: 89 %
Post FEV6/FVC ratio: 100 %
Pre FEV1/FVC ratio: 83 %
Pre FEV6/FVC Ratio: 100 %

## 2022-11-18 MED ORDER — ALBUTEROL SULFATE HFA 108 (90 BASE) MCG/ACT IN AERS: 1.0000 | INHALATION_SPRAY | RESPIRATORY_TRACT | 2 refills | Status: AC | PRN

## 2022-11-18 NOTE — Progress Notes (Signed)
Pre/Post Spirometry Performed Today. 

## 2022-11-18 NOTE — Telephone Encounter (Signed)
Hi, could I receive help for test scripts for the cheapest ICS/LABA for this patient?

## 2022-11-18 NOTE — Progress Notes (Unsigned)
Subjective:   PATIENT ID: Christy Poole GENDER: female DOB: 08-31-1961, MRN: [id number]  Chief Complaint  Patient presents with   Consult    Sob and chest tightness     Reason for Visit: New consult for abnormal CT and shortness of breath  [name] is a 62 year old never smoker with stroke, seizures, DM2 and atrial fibrillation who presents for evaluation for abnormal CT and shortness of breath.  She reports shortness of breath since 2018 after her stroke. In the last week she has noticed worsening shortness of breath, chest tightness and wheezing. Associated with unproductive cough sometimes. Does not uses inhalers. Sometimes has chest tightness when laying down in the last year. Denies personal history of asthma. Mother had breathing issues. Denies weight change (5lbs over 1 year), night sweats or unexplained fevers/chills.  Social History: Never smoker   Environmental exposures: Denies  I have personally reviewed patient's past medical/family/social history, allergies, current medications.  Past Medical History:  Diagnosis Date   Atrial fibrillation ([place])    Diabetes mellitus without complication ([place])    Stroke [facility])    Thyroid disease      History reviewed. No pertinent family history.   Social History   Occupational History   Not on file  Tobacco Use   Smoking status: Never   Smokeless tobacco: Never  Vaping Use   Vaping Use: Never used  Substance and Sexual Activity   Alcohol use: Never   Drug use: Never   Sexual activity: Never    No Known Allergies   Outpatient Medications Prior to Visit  Medication Sig Dispense Refill   acetaminophen (TYLENOL) 500 MG tablet Take 1,000 mg by mouth every 6 (six) hours as needed for mild pain, headache or fever.     aspirin EC 81 MG tablet Take 1 tablet (81 mg total) by mouth daily. (swallow whole) 90 tablet 1   atorvastatin (LIPITOR) 80 MG tablet Take 1 tablet (80 mg total) by mouth daily. 90 tablet 1    levETIRAcetam (KEPPRA) 250 MG tablet Take 1 tablet (250 mg total) by mouth 2 (two) times daily. 60 tablet 3   levothyroxine (SYNTHROID) 50 MCG tablet Take 1 tablet (50 mcg total) by mouth daily before breakfast. Take medication on its own on an empty stomach 90 tablet 0   metoprolol succinate (TOPROL-XL) 50 MG 24 hr tablet Take 1.5 tablets (75 mg total) by mouth daily. 135 tablet 3   warfarin (COUMADIN) 2.5 MG tablet TAKE 1-2 TABLETS DAILY OR AS PRESCRIBED BY COUMADIN CLINIC 60 tablet 0   No facility-administered medications prior to visit.    Review of Systems  Constitutional:  Negative for chills, diaphoresis, fever, malaise/fatigue and weight loss.  HENT:  Negative for congestion.   Respiratory:  Positive for shortness of breath and wheezing. Negative for cough, hemoptysis and sputum production.   Cardiovascular:  Negative for chest pain, palpitations and leg swelling.     Objective:   Vitals:   11/18/22 1122  BP: 130/64  Pulse: 78  SpO2: 98%  Weight: 112 lb (50.8 kg)  Height: 4' 9.28" (1.455 m)   SpO2: 98 % O2 Device: None (Room air)  Physical Exam: General: Well-appearing, no acute distress HENT: [place], AT Eyes: EOMI, no scleral icterus Respiratory: Clear to auscultation bilaterally.  No crackles, wheezing or rales Cardiovascular: RRR, -M/R/G, no JVD Extremities:-Edema,-tenderness Neuro: AAO x4, CNII-XII grossly intact Psych: Normal mood, normal affect  Data Reviewed:  Imaging: CT Chest 09/22/23 -  1.3  PFT: 11/18/22 FVC 0.94 (38%) FEV1 0.84 (44%) Ratio 83  Interpretation: Reduced FVC and FEV1. Normal ration. Will need lung volume testing to confirm restrictive defect  Labs: CBC    Component Value Date/Time   WBC 6.0 11/10/2022 0912   WBC 6.5 12/15/2021 0749   RBC 4.73 11/10/2022 0912   RBC 4.91 12/15/2021 0749   HGB 13.9 11/10/2022 0912   HCT 41.3 11/10/2022 0912   PLT 171 11/10/2022 0912   MCV 87 11/10/2022 0912   MCH 29.4 11/10/2022 0912   MCH 28.7  12/15/2021 0749   MCHC 33.7 11/10/2022 0912   MCHC 32.8 12/15/2021 0749   RDW 12.6 11/10/2022 0912   LYMPHSABS 1.5 11/10/2022 0912   MONOABS 0.4 12/15/2021 0749   EOSABS 0.2 11/10/2022 0912   BASOSABS 0.1 11/10/2022 0912        Assessment & Plan:   Discussion: 62 year old never smoker with stroke, seizures, DM2 and atrial fibrillation who presents for evaluation for abnormal CT and shortness of breath.  Abnormal CT --ORDER PET/CT   Shortness of breath --ORDER pulmonary function tests  Health Maintenance Immunization History  Administered Date(s) Administered   Influenza,inj,Quad PF,6+ Mos 11/10/2022   CT Lung Screen - never smoker. Not qualified  Orders Placed This Encounter  Procedures   NM PET Image Initial (PI) Skull Base To Thigh    Standing Status:   Future    Standing Expiration Date:   11/19/2023    Order Specific Question:   If indicated for the ordered procedure, I authorize the administration of a radiopharmaceutical per Radiology protocol    Answer:   Yes    Order Specific Question:   Preferred imaging location?    Answer:   [facility]   Pulmonary function test    Before and after    Standing Status:   Future    Number of Occurrences:   1    Standing Expiration Date:   11/19/2023    Order Specific Question:   Where should this test be performed?    Answer:   [facility]   Meds ordered this encounter  Medications   albuterol (VENTOLIN HFA) 108 (90 Base) MCG/ACT inhaler    Sig: Inhale 1-2 puffs into the lungs every 4 (four) hours as needed for wheezing or shortness of breath.    Dispense:  1 each    Refill:  2    Return in about 1 month (around 12/19/2022).  I have spent a total time of 45-minutes on the day of the appointment reviewing prior documentation, coordinating care and discussing medical diagnosis and plan with the patient/family. Imaging, labs and tests included in this note have been reviewed and interpreted independently by me.  [place], MD [facility] 11/18/2022 11:28 AM  Office Number [phone]

## 2022-11-18 NOTE — Patient Instructions (Signed)
Abnormal CT --ORDER PET/CT   Shortness of breath --ORDER pulmonary function tests (before and after)  Follow-up with me in 1 month

## 2022-11-18 NOTE — Patient Instructions (Signed)
Pre/Post Spirometry Performed Today. 

## 2022-11-21 ENCOUNTER — Other Ambulatory Visit (HOSPITAL_COMMUNITY): Payer: Self-pay

## 2022-11-21 NOTE — Telephone Encounter (Signed)
Covered ICS/LABA for this patient at this time are Dulera, Advair HFA, Brand Advair Diskus, and Brand Symbicort HFA- all at a $4.00 co-pay at this time.

## 2022-11-24 ENCOUNTER — Telehealth (HOSPITAL_BASED_OUTPATIENT_CLINIC_OR_DEPARTMENT_OTHER): Payer: Self-pay | Admitting: Family Medicine

## 2022-11-24 ENCOUNTER — Encounter (HOSPITAL_BASED_OUTPATIENT_CLINIC_OR_DEPARTMENT_OTHER): Payer: Self-pay | Admitting: Pulmonary Disease

## 2022-11-24 ENCOUNTER — Telehealth (HOSPITAL_BASED_OUTPATIENT_CLINIC_OR_DEPARTMENT_OTHER): Payer: Self-pay | Admitting: Pulmonary Disease

## 2022-11-24 MED ORDER — MOMETASONE FURO-FORMOTEROL FUM 100-5 MCG/ACT IN AERO: 2.0000 | INHALATION_SPRAY | Freq: Two times a day (BID) | RESPIRATORY_TRACT | 2 refills | Status: DC

## 2022-11-24 NOTE — Telephone Encounter (Signed)
I spoke with son on phone. Message sent to [facility] to investigate.

## 2022-11-24 NOTE — Telephone Encounter (Signed)
Please advise 

## 2022-11-24 NOTE — Telephone Encounter (Signed)
Pts son came by on 11/24/22 gave [facility] paperwork for Request for Independent Assessment for Personal Care Services Attention of Medical Need. Will give to fill in provider FNP Santiago Glad since dr. [place] is on leave. Might need an appointment if FNP needs to discuss further with pt. Please advise.

## 2022-11-24 NOTE — Telephone Encounter (Signed)
[facility] Telephone Encounter  PFT 11/18/22 FVC 0.94 (38%) FEV1 0.84 (44%) Ratio 83. Partial bronchodilator response in FEV1 Interpretation: No obstructive defect with partial but not significant bronchodilator response. Reduced FVC and FEV1. Consider lung volume testing to confirm.  Communicated results to patient's son (DPR). Even though there was no obstructive defect, she did have partial response to albuterol trial. Will prescribe Dulera 100 BID.  During conversation son reported PET was denied. Will further investigate.

## 2022-11-25 NOTE — Telephone Encounter (Signed)
I let family know that our office verified PET/CT was pending authorization and note denied however son states he was contacted by "radiology" and told it was denied. I replied that I would keep him updated.

## 2022-11-28 ENCOUNTER — Encounter (HOSPITAL_COMMUNITY): Payer: Medicaid Other

## 2022-11-30 NOTE — Telephone Encounter (Signed)
I did not receive any documentation for the denial until this morning.  I have added additional documentation to hopefully get approved & this is marked as under review by the insurance.  Let me know if you would like to complete a peer to peer in addition to this.

## 2022-12-02 ENCOUNTER — Ambulatory Visit: Payer: Medicaid Other | Attending: Cardiovascular Disease

## 2022-12-02 ENCOUNTER — Other Ambulatory Visit (HOSPITAL_COMMUNITY): Payer: Self-pay

## 2022-12-02 DIAGNOSIS — Z8673 Personal history of transient ischemic attack (TIA), and cerebral infarction without residual deficits: Secondary | ICD-10-CM | POA: Diagnosis not present

## 2022-12-02 DIAGNOSIS — Z7901 Long term (current) use of anticoagulants: Secondary | ICD-10-CM | POA: Diagnosis not present

## 2022-12-02 DIAGNOSIS — I4821 Permanent atrial fibrillation: Secondary | ICD-10-CM

## 2022-12-02 LAB — POCT INR: INR: 3.5 — AB (ref 2.0–3.0)

## 2022-12-02 NOTE — Patient Instructions (Signed)
Description   Skip today's dosage of Warfarin, then resume same dosage of warfarin 2 tablets daily EXCEPT for 1 tablet on Mondays and Fridays.   Recheck INR in 3 weeks. Stay consistent with 1 serving of greens per week.  Coumadin Clinic [phone] or [phone] (Goal 2.5-3.0 per Discharge Summary on 12/08/21)

## 2022-12-09 ENCOUNTER — Encounter (HOSPITAL_COMMUNITY)
Admission: RE | Admit: 2022-12-09 | Discharge: 2022-12-09 | Disposition: A | Discharge status: Home / Self Care | Payer: Medicaid Other | Source: Ambulatory Visit | Attending: Pulmonary Disease | Admitting: Pulmonary Disease

## 2022-12-09 DIAGNOSIS — R599 Enlarged lymph nodes, unspecified: Secondary | ICD-10-CM | POA: Diagnosis present

## 2022-12-09 LAB — GLUCOSE, CAPILLARY: Glucose-Capillary: 95 mg/dL (ref 70–99)

## 2022-12-09 MED ORDER — FLUDEOXYGLUCOSE F - 18 (FDG) INJECTION
6.0000 | Freq: Once | INTRAVENOUS | Status: AC | PRN
  Administered 2022-12-09: 5.5 via INTRAVENOUS

## 2022-12-19 ENCOUNTER — Other Ambulatory Visit (HOSPITAL_BASED_OUTPATIENT_CLINIC_OR_DEPARTMENT_OTHER): Payer: Self-pay | Admitting: Cardiology

## 2022-12-19 ENCOUNTER — Other Ambulatory Visit (HOSPITAL_BASED_OUTPATIENT_CLINIC_OR_DEPARTMENT_OTHER): Payer: Self-pay | Admitting: Family Medicine

## 2022-12-19 DIAGNOSIS — I4821 Permanent atrial fibrillation: Secondary | ICD-10-CM

## 2022-12-19 DIAGNOSIS — I050 Rheumatic mitral stenosis: Secondary | ICD-10-CM

## 2022-12-19 NOTE — Telephone Encounter (Signed)
Prescription refill request received for warfarin Lov:  07/06/22 [facility] Gave)  Next INR check: 12/23/22 Warfarin tablet strength: 2.'5mg'$   Appropriate dose. Refill sent.

## 2022-12-19 NOTE — Telephone Encounter (Signed)
Please review for refill. Thank you! 

## 2022-12-19 NOTE — Telephone Encounter (Signed)
Rx request sent to pharmacy.  

## 2022-12-21 ENCOUNTER — Ambulatory Visit (HOSPITAL_BASED_OUTPATIENT_CLINIC_OR_DEPARTMENT_OTHER): Payer: Medicaid Other | Admitting: Pulmonary Disease

## 2022-12-21 NOTE — Progress Notes (Deleted)
Subjective:   PATIENT ID: Christy Poole GENDER: female DOB: [birth year], MRN: [id number]  No chief complaint on file.   Reason for Visit: Follow-up  Christy Poole is a 62 year old never smoker with stroke, seizures, DM2 and atrial fibrillation who presents for evaluation for abnormal CT and shortness of breath.  She reports shortness of breath since 2018 after her stroke. In the last week she has noticed worsening shortness of breath, chest tightness and wheezing. Associated with unproductive cough sometimes. Does not uses inhalers. Sometimes has chest tightness when laying down in the last year. Denies personal history of asthma. Mother had breathing issues. Denies weight change (5lbs over 1 year), night sweats or unexplained fevers/chills.  Social History: Never smoker   Environmental exposures: Denies  I have personally reviewed patient's past medical/family/social history, allergies, current medications.  Past Medical History:  Diagnosis Date   Atrial fibrillation ([place])    Diabetes mellitus without complication ([place])    Stroke [facility])    Thyroid disease      No family history on file.   Social History   Occupational History   Not on file  Tobacco Use   Smoking status: Never   Smokeless tobacco: Never  Vaping Use   Vaping Use: Never used  Substance and Sexual Activity   Alcohol use: Never   Drug use: Never   Sexual activity: Never    No Known Allergies   Outpatient Medications Prior to Visit  Medication Sig Dispense Refill   warfarin (COUMADIN) 2.5 MG tablet TAKE 1 TABLET TO 2 TABLETS DAILY OR AS PRESCRIBED BY COUMADIN CLINIC 60 tablet 2   acetaminophen (TYLENOL) 500 MG tablet Take 1,000 mg by mouth every 6 (six) hours as needed for mild pain, headache or fever.     albuterol (VENTOLIN HFA) 108 (90 Base) MCG/ACT inhaler Inhale 1-2 puffs into the lungs every 4 (four) hours as needed for wheezing or shortness of breath. 1 each 2   ASPIRIN LOW DOSE 81 MG  tablet TAKE 1 TABLET (81 MG TOTAL) BY MOUTH DAILY. (SWALLOW WHOLE) 90 tablet 1   atorvastatin (LIPITOR) 80 MG tablet TAKE 1 TABLET (80 MG TOTAL) BY MOUTH DAILY. 90 tablet 1   levETIRAcetam (KEPPRA) 250 MG tablet TAKE 1 TABLET (250 MG TOTAL) BY MOUTH 2 (TWO) TIMES DAILY. 60 tablet 3   levothyroxine (SYNTHROID) 50 MCG tablet TAKE 1 TABLET (50 MCG TOTAL) BY MOUTH AT BEDTIME. 90 tablet 0   metoprolol succinate (TOPROL-XL) 50 MG 24 hr tablet Take 1.5 tablets (75 mg total) by mouth daily. 135 tablet 3   mometasone-formoterol (DULERA) 100-5 MCG/ACT AERO Inhale 2 puffs into the lungs in the morning and at bedtime. 1 each 2   No facility-administered medications prior to visit.    Review of Systems  Constitutional:  Negative for chills, diaphoresis, fever, malaise/fatigue and weight loss.  HENT:  Negative for congestion.   Respiratory:  Positive for shortness of breath and wheezing. Negative for cough, hemoptysis and sputum production.   Cardiovascular:  Negative for chest pain, palpitations and leg swelling.     Objective:   There were no vitals filed for this visit.     Physical Exam: General: Well-appearing, no acute distress HENT: [place], AT Eyes: EOMI, no scleral icterus Respiratory: Clear to auscultation bilaterally.  No crackles, wheezing or rales Cardiovascular: RRR, -M/R/G, no JVD Extremities:-Edema,-tenderness Neuro: AAO x4, CNII-XII grossly intact Psych: Normal mood, normal affect  Data Reviewed:  Imaging: CT Chest 09/22/23 -  1.3  PFT: 11/18/22 FVC 0.94 (38%) FEV1 0.84 (44%) Ratio 83  Interpretation: Reduced FVC and FEV1. Normal ration. Will need lung volume testing to confirm restrictive defect  Labs: CBC    Component Value Date/Time   WBC 6.0 11/10/2022 0912   WBC 6.5 12/15/2021 0749   RBC 4.73 11/10/2022 0912   RBC 4.91 12/15/2021 0749   HGB 13.9 11/10/2022 0912   HCT 41.3 11/10/2022 0912   PLT 171 11/10/2022 0912   MCV 87 11/10/2022 0912   MCH 29.4 11/10/2022  0912   MCH 28.7 12/15/2021 0749   MCHC 33.7 11/10/2022 0912   MCHC 32.8 12/15/2021 0749   RDW 12.6 11/10/2022 0912   LYMPHSABS 1.5 11/10/2022 0912   MONOABS 0.4 12/15/2021 0749   EOSABS 0.2 11/10/2022 0912   BASOSABS 0.1 11/10/2022 0912        Assessment & Plan:   Discussion: 62 year old never smoker with stroke, seizures, DM2 and atrial fibrillation who presents for evaluation for abnormal CT and shortness of breath. Discussed CT scan at length regarding stable lymph node and needing further evaluation with PET/CT. Reviewed history regarding shortness of breath and will obtain PFTs to rule out underlying obstructive lung disease.  Abnormal CT --ORDER PET/CT   Shortness of breath --ORDER pulmonary function tests  Health Maintenance Immunization History  Administered Date(s) Administered   Influenza,inj,Quad PF,6+ Mos 11/10/2022   CT Lung Screen - never smoker. Not qualified  No orders of the defined types were placed in this encounter.  No orders of the defined types were placed in this encounter.   No follow-ups on file.  I have spent a total time of 45-minutes on the day of the appointment reviewing prior documentation, coordinating care and discussing medical diagnosis and plan with the patient/family. Imaging, labs and tests included in this note have been reviewed and interpreted independently by me.  [place], MD [facility] 12/21/2022 8:20 AM  Office Number [phone]

## 2022-12-22 ENCOUNTER — Ambulatory Visit (INDEPENDENT_AMBULATORY_CARE_PROVIDER_SITE_OTHER): Payer: Medicaid Other | Admitting: Pulmonary Disease

## 2022-12-22 ENCOUNTER — Encounter (HOSPITAL_BASED_OUTPATIENT_CLINIC_OR_DEPARTMENT_OTHER): Payer: Self-pay | Admitting: Pulmonary Disease

## 2022-12-22 VITALS — BP 112/80 | HR 56 | Ht 59.0 in | Wt 110.6 lb

## 2022-12-22 DIAGNOSIS — R590 Localized enlarged lymph nodes: Secondary | ICD-10-CM

## 2022-12-22 NOTE — Patient Instructions (Signed)
Mediastinal lymphadenopathy 13 mm hypervascular AP window lesion shows no hypermetabolism on PET --ORDER CT Chest with contrast  Shortness of breath --START Dulera 100 mcg TWO puffs in the morning and evening --Encourage regular aerobic activity  Follow-up with me in 3 months for inhaler management

## 2022-12-22 NOTE — Progress Notes (Signed)
Subjective:   PATIENT ID: Christy Poole GENDER: female DOB: 22-Dec-1960, MRN: [id number]  Chief Complaint  Patient presents with   Follow-up    Uncomfortable with using dulera     Reason for Visit: Follow-up  [name] is a 62 year old never smoker with stroke, seizures, DM2 and atrial fibrillation who presents for follow-up abnormal CT and shortness of breath.  She reports shortness of breath since 2018 after her stroke. In the last week she has noticed worsening shortness of breath, chest tightness and wheezing. Associated with unproductive cough sometimes. Does not uses inhalers. Sometimes has chest tightness when laying down in the last year. Denies personal history of asthma. Mother had breathing issues. Denies weight change (5lbs over 1 year), night sweats or unexplained fevers/chills.  12/22/22 Son present to provide additional history. Patient completed PFTs and discussed results on phone however has not started [facility] since last visit. Continues to have unchanged symptoms of shortness of breath, cough, chest tightness and wheezing.  Social History: Never smoker   Environmental exposures: Denies  Past Medical History:  Diagnosis Date   Atrial fibrillation ([place])    Diabetes mellitus without complication ([place])    Stroke ([place])    Thyroid disease      History reviewed. No pertinent family history.   Social History   Occupational History   Not on file  Tobacco Use   Smoking status: Never   Smokeless tobacco: Never  Vaping Use   Vaping Use: Never used  Substance and Sexual Activity   Alcohol use: Never   Drug use: Never   Sexual activity: Never    No Known Allergies   Outpatient Medications Prior to Visit  Medication Sig Dispense Refill   acetaminophen (TYLENOL) 500 MG tablet Take 1,000 mg by mouth every 6 (six) hours as needed for mild pain, headache or fever.     ASPIRIN LOW DOSE 81 MG tablet TAKE 1 TABLET (81 MG TOTAL) BY MOUTH DAILY. (SWALLOW  WHOLE) 90 tablet 1   atorvastatin (LIPITOR) 80 MG tablet TAKE 1 TABLET (80 MG TOTAL) BY MOUTH DAILY. 90 tablet 1   levETIRAcetam (KEPPRA) 250 MG tablet TAKE 1 TABLET (250 MG TOTAL) BY MOUTH 2 (TWO) TIMES DAILY. 60 tablet 3   levothyroxine (SYNTHROID) 50 MCG tablet TAKE 1 TABLET (50 MCG TOTAL) BY MOUTH AT BEDTIME. 90 tablet 0   metoprolol succinate (TOPROL-XL) 50 MG 24 hr tablet Take 1.5 tablets (75 mg total) by mouth daily. 135 tablet 3   warfarin (COUMADIN) 2.5 MG tablet TAKE 1 TABLET TO 2 TABLETS DAILY OR AS PRESCRIBED BY COUMADIN CLINIC 60 tablet 2   albuterol (VENTOLIN HFA) 108 (90 Base) MCG/ACT inhaler Inhale 1-2 puffs into the lungs every 4 (four) hours as needed for wheezing or shortness of breath. 1 each 2   mometasone-formoterol (DULERA) 100-5 MCG/ACT AERO Inhale 2 puffs into the lungs in the morning and at bedtime. (Patient not taking: Reported on 12/22/2022) 1 each 2   No facility-administered medications prior to visit.    Review of Systems  Constitutional:  Negative for chills, diaphoresis, fever, malaise/fatigue and weight loss.  HENT:  Negative for congestion.   Respiratory:  Positive for sputum production, shortness of breath and wheezing. Negative for cough and hemoptysis.   Cardiovascular:  Negative for chest pain, palpitations and leg swelling.     Objective:   Vitals:   12/22/22 0952  BP: 112/80  Pulse: (!) 56  SpO2: 96%  Weight: 110 lb  9 oz (50.1 kg)  Height: '4\' 11"'$  (1.499 m)    SpO2: 96 % O2 Device: None (Room air)  Physical Exam: General: Well-appearing, no acute distress HENT: [place], AT Eyes: EOMI, no scleral icterus Respiratory: Clear to auscultation bilaterally.  No crackles, wheezing or rales Cardiovascular: RRR, -M/R/G, no JVD Extremities:-Edema,-tenderness Neuro: AAO x4, CNII-XII grossly intact Psych: Normal mood, normal affect  Data Reviewed:  Imaging: CT Chest 09/22/23 - 1.3 x 11 rounded lesion in upper AP window. Unchanged compared to  12/09/21. Mosaic attenuation of lungs PET 12/09/22 - 1.3 cm AP window lesion with no hypermetabolism. Borderline mediastinal and left hilar lymph nodes with low level hypermetabolism  PFT: 11/18/22 FVC 0.94 (38%) FEV1 0.84 (44%) Ratio 83  Interpretation: Reduced FVC and FEV1. Normal ratio. Will need lung volume testing to confirm restrictive defect  Labs: CBC    Component Value Date/Time   WBC 6.0 11/10/2022 0912   WBC 6.5 12/15/2021 0749   RBC 4.73 11/10/2022 0912   RBC 4.91 12/15/2021 0749   HGB 13.9 11/10/2022 0912   HCT 41.3 11/10/2022 0912   PLT 171 11/10/2022 0912   MCV 87 11/10/2022 0912   MCH 29.4 11/10/2022 0912   MCH 28.7 12/15/2021 0749   MCHC 33.7 11/10/2022 0912   MCHC 32.8 12/15/2021 0749   RDW 12.6 11/10/2022 0912   LYMPHSABS 1.5 11/10/2022 0912   MONOABS 0.4 12/15/2021 0749   EOSABS 0.2 11/10/2022 0912   BASOSABS 0.1 11/10/2022 0912        Assessment & Plan:   Discussion: 62 year old never smoker with stroke, seizures, DM2 and atrial fibrillation who presents for follow-up abnormal CT and shortness of breath. Discussed CT scan at length regarding stable lymph node. PET/CT with no avidity in concerned lesion with mild activity in mediastinal and left hilar lymph node. Recommend serial surveillance for stability, low suspicion for malignancy at this time that would warrant invasive diagnostic evaluation.   Discussed bronchodilator use. Patient did not bring inhaler in office today. Discussed inhaler technique and recommended continued teaching with pharmacy for in-person demonstration.  Mediastinal lymphadenopathy 13 mm hypervascular AP window lesion shows no hypermetabolism on PET --ORDER CT Chest with contrast in 6 months for August 2024  Shortness of breath --START Dulera 100 mcg TWO puffs in the morning and evening --Encourage regular aerobic activity  Health Maintenance Immunization History  Administered Date(s) Administered   Influenza,inj,Quad  PF,6+ Mos 11/10/2022   CT Lung Screen - never smoker. Not qualified  Orders Placed This Encounter  Procedures   CT Chest W Contrast    Schedule in 6 months (aug 2024)    Standing Status:   Future    Standing Expiration Date:   12/22/2023    Order Specific Question:   If indicated for the ordered procedure, I authorize the administration of contrast media per Radiology protocol    Answer:   Yes    Order Specific Question:   Does the patient have a contrast media/X-ray dye allergy?    Answer:   No    Order Specific Question:   Preferred imaging location?    Answer:   [facility]   Ambulatory referral to Social Work    Referral Priority:   Routine    Referral Type:   Consultation    Referral Reason:   Specialty Services Required    Number of Visits Requested:   1   No orders of the defined types were placed in this encounter.   No follow-ups  on file.  I have spent a total time of 36-minutes on the day of the appointment including chart review, data review, collecting history, coordinating care and discussing medical diagnosis and plan with the patient/family. Past medical history, allergies, medications were reviewed. Pertinent imaging, labs and tests included in this note have been reviewed and interpreted independently by me.  [place], MD [facility] 12/22/2022 12:19 PM  Office Number [phone]

## 2022-12-23 ENCOUNTER — Ambulatory Visit: Payer: Medicaid Other | Attending: Cardiovascular Disease | Admitting: *Deleted

## 2022-12-23 DIAGNOSIS — Z7901 Long term (current) use of anticoagulants: Secondary | ICD-10-CM

## 2022-12-23 DIAGNOSIS — Z8673 Personal history of transient ischemic attack (TIA), and cerebral infarction without residual deficits: Secondary | ICD-10-CM

## 2022-12-23 DIAGNOSIS — I4821 Permanent atrial fibrillation: Secondary | ICD-10-CM | POA: Diagnosis not present

## 2022-12-23 DIAGNOSIS — I050 Rheumatic mitral stenosis: Secondary | ICD-10-CM

## 2022-12-23 LAB — POCT INR: POC INR: 1

## 2022-12-23 MED ORDER — WARFARIN SODIUM 2.5 MG PO TABS: ORAL_TABLET | ORAL | 2 refills | Status: DC

## 2022-12-23 NOTE — Patient Instructions (Addendum)
Description   Take 3 tablets of warfarin today and then continue to take warfarin 2 tablets daily except for 1 tablet on Monday and Friday. Recheck INR in 1.5 weeks. [facility]madin Clinic [phone] (Goal 2.5-3.0 per Discharge Summary on 12/08/21)

## 2023-01-04 ENCOUNTER — Encounter (HOSPITAL_BASED_OUTPATIENT_CLINIC_OR_DEPARTMENT_OTHER): Payer: Self-pay

## 2023-01-04 ENCOUNTER — Ambulatory Visit: Payer: Medicaid Other | Attending: Cardiovascular Disease

## 2023-01-04 DIAGNOSIS — Z8673 Personal history of transient ischemic attack (TIA), and cerebral infarction without residual deficits: Secondary | ICD-10-CM

## 2023-01-04 DIAGNOSIS — Z7901 Long term (current) use of anticoagulants: Secondary | ICD-10-CM

## 2023-01-04 DIAGNOSIS — I4821 Permanent atrial fibrillation: Secondary | ICD-10-CM | POA: Diagnosis not present

## 2023-01-04 LAB — POCT INR: INR: 2.2 (ref 2.0–3.0)

## 2023-01-04 NOTE — Patient Instructions (Signed)
Description   Take 2.5 tablets of warfarin today and then continue to take warfarin 2 tablets daily except for 1 tablet on Monday and Friday.  Recheck INR in 2 weeks.  Coumadin Clinic [phone] (Goal 2.5-3.0 per Discharge Summary on 12/08/21)

## 2023-01-05 ENCOUNTER — Ambulatory Visit (HOSPITAL_BASED_OUTPATIENT_CLINIC_OR_DEPARTMENT_OTHER): Payer: 59 | Admitting: Cardiology

## 2023-01-10 ENCOUNTER — Encounter: Payer: Self-pay | Admitting: Neurology

## 2023-01-10 ENCOUNTER — Ambulatory Visit: Payer: Medicaid Other | Admitting: Neurology

## 2023-01-10 VITALS — BP 127/71 | HR 88 | Ht 59.0 in | Wt 110.0 lb

## 2023-01-10 DIAGNOSIS — R569 Unspecified convulsions: Secondary | ICD-10-CM | POA: Diagnosis not present

## 2023-01-10 DIAGNOSIS — Z8673 Personal history of transient ischemic attack (TIA), and cerebral infarction without residual deficits: Secondary | ICD-10-CM | POA: Diagnosis not present

## 2023-01-10 MED ORDER — LEVETIRACETAM 250 MG PO TABS: 250.0000 mg | ORAL_TABLET | Freq: Two times a day (BID) | ORAL | 3 refills | Status: DC

## 2023-01-10 NOTE — Progress Notes (Signed)
[facility] [address] [place]. [place] [address]       HOSPITAL FOLLOW UP NOTE  Ms. SHELVIE DURAZO Date of Birth:  30-Dec-1960 Medical Record Number:  [id number]   Reason for Referral:  hospital stroke follow up    SUBJECTIVE:   CHIEF COMPLAINT:  Chief Complaint  Patient presents with   Follow-up    Rm 28 with son, seizure last 10/15/2022    HPI:  Update 01/10/2023 : She returns for follow-up after last visit 4 months ago.  She is accompanied by her son who provides most of the history and translate for her.  Patient has done well after starting Keppra and has not had any further episodes of staring or altered consciousness since December 2023.  She is tolerating Keppra well without side effects.  She has had no recurrent stroke or TIA symptoms.  She remains on warfarin and does bruise easily and at times forgets to take her medications.  Her INR has been quite fluctuating.  She complains of of intermittent mild headaches as well as she is having some trouble reading.  She has not yet seen an ophthalmologist for evaluation for cataracts.  She had EEG on 11 /15/2023 which was normal..  Hemoglobin A1c was 6.4.  10/29/2011/2024 was normal. Update 08/24/2022 : Patient is seen for follow-up today after last visit with Janett Billow NP .She is accompanied by her son.   They inform us about a new complaint today that she has had multiple episodes of brief alteredLoses consciousness and some of them and tries to follow normal heart itself.  Most of the episodes he has awakened looking at her eyes and is staring and has some stiffness of the extremities this lasted about 5 to 10 minutes he is quite tired after that.  She has not been evaluated for seizures yet.  Most of these episodes occurred at night but will buckle during the day as well.  She was seen in the hospital in February for 2  episodes 1 week apart that she had lost consciousness and subsequently has noted some increased  left-sided weakness.  MRI scan of the brain on 12/09/2021 had shown old right MCA infarct but no acute abnormalities.  CT angiogram neck showing no large vessel stenosis or occlusion repeat CT scan of the head on 12/15/2021 had also shown no acute abnormalities.  EEG was not ordered.  Patient was diagnosed with rheumatic mitral stenosis and 2017 subsequently had atrial fibrillation.  He was not on anticoagulation until 2019 when she had a large right MCA stroke.  He is tolerating warfarin well with INR which has been fairly stable.  She denies bruising bleeding or other side effects.  Prior Visit 02/06/22 Janett Billow nurse practitioner )Ms. Daxa HALEYANN WOODFORK is a 62 y.o. 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  Noted symptoms wax and wane.  Personally reviewed hospitalization pertinent progress notes, lab work and imaging.  Evaluated by Dr. Erlinda Hong for likely right brain TIA likely due to A-fib on Coumadin with lower and INR level. CTH and MR brain negative for acute stroke, noted chronic moderate size cortical/subcortical right MCA territory infarct.  CTA head/neck unremarkable.  EF 45 to 50%.  LDL 36.  A1c 6.6.  Recommend continuation of warfarin and aspirin with  increased INR goal at 2.5-3.0.  Continuation of atorvastatin 80 mg daily.  She was discharged home with recommended home health PT/OT.  Return to ED 2/22 with recurrence of left facial and left arm weakness lasting 5 to 10 minutes then resolved.  Repeat CT head no acute findings.  Diagnosed with TIA.  No further work-up completed as recently completed for similar symptoms.  First passing out, about 25 min after symptoms started Similar to second time    Today, 01/27/2022, patient being seen for hospital follow up  accompanied by her son who assists with translation and providing majority of history. Overall stable from stroke standpoint.  Denies new or reoccurring stroke/TIA symptoms.  Continued left-sided weakness and short-term memory difficulties but at baseline. Continues to work with [facility] PT. Ambulates without AD, no recent falls.  Son mentions syncopal episodes prior to her stroke in 2019 and about 25 minutes prior to recent TIA events. No additional syncopal events since that time. Also mentions headaches over the past 6-7 months, present on both sides of head and on top in pressure/band type sensation. Denies photophobia, phonophobia or N/V. Will take tylenol with resolution. Lives with son and daughter-in-law who assists with ADLs and majority of IADLs.  Remains on aspirin and warfarin, denies side effects.  Routinely monitored by Coumadin clinic and has since establish care with cardiologist Dr. Harrell Gave.  Remains on atorvastatin, denies side effects.  Blood pressure today 131/74.  No further concerns at this time.       PERTINENT IMAGING  Per hospitalization 12/08/2021 CT no acute abnormality MRI no acute infarct, chronic moderate-sized cortical/subcortical right MCA territory infarct CT head and neck unremarkable 2D Echo EF 45 to 50% LDL 36 HgbA1c 6.6    ROS:   14 system review of systems performed and negative with exception of those listed in HPI  PMH:  Past Medical History:  Diagnosis Date   Atrial fibrillation ([place])    Diabetes mellitus without complication ([place])    Stroke ([place])    Thyroid disease     PSH: No past surgical history on file.  Social History:  Social History   Socioeconomic History   Marital status: Widowed    Spouse name: Not on file   Number of children: Not on file   Years of education: Not on file   Highest education level: Not on file  Occupational History   Not on file  Tobacco Use   Smoking status: Never   Smokeless tobacco: Never  Vaping Use    Vaping Use: Never used  Substance and Sexual Activity   Alcohol use: Never   Drug use: Never   Sexual activity: Never  Other Topics Concern   Not on file  Social History Narrative   Not on file   Social Determinants of Health   Financial Resource Strain: High Risk (04/06/2022)   Overall Financial Resource Strain (CARDIA)    Difficulty of Paying Living Expenses: Very hard  Food Insecurity: Food Insecurity Present (06/02/2022)   Hunger Vital Sign    Worried About [place] in the Last Year: Sometimes true    Ran Out of Food in the Last Year: Sometimes true  Transportation Needs: No Transportation Needs (06/02/2022)   PRAPARE - Hydrologist (Medical): No    Lack of Transportation (Non-Medical): No  Physical Activity: Not on file  Stress: Not on file  Social Connections: Not on file  Intimate Partner Violence: Not on file  Family History: No family history on file.  Medications:   Current Outpatient Medications on File Prior to Visit  Medication Sig Dispense Refill   acetaminophen (TYLENOL) 500 MG tablet Take 1,000 mg by mouth every 6 (six) hours as needed for mild pain, headache or fever.     albuterol (VENTOLIN HFA) 108 (90 Base) MCG/ACT inhaler Inhale 1-2 puffs into the lungs every 4 (four) hours as needed for wheezing or shortness of breath. 1 each 2   ASPIRIN LOW DOSE 81 MG tablet TAKE 1 TABLET (81 MG TOTAL) BY MOUTH DAILY. (SWALLOW WHOLE) 90 tablet 1   atorvastatin (LIPITOR) 80 MG tablet TAKE 1 TABLET (80 MG TOTAL) BY MOUTH DAILY. 90 tablet 1   levothyroxine (SYNTHROID) 50 MCG tablet TAKE 1 TABLET (50 MCG TOTAL) BY MOUTH AT BEDTIME. 90 tablet 0   metoprolol succinate (TOPROL-XL) 50 MG 24 hr tablet Take 1.5 tablets (75 mg total) by mouth daily. 135 tablet 3   mometasone-formoterol (DULERA) 100-5 MCG/ACT AERO Inhale 2 puffs into the lungs in the morning and at bedtime. 1 each 2   warfarin (COUMADIN) 2.5 MG tablet TAKE 1 TABLET TO 2  TABLETS DAILY OR AS PRESCRIBED BY COUMADIN CLINIC 70 tablet 2   No current facility-administered medications on file prior to visit.    Allergies:  No Known Allergies    OBJECTIVE:  Physical Exam  Vitals:   01/10/23 1532  BP: 127/71  Pulse: 88  Weight: 110 lb (49.9 kg)  Height: 4\' 11"  (1.499 m)   Body mass index is 22.22 kg/m. No results found.  General: Frail petite, pleasant middle aged [place] lady, seated, in no evident distress Head: head normocephalic and atraumatic.   Neck: supple with no carotid or supraclavicular bruits Cardiovascular: irregular rate and rhythm, no murmurs Musculoskeletal: no deformity Skin:  no rash/petichiae Vascular:  Normal pulses all extremities   Neurologic Exam Mental Status: Awake and fully alert. limited to no english - per son, mild dysarthria chronic. Denies aphasia. Oriented to place and time. Recent memory mildly impaired and remote memory intact. Son provides majority of hx due to language barrier. Mood and affect appropriate.  Cranial Nerves: Pupils equal, briskly reactive to light. Extraocular movements full without nystagmus. Visual fields unable to test due to difficulty understanding directions, does blink to threat bilaterally. Hearing intact. Facial sensation intact. Face, tongue, palate moves normally and symmetrically.  Motor: Normal bulk and tone. Normal strength on right side. mild 4/5 left sided weakness chronic diminished fine finger movements on the left.  Mild left grip weakness.  Orbits right over left upper extremity. Sensory.: intact to touch , pinprick , position and vibratory sensation.  Coordination: Rapid alternating movements normal in all extremities except slightly decreased left hand. Finger-to-nose and heel-to-shin performed accurately bilaterally. Gait and Station: Arises from chair without difficulty. Stance is normal. Gait demonstrates normal stride length and with mild imbalance without use of AD.  Unable to complete tandem walk and heel toe.  Reflexes: 1+ and symmetric. Toes downgoing.            ASSESSMENT: Christy Poole is a 62 y.o. year old female right brain TIA on 12/08/2021 and 12/15/2021 likely due to A-fib on Coumadin with lower and INR level. Vascular risk factors include history of right MCA stroke with residual left-sided weakness and facial weakness, hx of rheumatic mitral valve stenosis and A-fib on Coumadin, CAD s/p stent 2016, HLD and new dx of DM.  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  I recommend we continue Keppra  250 mg twice daily as she has not had any episode more than 3 months after starting it.  Continue warfarin for stroke prevention for her valvular atrial fibrillation and maintain aggressive risk factor modification with strict control of hypertension with blood pressure goal below 140/96 with LDL cholesterol goal below 70 mg percent.  She will return for follow-up in the future with me in 6 months or call earlier if necessary. Greater than 50% time during this 35-minute visit was spent on counseling and coordination of care about her remote stroke and results of possible complex partial seizures Antony Contras, MD  [facility] [address] [place], [place] [address]  Phone [phone] Fax [phone] Note: This document was prepared with digital dictation and possible smart phrase technology. Any  transcriptional errors that result from this process are unintentional.

## 2023-01-10 NOTE — Patient Instructions (Signed)
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  I recommend we continue Keppra  250 mg twice daily as she has not had any episode more than 3 months after starting it.  Continue warfarin for stroke prevention for her valvular atrial fibrillation and maintain aggressive risk factor modification with strict control of hypertension with blood pressure goal below 140/96 with LDL cholesterol goal below 70 mg percent.  She will return for follow-up in the future with me in 6 months or call earlier if necessary.

## 2023-01-18 ENCOUNTER — Ambulatory Visit: Payer: Medicaid Other | Attending: Internal Medicine

## 2023-01-18 DIAGNOSIS — I4821 Permanent atrial fibrillation: Secondary | ICD-10-CM | POA: Diagnosis not present

## 2023-01-18 DIAGNOSIS — Z8673 Personal history of transient ischemic attack (TIA), and cerebral infarction without residual deficits: Secondary | ICD-10-CM

## 2023-01-18 DIAGNOSIS — Z7901 Long term (current) use of anticoagulants: Secondary | ICD-10-CM

## 2023-01-18 LAB — POCT INR: INR: 2.4 (ref 2.0–3.0)

## 2023-01-18 NOTE — Patient Instructions (Signed)
Description   Take 2.5 tablets of warfarin today and then START taking warfarin 2 tablets daily except for 1.5 tablets on Sundays, Tuesdays, and Thursdays.  Recheck INR in 2 weeks.  Coumadin Clinic [phone] (Goal 2.5-3.0 per Discharge Summary on 12/08/21)

## 2023-02-03 ENCOUNTER — Ambulatory Visit: Payer: Medicaid Other | Attending: Cardiology

## 2023-02-03 DIAGNOSIS — Z7901 Long term (current) use of anticoagulants: Secondary | ICD-10-CM | POA: Diagnosis not present

## 2023-02-03 DIAGNOSIS — Z8673 Personal history of transient ischemic attack (TIA), and cerebral infarction without residual deficits: Secondary | ICD-10-CM

## 2023-02-03 DIAGNOSIS — I4821 Permanent atrial fibrillation: Secondary | ICD-10-CM | POA: Diagnosis not present

## 2023-02-03 LAB — POCT INR: INR: 3.2 — AB (ref 2.0–3.0)

## 2023-02-03 NOTE — Patient Instructions (Signed)
Description   Take 1.5 tablets of warfarin today and then continue taking warfarin 2 tablets daily except for 1.5 tablets on Sundays, Tuesdays, and Thursdays.  Stay consistent with greens each week (1-2 per week)  Recheck INR in 3 weeks.  Coumadin Clinic [phone] (Goal 2.5-3.0 per Discharge Summary on 12/08/21)

## 2023-02-09 ENCOUNTER — Ambulatory Visit (HOSPITAL_BASED_OUTPATIENT_CLINIC_OR_DEPARTMENT_OTHER): Payer: Medicaid Other | Admitting: Family Medicine

## 2023-02-09 ENCOUNTER — Ambulatory Visit (INDEPENDENT_AMBULATORY_CARE_PROVIDER_SITE_OTHER): Payer: Medicaid Other | Admitting: Family Medicine

## 2023-02-09 ENCOUNTER — Telehealth (HOSPITAL_BASED_OUTPATIENT_CLINIC_OR_DEPARTMENT_OTHER): Payer: Self-pay | Admitting: Family Medicine

## 2023-02-09 VITALS — BP 148/80 | HR 72 | Ht 59.0 in | Wt 111.8 lb

## 2023-02-09 DIAGNOSIS — E119 Type 2 diabetes mellitus without complications: Secondary | ICD-10-CM | POA: Diagnosis not present

## 2023-02-09 LAB — HEMOGLOBIN A1C
Est. average glucose Bld gHb Est-mCnc: 140 mg/dL
Hgb A1c MFr Bld: 6.5 % — ABNORMAL HIGH (ref 4.8–5.6)

## 2023-02-09 NOTE — Telephone Encounter (Signed)
[facility] is calling , they advised that they faxed over incontinent supplies and have not heard anything.  Can you advise?

## 2023-02-09 NOTE — Progress Notes (Signed)
   Established Patient Office Visit  Subjective   Patient ID: Christy Poole, female    DOB: [birth year]  Age: 62 y.o. MRN: [id number]  Christy Poole is a 62 yo female who presents today for diabetes follow-up. Her A1c was last checked on 11/10/2022 with a result of 6.4. Plan to recheck today in blood work, along with eGFR and urine microalbumin/creatinine ratio.   She has history of stroke, seizures, DM2 and atrial fibrillation. She currently is followed by cardiology (has an office visit every 6 months and frequent INR checks), neurology (has an office appointment every 6 months), and pulmonology (lung nodules noted on CT scan- plan for CT in Aug 2024 per pulm). Son is present for duration of her exam and is assisting with translation. He reports that after her stroke and seizures, she needs assistance with her activities of daily living-- including making her bed, dressing, using the bathroom, and cooking.   Her son reports she is complaining of worsening vision and would like a referral to opthalmopathy.   Her son brought paperwork for [place] to complete, which is due by today.     Review of Systems  Constitutional:  Negative for malaise/fatigue and weight loss.  Eyes:  Positive for blurred vision. Negative for double vision.  Respiratory:  Negative for cough and shortness of breath.   Cardiovascular:  Negative for chest pain and palpitations.  Gastrointestinal:  Negative for abdominal pain, nausea and vomiting.  Musculoskeletal:  Negative for myalgias.  Neurological:  Positive for seizures (history of). Negative for dizziness, weakness and headaches.  Psychiatric/Behavioral:  Negative for depression and suicidal ideas. The patient is not nervous/anxious.     Objective:    BP (!) 148/80   Pulse 72   Ht  (1.499 m)   Wt 111 lb 12.8 oz (50.7 kg)   SpO2 100%   BMI 22.58 kg/m  BP Readings from Last 3 Encounters:  02/09/23 (!) 148/80  01/10/23 127/71  12/22/22 112/80      Physical Exam Constitutional:      Appearance: Normal appearance.  Cardiovascular:     Rate and Rhythm: Normal rate. Rhythm irregular.     Pulses: Normal pulses.     Heart sounds: Normal heart sounds.  Pulmonary:     Effort: Pulmonary effort is normal.     Breath sounds: Normal breath sounds.  Neurological:     Mental Status: She is alert.  Psychiatric:        Mood and Affect: Mood normal.        Behavior: Behavior normal.        Thought Content: Thought content normal.        Judgment: Judgment normal.    Assessment & Plan:  1. Type 2 diabetes mellitus without complication, without long-term current use of insulin Patient presents today for diabetes follow-up. Denies polyuria, polyphagia, and polydipsia. Most recent hemoglobin A1c was 6.4%. She continues to manage through lifestyle modifications with healthy diet. Will recheck A1c today and may initiate pharmacologic treatment. We will follow-up on A1c result to determine plan of care.  - Urine Microalbumin w/creat. ratio - Comprehensive metabolic panel - Hemoglobin A1c - Ambulatory referral to Ophthalmology   Return in about 3 months (around 05/11/2023) for Diabetes f/u.  Paperwork completed and ready to be faxed.   Alyson Reedy, FNP

## 2023-02-09 NOTE — Telephone Encounter (Signed)
Will keep an eye out for paperwork have not received yet.

## 2023-02-10 LAB — COMPREHENSIVE METABOLIC PANEL
ALT: 25 IU/L (ref 0–32)
AST: 29 IU/L (ref 0–40)
Albumin/Globulin Ratio: 1.4 (ref 1.2–2.2)
Albumin: 4.2 g/dL (ref 3.9–4.9)
Alkaline Phosphatase: 85 IU/L (ref 44–121)
BUN/Creatinine Ratio: 20 (ref 12–28)
BUN: 16 mg/dL (ref 8–27)
Bilirubin Total: 0.7 mg/dL (ref 0.0–1.2)
CO2: 24 mmol/L (ref 20–29)
Calcium: 9.6 mg/dL (ref 8.7–10.3)
Chloride: 106 mmol/L (ref 96–106)
Creatinine, Ser: 0.79 mg/dL (ref 0.57–1.00)
Globulin, Total: 2.9 g/dL (ref 1.5–4.5)
Glucose: 111 mg/dL — ABNORMAL HIGH (ref 70–99)
Potassium: 5.1 mmol/L (ref 3.5–5.2)
Sodium: 141 mmol/L (ref 134–144)
Total Protein: 7.1 g/dL (ref 6.0–8.5)
eGFR: 84 mL/min/{1.73_m2} (ref >59)

## 2023-02-10 LAB — MICROALBUMIN / CREATININE URINE RATIO
Creatinine, Urine: 52.7 mg/dL
Microalb/Creat Ratio: 49 mg/g creat — ABNORMAL HIGH (ref 0–29)
Microalbumin, Urine: 25.7 ug/mL

## 2023-02-17 ENCOUNTER — Other Ambulatory Visit (HOSPITAL_BASED_OUTPATIENT_CLINIC_OR_DEPARTMENT_OTHER): Payer: Self-pay | Admitting: Family Medicine

## 2023-02-17 DIAGNOSIS — E119 Type 2 diabetes mellitus without complications: Secondary | ICD-10-CM

## 2023-02-17 MED ORDER — METFORMIN HCL ER 500 MG PO TB24: 500.0000 mg | ORAL_TABLET | Freq: Every day | ORAL | 1 refills | Status: DC

## 2023-02-17 MED ORDER — LISINOPRIL 2.5 MG PO TABS: 5.0000 mg | ORAL_TABLET | Freq: Every day | ORAL | 1 refills | Status: DC

## 2023-02-24 ENCOUNTER — Ambulatory Visit: Payer: Medicaid Other | Attending: Cardiovascular Disease | Admitting: *Deleted

## 2023-02-24 DIAGNOSIS — Z5181 Encounter for therapeutic drug level monitoring: Secondary | ICD-10-CM

## 2023-02-24 DIAGNOSIS — Z7901 Long term (current) use of anticoagulants: Secondary | ICD-10-CM | POA: Diagnosis not present

## 2023-02-24 DIAGNOSIS — I4821 Permanent atrial fibrillation: Secondary | ICD-10-CM | POA: Diagnosis not present

## 2023-02-24 DIAGNOSIS — Z8673 Personal history of transient ischemic attack (TIA), and cerebral infarction without residual deficits: Secondary | ICD-10-CM | POA: Diagnosis not present

## 2023-02-24 LAB — POCT INR: POC INR: 3.4

## 2023-02-24 NOTE — Patient Instructions (Signed)
Description   Take 1.5 tablets of warfarin today and then start taking warfarin 1.5 tablet daily excpet for 2 tablet on Monday and Fridays.  Stay consistent with greens each week (1-2 per week)  Recheck INR in 3 weeks.  Coumadin Clinic [phone] (Goal 2.5-3.0 per Discharge Summary on 12/08/21)

## 2023-02-25 ENCOUNTER — Other Ambulatory Visit: Payer: Self-pay | Admitting: Pulmonary Disease

## 2023-03-03 ENCOUNTER — Encounter (HOSPITAL_BASED_OUTPATIENT_CLINIC_OR_DEPARTMENT_OTHER): Payer: Self-pay

## 2023-03-03 ENCOUNTER — Ambulatory Visit (HOSPITAL_BASED_OUTPATIENT_CLINIC_OR_DEPARTMENT_OTHER): Payer: 59 | Admitting: Cardiology

## 2023-03-16 ENCOUNTER — Ambulatory Visit (HOSPITAL_BASED_OUTPATIENT_CLINIC_OR_DEPARTMENT_OTHER): Payer: Medicaid Other | Admitting: Pulmonary Disease

## 2023-03-16 ENCOUNTER — Other Ambulatory Visit (HOSPITAL_BASED_OUTPATIENT_CLINIC_OR_DEPARTMENT_OTHER): Payer: Self-pay

## 2023-03-16 ENCOUNTER — Encounter (HOSPITAL_BASED_OUTPATIENT_CLINIC_OR_DEPARTMENT_OTHER): Payer: Self-pay | Admitting: Pulmonary Disease

## 2023-03-16 VITALS — BP 108/72 | HR 75 | Temp 98.3°F | Ht 59.0 in | Wt 111.8 lb

## 2023-03-16 DIAGNOSIS — J42 Unspecified chronic bronchitis: Secondary | ICD-10-CM | POA: Diagnosis not present

## 2023-03-16 MED ORDER — OPTICHAMBER DIAMOND-LG MASK DEVI
0 refills | Status: DC
  Filled 2023-03-16: qty 1, 1d supply, fill #0

## 2023-03-16 MED ORDER — MOMETASONE FURO-FORMOTEROL FUM 100-5 MCG/ACT IN AERO: 2.0000 | INHALATION_SPRAY | Freq: Two times a day (BID) | RESPIRATORY_TRACT | 2 refills | Status: DC

## 2023-03-16 NOTE — Patient Instructions (Addendum)
Mediastinal lymphadenopathy 13 mm hypervascular AP window lesion shows no hypermetabolism on PET --Plan for CT Chest with contrast for 6 month follow-up for August 2024.   Chronic bronchitis --CONTINUE Dulera 100 mcg TWO puffs in the morning and evening --ORDER spacer. Use with inhaler to help with coordination --Counseled on inhaler use --Encourage regular aerobic activity including daily walks

## 2023-03-16 NOTE — Progress Notes (Signed)
Subjective:   PATIENT ID: Christy Poole GENDER: female DOB: [birth year], MRN: [id number]  Chief Complaint  Patient presents with   Follow-up    Follow up.   Reason for Visit: Cough  [name] is a 62 year old never smoker with stroke, seizures, DM2 and atrial fibrillation who presents for follow-up for cough  Initial consult She reports shortness of breath since 2018 after her stroke. In the last week she has noticed worsening shortness of breath, chest tightness and wheezing. Associated with unproductive cough sometimes. Does not uses inhalers. Sometimes has chest tightness when laying down in the last year. Denies personal history of asthma. Mother had breathing issues. Denies weight change (5lbs over 1 year), night sweats or unexplained fevers/chills.  12/22/22 Son present to provide additional history. Patient completed PFTs and discussed results on phone however has not started [facility] since last visit. Continues to have unchanged symptoms of shortness of breath, cough, chest tightness and wheezing.  03/16/23 Son present and translates with [place]. No translator present. Since our last visit she has had a seizure a month ago. She is using her inhaler but having difficulty coordinating breaths. She is productive coughing and wheezing. Also had recent URI caught by her granddaughter. Overall improved from acute illness but still has persistent cough.  Social History: Never smoker   Environmental exposures: Denies  Past Medical History:  Diagnosis Date   Atrial fibrillation (HCC)    Diabetes mellitus without complication (HCC)    Stroke (HCC)    Thyroid disease     No Known Allergies   Outpatient Medications Prior to Visit  Medication Sig Dispense Refill   acetaminophen (TYLENOL) 500 MG tablet Take 1,000 mg by mouth every 6 (six) hours as needed for mild pain, headache or fever.     albuterol (VENTOLIN HFA) 108 (90 Base) MCG/ACT inhaler Inhale 1-2 puffs into the  lungs every 4 (four) hours as needed for wheezing or shortness of breath. 1 each 2   ASPIRIN LOW DOSE 81 MG tablet TAKE 1 TABLET (81 MG TOTAL) BY MOUTH DAILY. (SWALLOW WHOLE) 90 tablet 1   atorvastatin (LIPITOR) 80 MG tablet TAKE 1 TABLET (80 MG TOTAL) BY MOUTH DAILY. 90 tablet 1   levETIRAcetam (KEPPRA) 250 MG tablet Take 1 tablet (250 mg total) by mouth 2 (two) times daily. 60 tablet 3   levothyroxine (SYNTHROID) 50 MCG tablet TAKE 1 TABLET (50 MCG TOTAL) BY MOUTH AT BEDTIME. 90 tablet 0   lisinopril (ZESTRIL) 2.5 MG tablet Take 2 tablets (5 mg total) by mouth daily. 90 tablet 1   metFORMIN (GLUCOPHAGE-XR) 500 MG 24 hr tablet Take 1 tablet (500 mg total) by mouth daily with breakfast. 90 tablet 1   metoprolol succinate (TOPROL-XL) 50 MG 24 hr tablet Take 1.5 tablets (75 mg total) by mouth daily. 135 tablet 3   warfarin (COUMADIN) 2.5 MG tablet TAKE 1 TABLET TO 2 TABLETS DAILY OR AS PRESCRIBED BY COUMADIN CLINIC 70 tablet 2   mometasone-formoterol (DULERA) 100-5 MCG/ACT AERO Inhale 2 puffs into the lungs in the morning and at bedtime. 1 each 2   No facility-administered medications prior to visit.    Review of Systems  Constitutional:  Negative for chills, diaphoresis, fever, malaise/fatigue and weight loss.  HENT:  Negative for congestion.   Respiratory:  Positive for cough and wheezing. Negative for hemoptysis, sputum production and shortness of breath.   Cardiovascular:  Negative for chest pain, palpitations and leg swelling.  Objective:   Vitals:   03/16/23 0925  BP: 108/72  Pulse: 75  Temp: 98.3 F (36.8 C)  TempSrc: Oral  SpO2: 97%  Weight: 111 lb 12.8 oz (50.7 kg)  Height: 4\' 11"  (1.499 m)    SpO2: 97 % O2 Device: None (Room air) Physical Exam: General: Well-appearing, no acute distress HENT: [place], AT Eyes: EOMI, no scleral icterus Respiratory: Clear to auscultation bilaterally.  No crackles, wheezing or rales Cardiovascular: RRR, -M/R/G, no  JVD Extremities:-Edema,-tenderness Neuro: AAO x4, CNII-XII grossly intact Psych: Normal mood, normal affect  Data Reviewed:  Imaging: CT Chest 09/22/23 - 1.3 x 11 rounded lesion in upper AP window. Unchanged compared to 12/09/21. Mosaic attenuation of lungs PET 12/09/22 - 1.3 cm AP window lesion with no hypermetabolism. Borderline mediastinal and left hilar lymph nodes with low level hypermetabolism  PFT: 11/18/22 FVC 0.94 (38%) FEV1 0.84 (44%) Ratio 83  Interpretation: Reduced FVC and FEV1. Normal ratio. Will need lung volume testing to confirm restrictive defect  Labs: CBC    Component Value Date/Time   WBC 6.0 11/10/2022 0912   WBC 6.5 12/15/2021 0749   RBC 4.73 11/10/2022 0912   RBC 4.91 12/15/2021 0749   HGB 13.9 11/10/2022 0912   HCT 41.3 11/10/2022 0912   PLT 171 11/10/2022 0912   MCV 87 11/10/2022 0912   MCH 29.4 11/10/2022 0912   MCH 28.7 12/15/2021 0749   MCHC 33.7 11/10/2022 0912   MCHC 32.8 12/15/2021 0749   RDW 12.6 11/10/2022 0912   LYMPHSABS 1.5 11/10/2022 0912   MONOABS 0.4 12/15/2021 0749   EOSABS 0.2 11/10/2022 0912   BASOSABS 0.1 11/10/2022 0912        Assessment & Plan:   Discussion: 62 year old never smoker with stroke, seizures, DM2 and atrial fibrillation who presents for follow-up for shortness of breath. Son has been helping patient with inhaler however she still struggles with technique. Counseled on spacer use to assist with medication delivery.  Previously had discussed CT scan at length regarding stable lymph node. PET/CT with no avidity in concerned lesion with mild activity in mediastinal and left hilar lymph node. Recommend serial surveillance for stability, low suspicion for malignancy at this time that would warrant invasive diagnostic evaluation.    Mediastinal lymphadenopathy 13 mm hypervascular AP window lesion shows no hypermetabolism on PET --Plan for CT Chest with contrast for 6 month follow-up for August 2024. Orders in place,  not yet scheduled.  Chronic bronchitis - not controlled --CONTINUE Dulera 100 mcg TWO puffs in the morning and evening --ORDER spacer. Use with inhaler to help with coordination --Counseled on inhaler use --Encourage regular aerobic activity including daily walks  Health Maintenance Immunization History  Administered Date(s) Administered   Influenza,inj,Quad PF,6+ Mos 11/10/2022   CT Lung Screen - never smoker. Not qualified  No orders of the defined types were placed in this encounter.  Meds ordered this encounter  Medications   Spacer/Aero-Holding Chambers (OPTICHAMBER DIAMOND-LG MASK) DEVI    Sig: Use as directed.    Dispense:  1 each    Refill:  0   mometasone-formoterol (DULERA) 100-5 MCG/ACT AERO    Sig: Inhale 2 puffs into the lungs in the morning and at bedtime.    Dispense:  1 each    Refill:  2    Return in about 3 months (around 06/16/2023) for after CT scan in August.  I have spent a total time of 34-minutes on the day of the appointment including chart review, data  review, collecting history, coordinating care and discussing medical diagnosis and plan with the patient/family. Past medical history, allergies, medications were reviewed. Pertinent imaging, labs and tests included in this note have been reviewed and interpreted independently by me.  Loraine Freid Mechele [facility], MD [facility] 03/16/2023 10:04 AM  Office Number [phone]

## 2023-03-17 ENCOUNTER — Ambulatory Visit: Payer: Medicaid Other | Attending: Cardiovascular Disease

## 2023-03-17 DIAGNOSIS — Z8673 Personal history of transient ischemic attack (TIA), and cerebral infarction without residual deficits: Secondary | ICD-10-CM | POA: Diagnosis not present

## 2023-03-17 DIAGNOSIS — Z7901 Long term (current) use of anticoagulants: Secondary | ICD-10-CM

## 2023-03-17 DIAGNOSIS — I4821 Permanent atrial fibrillation: Secondary | ICD-10-CM

## 2023-03-17 LAB — POCT INR: INR: 2.5 (ref 2.0–3.0)

## 2023-03-17 NOTE — Patient Instructions (Signed)
Continue taking warfarin 1.5 tablet daily excpet for 2 tablet on Monday and Fridays.  Stay consistent with greens each week (1-2 per week)  Recheck INR in 5 weeks.  Coumadin Clinic [phone] (Goal 2.5-3.0 per Discharge Summary on 12/08/21)

## 2023-03-22 ENCOUNTER — Ambulatory Visit (INDEPENDENT_AMBULATORY_CARE_PROVIDER_SITE_OTHER): Payer: Medicaid Other | Admitting: Family Medicine

## 2023-03-22 ENCOUNTER — Encounter (HOSPITAL_BASED_OUTPATIENT_CLINIC_OR_DEPARTMENT_OTHER): Payer: Self-pay | Admitting: Family Medicine

## 2023-03-22 VITALS — BP 121/71 | HR 81 | Ht 59.0 in | Wt 110.0 lb

## 2023-03-22 DIAGNOSIS — E1129 Type 2 diabetes mellitus with other diabetic kidney complication: Secondary | ICD-10-CM

## 2023-03-22 DIAGNOSIS — R809 Proteinuria, unspecified: Secondary | ICD-10-CM

## 2023-03-22 NOTE — Progress Notes (Signed)
   Established Patient Office Visit  Subjective   Patient ID: Christy Poole, female    DOB: 12/22/1960  Age: 62 y.o. MRN: [id number]  Christy Poole is a 62 year-old female patient who presents today for medication management of type 2 diabetes mellitus and moderately increased urine microalbumin/creatinine ratio. Son present at visit, along with interpreter present on [employer].   A1c on 4/18: 6.5 Started on metformin 500mg  once a day & lisinopril 5mg  daily   Current symptoms/problems include none.   Known diabetic complications: none Cardiovascular risk factors: diabetes mellitus, hypertension, microalbuminuria, and sedentary lifestyle Current diabetic medications include oral agent (monotherapy): metformin 500mg  daily .   Eye exam current (within one year):  order placed 02/09/2023 Weight trend: decreasing steadily Prior visit with dietician: no Current diet: well balanced Current exercise: none  Current monitoring regimen: none Any episodes of hypoglycemia? no  Is She on ACE inhibitor or angiotensin II receptor blocker?  Yes, lisinopril (Zestril) 5mg     Review of Systems  Constitutional:  Negative for malaise/fatigue.  Eyes:  Negative for blurred vision and double vision.  Respiratory:  Negative for cough and shortness of breath.   Cardiovascular:  Negative for chest pain.  Gastrointestinal:  Negative for abdominal pain, constipation, diarrhea, nausea and vomiting.  Musculoskeletal:  Negative for myalgias.  Neurological:  Negative for dizziness, weakness and headaches.  Psychiatric/Behavioral:  Negative for depression and suicidal ideas. The patient is not nervous/anxious.      Objective:    BP 121/71 (BP Location: Right Arm, Patient Position: Sitting, Cuff Size: Normal)   Pulse 81   Ht 4\' 11"  (1.499 m)   Wt 110 lb (49.9 kg)   SpO2 97%   BMI 22.22 kg/m  BP Readings from Last 3 Encounters:  03/22/23 121/71  03/16/23 108/72  02/09/23 (!) 148/80      Physical Exam Constitutional:      Appearance: Normal appearance.  Cardiovascular:     Rate and Rhythm: Normal rate and regular rhythm.     Pulses: Normal pulses.     Heart sounds: Normal heart sounds.  Pulmonary:     Effort: Pulmonary effort is normal.     Breath sounds: Normal breath sounds.  Neurological:     Mental Status: She is alert.  Psychiatric:        Mood and Affect: Mood normal.        Behavior: Behavior normal.        Thought Content: Thought content normal.        Judgment: Judgment normal.    Assessment & Plan:  1. Type 2 diabetes mellitus with diabetic microalbuminuria, without long-term current use of insulin [facility]) Patient presents today for management of type 2 diabetes. Review of labs indicated moderately increased microalbumin/creatinine ratio with elevated A1c of 6.5 on 02/17/2023. Patient started on metformin 500mg  daily and lisinopril 5mg  daily. Patient reports tolerating both new medications well. Blood pressure is well-controlled at today's visit. Denies chest pain, shortness of breath, lower extremity edema, vision changes, headaches. Patient in no acute distress and is well-appearing. Cardiovascular exam with heart regular rate and rhythm. Normal heart sounds, no murmurs present. No lower extremity edema present. Lungs clear to auscultation bilaterally. No refills needed today.   Return in about 2 months (around 05/22/2023) for Diabetes f/u.    Christy Reedy, FNP

## 2023-03-31 ENCOUNTER — Other Ambulatory Visit (HOSPITAL_BASED_OUTPATIENT_CLINIC_OR_DEPARTMENT_OTHER): Payer: Self-pay | Admitting: Family Medicine

## 2023-03-31 ENCOUNTER — Other Ambulatory Visit (HOSPITAL_BASED_OUTPATIENT_CLINIC_OR_DEPARTMENT_OTHER): Payer: Self-pay | Admitting: Cardiology

## 2023-03-31 DIAGNOSIS — I4821 Permanent atrial fibrillation: Secondary | ICD-10-CM

## 2023-03-31 DIAGNOSIS — I050 Rheumatic mitral stenosis: Secondary | ICD-10-CM

## 2023-03-31 DIAGNOSIS — E119 Type 2 diabetes mellitus without complications: Secondary | ICD-10-CM

## 2023-03-31 NOTE — Telephone Encounter (Signed)
Warfarin 2.5mg  refill Afib Last INR 03/17/23 Last OV 07/06/22

## 2023-03-31 NOTE — Telephone Encounter (Signed)
Please review for refill. Thank you! 

## 2023-04-03 ENCOUNTER — Other Ambulatory Visit: Payer: Self-pay

## 2023-04-21 ENCOUNTER — Ambulatory Visit: Payer: Medicaid Other | Attending: Cardiovascular Disease

## 2023-04-21 DIAGNOSIS — I4821 Permanent atrial fibrillation: Secondary | ICD-10-CM | POA: Diagnosis not present

## 2023-04-21 DIAGNOSIS — Z8673 Personal history of transient ischemic attack (TIA), and cerebral infarction without residual deficits: Secondary | ICD-10-CM

## 2023-04-21 DIAGNOSIS — Z7901 Long term (current) use of anticoagulants: Secondary | ICD-10-CM | POA: Diagnosis not present

## 2023-04-21 LAB — POCT INR: INR: 2.4 (ref 2.0–3.0)

## 2023-04-21 NOTE — Patient Instructions (Signed)
Description   Take 2.5 tablets today and then continue taking warfarin 1.5 tablet daily excpet for 2 tablet on Monday and Fridays.  Stay consistent with greens each week (1-2 per week)  Recheck INR in 5 weeks.  Coumadin Clinic [phone] (Goal 2.5-3.0 per Discharge Summary on 12/08/21)

## 2023-05-01 ENCOUNTER — Other Ambulatory Visit: Payer: Self-pay | Admitting: Neurology

## 2023-05-15 ENCOUNTER — Encounter (HOSPITAL_BASED_OUTPATIENT_CLINIC_OR_DEPARTMENT_OTHER): Payer: Self-pay | Admitting: Family Medicine

## 2023-05-15 ENCOUNTER — Ambulatory Visit (INDEPENDENT_AMBULATORY_CARE_PROVIDER_SITE_OTHER): Payer: Medicaid Other | Admitting: Family Medicine

## 2023-05-15 VITALS — BP 129/84 | HR 83 | Temp 97.6°F | Ht 59.0 in | Wt 110.0 lb

## 2023-05-15 DIAGNOSIS — R809 Proteinuria, unspecified: Secondary | ICD-10-CM | POA: Diagnosis not present

## 2023-05-15 DIAGNOSIS — Z8673 Personal history of transient ischemic attack (TIA), and cerebral infarction without residual deficits: Secondary | ICD-10-CM

## 2023-05-15 DIAGNOSIS — E1129 Type 2 diabetes mellitus with other diabetic kidney complication: Secondary | ICD-10-CM | POA: Diagnosis not present

## 2023-05-15 LAB — HEMOGLOBIN A1C
Est. average glucose Bld gHb Est-mCnc: 143 mg/dL
Hgb A1c MFr Bld: 6.6 % — ABNORMAL HIGH (ref 4.8–5.6)

## 2023-05-15 NOTE — Patient Instructions (Signed)
  Medication Instructions:  Your physician recommends that you continue on your current medications as directed. Please refer to the Current Medication list given to you today. --If you need a refill on any your medications before your next appointment, please call your pharmacy first. If no refills are authorized on file call the office.-- Lab Work: Your physician has recommended that you have lab work today: Yes If you have labs (blood work) drawn today and your tests are completely normal, you will receive your results via MyChart message OR a phone call from our staff.  Please ensure you check your voicemail in the event that you authorized detailed messages to be left on a delegated number. If you have any lab test that is abnormal or we need to change your treatment, we will call you to review the results.  Referrals/Procedures/Imaging: No  Follow-Up: Your next appointment:   Your physician recommends that you schedule a follow-up appointment in: 3 months with Dr. de [place].  You will receive a text message or e-mail with a link to a survey about your care and experience with us today! We would greatly appreciate your feedback!   Thanks for letting us be apart of your health journey!!  Primary Care and Sports Medicine   Dr. Raymond de Cuba   We encourage you to activate your patient portal called "MyChart".  Sign up information is provided on this After Visit Summary.  MyChart is used to connect with patients for Virtual Visits (Telemedicine).  Patients are able to view lab/test results, encounter notes, upcoming appointments, etc.  Non-urgent messages can be sent to your provider as well. To learn more about what you can do with MyChart, please visit --  [email or web address].    

## 2023-05-15 NOTE — Progress Notes (Unsigned)
    Procedures performed today:    None.  Independent interpretation of notes and tests performed by another provider:   None.  Brief History, Exam, Impression, and Recommendations:    BP 129/84 (BP Location: Right Arm, Patient Position: Sitting, Cuff Size: Normal)   Pulse 83   Temp 97.6 F (36.4 C)   Ht 4\' 11"  (1.499 m)   Wt 110 lb (49.9 kg)   SpO2 100%   BMI 22.22 kg/m   There are no diagnoses linked to this encounter.  No follow-ups on file. Patient continues with metformin, tolerating medication well.  Only complaint is size of the pill.  Last hemoglobin A1c was at goal a little bit over 3 months ago. We will check hemoglobin A1c today for monitoring Can continue with current medication regimen. Spent 35 minutes on this patient encounter, including preparation, chart review, face-to-face counseling with patient and coordination of care, and documentation of encounter   ___________________________________________ Viviane Semidey de [place], MD, ABFM, [facility] Primary Care and Sports Medicine [facility]

## 2023-05-17 NOTE — Assessment & Plan Note (Signed)
Patient continues with metformin, tolerating medication well.  Only complaint is size of the pill.  Last hemoglobin A1c was at goal a little bit over 3 months ago. We will check hemoglobin A1c today for monitoring Can continue with current medication regimen.

## 2023-05-17 NOTE — Assessment & Plan Note (Signed)
Patient and son also have questions about previously completed form.  It seems that he is being told by an advocate or representative that they are awaiting form from [place] regarding need for home care services for patient.  We did previously complete this form in April.  In discussing further with son, it is unclear whether the breakdown is related to having formed processed, whether form was received to the proper location and/or has been processed appropriately.  If there was some issue and how form was completed and additional information is required.  We do have previously completed form on file, verifying that it was completed and faxed as instructed.  Would recommend for patient or son to reach out to receiving company/provider to determine what other steps may be required.

## 2023-05-25 ENCOUNTER — Encounter (HOSPITAL_BASED_OUTPATIENT_CLINIC_OR_DEPARTMENT_OTHER): Payer: Self-pay | Admitting: Cardiology

## 2023-05-25 ENCOUNTER — Ambulatory Visit (HOSPITAL_BASED_OUTPATIENT_CLINIC_OR_DEPARTMENT_OTHER): Payer: Medicaid Other | Admitting: Cardiology

## 2023-05-25 VITALS — BP 118/72 | HR 78 | Ht 59.0 in | Wt 110.4 lb

## 2023-05-25 DIAGNOSIS — Z8673 Personal history of transient ischemic attack (TIA), and cerebral infarction without residual deficits: Secondary | ICD-10-CM

## 2023-05-25 DIAGNOSIS — I4821 Permanent atrial fibrillation: Secondary | ICD-10-CM | POA: Diagnosis not present

## 2023-05-25 DIAGNOSIS — I2089 Other forms of angina pectoris: Secondary | ICD-10-CM

## 2023-05-25 DIAGNOSIS — I050 Rheumatic mitral stenosis: Secondary | ICD-10-CM | POA: Diagnosis not present

## 2023-05-25 DIAGNOSIS — Z7901 Long term (current) use of anticoagulants: Secondary | ICD-10-CM | POA: Diagnosis not present

## 2023-05-25 NOTE — Progress Notes (Signed)
Cardiology Office Note:  .   Date:  05/25/2023  ID:  Christy Poole, DOB 1961/01/14, MRN [id number] PCP: de [place], Raymond J, MD  [facility] Providers Cardiologist:  Jodelle Red, MD {  History of Present Illness: .   Christy Poole is a 62 y.o. 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 Initially seen by Dr. Royann Shivers in the hospital 11/2021.   Cardiac history: Rheumatic mitral stenosis, s/p balloon valvuloplasty in [place] in ~2016/2017. Reported prior cath without significant CAD. Followed in [place] and then [facility] in [place]. History of TIA/CVA as well, initial 2019 and most recent 11/2021. Echo 11/2021 with moderate MS, Wilkins score 7-8. Permanent atrial fibrillation. On coumadin.   CV risk factors: She is vegetarian and eats primarily [place] food. She does not exercise regularly. Father had MI, brother had bypass surgery.  Today: Son is here and acts as interpreter today. Since our last visit, she has been seen by Dr. Everardo All, Dr. Pearlean Brownie, and Dr. De Peru. Has questions today re: if they need additional testing to monitor her for stroke and dementia. She has chronic weakness, and she has also been getting more forgetful. They are working to get home care to assist her.  Gets short of breath with some chest tightness when climbing stairs/exerting herself. No significant symptoms at rest. She is minimally active at baseline. Had once episode of loss of consciousness/possible seizure in December 2023 but none since then.  ROS: Denies shortness of breath at rest. No PND, orthopnea, LE edema or unexpected weight gain. ROS otherwise negative except as noted.   Studies Reviewed: Marland Kitchen    EKG:  EKG Interpretation Date/Time:  Thursday May 25 2023 08:02:07 EDT Ventricular Rate:  78 PR Interval:    QRS Duration:  64 QT Interval:  376 QTC  Calculation: 428 R Axis:   46  Text Interpretation: Atrial fibrillation Low septal forces Confirmed by Jodelle Red [id number] on 05/25/2023 8:06:08 AM    Physical Exam:   VS:  BP 118/72 (BP Location: Right Arm, Patient Position: Sitting, Cuff Size: Normal)   Pulse 78   Ht 4\' 11"  (1.499 m)   Wt 110 lb 6.4 oz (50.1 kg)   BMI 22.30 kg/m    Wt Readings from Last 3 Encounters:  05/25/23 110 lb 6.4 oz (50.1 kg)  05/15/23 110 lb (49.9 kg)  03/22/23 110 lb (49.9 kg)    GEN: Well nourished, well developed in no acute distress HEENT: Normal, moist mucous membranes NECK: No JVD CARDIAC: regular rhythm, normal S1 and S2, no rubs or gallops. No murmur. VASCULAR: Radial and DP pulses 2+ bilaterally. No carotid bruits RESPIRATORY:  Clear to auscultation without rales, wheezing or rhonchi  ABDOMEN: Soft, non-tender, non-distended MUSCULOSKELETAL:  Ambulates independently SKIN: Warm and dry, no edema NEUROLOGIC:  Alert and oriented x 3. No focal neuro deficits noted. PSYCHIATRIC:  Normal affect    ASSESSMENT AND PLAN: .     Rheumatic mitral stenosis -agree with Dr. Royann Shivers that echo 11/2021 likely moderate mitral stenosis. S/p prior balloon valvuloplasty, Wilkins score 7-8 -tolerating 75 mg metoprolol dose -suspect exertional chest pain/shortness of breath is stable angina due to overall decreased perfusion with fast heart rates -reviewed red flag warning signs that need immediate medical attention -they will contact me with any new concerns or worsening symptoms -repeat echo 05/2022 reviewed. Recheck 2025 or if symptoms worsen. -  not taking lisinopril, BP at goal, will take off list   Permanent atrial fibrillation Prior TIA History of CVA in 2019 with left facial and left arm deficits -given that this is valvular atrial fibrillation, needs to be on coumadin (no DOAC) -on aspirin, atorvastatin as well as coumadin, denies bleeding issues   CV risk counseling and prevention -recommend  heart healthy/Mediterranean diet, with whole grains, fruits, vegetable, fish, lean meats, nuts, and olive oil. Limit salt. -recommend moderate walking, 3-5 times/week for 30-50 minutes each session. Aim for at least 150 minutes.week. Goal should be pace of 3 miles/hours, or walking 1.5 miles in 30 minutes -recommend avoidance of tobacco products. Avoid excess alcohol.  Dispo: 6 mos  Signed, Jodelle Red, MD   Jodelle Red, MD, PhD, [facility]  [facility]  [place]  [facility] & Vascular at [facility] at [facility] [address] [place], [place] [address] [phone]

## 2023-05-25 NOTE — Patient Instructions (Signed)

## 2023-05-26 ENCOUNTER — Other Ambulatory Visit: Payer: Self-pay

## 2023-05-26 ENCOUNTER — Ambulatory Visit: Payer: Medicaid Other | Attending: Cardiovascular Disease

## 2023-05-26 DIAGNOSIS — Z8673 Personal history of transient ischemic attack (TIA), and cerebral infarction without residual deficits: Secondary | ICD-10-CM | POA: Diagnosis not present

## 2023-05-26 DIAGNOSIS — I4821 Permanent atrial fibrillation: Secondary | ICD-10-CM

## 2023-05-26 DIAGNOSIS — I050 Rheumatic mitral stenosis: Secondary | ICD-10-CM

## 2023-05-26 DIAGNOSIS — Z7901 Long term (current) use of anticoagulants: Secondary | ICD-10-CM | POA: Diagnosis not present

## 2023-05-26 LAB — POCT INR: INR: 2.9 (ref 2.0–3.0)

## 2023-05-26 MED ORDER — WARFARIN SODIUM 2.5 MG PO TABS: ORAL_TABLET | ORAL | 3 refills | Status: DC

## 2023-05-26 NOTE — Patient Instructions (Signed)
continue taking warfarin 1.5 tablet daily excpet for 2 tablets on Monday and Fridays.  Stay consistent with greens each week (1-2 per week)  Recheck INR in 6 weeks.  Coumadin Clinic [phone] (Goal 2.5-3.0 per Discharge Summary on 12/08/21)

## 2023-06-16 ENCOUNTER — Ambulatory Visit (HOSPITAL_BASED_OUTPATIENT_CLINIC_OR_DEPARTMENT_OTHER)
Admission: RE | Admit: 2023-06-16 | Discharge: 2023-06-16 | Disposition: A | Discharge status: Home / Self Care | Payer: Medicaid Other | Source: Ambulatory Visit | Attending: Pulmonary Disease | Admitting: Pulmonary Disease

## 2023-06-16 DIAGNOSIS — R590 Localized enlarged lymph nodes: Secondary | ICD-10-CM | POA: Insufficient documentation

## 2023-06-16 LAB — POCT I-STAT CREATININE: Creatinine, Ser: 0.7 mg/dL (ref 0.44–1.00)

## 2023-06-16 MED ORDER — IOHEXOL 300 MG/ML  SOLN
80.0000 mL | Freq: Once | INTRAMUSCULAR | Status: AC | PRN
  Administered 2023-06-16: 80 mL via INTRAVENOUS

## 2023-06-30 ENCOUNTER — Other Ambulatory Visit (HOSPITAL_BASED_OUTPATIENT_CLINIC_OR_DEPARTMENT_OTHER): Payer: Self-pay | Admitting: Cardiology

## 2023-06-30 ENCOUNTER — Other Ambulatory Visit (HOSPITAL_BASED_OUTPATIENT_CLINIC_OR_DEPARTMENT_OTHER): Payer: Self-pay | Admitting: Family Medicine

## 2023-06-30 DIAGNOSIS — I050 Rheumatic mitral stenosis: Secondary | ICD-10-CM

## 2023-06-30 DIAGNOSIS — I4821 Permanent atrial fibrillation: Secondary | ICD-10-CM

## 2023-07-07 ENCOUNTER — Ambulatory Visit: Payer: Medicaid Other | Attending: Cardiology | Admitting: *Deleted

## 2023-07-07 DIAGNOSIS — Z8673 Personal history of transient ischemic attack (TIA), and cerebral infarction without residual deficits: Secondary | ICD-10-CM

## 2023-07-07 DIAGNOSIS — Z7901 Long term (current) use of anticoagulants: Secondary | ICD-10-CM

## 2023-07-07 DIAGNOSIS — I4821 Permanent atrial fibrillation: Secondary | ICD-10-CM | POA: Diagnosis not present

## 2023-07-07 LAB — POCT INR: INR: 2 (ref 2.0–3.0)

## 2023-07-07 NOTE — Patient Instructions (Signed)
Description   Today take 2.5 tablets of warfarin then continue taking warfarin 1.5 tablet daily excpet for 2 tablets on Monday and Fridays.  Stay consistent with greens each week (1-2 per week)  Recheck INR in 5 weeks.  Coumadin Clinic [phone] (Goal 2.5-3.0 per Discharge Summary on 12/08/21)

## 2023-07-12 ENCOUNTER — Ambulatory Visit (INDEPENDENT_AMBULATORY_CARE_PROVIDER_SITE_OTHER): Payer: Medicaid Other | Admitting: Neurology

## 2023-07-12 ENCOUNTER — Encounter: Payer: Self-pay | Admitting: Neurology

## 2023-07-12 VITALS — BP 144/73 | HR 71 | Ht 59.0 in | Wt 111.0 lb

## 2023-07-12 DIAGNOSIS — R202 Paresthesia of skin: Secondary | ICD-10-CM | POA: Diagnosis not present

## 2023-07-12 DIAGNOSIS — Z8673 Personal history of transient ischemic attack (TIA), and cerebral infarction without residual deficits: Secondary | ICD-10-CM

## 2023-07-12 DIAGNOSIS — R413 Other amnesia: Secondary | ICD-10-CM

## 2023-07-12 DIAGNOSIS — G3184 Mild cognitive impairment, so stated: Secondary | ICD-10-CM

## 2023-07-12 MED ORDER — GABAPENTIN 100 MG PO CAPS: 100.0000 mg | ORAL_CAPSULE | Freq: Every day | ORAL | 1 refills | Status: AC

## 2023-07-12 NOTE — Progress Notes (Signed)
[facility] [address] [place]. [place] [address]       HOSPITAL FOLLOW UP NOTE  [name] Date of Birth:  08-Oct-1961 Medical Record Number:  [id number]   Reason for Referral:  hospital stroke follow up    SUBJECTIVE:   CHIEF COMPLAINT:  Chief Complaint  Patient presents with   Follow-up    RM 18, here with son Christy Poole  Pt is here for seizure follow up. Pt's son states pt has not had any seizures. Pt states she is having left leg/hand pain/numbness.     HPI:   Update 07/12/2023 : She returns for follow-up after last visit 6 months ago.  She is accompanied by her son and granddaughter.  Patient continues to do well from neurovascular standpoint without recurrent stroke or TIA symptoms.  She remains on Coumadin which is tolerating well with minor bruising and no bleeding.  Last INR on 07/07/2023 was 2.0.  She is tolerating Lipitor well without muscle aches and pains and states her diabetes is under good control and last hemoglobin A1c on 05/15/2023 was 6.6.  Patient remains on Keppra which is tolerating well without side effects but she has had no episodes of staring or seizure-like events.  The patient is complaining today about paresthesias in the feet particularly at night and she has trouble sleeping because of this.  She is able to ambulate independently but her balance is not good and she has to walk slowly and uses a walker.  She had 1 fall in the last 6 months but no injuries.  The patient has had some mild memory difficulties since her stroke but the son feels that may be getting worse.  She has become more forgetful.  She occasionally forgets to take them meds.  Family needs to remind her constantly.  She is otherwise quite independent in activities of daily living.  She continues to have mild left body paresthesia since her stroke as well as complaints of getting tired very easily. Update 01/10/2023 : She returns for follow-up after last visit 4 months ago.   She is accompanied by her son who provides most of the history and translate for her.  Patient has done well after starting Keppra and has not had any further episodes of staring or altered consciousness since December 2023.  She is tolerating Keppra well without side effects.  She has had no recurrent stroke or TIA symptoms.  She remains on warfarin and does bruise easily and at times forgets to take her medications.  Her INR has been quite fluctuating.  She complains of of intermittent mild headaches as well as she is having some trouble reading.  She has not yet seen an ophthalmologist for evaluation for cataracts.  She had EEG on 11 /15/2023 which was normal..  Hemoglobin A1c was 6.4.  10/29/2011/2024 was normal. Update 08/24/2022 : Patient is seen for follow-up today after last visit with [name] NP .She is accompanied by her son.   They inform us about a new complaint today that she has had multiple episodes of brief alteredLoses consciousness and some of them and tries to follow normal heart itself.  Most of the episodes he has awakened looking at her eyes and is staring and has some stiffness of the extremities this lasted about 5 to 10 minutes he is quite tired after that.  She has not been evaluated for seizures yet.  Most of these episodes occurred at night but will buckle during the day as  well.  She was seen in the hospital in February for 2  episodes 1 week apart that she had lost consciousness and subsequently has noted some increased left-sided weakness.  MRI scan of the brain on 12/09/2021 had shown old right MCA infarct but no acute abnormalities.  CT angiogram neck showing no large vessel stenosis or occlusion repeat CT scan of the head on 12/15/2021 had also shown no acute abnormalities.  EEG was not ordered.  Patient was diagnosed with rheumatic mitral stenosis and 2017 subsequently had atrial fibrillation.  He was not on anticoagulation until 2019 when she had a large right MCA stroke.  He is  tolerating warfarin well with INR which has been fairly stable.  She denies bruising bleeding or other side effects.  Prior Visit 02/06/22 [name] nurse practitioner )[name] is a 62 y.o. 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  Noted symptoms wax and wane.  Personally reviewed hospitalization pertinent progress notes, lab work and imaging.  Evaluated by Dr. Roda Shutters for likely right brain TIA likely due to A-fib on Coumadin with lower and INR level. CTH and MR brain negative for acute stroke, noted chronic moderate size cortical/subcortical right MCA territory infarct.  CTA head/neck unremarkable.  EF 45 to 50%.  LDL 36.  A1c 6.6.  Recommend continuation of warfarin and aspirin with increased INR goal at 2.5-3.0.  Continuation of atorvastatin 80 mg daily.  She was discharged home with recommended home health PT/OT.  Return to ED 2/22 with recurrence of left facial and left arm weakness lasting 5 to 10 minutes then resolved.  Repeat CT head no acute findings.  Diagnosed with TIA.  No further work-up completed as recently completed for similar symptoms.  First passing out, about 25 min after symptoms started Similar to second time    Prior visit 01/27/2022, patient being seen for hospital follow up accompanied by her son who assists with translation and providing majority of history. Overall stable from stroke standpoint.  Denies new or reoccurring stroke/TIA symptoms.  Continued left-sided weakness and short-term memory difficulties but at baseline. Continues to work with [facility] PT. Ambulates without AD, no recent falls.  Son mentions syncopal episodes prior to her stroke in 2019 and about 25 minutes prior to recent TIA events. No additional syncopal events  since that time. Also mentions headaches over the past 6-7 months, present on both sides of head and on top in pressure/band type sensation. Denies photophobia, phonophobia or N/V. Will take tylenol with resolution. Lives with son and daughter-in-law who assists with ADLs and majority of IADLs.  Remains on aspirin and warfarin, denies side effects.  Routinely monitored by Coumadin clinic and has since establish care with cardiologist Dr. Cristal Deer.  Remains on atorvastatin, denies side effects.  Blood pressure today 131/74.  No further concerns at this time.       PERTINENT IMAGING  Per hospitalization 12/08/2021 CT no acute abnormality MRI no acute infarct, chronic moderate-sized cortical/subcortical right MCA territory infarct CT head and neck unremarkable 2D Echo EF 45 to 50% LDL 36 HgbA1c 6.6    ROS:   14 system review of systems performed and negative with exception of those listed in HPI  PMH:  Past Medical History:  Diagnosis Date   Atrial fibrillation (HCC)  Diabetes mellitus without complication (HCC)    Stroke [facility])    Thyroid disease     PSH: No past surgical history on file.  Social History:  Social History   Socioeconomic History   Marital status: Widowed    Spouse name: Not on file   Number of children: Not on file   Years of education: Not on file   Highest education level: 10th grade  Occupational History   Not on file  Tobacco Use   Smoking status: Never   Smokeless tobacco: Never  Vaping Use   Vaping status: Never Used  Substance and Sexual Activity   Alcohol use: Never   Drug use: Never   Sexual activity: Never  Other Topics Concern   Not on file  Social History Narrative   Not on file   Social Determinants of Health   Financial Resource Strain: Low Risk  (02/09/2023)   Overall Financial Resource Strain (CARDIA)    Difficulty of Paying Living Expenses: Not hard at all  Food Insecurity: Food Insecurity Present (02/09/2023)   Hunger  Vital Sign    Worried About Running Out of Food in the Last Year: Sometimes true    Ran Out of Food in the Last Year: Sometimes true  Transportation Needs: Unmet Transportation Needs (02/09/2023)   PRAPARE - Transportation    Lack of Transportation (Medical): Yes    Lack of Transportation (Non-Medical): Yes  Physical Activity: Insufficiently Active (02/09/2023)   Exercise Vital Sign    Days of Exercise per Week: 7 days    Minutes of Exercise per Session: 10 min  Stress: Stress Concern Present (02/09/2023)   [employer] of Occupational Health - Occupational Stress Questionnaire    Feeling of Stress : Very much  Social Connections: Socially Isolated (02/09/2023)   Social Connection and Isolation Panel [NHANES]    Frequency of Communication with Friends and Family: Once a week    Frequency of Social Gatherings with Friends and Family: More than three times a week    Attends Religious Services: Never    Database administrator or Organizations: No    Attends Banker Meetings: Not on file    Marital Status: Widowed  Intimate Partner Violence: Not on file    Family History: No family history on file.  Medications:   Current Outpatient Medications on File Prior to Visit  Medication Sig Dispense Refill   acetaminophen (TYLENOL) 500 MG tablet Take 1,000 mg by mouth every 6 (six) hours as needed for mild pain, headache or fever.     albuterol (VENTOLIN HFA) 108 (90 Base) MCG/ACT inhaler Inhale 1-2 puffs into the lungs every 4 (four) hours as needed for wheezing or shortness of breath. 1 each 2   aspirin EC (ASPIRIN LOW DOSE) 81 MG tablet TAKE 1 TABLET (81 MG TOTAL) BY MOUTH DAILY. (SWALLOW WHOLE) 90 tablet 3   atorvastatin (LIPITOR) 80 MG tablet TAKE 1 TABLET (80 MG TOTAL) BY MOUTH DAILY. 90 tablet 3   levETIRAcetam (KEPPRA) 250 MG tablet TAKE 1 TABLET (250 MG TOTAL) BY MOUTH 2 (TWO) TIMES DAILY. 60 tablet 3   levothyroxine (SYNTHROID) 50 MCG tablet TAKE 1 TABLET (50 MCG TOTAL)  BY MOUTH AT BEDTIME. 90 tablet 0   metFORMIN (GLUCOPHAGE-XR) 500 MG 24 hr tablet Take 1 tablet (500 mg total) by mouth daily with breakfast. 90 tablet 1   metoprolol succinate (TOPROL-XL) 50 MG 24 hr tablet TAKE 1 & 1/2 TABLETS (75 MG TOTAL) BY MOUTH DAILY. 135  tablet 3   mometasone-formoterol (DULERA) 100-5 MCG/ACT AERO Inhale 2 puffs into the lungs in the morning and at bedtime. 1 each 2   Spacer/Aero-Holding Chambers (OPTICHAMBER DIAMOND-LG MASK) DEVI Use as directed. 1 each 0   warfarin (COUMADIN) 2.5 MG tablet TAKE 1-2 TABLETS DAILY OR AS PRESCRIBED BY COUMADIN CLINIC 60 tablet 3   No current facility-administered medications on file prior to visit.    Allergies:  No Known Allergies    OBJECTIVE:  Physical Exam  Vitals:   07/12/23 1247  BP: (!) 144/73  Pulse: 71  Weight: 111 lb (50.3 kg)  Height: 4\' 11"  (1.499 m)   Body mass index is 22.42 kg/m. No results found.  General: Frail petite, pleasant middle aged [place] lady, seated, in no evident distress Head: head normocephalic and atraumatic.   Neck: supple with no carotid or supraclavicular bruits Cardiovascular: irregular rate and rhythm, no murmurs Musculoskeletal: no deformity Skin:  no rash/petichiae Vascular:  Normal pulses all extremities   Neurologic Exam Mental Status: Awake and fully alert. limited to no english - per son, mild dysarthria chronic. Denies aphasia. Oriented to place and time. Recent memory mildly impaired and remote memory intact. Son provides majority of hx due to language barrier. Mood and affect appropriate.  3 word recall 3/3.  Able to name only 5 animals which can walk on 4 legs.  Clock drawing 4/4.  Full Mini-Mental status exam not done due to language barrier Cranial Nerves: Pupils equal, briskly reactive to light. Extraocular movements full without nystagmus. Visual fields unable to test due to difficulty understanding directions, does blink to threat bilaterally. Hearing intact.  Facial sensation intact. Face, tongue, palate moves normally and symmetrically.  Motor: Normal bulk and tone. Normal strength on right side. mild 4/5 left sided weakness chronic diminished fine finger movements on the left.  Mild left grip weakness.  Orbits right over left upper extremity. Sensory.: intact to touch , pinprick , position and vibratory sensation.  Positive Romberg sign. Coordination: Rapid alternating movements normal in all extremities except slightly decreased left hand. Finger-to-nose and heel-to-shin performed accurately bilaterally. Gait and Station: Arises from chair without difficulty. Stance is normal. Gait demonstrates normal stride length and with mild imbalance without use of AD. Unable to complete tandem walk and heel toe.  Reflexes: 1+ and symmetric except for ankle jerks are depressed. Toes downgoing.            ASSESSMENT: Christy Poole is a 62 y.o. year old female right brain TIA on 12/08/2021 and 12/15/2021 likely due to A-fib on Coumadin with lower and INR level. Vascular risk factors include history of right MCA stroke with residual left-sided weakness and facial weakness, hx of rheumatic mitral valve stenosis and A-fib on Coumadin, CAD s/p stent 2016, HLD and new dx of DM.  She has also had multiple recurrent episodes of transient altered awareness with staring into space with stiffening of her extremities lasting 5 to 10 minutes followed by tiredness occurring now at the frequency of once every 2 months it seems like complex partial seizures which have shown response to Keppra.  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  Memory loss and mild cognitive difficulties poststroke likely due to mild cognitive impairment as well.     PLAN:  I had a long discussion with the patient and her son regarding her new complaints of nocturnal paresthesias and difficulty sleeping which may be related to diabetic neuropathy as well  as her memory difficulties and mild cognitive impairment which may be related to her age and previous stroke.  I recommend further evaluation with checking memory panel and neuropathy panel labs today.  Advised her to do memory compensation strategies and increase participation in cognitively challenging activities like solving crossword puzzles, playing bridge and sudoku.  Trial of gabapentin 100 mg at bedtime to help with paresthesias and sleep and if not effective increase dose to 200 mg after 1 week if needed.  Continue Keppra for seizure prophylaxis as it seems to be working and she has not had any episodes of staring or seizure-like activity.  Continue aspirin for stroke prevention and maintain aggressive risk factor modification with strict control of diabetes with hemoglobin A1c goal below 6.5%, LDL cholesterol goal below 70 mg percent and hypertension with blood pressure goal below 130/90.  Check screening carotid ultrasound as well.  Return for follow-up in the future in 3 months with nurse practitioner call earlier if necessary.. Greater than 50% time during this 40-minute visit was spent on counseling and coordination of care about her remote stroke and results of possible complex partial seizures Delia Heady, MD  [facility] [address] [place], [place] [address]  Phone [phone] Fax [phone] Note: This document was prepared with digital dictation and possible smart phrase technology. Any transcriptional errors that result from this process are unintentional.

## 2023-07-12 NOTE — Patient Instructions (Signed)
I had a long discussion with the patient and her son regarding her new complaints of nocturnal paresthesias and difficulty sleeping which may be related to diabetic neuropathy as well as her memory difficulties and mild cognitive impairment which may be related to her age and previous stroke.  I recommend further evaluation with checking memory panel and neuropathy panel labs today.  Advised her to do memory compensation strategies and increase participation in cognitively challenging activities like solving crossword puzzles, playing bridge and sudoku.  Trial of gabapentin 100 mg at bedtime to help with paresthesias and sleep and if not effective increase dose to 200 mg after 1 week if needed.  Continue Keppra for seizure prophylaxis as it seems to be working and she has not had any episodes of staring or seizure-like activity.  Continue aspirin for stroke prevention and maintain aggressive risk factor modification with strict control of diabetes with hemoglobin A1c goal below 6.5%, LDL cholesterol goal below 70 mg percent and hypertension with blood pressure goal below 130/90.  Check screening carotid ultrasound as well.  Return for follow-up in the future in 3 months with nurse practitioner call earlier if necessary..  Memory Compensation Strategies  Use "WARM" strategy.  W= write it down  A= associate it  R= repeat it  M= make a mental note  2.   You can keep a Glass blower/designer.  Use a 3-ring notebook with sections for the following: calendar, important names and phone numbers,  medications, doctors' names/phone numbers, lists/reminders, and a section to journal what you did  each day.   3.    Use a calendar to write appointments down.  4.    Write yourself a schedule for the day.  This can be placed on the calendar or in a separate section of the Memory Notebook.  Keeping a  regular schedule can help memory.  5.    Use medication organizer with sections for each day or morning/evening  pills.  You may need help loading it  6.    Keep a basket, or pegboard by the door.  Place items that you need to take out with you in the basket or on the pegboard.  You may also want to  include a message board for reminders.  7.    Use sticky notes.  Place sticky notes with reminders in a place where the task is performed.  For example: " turn off the  stove" placed by the stove, "lock the door" placed on the door at eye level, " take your medications" on  the bathroom mirror or by the place where you normally take your medications.  8.    Use alarms/timers.  Use while cooking to remind yourself to check on food or as a reminder to take your medicine, or as a  reminder to make a call, or as a reminder to perform another task, etc.

## 2023-07-17 LAB — NEUROPATHY PANEL
A/G Ratio: 1.2 (ref 0.7–1.7)
Albumin ELP: 4.2 g/dL (ref 2.9–4.4)
Alpha 1: 0.3 g/dL (ref 0.0–0.4)
Alpha 2: 0.7 g/dL (ref 0.4–1.0)
Angio Convert Enzyme: 50 U/L (ref 14–82)
Anti Nuclear Antibody (ANA): NEGATIVE
Beta: 1.3 g/dL (ref 0.7–1.3)
Gamma Globulin: 1.4 g/dL (ref 0.4–1.8)
Globulin, Total: 3.6 g/dL (ref 2.2–3.9)
Rheumatoid fact SerPl-aCnc: <10 [IU]/mL (ref <14.0)
Sed Rate: 2 mm/h (ref 0–40)
TSH: 1.94 u[IU]/mL (ref 0.450–4.500)
Total Protein: 7.8 g/dL (ref 6.0–8.5)
Vit D, 25-Hydroxy: 42.9 ng/mL (ref 30.0–100.0)
Vitamin B-12: 405 pg/mL (ref 232–1245)

## 2023-07-17 LAB — DEMENTIA PANEL
Homocysteine: 16.3 umol/L (ref 0.0–17.2)
RPR Ser Ql: NONREACTIVE

## 2023-07-19 ENCOUNTER — Telehealth: Payer: Self-pay | Admitting: Neurology

## 2023-07-19 NOTE — Telephone Encounter (Signed)
[facility]: [id number] exp. 07/17/23-09/15/23 sent to GI [phone]

## 2023-07-21 NOTE — Progress Notes (Signed)
Kindly inform the patient that lab work for reversible causes of memory loss and peripheral neuropathy was all satisfactory

## 2023-07-25 ENCOUNTER — Telehealth: Payer: Self-pay

## 2023-07-25 NOTE — Telephone Encounter (Signed)
Contacted pt son, informed him that his moms lab work for reversible causes of memory loss and peripheral neuropathy was all satisfactory. He dementia panel was normal.  He verbally understood. Advised to call the office back with any questions or concerns as he had none at this time and was appreciative.

## 2023-07-25 NOTE — Telephone Encounter (Signed)
-----   Message from Christy Poole sent at 07/21/2023  5:09 PM EDT ----- Kindly inform the patient that lab work for reversible causes of memory loss and peripheral neuropathy was all satisfactory

## 2023-08-11 ENCOUNTER — Ambulatory Visit: Payer: Medicaid Other | Attending: Cardiovascular Disease | Admitting: *Deleted

## 2023-08-11 DIAGNOSIS — I4821 Permanent atrial fibrillation: Secondary | ICD-10-CM

## 2023-08-11 DIAGNOSIS — Z5181 Encounter for therapeutic drug level monitoring: Secondary | ICD-10-CM

## 2023-08-11 DIAGNOSIS — Z7901 Long term (current) use of anticoagulants: Secondary | ICD-10-CM

## 2023-08-11 DIAGNOSIS — Z8673 Personal history of transient ischemic attack (TIA), and cerebral infarction without residual deficits: Secondary | ICD-10-CM | POA: Diagnosis not present

## 2023-08-11 LAB — POCT INR: POC INR: 2.8

## 2023-08-11 NOTE — Patient Instructions (Signed)
Description   Continue taking warfarin 1.5 tablet daily excpet for 2 tablets on Monday and Fridays.  Stay consistent with greens each week (1-2 per week)  Recheck INR in 5 weeks.  Coumadin Clinic [phone] (Goal 2.5-3.0 per Discharge Summary on 12/08/21)

## 2023-08-28 ENCOUNTER — Telehealth: Payer: Self-pay | Admitting: Neurology

## 2023-08-28 NOTE — Telephone Encounter (Signed)
[facility]) Would like a call back to discuss pt's diagnosis.Urgent referral Work paper for CAP/DA. Trying to rush getting papterwork. If she has a diagnosis of dementia can put a priority on it.

## 2023-08-30 ENCOUNTER — Other Ambulatory Visit: Payer: Self-pay | Admitting: Neurology

## 2023-08-30 ENCOUNTER — Ambulatory Visit (INDEPENDENT_AMBULATORY_CARE_PROVIDER_SITE_OTHER): Payer: Medicaid Other | Admitting: Family Medicine

## 2023-08-30 ENCOUNTER — Ambulatory Visit (INDEPENDENT_AMBULATORY_CARE_PROVIDER_SITE_OTHER): Payer: Medicaid Other

## 2023-08-30 VITALS — BP 154/95 | HR 71

## 2023-08-30 DIAGNOSIS — E1129 Type 2 diabetes mellitus with other diabetic kidney complication: Secondary | ICD-10-CM | POA: Diagnosis not present

## 2023-08-30 DIAGNOSIS — R059 Cough, unspecified: Secondary | ICD-10-CM | POA: Insufficient documentation

## 2023-08-30 DIAGNOSIS — R809 Proteinuria, unspecified: Secondary | ICD-10-CM

## 2023-08-30 LAB — POCT INFLUENZA A/B
Influenza A, POC: NEGATIVE
Influenza B, POC: NEGATIVE

## 2023-08-30 LAB — POC COVID19 BINAXNOW: SARS Coronavirus 2 Ag: NEGATIVE

## 2023-08-30 MED ORDER — BENZONATATE 100 MG PO CAPS: 100.0000 mg | ORAL_CAPSULE | Freq: Two times a day (BID) | ORAL | 0 refills | Status: DC | PRN

## 2023-08-30 NOTE — Telephone Encounter (Signed)
Christy Poole calling back for requesting date of diagnosis

## 2023-08-30 NOTE — Assessment & Plan Note (Signed)
Patient brought to the office today by her son for evaluation of ongoing cough.  He reports that cough started about 3 months ago.  She has been having associated shortness of breath, fatigue, weakness.  She has had history of cough, however current cough has been more persistent and intermittently productive.  She has felt feverish at home, no objective temperatures measured.  Occasional sweats. On exam, patient does not appear to be in acute distress.  She presents wearing mask, does have intermittent cough here in the office.  Vital signs are stable, O2 is slightly lower than baseline, is in the low 90s.  Lungs with occasional expiratory wheezes bilaterally. Given ongoing symptoms, there is concern for possible underlying anemia.  Given this, we can proceed with chest x-ray today.  Swabs also completed for COVID and flu testing here in the office, both were negative.  At this time, we will proceed with chest x-ray imaging for further evaluation and will communicate recommendations based upon imaging findings.  In the meantime, can utilize conservative measures to assist with cough.  Did also discuss ED precautions, recommendations to present to emergency department should worsening of symptoms occur.

## 2023-08-30 NOTE — Progress Notes (Signed)
    Procedures performed today:    None.  Independent interpretation of notes and tests performed by another provider:   None.  Brief History, Exam, Impression, and Recommendations:    BP (!) 154/95 (BP Location: Right Arm, Patient Position: Sitting, Cuff Size: Normal)   Pulse 71   SpO2 91%   Cough, unspecified type Assessment & Plan: Patient brought to the office today by her son for evaluation of ongoing cough.  He reports that cough started about 3 months ago.  She has been having associated shortness of breath, fatigue, weakness.  She has had history of cough, however current cough has been more persistent and intermittently productive.  She has felt feverish at home, no objective temperatures measured.  Occasional sweats. On exam, patient does not appear to be in acute distress.  She presents wearing mask, does have intermittent cough here in the office.  Vital signs are stable, O2 is slightly lower than baseline, is in the low 90s.  Lungs with occasional expiratory wheezes bilaterally. Given ongoing symptoms, there is concern for possible underlying anemia.  Given this, we can proceed with chest x-ray today.  Swabs also completed for COVID and flu testing here in the office, both were negative.  At this time, we will proceed with chest x-ray imaging for further evaluation and will communicate recommendations based upon imaging findings.  In the meantime, can utilize conservative measures to assist with cough.  Did also discuss ED precautions, recommendations to present to emergency department should worsening of symptoms occur.  Orders: -     POC COVID-19 BinaxNow -     POCT Influenza A/B -     DG Chest 2 View; Future  Type 2 diabetes mellitus with diabetic microalbuminuria, without long-term current use of insulin (HCC) -     Amb ref to Medical Nutrition Therapy-MNT  Other orders -     Benzonatate; Take 1 capsule (100 mg total) by mouth 2 (two) times daily as needed for cough.   Dispense: 30 capsule; Refill: 0   ___________________________________________ Timothee Gali de [place], MD, ABFM, [facility]

## 2023-08-30 NOTE — Telephone Encounter (Signed)
Called Christy Poole back and was able to advise that at the last pt's visit Dr Pearlean Brownie did note mild cognitive impairment as well as memory loss. Provided her with ICD codes for those diagnosis and She was appreciative for the call back.

## 2023-09-04 ENCOUNTER — Other Ambulatory Visit: Payer: Self-pay

## 2023-09-04 ENCOUNTER — Observation Stay (HOSPITAL_COMMUNITY): Payer: Medicaid Other

## 2023-09-04 ENCOUNTER — Inpatient Hospital Stay (HOSPITAL_COMMUNITY)
Admission: EM | Admit: 2023-09-04 | Discharge: 2023-09-13 | DRG: 193 | Disposition: A | Discharge status: Home / Self Care | Payer: Medicaid Other | Attending: Internal Medicine | Admitting: Internal Medicine

## 2023-09-04 ENCOUNTER — Emergency Department (HOSPITAL_COMMUNITY): Payer: Medicaid Other

## 2023-09-04 ENCOUNTER — Encounter (HOSPITAL_COMMUNITY): Payer: Self-pay

## 2023-09-04 ENCOUNTER — Other Ambulatory Visit: Payer: Medicaid Other

## 2023-09-04 DIAGNOSIS — J470 Bronchiectasis with acute lower respiratory infection: Secondary | ICD-10-CM | POA: Diagnosis present

## 2023-09-04 DIAGNOSIS — R791 Abnormal coagulation profile: Secondary | ICD-10-CM

## 2023-09-04 DIAGNOSIS — J42 Unspecified chronic bronchitis: Secondary | ICD-10-CM | POA: Diagnosis present

## 2023-09-04 DIAGNOSIS — T380X5A Adverse effect of glucocorticoids and synthetic analogues, initial encounter: Secondary | ICD-10-CM | POA: Diagnosis present

## 2023-09-04 DIAGNOSIS — G40209 Localization-related (focal) (partial) symptomatic epilepsy and epileptic syndromes with complex partial seizures, not intractable, without status epilepticus: Secondary | ICD-10-CM | POA: Diagnosis present

## 2023-09-04 DIAGNOSIS — Z7951 Long term (current) use of inhaled steroids: Secondary | ICD-10-CM

## 2023-09-04 DIAGNOSIS — N289 Disorder of kidney and ureter, unspecified: Secondary | ICD-10-CM

## 2023-09-04 DIAGNOSIS — Z7901 Long term (current) use of anticoagulants: Secondary | ICD-10-CM

## 2023-09-04 DIAGNOSIS — R54 Age-related physical debility: Secondary | ICD-10-CM | POA: Diagnosis present

## 2023-09-04 DIAGNOSIS — Z7984 Long term (current) use of oral hypoglycemic drugs: Secondary | ICD-10-CM

## 2023-09-04 DIAGNOSIS — E1165 Type 2 diabetes mellitus with hyperglycemia: Secondary | ICD-10-CM | POA: Diagnosis present

## 2023-09-04 DIAGNOSIS — J984 Other disorders of lung: Secondary | ICD-10-CM | POA: Diagnosis present

## 2023-09-04 DIAGNOSIS — R072 Precordial pain: Secondary | ICD-10-CM

## 2023-09-04 DIAGNOSIS — J9691 Respiratory failure, unspecified with hypoxia: Principal | ICD-10-CM

## 2023-09-04 DIAGNOSIS — Z952 Presence of prosthetic heart valve: Secondary | ICD-10-CM

## 2023-09-04 DIAGNOSIS — I4821 Permanent atrial fibrillation: Secondary | ICD-10-CM | POA: Diagnosis not present

## 2023-09-04 DIAGNOSIS — R911 Solitary pulmonary nodule: Secondary | ICD-10-CM | POA: Diagnosis present

## 2023-09-04 DIAGNOSIS — Z79899 Other long term (current) drug therapy: Secondary | ICD-10-CM

## 2023-09-04 DIAGNOSIS — I4891 Unspecified atrial fibrillation: Secondary | ICD-10-CM | POA: Diagnosis present

## 2023-09-04 DIAGNOSIS — N179 Acute kidney failure, unspecified: Secondary | ICD-10-CM

## 2023-09-04 DIAGNOSIS — I2729 Other secondary pulmonary hypertension: Secondary | ICD-10-CM | POA: Diagnosis present

## 2023-09-04 DIAGNOSIS — E785 Hyperlipidemia, unspecified: Secondary | ICD-10-CM | POA: Diagnosis present

## 2023-09-04 DIAGNOSIS — Z23 Encounter for immunization: Secondary | ICD-10-CM

## 2023-09-04 DIAGNOSIS — R0603 Acute respiratory distress: Secondary | ICD-10-CM | POA: Diagnosis present

## 2023-09-04 DIAGNOSIS — I050 Rheumatic mitral stenosis: Secondary | ICD-10-CM | POA: Diagnosis present

## 2023-09-04 DIAGNOSIS — R809 Proteinuria, unspecified: Secondary | ICD-10-CM

## 2023-09-04 DIAGNOSIS — Z5986 Financial insecurity: Secondary | ICD-10-CM

## 2023-09-04 DIAGNOSIS — J9621 Acute and chronic respiratory failure with hypoxia: Principal | ICD-10-CM | POA: Diagnosis present

## 2023-09-04 DIAGNOSIS — I5082 Biventricular heart failure: Secondary | ICD-10-CM | POA: Diagnosis present

## 2023-09-04 DIAGNOSIS — Z8673 Personal history of transient ischemic attack (TIA), and cerebral infarction without residual deficits: Secondary | ICD-10-CM

## 2023-09-04 DIAGNOSIS — R059 Cough, unspecified: Secondary | ICD-10-CM | POA: Diagnosis present

## 2023-09-04 DIAGNOSIS — E119 Type 2 diabetes mellitus without complications: Secondary | ICD-10-CM

## 2023-09-04 DIAGNOSIS — E1129 Type 2 diabetes mellitus with other diabetic kidney complication: Secondary | ICD-10-CM | POA: Diagnosis not present

## 2023-09-04 DIAGNOSIS — E039 Hypothyroidism, unspecified: Secondary | ICD-10-CM | POA: Diagnosis present

## 2023-09-04 DIAGNOSIS — I2510 Atherosclerotic heart disease of native coronary artery without angina pectoris: Secondary | ICD-10-CM | POA: Diagnosis present

## 2023-09-04 DIAGNOSIS — R000 Tachycardia, unspecified: Secondary | ICD-10-CM | POA: Diagnosis present

## 2023-09-04 DIAGNOSIS — Z5982 Transportation insecurity: Secondary | ICD-10-CM

## 2023-09-04 DIAGNOSIS — J180 Bronchopneumonia, unspecified organism: Principal | ICD-10-CM | POA: Diagnosis present

## 2023-09-04 DIAGNOSIS — Z603 Acculturation difficulty: Secondary | ICD-10-CM | POA: Diagnosis present

## 2023-09-04 DIAGNOSIS — J209 Acute bronchitis, unspecified: Secondary | ICD-10-CM | POA: Diagnosis present

## 2023-09-04 DIAGNOSIS — Z7982 Long term (current) use of aspirin: Secondary | ICD-10-CM

## 2023-09-04 DIAGNOSIS — R001 Bradycardia, unspecified: Secondary | ICD-10-CM | POA: Diagnosis not present

## 2023-09-04 DIAGNOSIS — Z955 Presence of coronary angioplasty implant and graft: Secondary | ICD-10-CM

## 2023-09-04 DIAGNOSIS — I110 Hypertensive heart disease with heart failure: Secondary | ICD-10-CM | POA: Diagnosis present

## 2023-09-04 DIAGNOSIS — R0609 Other forms of dyspnea: Secondary | ICD-10-CM | POA: Diagnosis present

## 2023-09-04 DIAGNOSIS — R6883 Chills (without fever): Secondary | ICD-10-CM | POA: Diagnosis not present

## 2023-09-04 DIAGNOSIS — I342 Nonrheumatic mitral (valve) stenosis: Secondary | ICD-10-CM | POA: Diagnosis not present

## 2023-09-04 DIAGNOSIS — Z5941 Food insecurity: Secondary | ICD-10-CM

## 2023-09-04 DIAGNOSIS — Z79890 Hormone replacement therapy (postmenopausal): Secondary | ICD-10-CM

## 2023-09-04 DIAGNOSIS — Z555 Less than a high school diploma: Secondary | ICD-10-CM

## 2023-09-04 DIAGNOSIS — I5031 Acute diastolic (congestive) heart failure: Secondary | ICD-10-CM | POA: Diagnosis present

## 2023-09-04 DIAGNOSIS — Z1152 Encounter for screening for COVID-19: Secondary | ICD-10-CM

## 2023-09-04 LAB — RESPIRATORY PANEL BY PCR

## 2023-09-04 LAB — TROPONIN I (HIGH SENSITIVITY)
Troponin I (High Sensitivity): 8 ng/L (ref <18)
Troponin I (High Sensitivity): 8 ng/L (ref <18)

## 2023-09-04 LAB — PROTIME-INR
INR: 2.9 — ABNORMAL HIGH (ref 0.8–1.2)
Prothrombin Time: 30.6 s — ABNORMAL HIGH (ref 11.4–15.2)

## 2023-09-04 LAB — RESP PANEL BY RT-PCR (RSV, FLU A&B, COVID)  RVPGX2
Influenza A by PCR: NEGATIVE
Influenza B by PCR: NEGATIVE
Resp Syncytial Virus by PCR: NEGATIVE
SARS Coronavirus 2 by RT PCR: NEGATIVE

## 2023-09-04 LAB — MAGNESIUM: Magnesium: 2 mg/dL (ref 1.7–2.4)

## 2023-09-04 LAB — CBC
HCT: 41.6 % (ref 36.0–46.0)
Hemoglobin: 13.3 g/dL (ref 12.0–15.0)
MCH: 28.7 pg (ref 26.0–34.0)
MCHC: 32 g/dL (ref 30.0–36.0)
MCV: 89.8 fL (ref 80.0–100.0)
Platelets: 188 10*3/uL (ref 150–400)
RBC: 4.63 MIL/uL (ref 3.87–5.11)
RDW: 13.7 % (ref 11.5–15.5)
WBC: 12.8 10*3/uL — ABNORMAL HIGH (ref 4.0–10.5)
nRBC: 0 % (ref 0.0–0.2)

## 2023-09-04 LAB — BASIC METABOLIC PANEL
Anion gap: 11 (ref 5–15)
BUN: 21 mg/dL (ref 8–23)
CO2: 23 mmol/L (ref 22–32)
Calcium: 9.7 mg/dL (ref 8.9–10.3)
Chloride: 104 mmol/L (ref 98–111)
Creatinine, Ser: 0.8 mg/dL (ref 0.44–1.00)
GFR, Estimated: >60 mL/min (ref >60)
Glucose, Bld: 140 mg/dL — ABNORMAL HIGH (ref 70–99)
Potassium: 4 mmol/L (ref 3.5–5.1)
Sodium: 138 mmol/L (ref 135–145)

## 2023-09-04 LAB — URINALYSIS, W/ REFLEX TO CULTURE (INFECTION SUSPECTED)
Bilirubin Urine: NEGATIVE
Glucose, UA: NEGATIVE mg/dL
Ketones, ur: NEGATIVE mg/dL
Leukocytes,Ua: NEGATIVE
Nitrite: NEGATIVE
Protein, ur: NEGATIVE mg/dL
Specific Gravity, Urine: 1.02 (ref 1.005–1.030)
pH: 5 (ref 5.0–8.0)

## 2023-09-04 LAB — HIV ANTIBODY (ROUTINE TESTING W REFLEX): HIV Screen 4th Generation wRfx: NONREACTIVE

## 2023-09-04 LAB — ECHOCARDIOGRAM COMPLETE
Height: 59 in
MV VTI: 0.7 cm2
S' Lateral: 2.3 cm
Weight: 1760 [oz_av]

## 2023-09-04 LAB — PROCALCITONIN: Procalcitonin: <0.1 ng/mL

## 2023-09-04 LAB — BRAIN NATRIURETIC PEPTIDE: B Natriuretic Peptide: 283.1 pg/mL — ABNORMAL HIGH (ref 0.0–100.0)

## 2023-09-04 MED ORDER — ALBUTEROL SULFATE (2.5 MG/3ML) 0.083% IN NEBU
2.5000 mg | INHALATION_SOLUTION | RESPIRATORY_TRACT | Status: DC | PRN
  Administered 2023-09-06 – 2023-09-09 (×2): 2.5 mg via RESPIRATORY_TRACT
  Filled 2023-09-04 (×2): qty 3

## 2023-09-04 MED ORDER — POLYETHYLENE GLYCOL 3350 17 G PO PACK: 17.0000 g | PACK | Freq: Every day | ORAL | Status: DC | PRN

## 2023-09-04 MED ORDER — GABAPENTIN 100 MG PO CAPS
100.0000 mg | ORAL_CAPSULE | Freq: Every day | ORAL | Status: DC
  Administered 2023-09-04 – 2023-09-12 (×9): 100 mg via ORAL
  Filled 2023-09-04 (×9): qty 1

## 2023-09-04 MED ORDER — MAGNESIUM SULFATE 2 GM/50ML IV SOLN
2.0000 g | Freq: Once | INTRAVENOUS | Status: AC
  Administered 2023-09-04: 2 g via INTRAVENOUS
  Filled 2023-09-04: qty 50

## 2023-09-04 MED ORDER — WARFARIN SODIUM 2.5 MG PO TABS
3.7500 mg | ORAL_TABLET | ORAL | Status: DC
  Administered 2023-09-05: 3.75 mg via ORAL
  Filled 2023-09-04: qty 1

## 2023-09-04 MED ORDER — IPRATROPIUM BROMIDE 0.02 % IN SOLN
0.5000 mg | Freq: Four times a day (QID) | RESPIRATORY_TRACT | Status: DC
  Administered 2023-09-04 – 2023-09-05 (×5): 0.5 mg via RESPIRATORY_TRACT
  Filled 2023-09-04 (×5): qty 2.5

## 2023-09-04 MED ORDER — METOPROLOL SUCCINATE ER 50 MG PO TB24
75.0000 mg | ORAL_TABLET | Freq: Every day | ORAL | Status: DC
  Administered 2023-09-05: 75 mg via ORAL
  Filled 2023-09-04: qty 3

## 2023-09-04 MED ORDER — LEVOTHYROXINE SODIUM 50 MCG PO TABS
50.0000 ug | ORAL_TABLET | Freq: Every day | ORAL | Status: DC
  Administered 2023-09-05 – 2023-09-13 (×8): 50 ug via ORAL
  Filled 2023-09-04: qty 2
  Filled 2023-09-04 (×7): qty 1

## 2023-09-04 MED ORDER — ALBUTEROL SULFATE (2.5 MG/3ML) 0.083% IN NEBU
INHALATION_SOLUTION | RESPIRATORY_TRACT | Status: AC
  Filled 2023-09-04: qty 12

## 2023-09-04 MED ORDER — WARFARIN - PHARMACIST DOSING INPATIENT: Freq: Every day | Status: DC

## 2023-09-04 MED ORDER — WARFARIN SODIUM 5 MG PO TABS
5.0000 mg | ORAL_TABLET | ORAL | Status: DC
  Administered 2023-09-04: 5 mg via ORAL
  Filled 2023-09-04 (×2): qty 1

## 2023-09-04 MED ORDER — ACETAMINOPHEN 325 MG PO TABS
650.0000 mg | ORAL_TABLET | Freq: Four times a day (QID) | ORAL | Status: DC | PRN
  Administered 2023-09-05: 650 mg via ORAL
  Filled 2023-09-04: qty 2

## 2023-09-04 MED ORDER — FUROSEMIDE 10 MG/ML IJ SOLN
40.0000 mg | Freq: Once | INTRAMUSCULAR | Status: AC
  Administered 2023-09-04: 40 mg via INTRAVENOUS
  Filled 2023-09-04: qty 4

## 2023-09-04 MED ORDER — ALBUTEROL SULFATE (2.5 MG/3ML) 0.083% IN NEBU
10.0000 mg/h | INHALATION_SOLUTION | Freq: Once | RESPIRATORY_TRACT | Status: AC
  Administered 2023-09-04: 10 mg/h via RESPIRATORY_TRACT
  Filled 2023-09-04: qty 12

## 2023-09-04 MED ORDER — METHYLPREDNISOLONE SODIUM SUCC 125 MG IJ SOLR
125.0000 mg | Freq: Once | INTRAMUSCULAR | Status: AC
  Administered 2023-09-04: 125 mg via INTRAVENOUS
  Filled 2023-09-04: qty 2

## 2023-09-04 MED ORDER — LEVETIRACETAM 250 MG PO TABS
250.0000 mg | ORAL_TABLET | Freq: Two times a day (BID) | ORAL | Status: DC
  Administered 2023-09-04 – 2023-09-13 (×18): 250 mg via ORAL
  Filled 2023-09-04 (×18): qty 1

## 2023-09-04 MED ORDER — ATORVASTATIN CALCIUM 80 MG PO TABS
80.0000 mg | ORAL_TABLET | Freq: Every day | ORAL | Status: DC
  Administered 2023-09-05 – 2023-09-13 (×9): 80 mg via ORAL
  Filled 2023-09-04 (×4): qty 1
  Filled 2023-09-04: qty 2
  Filled 2023-09-04 (×4): qty 1

## 2023-09-04 MED ORDER — GUAIFENESIN 100 MG/5ML PO LIQD
5.0000 mL | ORAL | Status: DC | PRN
  Administered 2023-09-04 – 2023-09-08 (×3): 5 mL via ORAL
  Filled 2023-09-04 (×3): qty 10

## 2023-09-04 MED ORDER — ACETAMINOPHEN 650 MG RE SUPP: 650.0000 mg | Freq: Four times a day (QID) | RECTAL | Status: DC | PRN

## 2023-09-04 MED ORDER — ALBUTEROL (5 MG/ML) CONTINUOUS INHALATION SOLN
10.0000 mg/h | INHALATION_SOLUTION | RESPIRATORY_TRACT | Status: DC
  Administered 2023-09-04: 10 mg/h via RESPIRATORY_TRACT
  Filled 2023-09-04: qty 20

## 2023-09-04 MED ORDER — SODIUM CHLORIDE 0.9% FLUSH
3.0000 mL | Freq: Two times a day (BID) | INTRAVENOUS | Status: DC
  Administered 2023-09-04 – 2023-09-12 (×15): 3 mL via INTRAVENOUS

## 2023-09-04 MED ORDER — INSULIN ASPART 100 UNIT/ML IJ SOLN: 0.0000 [IU] | Freq: Three times a day (TID) | INTRAMUSCULAR | Status: DC

## 2023-09-04 MED ORDER — ALBUTEROL SULFATE (2.5 MG/3ML) 0.083% IN NEBU
2.5000 mg | INHALATION_SOLUTION | Freq: Once | RESPIRATORY_TRACT | Status: AC
  Administered 2023-09-04: 2.5 mg via RESPIRATORY_TRACT
  Filled 2023-09-04: qty 3

## 2023-09-04 MED ORDER — MOMETASONE FURO-FORMOTEROL FUM 100-5 MCG/ACT IN AERO
2.0000 | INHALATION_SPRAY | Freq: Two times a day (BID) | RESPIRATORY_TRACT | Status: DC
  Administered 2023-09-05 – 2023-09-09 (×9): 2 via RESPIRATORY_TRACT
  Filled 2023-09-04: qty 8.8

## 2023-09-04 MED ORDER — PREDNISONE 20 MG PO TABS
50.0000 mg | ORAL_TABLET | Freq: Every day | ORAL | Status: DC
  Administered 2023-09-05 – 2023-09-07 (×3): 50 mg via ORAL
  Filled 2023-09-04 (×2): qty 1
  Filled 2023-09-04: qty 2

## 2023-09-04 NOTE — Progress Notes (Signed)
PHARMACY - ANTICOAGULATION CONSULT NOTE  Pharmacy Consult for warfarin Indication: atrial fibrillation  No Known Allergies  Patient Measurements: Height: 4\' 11"  (149.9 cm) Weight: 49.9 kg (110 lb) IBW/kg (Calculated) : 43.2  Vital Signs: Temp: 99 F (37.2 C) (11/11 1648) Temp Source: Oral (11/11 1648) BP: 126/69 (11/11 1648) Pulse Rate: 111 (11/11 1703)  Labs: Recent Labs    09/04/23 1144 09/04/23 1300 09/04/23 1335  HGB 13.3  --   --   HCT 41.6  --   --   PLT 188  --   --   LABPROT  --  30.6*  --   INR  --  2.9*  --   CREATININE 0.80  --   --   TROPONINIHS 8  --  8    Estimated Creatinine Clearance: 48.5 mL/min (by C-G formula based on SCr of 0.8 mg/dL).   Medical History: Past Medical History:  Diagnosis Date   Atrial fibrillation (HCC)    Diabetes mellitus without complication (HCC)    Stroke [facility])    Thyroid disease    Assessment: 62yoF on chronic warfarin, INR goal of 2.5-3.0 per outpatient anticoag note for Afib. Home dose of 5 mg Mon, Fri, and 3.75 AOD. Noted regimen in med rec is different, discussed with patient's son at bedside and he confirmed 5 mg/3.75mg  regimen. Today, INR therapeutic at 2.9.   Goal of Therapy:  INR 2.5 - 3.0 Monitor platelets by anticoagulation protocol: Yes   Plan:  Continue home regimen (5 mg MF, 3.75 mg AOD) Monitor INR and CBC daily and PRN  Rutherford Nail, PharmD PGY2 Critical Care Pharmacy Resident 09/04/2023,6:14 PM

## 2023-09-04 NOTE — ED Triage Notes (Signed)
Gujarati interpreter used for triage [phone]  Pt reports continued cough with chest pain and sob, fever. Ongoing for 2 months Pt seen on 11/8 by PCP for the same, covid/flu test negative, pt also had xray done.

## 2023-09-04 NOTE — ED Notes (Signed)
Patient brief changed and bedside commode placed with bedpan.

## 2023-09-04 NOTE — Progress Notes (Signed)
  Echocardiogram 2D Echocardiogram has been performed.  Christy Poole 09/04/2023, 5:59 PM

## 2023-09-04 NOTE — Consult Note (Signed)
Cardiology Consultation   Patient ID: Christy Poole MRN: [id number]; DOB: 07-30-1961  Admit date: 09/04/2023 Date of Consult: 09/04/2023  PCP:  [place], Buren Kos, MD   [facility] Providers Cardiologist:  Jodelle Red, MD        Patient Profile:   Christy Poole is a 62 y.o. female with a hx of hyperlipidemia, hypothyroidism, stroke, diabetes, permanent valvular atrial fibrillation on Coumadin follows with our Coumadin clinic, rheumatic mitral stenosis status post valvuloplasty, history of CVA/TIA, who is being seen 09/04/2023 for the evaluation of shortness of breath at the request of Dr. Alinda Money.  History of Present Illness:   [name] follows with Dr. Cristal Deer and was last seen by her in August 2024 at which time she appeared to be stable from a cardiovascular standpoint.  Patient speaks Gujarati therefore the visit was facilitated by a translator (she preferred her family member to translate).  Per the patient and her family member she had been experiencing worsening muscle aches and pains with headache and some control over the last several days.  Over the last 2 days he progressed to coughing, subjective fevers as well as runny nose.  During that time she did not take anything for cold hoping that it would pass.  But noted that she had no appetite and had not been eating as well.  With persistence of the fever her family encouraged the patient to come to be evaluated in the ED.  While being evaluated in the ED she was noted to have increased respiratory rate 20s to 30s, however oxygen saturations within normal limits and she was also tachycardic.  Lab work done in the ED showed cytosis white blood count 12.8, troponin negative and INR within goal 2.9.  She had a chest x-ray which showed interstitial markings favoring likely pulmonary edema with suspected superimposed pneumonitis.  With this cardiology has been consulted to help with the management of the  patient.   Past Medical History:  Diagnosis Date   Atrial fibrillation (HCC)    Diabetes mellitus without complication (HCC)    Stroke [facility])    Thyroid disease     History reviewed. No pertinent surgical history.     Inpatient Medications: Scheduled Meds:  albuterol       [START ON 09/05/2023] atorvastatin  80 mg Oral Daily   furosemide  40 mg Intravenous Once   gabapentin  100 mg Oral QHS   [START ON 09/05/2023] insulin aspart  0-9 Units Subcutaneous TID WC   ipratropium  0.5 mg Nebulization Q6H WA   levETIRAcetam  250 mg Oral BID   [START ON 09/05/2023] levothyroxine  50 mcg Oral Q0600   [START ON 09/05/2023] metoprolol succinate  75 mg Oral Daily   mometasone-formoterol  2 puff Inhalation BID   [START ON 09/05/2023] predniSONE  50 mg Oral Q breakfast   sodium chloride flush  3 mL Intravenous Q12H   [START ON 09/05/2023] warfarin  3.75 mg Oral Once per day on Sunday Tuesday Wednesday Thursday Saturday   warfarin  5 mg Oral Once per day on Monday Friday   [START ON 09/05/2023] Warfarin - Pharmacist Dosing Inpatient   Does not apply q1600   Continuous Infusions:  PRN Meds: acetaminophen **OR** acetaminophen, albuterol, albuterol, guaiFENesin, polyethylene glycol  Allergies:   No Known Allergies  Social History:   Social History   Socioeconomic History   Marital status: Widowed    Spouse name: Not on file   Number of children:  Not on file   Years of education: Not on file   Highest education level: 10th grade  Occupational History   Not on file  Tobacco Use   Smoking status: Never   Smokeless tobacco: Never  Vaping Use   Vaping status: Never Used  Substance and Sexual Activity   Alcohol use: Never   Drug use: Never   Sexual activity: Never  Other Topics Concern   Not on file  Social History Narrative   Not on file   Social Determinants of Health   Financial Resource Strain: High Risk (08/30/2023)   Overall Financial Resource Strain (CARDIA)     Difficulty of Paying Living Expenses: Hard  Food Insecurity: Food Insecurity Present (08/30/2023)   Hunger Vital Sign    Worried About Programme researcher, broadcasting/film/video in the Last Year: Often true    Ran Out of Food in the Last Year: Often true  Transportation Needs: Unmet Transportation Needs (08/30/2023)   PRAPARE - Administrator, Civil Service (Medical): Yes    Lack of Transportation (Non-Medical): Yes  Physical Activity: Inactive (08/30/2023)   Exercise Vital Sign    Days of Exercise per Week: 0 days    Minutes of Exercise per Session: 10 min  Stress: Stress Concern Present (08/30/2023)   [employer] of Occupational Health - Occupational Stress Questionnaire    Feeling of Stress : Very much  Social Connections: Socially Isolated (08/30/2023)   Social Connection and Isolation Panel [NHANES]    Frequency of Communication with Friends and Family: Once a week    Frequency of Social Gatherings with Friends and Family: Three times a week    Attends Religious Services: Never    Active Member of Clubs or Organizations: No    Attends Banker Meetings: Not on file    Marital Status: Widowed  Intimate Partner Violence: Not on file    Family History:   History reviewed. No pertinent family history.   ROS:  Please see the history of present illness.  Shortness of breath, wheezing All other ROS reviewed and negative.     Physical Exam/Data:   Vitals:   09/04/23 1545 09/04/23 1641 09/04/23 1648 09/04/23 1703  BP: 105/79  126/69   Pulse: (!) 117 (!) 109 (!) 119 (!) 111  Resp: (!) 29 (!) 26 19 (!) 28  Temp:   99 F (37.2 C)   TempSrc:   Oral   SpO2: 94% 91% 100% 100%  Weight:      Height:        Intake/Output Summary (Last 24 hours) at 09/04/2023 1932 Last data filed at 09/04/2023 1437 Gross per 24 hour  Intake 47.59 ml  Output --  Net 47.59 ml      09/04/2023   11:56 AM 07/12/2023   12:47 PM 05/25/2023    7:59 AM  Last 3 Weights  Weight (lbs) 110 lb 111 lb  110 lb 6.4 oz  Weight (kg) 49.896 kg 50.349 kg 50.077 kg     Body mass index is 22.22 kg/m.  General:  Well nourished, well developed, in no acute distress HEENT: normal Neck: no JVD Vascular: No carotid bruits; Distal pulses 2+ bilaterally Cardiac:  normal S1, S2; RRR; no murmur  Lungs:  bibasilar crackles with diffuse wheezing Abd: soft, nontender, no hepatomegaly  Ext: no edema Musculoskeletal:  No deformities, BUE and BLE strength normal and equal Skin: warm and dry  Neuro:  CNs 2-12 intact, no focal abnormalities noted Psych:  Normal affect   EKG:  The EKG was personally reviewed and demonstrates: Atrial fibrillation with rapid ventricular rate  Telemetry:  Telemetry was personally reviewed and demonstrates:  atrial fibrillation   Relevant CV Studies:  TTE 09/04/2023  1. Left ventricular ejection fraction, by estimation, is 55 to 60%. The left ventricle has normal function. The left ventricle has no regional wall motion abnormalities. indeterminate due to atrial fibrillation. There is the interventricular septum is flattened in systole, consistent with right ventricular pressure overload.   2. Right ventricular systolic function is mildly reduced. The right ventricular size is normal. There is normal pulmonary artery systolic pressure.   3. Left atrial size was severely dilated.   4. Difficult to assess given atrial fibrillation with rapid rate but  appears MS is severe by mean gradient ( ) and by PHT (1.2-1.4cm^2).  The mitral valve is rheumatic. Trivial mitral valve regurgitation. Severe  mitral stenosis. The mean mitral  valve gradient is 11.2 mmHg.   5. The aortic valve is tricuspid. Aortic valve regurgitation is not  visualized.   6. The inferior vena cava is normal in size with <50% respiratory  variability, suggesting right atrial pressure of 8 mmHg.   FINDINGS   Left Ventricle: Left ventricular ejection fraction, by estimation, is 55  to 60%. The left  ventricle has normal function. The left ventricle has no  regional wall motion abnormalities. The left ventricular internal cavity  size was small. There is no left  ventricular hypertrophy. The interventricular septum is flattened in  systole, consistent with right ventricular pressure overload.  Indeterminate due to atrial fibrillation.   Right Ventricle: The right ventricular size is normal. No increase in  right ventricular wall thickness. Right ventricular systolic function is  mildly reduced. There is normal pulmonary artery systolic pressure. The  tricuspid regurgitant velocity is 2.51  m/s, and with an assumed right atrial pressure of 3 mmHg, the estimated  right ventricular systolic pressure is 28.2 mmHg.   Left Atrium: Left atrial size was severely dilated.   Right Atrium: Right atrial size was normal in size.   Pericardium: There is no evidence of pericardial effusion.   Mitral Valve: Difficult to assess given atrial fibrillation with rapid  rate but appears MS is severe by mean gradient ( ) and by PHT (1.2-1.4cm^2). The mitral valve is rheumatic. There is mild thickening of  the mitral valve leaflet(s). There is  moderate calcification of the posterior mitral valve leaflet(s).  Moderately decreased mobility of the mitral valve leaflets. Trivial mitral  valve regurgitation. Severe mitral valve stenosis. MV peak gradient, 21.6 mmHg. The mean mitral valve gradient is 11.2 mmHg.   Tricuspid Valve: The tricuspid valve is normal in structure. Tricuspid valve regurgitation is mild.   Aortic Valve: The aortic valve is tricuspid. Aortic valve regurgitation is not visualized.   Pulmonic Valve: The pulmonic valve was grossly normal. Pulmonic valve regurgitation is not visualized.   Aorta: The aortic root and ascending aorta are structurally normal, with no evidence of dilitation.   Venous: The inferior vena cava is normal in size with less than 50% respiratory variability,  suggesting right atrial pressure of 8 mmHg.   IAS/Shunts: The interatrial septum was not well visualized.   Laboratory Data:  High Sensitivity Troponin:   Recent Labs  Lab 09/04/23 1144 09/04/23 1335  TROPONINIHS 8 8     Chemistry Recent Labs  Lab 09/04/23 1144 09/04/23 1335  NA 138  --   K 4.0  --  CL 104  --   CO2 23  --   GLUCOSE 140*  --   BUN 21  --   CREATININE 0.80  --   CALCIUM 9.7  --   MG  --  2.0  GFRNONAA >60  --   ANIONGAP 11  --     No results for input(s): "PROT", "ALBUMIN", "AST", "ALT", "ALKPHOS", "BILITOT" in the last 168 hours. Lipids No results for input(s): "CHOL", "TRIG", "HDL", "LABVLDL", "LDLCALC", "CHOLHDL" in the last 168 hours.  Hematology Recent Labs  Lab 09/04/23 1144  WBC 12.8*  RBC 4.63  HGB 13.3  HCT 41.6  MCV 89.8  MCH 28.7  MCHC 32.0  RDW 13.7  PLT 188   Thyroid No results for input(s): "TSH", "FREET4" in the last 168 hours.  BNP Recent Labs  Lab 09/04/23 1330  BNP 283.1*    DDimer No results for input(s): "DDIMER" in the last 168 hours.   Radiology/Studies:  ECHOCARDIOGRAM COMPLETE  Result Date: 09/04/2023    ECHOCARDIOGRAM REPORT   [name] Date of Exam: 09/04/2023 Medical Rec #:  [id number]       Height:       59.0 in Accession #:    [id number]      Weight:       110.0 lb Date of Birth:  December 12, 1960       BSA:          1.431 m Patient Age:    62 years        BP:           126/69 mmHg Patient Gender: F               HR:           118 bpm. Exam Location:  Inpatient Procedure: 2D Echo, Color Doppler and Cardiac Doppler STAT ECHO Indications:     mitral stenosis  History:         Patient has prior history of Echocardiogram examinations, most                  recent 06/20/2022. History of stroke, Arrythmias:Atrial                  Fibrillation, Signs/Symptoms:Dyspnea; Risk Factors:Dyslipidemia                  and Diabetes.  Sonographer:     Delcie Roch RDCS Referring Phys:  [id number] Cecille Po  MELVIN Diagnosing Phys: Clearnce Hasten  Sonographer Comments: Image acquisition challenging due to respiratory motion. IMPRESSIONS  1. Left ventricular ejection fraction, by estimation, is 55 to 60%. The left ventricle has normal function. The left ventricle has no regional wall motion abnormalities. indeterminate due to atrial fibrillation. There is the interventricular septum is flattened in systole, consistent with right ventricular pressure overload.  2. Right ventricular systolic function is mildly reduced. The right ventricular size is normal. There is normal pulmonary artery systolic pressure.  3. Left atrial size was severely dilated.  4. Difficult to assess given atrial fibrillation with rapid rate but appears MS is severe by mean gradient ( ) and by PHT (1.2-1.4cm^2). The mitral valve is rheumatic. Trivial mitral valve regurgitation. Severe mitral stenosis. The mean mitral valve gradient is 11.2 mmHg.  5. The aortic valve is tricuspid. Aortic valve regurgitation is not visualized.  6. The inferior vena cava is normal in size with <50% respiratory variability, suggesting right atrial pressure of 8 mmHg. FINDINGS  Left Ventricle: Left ventricular ejection fraction, by estimation, is 55 to 60%. The left ventricle has normal function. The left ventricle has no regional wall motion abnormalities. The left ventricular internal cavity size was small. There is no left ventricular hypertrophy. The interventricular septum is flattened in systole, consistent with right ventricular pressure overload. Indeterminate due to atrial fibrillation. Right Ventricle: The right ventricular size is normal. No increase in right ventricular wall thickness. Right ventricular systolic function is mildly reduced. There is normal pulmonary artery systolic pressure. The tricuspid regurgitant velocity is 2.51 m/s, and with an assumed right atrial pressure of 3 mmHg, the estimated right ventricular systolic pressure is 28.2 mmHg.  Left Atrium: Left atrial size was severely dilated. Right Atrium: Right atrial size was normal in size. Pericardium: There is no evidence of pericardial effusion. Mitral Valve: Difficult to assess given atrial fibrillation with rapid rate but appears MS is severe by mean gradient ( ) and by PHT (1.2-1.4cm^2). The mitral valve is rheumatic. There is mild thickening of the mitral valve leaflet(s). There is moderate calcification of the posterior mitral valve leaflet(s). Moderately decreased mobility of the mitral valve leaflets. Trivial mitral valve regurgitation. Severe mitral valve stenosis. MV peak gradient, 21.6 mmHg. The mean mitral valve gradient is 11.2 mmHg. Tricuspid Valve: The tricuspid valve is normal in structure. Tricuspid valve regurgitation is mild. Aortic Valve: The aortic valve is tricuspid. Aortic valve regurgitation is not visualized. Pulmonic Valve: The pulmonic valve was grossly normal. Pulmonic valve regurgitation is not visualized. Aorta: The aortic root and ascending aorta are structurally normal, with no evidence of dilitation. Venous: The inferior vena cava is normal in size with less than 50% respiratory variability, suggesting right atrial pressure of 8 mmHg. IAS/Shunts: The interatrial septum was not well visualized.  LEFT VENTRICLE PLAX 2D LVIDd:         3.30 cm LVIDs:         2.30 cm LV PW:         0.80 cm LV IVS:        0.70 cm LVOT diam:     1.55 cm LV SV:         33 LV SV Index:   23 LVOT Area:     1.89 cm  RIGHT VENTRICLE          IVC RV Basal diam:  1.70 cm  IVC diam: 1.90 cm LEFT ATRIUM             Index        RIGHT ATRIUM           Index LA diam:        3.50 cm 2.45 cm/m   RA Area:     11.80 cm LA Vol (A2C):   43.2 ml 30.20 ml/m  RA Volume:   23.20 ml  16.22 ml/m LA Vol (A4C):   38.6 ml 26.98 ml/m LA Biplane Vol: 41.9 ml 29.29 ml/m  AORTIC VALVE LVOT Vmax:   101.23 cm/s LVOT Vmean:  68.533 cm/s LVOT VTI:    0.173 m  AORTA Ao Root diam: 2.40 cm Ao Asc diam:  2.80 cm  MITRAL VALVE              TRICUSPID VALVE MV Area VTI:  0.70 cm    TR Peak grad:   25.2 mmHg MV Peak grad: 21.6 mmHg   TR Vmax:        251.00 cm/s MV Mean grad: 11.2 mmHg MV Vmax:  2.32 m/s    SHUNTS MV Vmean:     157.3 cm/s  Systemic VTI:  0.17 m                           Systemic Diam: 1.55 cm Clearnce Hasten Electronically signed by Clearnce Hasten Signature Date/Time: 09/04/2023/6:23:56 PM    Final (Updated)    DG Chest 2 View  Result Date: 09/04/2023 CLINICAL DATA:  Chest pain.  Shortness of breath.  Cough. EXAM: CHEST - 2 VIEW COMPARISON:  08/30/2023. FINDINGS: There are increased interstitial markings with bilateral hilar and bibasilar predominance. There is no significant interval change since the recent prior chest radiograph. There is mild peribronchial cuffing. No acute consolidation or lung collapse. Bilateral costophrenic angles are clear. Stable mildly enlarged cardio-mediastinal silhouette. No acute osseous abnormalities. The soft tissues are within normal limits. IMPRESSION: *Increased interstitial markings throughout bilateral lungs with bilateral hilar and bibasilar predominance. Findings are favored to represent mild congestive heart failure/pulmonary edema. However, superimposed pneumonitis overlying the bilateral lower lung zones can not be excluded. Correlate clinically. No acute dense consolidation or lung collapse. Electronically Signed   By: Jules Schick M.D.   On: 09/04/2023 15:47     Assessment and Plan:   This is a 62 year old woman with shortness of breath with suspected multifactorial etiologies.  Acute Respiratory failure  Acute Diastolic heart failure  Severe mitral stenosis  Permanent atrial fibrillation - now rapid ventricular rate  Hx of CVA  Type 2 Diabetes Mellitus  Dyslipidemia   With her acute respiratory failure which could be multifactorial she is wheezing at work as well so I do believe strongly that chronic bronchitis is the primary etiology here  giving her recent viral prodrome.  Agree with the prednisone and inhalers.  She also does have some superimposed pulmonary edema with bibasilar crackles on physical exam will challenge the patient with a dose of Lasix overnight and see if that would help with her symptoms but clinically she does not appear overwhelmingly fluid overloaded.  Recent echo done today is showing progression of her mitral valve disease now noted severe mitral stenosis we will recommend adjusting her acuities which is her acute respiratory failure and then we can focus on the mitral valve.  Please continue with her Coumadin in the setting of her valvular heart disease/permanent atrial fibrillation.  She would benefit from pharmacy consultation to follow her INRs dosing of her Coumadin.  Her heart rate is in the low 100s which is being precipitated by her respiratory failure.  She has permanent atrial fibrillation which is continue treatment for respiratory failure and hoping that we have her respiratory status improve heart rate will also improve.  Diabetes mellitus will be addressed by the primary team.   Risk Assessment/Risk Scores:          CHA2DS2-VASc Score = 6   This indicates a 9.7% annual risk of stroke. The patient's score is based upon: CHF History: 0 HTN History: 1 Diabetes History: 1 Stroke History: 2 Vascular Disease History: 1 Age Score: 0 Gender Score: 1         For questions or updates, please contact [facility] Please consult [email or web address] for contact info under    Signed, Thomasene Ripple, DO  09/04/2023 7:32 PM

## 2023-09-04 NOTE — ED Provider Notes (Addendum)
[place] EMERGENCY DEPARTMENT AT [facility] Provider Note   CSN: [id number] Arrival date & time: 09/04/23  1109     History  Chief Complaint  Patient presents with   Cough   Chest Pain   Shortness of Breath    Christy Poole is a 62 y.o. female past medical history significant for A-fib, hypothyroidism, dyslipidemia, diabetes presents today for cough with chest pain and shortness of breath x 2 months.  Patient also endorses fever, body aches, headache, congestion, sore throat, dysuria, frequency runny nose, and decreased oral intake.  Patient denies hematuria, nausea, vomiting, abdominal pain, or diarrhea.  Patient was seen on 11/8 by her PCP for the same symptoms COVID/flu test were negative.  Patient also had an x-ray done at this time.  Gujarati interpreter used for assessment [phone]   Cough Associated symptoms: chest pain, chills, fever, rhinorrhea, shortness of breath and sore throat   Chest Pain Associated symptoms: cough, fever and shortness of breath   Shortness of Breath Associated symptoms: chest pain, cough, fever and sore throat        Home Medications Prior to Admission medications   Medication Sig Start Date End Date Taking? Authorizing Provider  acetaminophen (TYLENOL) 500 MG tablet Take 1,000 mg by mouth every 6 (six) hours as needed for mild pain, headache or fever.   Yes [provider]  albuterol (VENTOLIN HFA) 108 (90 Base) MCG/ACT inhaler Inhale 1-2 puffs into the lungs every 4 (four) hours as needed for wheezing or shortness of breath. 11/18/22 06/10/24 Yes Luciano Cutter, MD  aspirin EC (ASPIRIN LOW DOSE) 81 MG tablet TAKE 1 TABLET (81 MG TOTAL) BY MOUTH DAILY. (SWALLOW WHOLE) 06/30/23  Yes Jodelle Red, MD  atorvastatin (LIPITOR) 80 MG tablet TAKE 1 TABLET (80 MG TOTAL) BY MOUTH DAILY. 06/30/23  Yes Jodelle Red, MD  benzonatate (TESSALON) 100 MG capsule Take 1 capsule (100 mg total) by mouth 2 (two) times daily  as needed for cough. 08/30/23  Yes de [place], Raymond J, MD  gabapentin (NEURONTIN) 100 MG capsule Take 1 capsule (100 mg total) by mouth at bedtime. 07/12/23  Yes Micki Riley, MD  levETIRAcetam (KEPPRA) 250 MG tablet TAKE 1 TABLET (250 MG TOTAL) BY MOUTH 2 (TWO) TIMES DAILY. 08/30/23  Yes Micki Riley, MD  levothyroxine (SYNTHROID) 50 MCG tablet TAKE 1 TABLET (50 MCG TOTAL) BY MOUTH AT BEDTIME. 07/03/23  Yes de [place], Raymond J, MD  metFORMIN (GLUCOPHAGE-XR) 500 MG 24 hr tablet Take 1 tablet (500 mg total) by mouth daily with breakfast. 02/17/23  Yes Alyson Reedy, FNP  metoprolol succinate (TOPROL-XL) 50 MG 24 hr tablet TAKE 1 & 1/2 TABLETS (75 MG TOTAL) BY MOUTH DAILY. 06/30/23  Yes Jodelle Red, MD  mometasone-formoterol [facility]) 100-5 MCG/ACT AERO Inhale 2 puffs into the lungs in the morning and at bedtime. 03/16/23  Yes Luciano Cutter, MD  warfarin (COUMADIN) 2.5 MG tablet TAKE 1-2 TABLETS DAILY OR AS PRESCRIBED BY COUMADIN CLINIC Patient taking differently: Take 2.5-3.75 mg by mouth See admin instructions. Take 3.75mg  (1.5 tablets) by mouth every Wednesday and Thursday, take 2.5mg  (1 tablet) by mouth daily on all other days. 05/26/23  Yes Jodelle Red, MD  Spacer/Aero-Holding Chambers (OPTICHAMBER DIAMOND-LG MASK) [facility] Use as directed. 03/16/23   Luciano Cutter, MD      Allergies    Patient has no known allergies.    Review of Systems   Review of Systems  Constitutional:  Positive for appetite change,  chills and fever.  HENT:  Positive for congestion, rhinorrhea and sore throat.   Respiratory:  Positive for cough and shortness of breath.   Cardiovascular:  Positive for chest pain.  Genitourinary:  Positive for dysuria and frequency.    Physical Exam Updated Vital Signs BP 126/69   Pulse (!) 111   Temp 99 F (37.2 C) (Oral)   Resp (!) 28   Ht 4\' 11"  (1.499 m)   Wt 49.9 kg   SpO2 100%   BMI 22.22 kg/m  Physical Exam Vitals and nursing note reviewed.   Constitutional:      General: She is not in acute distress.    Appearance: She is well-developed. She is ill-appearing.  HENT:     Head: Normocephalic and atraumatic.  Eyes:     Conjunctiva/sclera: Conjunctivae normal.     Pupils: Pupils are equal, round, and reactive to light.  Cardiovascular:     Rate and Rhythm: Regular rhythm. Tachycardia present.     Heart sounds: No murmur heard. Pulmonary:     Effort: Tachypnea and accessory muscle usage present. No respiratory distress.     Breath sounds: Examination of the right-upper field reveals wheezing. Examination of the left-upper field reveals wheezing. Examination of the right-middle field reveals wheezing. Examination of the left-middle field reveals wheezing. Examination of the right-lower field reveals wheezing. Examination of the left-lower field reveals wheezing. Wheezing present.  Abdominal:     Palpations: Abdomen is soft.     Tenderness: There is no abdominal tenderness.  Musculoskeletal:        General: No swelling.     Cervical back: Neck supple.  Skin:    General: Skin is warm and dry.     Capillary Refill: Capillary refill takes less than 2 seconds.  Neurological:     Mental Status: She is alert.  Psychiatric:        Mood and Affect: Mood normal.     ED Results / Procedures / Treatments   Labs (all labs ordered are listed, but only abnormal results are displayed) Labs Reviewed  BASIC METABOLIC PANEL - Abnormal; Notable for the following components:      Result Value   Glucose, Bld 140 (*)    All other components within normal limits  CBC - Abnormal; Notable for the following components:   WBC 12.8 (*)    All other components within normal limits  URINALYSIS, W/ REFLEX TO CULTURE (INFECTION SUSPECTED) - Abnormal; Notable for the following components:   Hgb urine dipstick MODERATE (*)    Bacteria, UA RARE (*)    All other components within normal limits  PROTIME-INR - Abnormal; Notable for the following  components:   Prothrombin Time 30.6 (*)    INR 2.9 (*)    All other components within normal limits  BRAIN NATRIURETIC PEPTIDE - Abnormal; Notable for the following components:   B Natriuretic Peptide 283.1 (*)    All other components within normal limits  RESP PANEL BY RT-PCR (RSV, FLU A&B, COVID)  RVPGX2  RESPIRATORY PANEL BY PCR  HIV ANTIBODY (ROUTINE TESTING W REFLEX)  COMPREHENSIVE METABOLIC PANEL  CBC  MAGNESIUM  PROCALCITONIN  PROCALCITONIN  TROPONIN I (HIGH SENSITIVITY)  TROPONIN I (HIGH SENSITIVITY)    EKG None  Radiology DG Chest 2 View  Result Date: 09/04/2023 CLINICAL DATA:  Chest pain.  Shortness of breath.  Cough. EXAM: CHEST - 2 VIEW COMPARISON:  08/30/2023. FINDINGS: There are increased interstitial markings with bilateral hilar and bibasilar predominance. There  is no significant interval change since the recent prior chest radiograph. There is mild peribronchial cuffing. No acute consolidation or lung collapse. Bilateral costophrenic angles are clear. Stable mildly enlarged cardio-mediastinal silhouette. No acute osseous abnormalities. The soft tissues are within normal limits. IMPRESSION: *Increased interstitial markings throughout bilateral lungs with bilateral hilar and bibasilar predominance. Findings are favored to represent mild congestive heart failure/pulmonary edema. However, superimposed pneumonitis overlying the bilateral lower lung zones can not be excluded. Correlate clinically. No acute dense consolidation or lung collapse. Electronically Signed   By: Jules Schick M.D.   On: 09/04/2023 15:47    Procedures Procedures    Medications Ordered in ED Medications  albuterol (PROVENTIL) (2.5 MG/3ML) 0.083% nebulizer solution (  Not Given 09/04/23 1414)  atorvastatin (LIPITOR) tablet 80 mg (has no administration in time range)  metoprolol succinate (TOPROL-XL) 24 hr tablet 75 mg (has no administration in time range)  levothyroxine (SYNTHROID) tablet 50 mcg  (has no administration in time range)  gabapentin (NEURONTIN) capsule 100 mg (has no administration in time range)  levETIRAcetam (KEPPRA) tablet 250 mg (has no administration in time range)  mometasone-formoterol (DULERA) 100-5 MCG/ACT inhaler 2 puff (has no administration in time range)  sodium chloride flush (NS) 0.9 % injection 3 mL (has no administration in time range)  acetaminophen (TYLENOL) tablet 650 mg (has no administration in time range)    Or  acetaminophen (TYLENOL) suppository 650 mg (has no administration in time range)  polyethylene glycol (MIRALAX / GLYCOLAX) packet 17 g (has no administration in time range)  guaiFENesin (ROBITUSSIN) 100 MG/5ML liquid 5 mL (has no administration in time range)  ipratropium (ATROVENT) nebulizer solution 0.5 mg (has no administration in time range)  albuterol (PROVENTIL) (2.5 MG/3ML) 0.083% nebulizer solution 2.5 mg (has no administration in time range)  predniSONE (DELTASONE) tablet 50 mg (has no administration in time range)  insulin aspart (novoLOG) injection 0-9 Units (has no administration in time range)  albuterol (PROVENTIL) (2.5 MG/3ML) 0.083% nebulizer solution 2.5 mg (2.5 mg Nebulization Given 09/04/23 1142)  albuterol (PROVENTIL) (2.5 MG/3ML) 0.083% nebulizer solution (10 mg/hr Nebulization Given 09/04/23 1328)  magnesium sulfate IVPB 2 g 50 mL (0 g Intravenous Stopped 09/04/23 1437)  methylPREDNISolone sodium succinate (SOLU-MEDROL) 125 mg/2 mL injection 125 mg (125 mg Intravenous Given 09/04/23 1422)    ED Course/ Medical Decision Making/ A&P                                 Medical Decision Making Amount and/or Complexity of Data Reviewed Labs: ordered. Radiology: ordered.  Risk Prescription drug management. Decision regarding hospitalization.   This patient presents to the ED with chief complaint(s) of cough and shortness of breath with pertinent past medical history of A-fib which further complicates the presenting  complaint. The complaint involves an extensive differential diagnosis and also carries with it a high risk of complications and morbidity.    The differential diagnosis includes pneumonia, bronchitis, A-fib, UTI  Additional history obtained: Records reviewed Primary Care Documents  ED Course and Reassessment:   Independent labs interpretation:  The following labs were independently interpreted:  CBC: Leukocytosis at 12.8 BMP: No notable findings Troponin: 8, 8 EKG: A-fib with RVR, nonspecific ST and T wave abnormality Protime-INR: Increased BNP: 283 Resp Panel: Negative  Independent visualization of imaging: - I independently visualized the following imaging with scope of interpretation limited to determining acute life threatening conditions related to emergency care:  Chest x-ray, which revealed mild CHF/pulmonary edema, superimposed pneumonitis overlying the bilateral lower lung zones not be excluded  Consultation: - Consulted or discussed management/test interpretation w/ external professional: [facility] Dr. Alinda Money and Dr. Rennis Golden with cardiology  Consideration for admission or further workup: Talked with Dr. Alinda Money of [facility] who is agreeable to admission for acute respiratory distress.         Final Clinical Impression(s) / ED Diagnoses Final diagnoses:  Respiratory failure with hypoxia, unspecified chronicity [facility])    Rx / DC Orders ED Discharge Orders     None         Dolphus Jenny, PA-C 09/04/23 1758    Dolphus Jenny, PA-C 09/04/23 1820    Maia Plan, MD 09/14/23 1500

## 2023-09-04 NOTE — H&P (Signed)
History and Physical   Christy Poole:[id number] DOB: 09-Sep-1961 DOA: 09/04/2023  PCP: de [place], Raymond J, MD   Patient coming from: Home  Chief Complaint: Cough, shortness of breath, chest pain  HPI: Christy Poole is a 62 y.o. 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  History obtained with assistance of chart review and family as well as translation.  Patient is reporting 2 months of shortness of breath, chest pain and cough.  She saw pulmonology this year and has been diagnosed with chronic bronchitis.  Last evaluation was in May of this year and patient was continued on Dulera at that time.  CT imaging over this year also noted mediastinal lymphadenopathy currently being followed with repeat CT in 6 months.  Patient also has known history of rheumatic mitral stenosis.  Last echo in August of this year showed moderate to severe mitral stenosis with mildly reduced RV function.  Does have history of balloon valvuloplasty.  She additionally is reporting multiple symptoms for the past couple of weeks including subjective fevers, body ache, headache, cough, sore throat, dysuria, rhinorrhea.  Reports decreased p.o. intake due to this. Family has tried some brandy for cough but this caused the headache and some dizziness per family.  Denies chills, abdominal pain, constipation, diarrhea, nausea, vomiting.  ED Course: Vital signs in the ED notable for blood pressure in the 100s to 160s systolic.  Heart rate in the 90s to 110s.  Respiratory rate in the 20s to 30s.  Currently saturating well on room air.  Lab workup included BMP with glucose 140.  CBC with leukocytosis to 12.8.  PT and INR elevated at 30.6 and 2.9 respectively.  Troponin negative x 2.  BNP elevated to 283.  Respiratory panel  for flu COVID RSV negative.  Chest x-ray with increased interstitial markings favoring edema/CHF but unable to exclude superimposed pneumonitis.  Patient received Solu-Medrol, magnesium, continuous albuterol in the ED.  I progress EDP to consult cardiology to determine if they feel patient needs to be seen now from the standpoint of her mitral valve disease versus waiting till after echocardiogram for possible consult.  Review of Systems: As per HPI otherwise all other systems reviewed and are negative.  Past Medical History:  Diagnosis Date   Atrial fibrillation (HCC)    Diabetes mellitus without complication (HCC)    Stroke [facility])    Thyroid disease     History reviewed. No pertinent surgical history.  Social History  reports that she has never smoked. She has never used smokeless tobacco. She reports that she does not drink alcohol and does not use drugs.  No Known Allergies  History reviewed. No pertinent family history.  Prior to Admission medications   Medication Sig Start Date End Date Taking? Authorizing Provider  acetaminophen (TYLENOL) 500 MG tablet Take 1,000 mg by mouth every 6 (six) hours as needed for mild pain, headache or fever.   Yes [provider]  albuterol (VENTOLIN HFA) 108 (90 Base) MCG/ACT inhaler Inhale 1-2 puffs into the lungs every 4 (four) hours as needed for wheezing or shortness of breath. 11/18/22 06/10/24 Yes Luciano Cutter, MD  aspirin EC (ASPIRIN LOW DOSE) 81 MG tablet TAKE 1 TABLET (81 MG TOTAL) BY MOUTH DAILY. (SWALLOW WHOLE) 06/30/23  Yes Jodelle Red, MD  atorvastatin (LIPITOR) 80 MG tablet TAKE 1 TABLET (80 MG TOTAL)  BY MOUTH DAILY. 06/30/23  Yes Jodelle Red, MD  benzonatate (TESSALON) 100 MG capsule Take 1 capsule (100 mg total) by mouth 2 (two) times daily as needed for cough. 08/30/23  Yes de [place], Raymond J, MD  gabapentin (NEURONTIN) 100 MG capsule Take 1 capsule (100 mg total) by mouth at bedtime. 07/12/23  Yes Micki Riley, MD  levETIRAcetam (KEPPRA) 250 MG tablet TAKE 1 TABLET (250 MG TOTAL) BY MOUTH 2 (TWO) TIMES DAILY. 08/30/23  Yes Micki Riley, MD  levothyroxine (SYNTHROID) 50 MCG tablet TAKE 1 TABLET (50 MCG TOTAL) BY MOUTH AT BEDTIME. 07/03/23  Yes de [place], Raymond J, MD  metFORMIN (GLUCOPHAGE-XR) 500 MG 24 hr tablet Take 1 tablet (500 mg total) by mouth daily with breakfast. 02/17/23  Yes Alyson Reedy, FNP  metoprolol succinate (TOPROL-XL) 50 MG 24 hr tablet TAKE 1 & 1/2 TABLETS (75 MG TOTAL) BY MOUTH DAILY. 06/30/23  Yes Jodelle Red, MD  mometasone-formoterol [facility]) 100-5 MCG/ACT AERO Inhale 2 puffs into the lungs in the morning and at bedtime. 03/16/23  Yes Luciano Cutter, MD  warfarin (COUMADIN) 2.5 MG tablet TAKE 1-2 TABLETS DAILY OR AS PRESCRIBED BY COUMADIN CLINIC Patient taking differently: Take 2.5-3.75 mg by mouth See admin instructions. Take 3.75mg  (1.5 tablets) by mouth every Wednesday and Thursday, take 2.5mg  (1 tablet) by mouth daily on all other days. 05/26/23  Yes Jodelle Red, MD  Spacer/Aero-Holding Chambers (OPTICHAMBER DIAMOND-LG MASK) [facility] Use as directed. 03/16/23   Luciano Cutter, MD    Physical Exam: Vitals:   09/04/23 1156 09/04/23 1335 09/04/23 1459 09/04/23 1545  BP: (!) 161/86 (!) 153/90  105/79  Pulse: (!) 118 (!) 106  (!) 117  Resp: (!) 32 (!) 36  (!) 29  Temp:      SpO2: 95% 100% 100% 94%  Weight: 49.9 kg     Height: 4\' 11"  (1.499 m)       Physical Exam Constitutional:      General: She is not in acute distress.    Appearance: She is ill-appearing.  HENT:     Head: Normocephalic and atraumatic.     Mouth/Throat:     Mouth: Mucous membranes are moist.     Pharynx: Oropharynx is clear.  Eyes:     Extraocular Movements: Extraocular movements intact.     Pupils: Pupils are equal, round, and reactive to light.  Cardiovascular:     Rate and Rhythm: Tachycardia present. Rhythm irregular.     Pulses: Normal pulses.     Heart sounds:  Murmur heard.  Pulmonary:     Effort: Tachypnea present.     Breath sounds: Wheezing and rales present.     Comments: Mild to moderate increased work of breathing Abdominal:     General: Bowel sounds are normal. There is no distension.     Palpations: Abdomen is soft.     Tenderness: There is no abdominal tenderness.  Musculoskeletal:        General: No swelling or deformity.  Skin:    General: Skin is warm and dry.  Neurological:     General: No focal deficit present.     Mental Status: Mental status is at baseline.    Labs on Admission: I have personally reviewed following labs and imaging studies  CBC: Recent Labs  Lab 09/04/23 1144  WBC 12.8*  HGB 13.3  HCT 41.6  MCV 89.8  PLT 188    Basic Metabolic Panel: Recent Labs  Lab 09/04/23 1144  NA 138  K 4.0  CL 104  CO2 23  GLUCOSE 140*  BUN 21  CREATININE 0.80  CALCIUM 9.7    GFR: Estimated Creatinine Clearance: 48.5 mL/min (by C-G formula based on SCr of 0.8 mg/dL).  Liver Function Tests: No results for input(s): "AST", "ALT", "ALKPHOS", "BILITOT", "PROT", "ALBUMIN" in the last 168 hours.  Urine analysis: No results found for: "COLORURINE", "APPEARANCEUR", "LABSPEC", "PHURINE", "GLUCOSEU", "HGBUR", "BILIRUBINUR", "KETONESUR", "PROTEINUR", "UROBILINOGEN", "NITRITE", "LEUKOCYTESUR"  Radiological Exams on Admission: DG Chest 2 View  Result Date: 09/04/2023 CLINICAL DATA:  Chest pain.  Shortness of breath.  Cough. EXAM: CHEST - 2 VIEW COMPARISON:  08/30/2023. FINDINGS: There are increased interstitial markings with bilateral hilar and bibasilar predominance. There is no significant interval change since the recent prior chest radiograph. There is mild peribronchial cuffing. No acute consolidation or lung collapse. Bilateral costophrenic angles are clear. Stable mildly enlarged cardio-mediastinal silhouette. No acute osseous abnormalities. The soft tissues are within normal limits. IMPRESSION: *Increased  interstitial markings throughout bilateral lungs with bilateral hilar and bibasilar predominance. Findings are favored to represent mild congestive heart failure/pulmonary edema. However, superimposed pneumonitis overlying the bilateral lower lung zones can not be excluded. Correlate clinically. No acute dense consolidation or lung collapse. Electronically Signed   By: Jules Schick M.D.   On: 09/04/2023 15:47    EKG: Independently reviewed.  Atrial fibrillation with RVR at 112 bpm.  Baseline wander in minimal baseline artifact.  QTc 417.  Nonspecific ST changes.  Assessment/Plan Principal Problem:   Respiratory distress Active Problems:   Hypothyroidism   Atrial fibrillation (HCC)   History of stroke   Dyslipidemia   Diabetes mellitus (HCC)   Respiratory distress Chronic bronchitis Rheumatoid mitral stenosis > Patient presenting with cough chest pain shortness of breath that has been ongoing but recently worsened. > Does have wheezing on exam and has been diagnosed with chronic bronchitis.  Could records present exacerbation.  > However, also noted to have elevated BNP and evidence of edema on chest x-ray with history of moderate to severe mitral valve stenosis with history of balloon value past the and mild right heart failure which could be playing a role. > Not responding to Solu-Medrol, magnesium, continuous albuterol in the ED. Not yet requiring oxygen. > No evidence of infection on chest x-ray, urinalysis pending.  Does have leukocytosis 12.8 however this could be reactive.  Remains afebrile.  Negative for flu COVID and RSV. -Monitoring on progressive unit for now given lack of clarity of her etiology and continued tachypnea without good response initial treatments.  May require BiPAP if increased work of breathing remains. - Monitor on progressive unit - Check full respiratory viral panel given viral type symptoms to evaluate for possible viral etiology of potential acute on chronic  bronchitis. - Hold off on antibiotics - Trend fever curve and WBC - Continue with home Dulera - Scheduled Prednisone and Atrovent, as needed albuterol - Check echocardiogram - Procalcitonin - Supportive care  Hyperlipidemia - Continue home atorvastatin  Hypothyroidism - Continue home Synthroid  History of CVA - Continue home ASA - Warfarin per pharmacy  Diabetes - SSI  Atrial fibrillation - Continue home metoprolol - Warfarin per pharmacy as INR currently 2.9.  DVT prophylaxis: Warfarin Code Status:   Full Family Communication:  Updated at bedside  Disposition Plan:   Patient is from:  Home  Anticipated DC to:  Home  Anticipated DC date:  1 to 5 days  Anticipated DC barriers: None  Consults called:  EDP will discuss with cardiology, undetermined if they will see yet or with we will wait till after echocardiogram to reconsult. Admission status:  Observation, progressive  Severity of Illness: The appropriate patient status for this patient is OBSERVATION. Observation status is judged to be reasonable and necessary in order to provide the required intensity of service to ensure the patient's safety. The patient's presenting symptoms, physical exam findings, and initial radiographic and laboratory data in the context of their medical condition is felt to place them at decreased risk for further clinical deterioration. Furthermore, it is anticipated that the patient will be medically stable for discharge from the hospital within 2 midnights of admission.    Synetta Fail MD [facility]  How to contact the [facility] Attending or Consulting provider 7A - 7P or covering provider during after hours 7P -7A, for this patient?   Check the care team in [facility] and look for a) attending/consulting [facility] provider listed and b) the [facility] team listed Log into [email or web address] and use [place]'s universal password to access. If you do not have the password, please contact the hospital  operator. Locate the [facility] provider you are looking for under [facility] and page to a number that you can be directly reached. If you still have difficulty reaching the provider, please page the [facility] (Director on Call) for the Hospitalists listed on amion for assistance.  09/04/2023, 4:29 PM

## 2023-09-05 DIAGNOSIS — R0603 Acute respiratory distress: Secondary | ICD-10-CM | POA: Diagnosis not present

## 2023-09-05 DIAGNOSIS — J189 Pneumonia, unspecified organism: Secondary | ICD-10-CM | POA: Diagnosis not present

## 2023-09-05 DIAGNOSIS — R791 Abnormal coagulation profile: Secondary | ICD-10-CM | POA: Diagnosis present

## 2023-09-05 DIAGNOSIS — J9601 Acute respiratory failure with hypoxia: Secondary | ICD-10-CM | POA: Diagnosis not present

## 2023-09-05 DIAGNOSIS — E1165 Type 2 diabetes mellitus with hyperglycemia: Secondary | ICD-10-CM | POA: Diagnosis present

## 2023-09-05 DIAGNOSIS — Z8673 Personal history of transient ischemic attack (TIA), and cerebral infarction without residual deficits: Secondary | ICD-10-CM | POA: Diagnosis not present

## 2023-09-05 DIAGNOSIS — J9621 Acute and chronic respiratory failure with hypoxia: Secondary | ICD-10-CM | POA: Diagnosis present

## 2023-09-05 DIAGNOSIS — I4821 Permanent atrial fibrillation: Secondary | ICD-10-CM | POA: Diagnosis present

## 2023-09-05 DIAGNOSIS — R0609 Other forms of dyspnea: Secondary | ICD-10-CM

## 2023-09-05 DIAGNOSIS — N182 Chronic kidney disease, stage 2 (mild): Secondary | ICD-10-CM

## 2023-09-05 DIAGNOSIS — T380X5A Adverse effect of glucocorticoids and synthetic analogues, initial encounter: Secondary | ICD-10-CM | POA: Diagnosis present

## 2023-09-05 DIAGNOSIS — Z79899 Other long term (current) drug therapy: Secondary | ICD-10-CM | POA: Diagnosis not present

## 2023-09-05 DIAGNOSIS — E1122 Type 2 diabetes mellitus with diabetic chronic kidney disease: Secondary | ICD-10-CM

## 2023-09-05 DIAGNOSIS — J42 Unspecified chronic bronchitis: Secondary | ICD-10-CM | POA: Diagnosis present

## 2023-09-05 DIAGNOSIS — Z7982 Long term (current) use of aspirin: Secondary | ICD-10-CM | POA: Diagnosis not present

## 2023-09-05 DIAGNOSIS — Z23 Encounter for immunization: Secondary | ICD-10-CM | POA: Diagnosis not present

## 2023-09-05 DIAGNOSIS — R072 Precordial pain: Secondary | ICD-10-CM | POA: Diagnosis not present

## 2023-09-05 DIAGNOSIS — Z1152 Encounter for screening for COVID-19: Secondary | ICD-10-CM | POA: Diagnosis not present

## 2023-09-05 DIAGNOSIS — I2729 Other secondary pulmonary hypertension: Secondary | ICD-10-CM | POA: Diagnosis present

## 2023-09-05 DIAGNOSIS — E039 Hypothyroidism, unspecified: Secondary | ICD-10-CM | POA: Diagnosis present

## 2023-09-05 DIAGNOSIS — R911 Solitary pulmonary nodule: Secondary | ICD-10-CM | POA: Diagnosis present

## 2023-09-05 DIAGNOSIS — N289 Disorder of kidney and ureter, unspecified: Secondary | ICD-10-CM | POA: Diagnosis not present

## 2023-09-05 DIAGNOSIS — E785 Hyperlipidemia, unspecified: Secondary | ICD-10-CM | POA: Diagnosis present

## 2023-09-05 DIAGNOSIS — J180 Bronchopneumonia, unspecified organism: Secondary | ICD-10-CM | POA: Diagnosis present

## 2023-09-05 DIAGNOSIS — I050 Rheumatic mitral stenosis: Secondary | ICD-10-CM

## 2023-09-05 DIAGNOSIS — I110 Hypertensive heart disease with heart failure: Secondary | ICD-10-CM | POA: Diagnosis present

## 2023-09-05 DIAGNOSIS — Z79890 Hormone replacement therapy (postmenopausal): Secondary | ICD-10-CM | POA: Diagnosis not present

## 2023-09-05 DIAGNOSIS — I5031 Acute diastolic (congestive) heart failure: Secondary | ICD-10-CM | POA: Diagnosis present

## 2023-09-05 DIAGNOSIS — G40209 Localization-related (focal) (partial) symptomatic epilepsy and epileptic syndromes with complex partial seizures, not intractable, without status epilepticus: Secondary | ICD-10-CM | POA: Diagnosis present

## 2023-09-05 DIAGNOSIS — I5082 Biventricular heart failure: Secondary | ICD-10-CM | POA: Diagnosis present

## 2023-09-05 DIAGNOSIS — R053 Chronic cough: Secondary | ICD-10-CM | POA: Diagnosis not present

## 2023-09-05 DIAGNOSIS — N179 Acute kidney failure, unspecified: Secondary | ICD-10-CM | POA: Diagnosis present

## 2023-09-05 DIAGNOSIS — J470 Bronchiectasis with acute lower respiratory infection: Secondary | ICD-10-CM | POA: Diagnosis present

## 2023-09-05 DIAGNOSIS — R0602 Shortness of breath: Secondary | ICD-10-CM

## 2023-09-05 DIAGNOSIS — E1129 Type 2 diabetes mellitus with other diabetic kidney complication: Secondary | ICD-10-CM | POA: Diagnosis not present

## 2023-09-05 DIAGNOSIS — R059 Cough, unspecified: Secondary | ICD-10-CM

## 2023-09-05 LAB — GLUCOSE, CAPILLARY
Glucose-Capillary: 241 mg/dL — ABNORMAL HIGH (ref 70–99)
Glucose-Capillary: 233 mg/dL — ABNORMAL HIGH (ref 70–99)

## 2023-09-05 LAB — COMPREHENSIVE METABOLIC PANEL
ALT: 19 U/L (ref 0–44)
AST: 21 U/L (ref 15–41)
Albumin: 3.5 g/dL (ref 3.5–5.0)
Alkaline Phosphatase: 72 U/L (ref 38–126)
Anion gap: 12 (ref 5–15)
BUN: 19 mg/dL (ref 8–23)
CO2: 23 mmol/L (ref 22–32)
Calcium: 9.8 mg/dL (ref 8.9–10.3)
Chloride: 100 mmol/L (ref 98–111)
Creatinine, Ser: 0.91 mg/dL (ref 0.44–1.00)
GFR, Estimated: >60 mL/min (ref >60)
Glucose, Bld: 249 mg/dL — ABNORMAL HIGH (ref 70–99)
Potassium: 3.8 mmol/L (ref 3.5–5.1)
Sodium: 135 mmol/L (ref 135–145)
Total Bilirubin: 0.8 mg/dL (ref <1.2)
Total Protein: 7.8 g/dL (ref 6.5–8.1)

## 2023-09-05 LAB — CBC
HCT: 39.8 % (ref 36.0–46.0)
Hemoglobin: 13.1 g/dL (ref 12.0–15.0)
MCH: 28.6 pg (ref 26.0–34.0)
MCHC: 32.9 g/dL (ref 30.0–36.0)
MCV: 86.9 fL (ref 80.0–100.0)
Platelets: 191 10*3/uL (ref 150–400)
RBC: 4.58 MIL/uL (ref 3.87–5.11)
RDW: 13.6 % (ref 11.5–15.5)
WBC: 13.4 10*3/uL — ABNORMAL HIGH (ref 4.0–10.5)
nRBC: 0 % (ref 0.0–0.2)

## 2023-09-05 LAB — MRSA NEXT GEN BY PCR, NASAL: MRSA by PCR Next Gen: NOT DETECTED

## 2023-09-05 LAB — CBG MONITORING, ED
Glucose-Capillary: 185 mg/dL — ABNORMAL HIGH (ref 70–99)
Glucose-Capillary: 259 mg/dL — ABNORMAL HIGH (ref 70–99)
Glucose-Capillary: 326 mg/dL — ABNORMAL HIGH (ref 70–99)

## 2023-09-05 LAB — PROTIME-INR
INR: 2.8 — ABNORMAL HIGH (ref 0.8–1.2)
Prothrombin Time: 29.8 s — ABNORMAL HIGH (ref 11.4–15.2)

## 2023-09-05 LAB — PROCALCITONIN: Procalcitonin: <0.1 ng/mL

## 2023-09-05 MED ORDER — AZITHROMYCIN 500 MG PO TABS
250.0000 mg | ORAL_TABLET | Freq: Every day | ORAL | Status: AC
  Administered 2023-09-06 – 2023-09-09 (×4): 250 mg via ORAL
  Filled 2023-09-05 (×4): qty 1

## 2023-09-05 MED ORDER — PNEUMOCOCCAL 20-VAL CONJ VACC 0.5 ML IM SUSY
0.5000 mL | PREFILLED_SYRINGE | INTRAMUSCULAR | Status: AC | PRN
  Administered 2023-09-13: 0.5 mL via INTRAMUSCULAR
  Filled 2023-09-05: qty 0.5

## 2023-09-05 MED ORDER — INSULIN GLARGINE-YFGN 100 UNIT/ML ~~LOC~~ SOLN
10.0000 [IU] | Freq: Every day | SUBCUTANEOUS | Status: DC
  Administered 2023-09-05: 10 [IU] via SUBCUTANEOUS
  Filled 2023-09-05 (×2): qty 0.1

## 2023-09-05 MED ORDER — AZITHROMYCIN 250 MG PO TABS
500.0000 mg | ORAL_TABLET | Freq: Every day | ORAL | Status: AC
  Administered 2023-09-05: 500 mg via ORAL
  Filled 2023-09-05: qty 2

## 2023-09-05 MED ORDER — DILTIAZEM LOAD VIA INFUSION
10.0000 mg | Freq: Once | INTRAVENOUS | Status: AC
  Administered 2023-09-05: 10 mg via INTRAVENOUS
  Filled 2023-09-05: qty 10

## 2023-09-05 MED ORDER — INSULIN ASPART 100 UNIT/ML IJ SOLN
0.0000 [IU] | Freq: Three times a day (TID) | INTRAMUSCULAR | Status: DC
  Administered 2023-09-05: 5 [IU] via SUBCUTANEOUS
  Administered 2023-09-05: 8 [IU] via SUBCUTANEOUS
  Administered 2023-09-05 – 2023-09-07 (×4): 3 [IU] via SUBCUTANEOUS
  Administered 2023-09-07: 2 [IU] via SUBCUTANEOUS
  Administered 2023-09-08: 11 [IU] via SUBCUTANEOUS
  Administered 2023-09-08: 3 [IU] via SUBCUTANEOUS
  Administered 2023-09-09: 2 [IU] via SUBCUTANEOUS
  Administered 2023-09-09: 8 [IU] via SUBCUTANEOUS
  Administered 2023-09-10: 11 [IU] via SUBCUTANEOUS

## 2023-09-05 MED ORDER — HYDROCOD POLI-CHLORPHE POLI ER 10-8 MG/5ML PO SUER
5.0000 mL | Freq: Two times a day (BID) | ORAL | Status: DC
  Administered 2023-09-05 – 2023-09-13 (×17): 5 mL via ORAL
  Filled 2023-09-05 (×17): qty 5

## 2023-09-05 MED ORDER — FUROSEMIDE 10 MG/ML IJ SOLN
40.0000 mg | Freq: Every day | INTRAMUSCULAR | Status: DC
  Administered 2023-09-05: 40 mg via INTRAVENOUS
  Filled 2023-09-05: qty 4

## 2023-09-05 MED ORDER — INSULIN ASPART 100 UNIT/ML IJ SOLN
0.0000 [IU] | Freq: Every day | INTRAMUSCULAR | Status: DC
  Administered 2023-09-05: 2 [IU] via SUBCUTANEOUS
  Administered 2023-09-06: 5 [IU] via SUBCUTANEOUS
  Administered 2023-09-07: 4 [IU] via SUBCUTANEOUS
  Administered 2023-09-08: 3 [IU] via SUBCUTANEOUS
  Administered 2023-09-09: 5 [IU] via SUBCUTANEOUS
  Administered 2023-09-10: 4 [IU] via SUBCUTANEOUS

## 2023-09-05 MED ORDER — INFLUENZA VIRUS VACC SPLIT PF (FLUZONE) 0.5 ML IM SUSY
0.5000 mL | PREFILLED_SYRINGE | Freq: Once | INTRAMUSCULAR | Status: AC | PRN
  Administered 2023-09-13: 0.5 mL via INTRAMUSCULAR
  Filled 2023-09-05: qty 0.5

## 2023-09-05 MED ORDER — DILTIAZEM HCL-DEXTROSE 125-5 MG/125ML-% IV SOLN (PREMIX)
5.0000 mg/h | INTRAVENOUS | Status: DC
  Administered 2023-09-05: 5 mg/h via INTRAVENOUS
  Filled 2023-09-05: qty 125

## 2023-09-05 MED ORDER — IPRATROPIUM BROMIDE 0.02 % IN SOLN: 0.5000 mg | Freq: Two times a day (BID) | RESPIRATORY_TRACT | Status: DC

## 2023-09-05 NOTE — Progress Notes (Signed)
Progress Note   Patient: Christy Poole WUJ:[id number] DOB: 1961/02/26 DOA: 09/04/2023     0 DOS: the patient was seen and examined on 09/05/2023   Brief hospital course: Taken from H&P.  Christy Poole is a 62 y.o. 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  Patient followed up with a pulmonologist and was diagnosed with chronic bronchitis, she was given Dulera at that time. CT imaging over this year also noted mediastinal lymphadenopathy currently being followed with repeat CT in 6 months.   Patient also has known history of rheumatic mitral stenosis.  Last echo in August of this year showed moderate to severe mitral stenosis with mildly reduced RV function.  Does have history of balloon valvuloplasty.  On presentation to ED, mildly tachypneic and tachycardic, labs with leukocytosis at 12.8, PT and INR elevated at 30.6 and 2.9 respectively, on Coumadin. Troponin negative x 2. BNP elevated to 283. Respiratory panel for flu COVID RSV negative. Chest x-ray with increased interstitial markings favoring edema/CHF but unable to exclude superimposed pneumonitis.  EKG shows A-fib with RVR.  Patient received Solu-Medrol, magnesium, continuous albuterol in the ED. Cardiology was also consulted.  Echocardiogram was ordered.  11/12: Vital stable, remained on room air.  Labs with INR 2.8,Procalcitonin negative,  worsening leukocytosis at 13.4 but patient also received steroid,  blood glucose elevated above 200,  UA with mild image urea and rare bacteria. RVP negative.  Echocardiogram with normal EF, flattening of interventricular septum during systole consistent with right ventricular overload, mildly reduced RV function, severely dilated left atrium and  severe mitral stenosis. Cardiology thinks that her symptoms are mostly pulmonary and should be addressed first before taking care of mitral stenosis. Started on Z-Pak and Tussionex  Patient do have some orthopnea and exertional dyspnea.  Intermittent chest pain which is related mostly to cough and questionable history of exertional chest pain, going on for couple of month.  Discussed with cardiology and they would like to keep her for 1-2 more days for right and left cardiac catheterization and start working on her severe mitral stenosis which will be later completely addressed as outpatient.  Patient remained in A-fib-starting on Cardizem infusion as recommended by cardiology.  Assessment and Plan: * Respiratory distress Chronic cough/chronic pneumonitis/chronic bronchitis. Patient presented with worsening dyspnea, some element of orthopnea, intermittent chest pain which is mostly related to cough but sometimes with exertion.  Has seen a pulmonologist and working diagnosis of pneumonitis versus chronic bronchitis. Respiratory viral panel, COVID, influenza and RSV negative.  Procalcitonin negative.  Patient remained on room air.  Likely multifactorial with her history of chronic bronchitis and worsening mitral stenosis as she is also having some orthopnea. Concern of congestive heart failure secondary to severe MS, echocardiogram with normal EF and indeterminate diastolic function due to A-fib with RVR.  Cardiology would like to do right and left cardiac catheterization for further evaluation.  -Added Tussionex and Zithromax for concern of chronic bronchitis exacerbation. -Continue with steroid -Continue with supportive care  Permanent atrial fibrillation with RVR (HCC) Patient remained in RVR. -Starting on Cardizem infusion as EF is normal and recommended by cardiology. -Continue with home metoprolol and Coumadin.  Rheumatic mitral stenosis S/p valvuloplasty in the past.  Echocardiogram  with worsening stenosis, severely dilated left atrium and concern of some pulmonary vascular congestion. -  Cardiology is on board -Will likely need another procedure as remained quite symptomatic -Continue with Coumadin  Diabetes mellitus (HCC) -Continue with SSI  History of stroke -Continue home aspirin and Coumadin  Dyslipidemia -Continue home atorvastatin  Hypothyroidism -Continue home Synthroid   Subjective: Patient continued to have significant cough and shortness of breath.  She was unable to lay flat for a while.  Going on for the past few months with progressively worsening symptoms.  Having intermittent chest pain which she think is mostly related to her coughing spells.  Patient is a [place] speaking lady and interview was done with the help of son.  Physical Exam: Vitals:   09/05/23 1255 09/05/23 1300 09/05/23 1305 09/05/23 1306  BP:  (!) 112/92    Pulse:  (!) 121    Resp: (!) 27 19 (!) 30   Temp:    98.6 F (37 C)  TempSrc:    Oral  SpO2: 96% 95% 95%   Weight:      Height:       General.  Frail lady, in no acute distress. Pulmonary.  Lungs clear bilaterally, normal respiratory effort. CV.  Regular rate and rhythm, no JVD, rub or murmur. Abdomen.  Soft, nontender, nondistended, BS positive. CNS.  Alert and oriented .  No focal neurologic deficit. Extremities.  No edema, no cyanosis, pulses intact and symmetrical. Psychiatry.  Judgment and insight appears normal.   Data Reviewed: Prior data reviewed  Family Communication: Discussed with son  Disposition: Status is: Inpatient Remains inpatient appropriate because: Severity of illness  Planned Discharge Destination: Home  DVT prophylaxis.  Coumadin Time spent: 50 minutes  This record has been created using Conservation officer, historic buildings. Errors have been sought and corrected,but may not always be located. Such creation errors do not reflect on the standard of care.   Author: Arnetha Courser,  MD 09/05/2023 1:23 PM  For on call review [email or web address].

## 2023-09-05 NOTE — Progress Notes (Signed)
Patient Name: Christy Poole Date of Encounter: 09/05/2023 [facility] Cardiologist: Jodelle Red, MD   Interval Summary  .    Patient seen in ER bed 7 She speaks [place] and I was able to communicate in her language. She comes in for progressive shortness of breath and chest pain  Chest pain: Occurs with coughing spells and also with exertion. With exertion chest pain is left-sided, intensity 7 out of 10, sharp/pressure-like, better with resting, more progressive over the last 3 months according to the son.  Shortness of breath: Progressive. Unable to lay flat, + PND No lower extremity swelling Difficulty doing activities of daily living-daughter-in-law helps.  No history of mitral stenosis underwent balloon valvuloplasty?  2016 in [place]  During the encounter patient is ventricular rate increases up to 140 bpm.  Vital Signs .    Vitals:   09/05/23 1145 09/05/23 1200 09/05/23 1215 09/05/23 1230  BP: 100/66 93/72 98/61    Pulse:  83 95 91  Resp: (!) 21 (!) 21 20 (!) 22  Temp:      TempSrc:      SpO2: 95% 96% 94% 95%  Weight:      Height:        Intake/Output Summary (Last 24 hours) at 09/05/2023 1300 Last data filed at 09/04/2023 1437 Gross per 24 hour  Intake 47.59 ml  Output --  Net 47.59 ml      09/04/2023   11:56 AM 07/12/2023   12:47 PM 05/25/2023    7:59 AM  Last 3 Weights  Weight (lbs) 110 lb 111 lb 110 lb 6.4 oz  Weight (kg) 49.896 kg 50.349 kg 50.077 kg      Telemetry/ECG    Atrial fibrillation with rapid ventricular rate mostly, at times controlled- Personally Reviewed  09/04/2023: A-fib with RVR, 112 bpm, nonspecific ST-T changes-personally reviewed  Cardiology studies:   Echo 09/05/2023  1. Left ventricular ejection fraction, by estimation, is 55 to 60%. The  left ventricle has normal function. The left ventricle has no regional  wall motion abnormalities. indeterminate due to atrial fibrillation. There   is the interventricular septum is flattened in systole, consistent with right ventricular pressure overload.   2. Right ventricular systolic function is mildly reduced. The right  ventricular size is normal. There is normal pulmonary artery systolic  pressure.   3. Left atrial size was severely dilated.   4. Difficult to assess given atrial fibrillation with rapid rate but  appears MS is severe by mean gradient ( ) and by PHT (1.2-1.4cm^2).  The mitral valve is rheumatic. Trivial mitral valve regurgitation. Severe  mitral stenosis. The mean mitral  valve gradient is 11.2 mmHg.   5. The aortic valve is tricuspid. Aortic valve regurgitation is not  visualized.   6. The inferior vena cava is normal in size with <50% respiratory  variability, suggesting right atrial pressure of 8 mmHg.   Physical Exam .   GEN: Age-appropriate, sitting upright in bed, to picnic, coughing Neck: + JVP Cardiac: Irregularly irregular, tachycardic, diastolic murmur heard left lower sternal border  Respiratory: Good inspiratory effort, no wheezes, rales bilaterally GI: Soft, nontender, non-distended  MS: No edema  Assessment & Plan .    Impression: Dyspnea on exertion. Precordial pain-concerning for cardiac etiology A-fib with RVR Mitral stenosis with history of balloon valvuloplasty-moderate at baseline, severe on recent echo (tachycardic) History of stroke Diabetes mellitus type 2, non-insulin-dependent  Recommendations: Dyspnea on exertion & Precordial pain Likely secondary to underlying  valvular heart disease. However symptoms are progressive and difficulty to do her activities of daily living requiring assistance. Her precordial discomfort appears to be cardiac but either could be due to supply/demand ischemia in the setting of valvular heart disease and RVR or obstructive disease.  However given her risk factors obstructive CAD cannot be ruled out.  She has no recent stress  test/CTA/angiography. Hs troponin negative.  BNP mildly elevated.  For now would recommend controlling her ventricular rate and diuresing. Would like to speak to her son with regards to pursuing left and right heart catheterization.  I called him 3 times in the presence of the patient but it goes to voicemail.  Will likely reach out to him later this evening.  Atrial fibrillation with rapid ventricular rate: At rest she is around 110 bpm or less But with conversations, coughing, and ambulation definitely in RVR LVEF is preserved. Will start Cardizem drip. Coumadin dosing by pharmacy. Will focus on rate control strategy for now - noted to have permanent A-fib as per the last cardiology office note.  Mitral stenosis History of balloon valvuloplasty?  2016 Underlying mitral stenosis at least moderate based on prior echocardiogram and severe on current study (likely overestimated due to A-fib with RVR). Severe LAE and RV pressure overload on echo.  Recommend left and right heart catheterization for now - ruled out obstructive disease.  Based on RHC data and her clinical trajectory will decide MV workup / intervention.   Leukocytosis: Infectious workup per primary team \\20  respiratory panel pathogens -negative  Influenza and COVID-19 tested negative  Hx of TIA - continue ASA and statin   NIDDM: per primary team.   Spoke to attending physician and given my concerns for her precordial pain and progressive dyspnea on exertion.  Would like to hold off on discharging the patient at this time for reasons mentioned above.  Attending physician in agreement.  Total time spent 55 minutes.   For questions or updates, please contact  [facility] Please consult [email or web address] for contact info under     Signed, Tessa Lerner, DO, [facility]  [facility]  [address] [place], [place] [address] Pager: [phone] Office: [phone] 1:15 PM 09/05/23

## 2023-09-05 NOTE — Assessment & Plan Note (Signed)
-  Continue with SSI

## 2023-09-05 NOTE — Assessment & Plan Note (Signed)
S/p valvuloplasty in the past.  Echocardiogram with worsening stenosis, severely dilated left atrium and concern of some pulmonary vascular congestion. -Cardiology is on board -Will likely need another procedure as remained quite symptomatic -Continue with Coumadin

## 2023-09-05 NOTE — Assessment & Plan Note (Signed)
-   Continue home Synthroid °

## 2023-09-05 NOTE — Assessment & Plan Note (Signed)
Patient remained in RVR. -Starting on Cardizem infusion as EF is normal and recommended by cardiology. -Continue with home metoprolol and Coumadin.

## 2023-09-05 NOTE — ED Notes (Signed)
Pt alert, NAD, calm, interactive, resps e/u, speaking clearly. VSS. Afib on monitor.

## 2023-09-05 NOTE — ED Notes (Signed)
Cards at [facility], speaking with pt, son, and hospitalist re: plan. Will continue with admission plan. Pt placement/ bed control notified. No new orders. Pt resting comfortably, but endorses to cards CP and sob at home with minimal exertion.

## 2023-09-05 NOTE — ED Notes (Addendum)
Hospitalist at BS

## 2023-09-05 NOTE — Assessment & Plan Note (Signed)
-  Continue home aspirin and Coumadin

## 2023-09-05 NOTE — Progress Notes (Addendum)
PHARMACY - ANTICOAGULATION CONSULT NOTE  Pharmacy Consult for warfarin Indication: atrial fibrillation  No Known Allergies  Patient Measurements: Height: 4\' 11"  (149.9 cm) Weight: 49.9 kg (110 lb) IBW/kg (Calculated) : 43.2  Vital Signs: Temp: 97.7 F (36.5 C) (11/12 0609) Temp Source: Oral (11/12 0609) BP: 108/62 (11/12 0715) Pulse Rate: 88 (11/12 0715)  Labs: Recent Labs    09/04/23 1144 09/04/23 1300 09/04/23 1335 09/05/23 0421  HGB 13.3  --   --  13.1  HCT 41.6  --   --  39.8  PLT 188  --   --  191  LABPROT  --  30.6*  --  29.8*  INR  --  2.9*  --  2.8*  CREATININE 0.80  --   --  0.91  TROPONINIHS 8  --  8  --     Estimated Creatinine Clearance: 42.6 mL/min (by C-G formula based on SCr of 0.91 mg/dL).   Medical History: Past Medical History:  Diagnosis Date   Atrial fibrillation (HCC)    Diabetes mellitus without complication (HCC)    Stroke [facility])    Thyroid disease    Assessment: 62yoF on chronic warfarin, INR goal of 2.5-3.0 per outpatient anticoag note for Afib. Home dose of 5 mg Mon, Fri, and 3.75 AOD. Noted regimen in med rec is different, discussed with patient's son at bedside and he confirmed 5 mg/3.75mg  regimen.   Today, INR is therapeutic at 2.8. Patient starting azithromycin which can cause increased bleeding risk, will monitor closely.   Goal of Therapy:  INR 2.5 - 3.0 Monitor platelets by anticoagulation protocol: Yes   Plan:  Continue home regimen (5 mg MF, 3.75 mg AOD) Monitor INR and CBC daily and PRN  Ruben Im, PharmD Clinical Pharmacist 09/05/2023 7:47 AM Please check AMION for all [facility] Pharmacy numbers

## 2023-09-05 NOTE — Hospital Course (Addendum)
Taken from H&P.  Christy Poole is a 62 y.o. 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  Patient followed up with a pulmonologist and was diagnosed with chronic bronchitis, she was given Dulera at that time. CT imaging over this year also noted mediastinal lymphadenopathy currently being followed with repeat CT in 6 months.   Patient also has known history of rheumatic mitral stenosis.  Last echo in August of this year showed moderate to severe mitral stenosis with mildly reduced RV function.  Does have history of balloon valvuloplasty.  On presentation to ED, mildly tachypneic and tachycardic, labs with leukocytosis at 12.8, PT and INR elevated at 30.6 and 2.9 respectively, on Coumadin. Troponin negative x 2. BNP elevated to 283. Respiratory panel for flu COVID RSV negative. Chest x-ray with increased interstitial markings favoring edema/CHF but unable to exclude superimposed pneumonitis.  EKG shows A-fib with RVR.  Patient received Solu-Medrol, magnesium, continuous albuterol in the ED. Cardiology was also consulted.  Echocardiogram was ordered.  11/12: Vital stable, remained on room air.  Labs with INR 2.8,Procalcitonin negative,  worsening leukocytosis at 13.4 but patient also received steroid,  blood glucose elevated above 200,  UA with mild image urea and rare bacteria. RVP negative.  Echocardiogram with normal EF, flattening of interventricular septum during systole consistent with right ventricular overload, mildly reduced RV function, severely dilated left atrium and severe mitral stenosis. Cardiology thinks that her symptoms are mostly pulmonary and should be addressed first before taking care of mitral stenosis. Started on Z-Pak and  Tussionex  Patient do have some orthopnea and exertional dyspnea.  Intermittent chest pain which is related mostly to cough and questionable history of exertional chest pain, going on for couple of month.  Discussed with cardiology and they would like to keep her for 1-2 more days for right and left cardiac catheterization and start working on her severe mitral stenosis which will be later completely addressed as outpatient.  Patient remained in A-fib-starting on Cardizem infusion as recommended by cardiology.

## 2023-09-05 NOTE — ED Notes (Signed)
Son leaving BS, will be back in ~1.5 hrs. Assigned bed cancelled. Pt likely to be d/c'd home per report, per hospitalist.

## 2023-09-05 NOTE — Assessment & Plan Note (Signed)
-   Continue home atorvastatin 

## 2023-09-05 NOTE — Assessment & Plan Note (Addendum)
Chronic cough/chronic pneumonitis/chronic bronchitis. Patient presented with worsening dyspnea, some element of orthopnea, intermittent chest pain which is mostly related to cough but sometimes with exertion.  Has seen a pulmonologist and working diagnosis of pneumonitis versus chronic bronchitis. Respiratory viral panel, COVID, influenza and RSV negative.  Procalcitonin negative.  Patient remained on room air.  Likely multifactorial with her history of chronic bronchitis and worsening mitral stenosis as she is also having some orthopnea. Concern of congestive heart failure secondary to severe MS, echocardiogram with normal EF and indeterminate diastolic function due to A-fib with RVR.  Cardiology would like to do right and left cardiac catheterization for further evaluation.  -Added Tussionex and Zithromax for concern of chronic bronchitis exacerbation. -Continue with steroid -Continue with supportive care

## 2023-09-06 DIAGNOSIS — N289 Disorder of kidney and ureter, unspecified: Secondary | ICD-10-CM | POA: Diagnosis not present

## 2023-09-06 DIAGNOSIS — R072 Precordial pain: Secondary | ICD-10-CM | POA: Diagnosis not present

## 2023-09-06 DIAGNOSIS — I050 Rheumatic mitral stenosis: Secondary | ICD-10-CM | POA: Diagnosis not present

## 2023-09-06 DIAGNOSIS — R791 Abnormal coagulation profile: Secondary | ICD-10-CM

## 2023-09-06 DIAGNOSIS — R0609 Other forms of dyspnea: Secondary | ICD-10-CM | POA: Diagnosis not present

## 2023-09-06 LAB — CBC
HCT: 41.7 % (ref 36.0–46.0)
Hemoglobin: 13.4 g/dL (ref 12.0–15.0)
MCH: 28 pg (ref 26.0–34.0)
MCHC: 32.1 g/dL (ref 30.0–36.0)
MCV: 87.1 fL (ref 80.0–100.0)
Platelets: 215 10*3/uL (ref 150–400)
RBC: 4.79 MIL/uL (ref 3.87–5.11)
RDW: 13.8 % (ref 11.5–15.5)
WBC: 21.1 10*3/uL — ABNORMAL HIGH (ref 4.0–10.5)
nRBC: 0 % (ref 0.0–0.2)

## 2023-09-06 LAB — GLUCOSE, CAPILLARY
Glucose-Capillary: 154 mg/dL — ABNORMAL HIGH (ref 70–99)
Glucose-Capillary: 94 mg/dL (ref 70–99)
Glucose-Capillary: 161 mg/dL — ABNORMAL HIGH (ref 70–99)
Glucose-Capillary: 382 mg/dL — ABNORMAL HIGH (ref 70–99)

## 2023-09-06 LAB — BASIC METABOLIC PANEL
Anion gap: 8 (ref 5–15)
BUN: 47 mg/dL — ABNORMAL HIGH (ref 8–23)
CO2: 26 mmol/L (ref 22–32)
Calcium: 9.8 mg/dL (ref 8.9–10.3)
Chloride: 102 mmol/L (ref 98–111)
Creatinine, Ser: 1.18 mg/dL — ABNORMAL HIGH (ref 0.44–1.00)
GFR, Estimated: 52 mL/min — ABNORMAL LOW (ref >60)
Glucose, Bld: 130 mg/dL — ABNORMAL HIGH (ref 70–99)
Potassium: 4.4 mmol/L (ref 3.5–5.1)
Sodium: 136 mmol/L (ref 135–145)

## 2023-09-06 LAB — PROTIME-INR
INR: 4.2 (ref 0.8–1.2)
Prothrombin Time: 40.8 s — ABNORMAL HIGH (ref 11.4–15.2)

## 2023-09-06 MED ORDER — METOPROLOL TARTRATE 50 MG PO TABS
50.0000 mg | ORAL_TABLET | Freq: Two times a day (BID) | ORAL | Status: DC
  Administered 2023-09-06 – 2023-09-07 (×3): 50 mg via ORAL
  Filled 2023-09-06 (×3): qty 1

## 2023-09-06 MED ORDER — DILTIAZEM HCL 30 MG PO TABS: 30.0000 mg | ORAL_TABLET | Freq: Three times a day (TID) | ORAL | Status: DC

## 2023-09-06 MED ORDER — METOPROLOL TARTRATE 5 MG/5ML IV SOLN
5.0000 mg | Freq: Once | INTRAVENOUS | Status: AC
  Administered 2023-09-06: 5 mg via INTRAVENOUS
  Filled 2023-09-06: qty 5

## 2023-09-06 MED ORDER — FUROSEMIDE 10 MG/ML IJ SOLN
20.0000 mg | Freq: Every day | INTRAMUSCULAR | Status: DC
  Administered 2023-09-06 – 2023-09-07 (×2): 20 mg via INTRAVENOUS
  Filled 2023-09-06 (×2): qty 2

## 2023-09-06 MED ORDER — PHYTONADIONE 5 MG PO TABS
2.5000 mg | ORAL_TABLET | Freq: Once | ORAL | Status: AC
  Administered 2023-09-06: 2.5 mg via ORAL
  Filled 2023-09-06: qty 1

## 2023-09-06 NOTE — Progress Notes (Signed)
Mobility Specialist Progress Note:   09/06/23 0900  Mobility  Activity Ambulated with assistance in hallway  Level of Assistance Contact guard assist, steadying assist  Assistive Device Front wheel walker  Distance Ambulated (ft) 100 ft  Activity Response Tolerated well  Mobility Referral Yes  $Mobility charge 1 Mobility  Mobility Specialist Start Time (ACUTE ONLY) [id number]  Mobility Specialist Stop Time (ACUTE ONLY) 0942  Mobility Specialist Time Calculation (min) (ACUTE ONLY) 13 min    Pre Mobility: 82 HR,  123/95 (104) BP,  93% SpO2 During Mobility: 104 HR Post Mobility:  95 HR  Pt received in bed, agreeable to mobility. Presented w/ cough this am, though otherwise no complaints. A bit wobbly but able to self correct w/ no actual LOB. Returned to room w/o fault. Pt left in bed with call bell and all needs met.  [name] Mobility Specialist Please contact via Special educational needs teacher or Rehab office at [phone]

## 2023-09-06 NOTE — Progress Notes (Signed)
Dr Odis Hollingshead reviewed with our cath team and he plans to rx a dose of vitamin K given INR 4.2 and do cath tomorrow if INR is acceptable. I put in a diet and made NPO after midnight, the team will revisit the final plans in the morning. I also looped in IM to adjust insulin if needed given that she was NPO this morning until now, and will need to be NPO after MN. Case tentatively posted for 1:30 tomorrow, will need formal orders in AM if moving forward. Nurse also made aware.

## 2023-09-06 NOTE — Progress Notes (Signed)
PHARMACY - ANTICOAGULATION CONSULT NOTE  Pharmacy Consult for warfarin/heparin Indication: atrial fibrillation  No Known Allergies  Patient Measurements: Height: 4\' 11"  (149.9 cm) Weight: 49.5 kg (109 lb 2 oz) IBW/kg (Calculated) : 43.2  Vital Signs: Temp: 97.8 F (36.6 C) (11/13 0802) Temp Source: Oral (11/13 0802) BP: 118/74 (11/13 0802) Pulse Rate: 89 (11/13 0802)  Labs: Recent Labs    09/04/23 1144 09/04/23 1300 09/04/23 1335 09/05/23 0421 09/06/23 0211 09/06/23 0623  HGB 13.3  --   --  13.1  --  13.4  HCT 41.6  --   --  39.8  --  41.7  PLT 188  --   --  191  --  215  LABPROT  --  30.6*  --  29.8* 40.8*  --   INR  --  2.9*  --  2.8* 4.2*  --   CREATININE 0.80  --   --  0.91  --  1.18*  TROPONINIHS 8  --  8  --   --   --     Estimated Creatinine Clearance: 32.8 mL/min (A) (by C-G formula based on SCr of 1.18 mg/dL (H)).   Medical History: Past Medical History:  Diagnosis Date   Atrial fibrillation (HCC)    Diabetes mellitus without complication (HCC)    Stroke [facility])    Thyroid disease    Assessment: 62yoF on chronic warfarin, INR goal of 2.5-3.0 per outpatient anticoag note for Afib. Home dose of 5 mg Mon, Fri, and 3.75 AOD. Noted regimen in med rec is different, discussed with patient's son at bedside and he confirmed 5 mg/3.75mg  regimen.   INR increased to 4.2 today. Plan for cath. After d/w team, will hold coumadin and give a dose of vit k. Can bridge with heparin if INR drops <2.5.   Goal of Therapy:  INR 2.5 - 3.0 Monitor platelets by anticoagulation protocol: Yes   Plan:  Hold coumadin Vit k 2.5mg  x1 Daily INR Heparin if INR<2.5  Ulyses Southward, PharmD, BCIDP, AAHIVP, CPP Infectious Disease Pharmacist 09/06/2023 9:50 AM

## 2023-09-06 NOTE — Progress Notes (Signed)
INR 4.2. no bleeding noted. RPH JamesW. and Dr Lazarus Salines were notified. -to Check CBC.  -care ongoing.

## 2023-09-06 NOTE — Progress Notes (Signed)
Pt BP drop to 91/56 , HR  down to 57-70s.  Pauses noted. Pt has no complaints. Cardizem drip was held. Dr Lazarus Salines was made aware. Care ongoing.

## 2023-09-06 NOTE — Progress Notes (Signed)
  Progress Note   Date: 09/06/2023  Patient Name: Christy Poole        MRN#: [id number]  Review the patient's clinical findings supports the diagnosis of:   Diabetes mellitus type 2 uncontrolled with hyperglycemia

## 2023-09-06 NOTE — TOC CM/SW Note (Signed)
Transition of Care [facility]) - Inpatient Brief Assessment   Patient Details  Name: Christy Poole MRN: [id number] Date of Birth: 09-04-1961  [facility] CM/SW Contact:    Harriet Masson, RN Phone Number: 09/06/2023, 2:03 PM   Clinical Narrative:  Plan to give dose of vitamin K given INR 4.2 and do cath tomorrow if INR is acceptable.  No TOC needs at this time.  Transition of Care Asessment: Insurance and Status: Insurance coverage has been reviewed Patient has primary care physician: Yes Home environment has been reviewed: safe to discharge home when medically stable Prior level of function:: independent Prior/Current Home Services: No current home services Social Determinants of Health Reivew: SDOH reviewed no interventions necessary Readmission risk has been reviewed: Yes Transition of care needs: no transition of care needs at this time

## 2023-09-06 NOTE — Progress Notes (Signed)
PROGRESS NOTE    Christy Poole  YQM:[id number] DOB: 1961/09/26 DOA: 09/04/2023 PCP: de [place], Raymond J, MD  Chief Complaint  Patient presents with   Cough   Chest Pain   Shortness of Breath    Hospital Course:  Christy Poole is a 62 y.o. 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 Patient followed up with a pulmonologist and was diagnosed with chronic bronchitis, she was given Dulera at that time. CT imaging over this year also noted mediastinal lymphadenopathy currently being followed with repeat CT in 6 months. Patient also has known history of rheumatic mitral stenosis.  Last echo in August of this year showed moderate to severe mitral stenosis with mildly reduced RV function.  Does have history of balloon valvuloplasty.  On presentation to ED, mildly tachypneic and tachycardic, labs with leukocytosis at 12.8, PT and INR elevated at 30.6 and 2.9 respectively, on Coumadin. Troponin negative x 2. BNP elevated to 283. Respiratory panel for flu/COVID/RSV negative. Chest x-ray with increased interstitial markings favoring edema/CHF but unable to exclude superimposed pneumonitis.  EKG shows A-fib with RVR.  Patient received Solu-Medrol, magnesium, continuous albuterol in the ED. Cardiology was also consulted.  Echocardiogram was ordered. 11/12: Vital stable, remained on room air.  Labs with INR 2.8,Procalcitonin negative,  worsening leukocytosis at 13.4 but patient also received steroid,  blood glucose elevated above 200,  UA with mild image urea and rare bacteria. RVP negative.  Echocardiogram with normal EF, flattening of interventricular septum during systole consistent with right ventricular overload, mildly reduced RV function, severely  dilated left atrium and severe mitral stenosis. Cardiology thinks that her symptoms are mostly pulmonary and should be addressed first before taking care of mitral stenosis.  Started on Z-Pak and Tussionex.  Patient remained in A-fib-starting on Cardizem infusion as recommended by cardiology. 11/13: INR rose to 4.2 likely from Keppra and Z-Pak.  Pharmacy consulted.  Plan to hold Coumadin today and give dose of vitamin K.  Cardiology now planning for cath tomorrow.  Subjective: No acute events overnight.  Patient is A-fib rate in the 70s on evaluation this morning.  She continues to endorse a cough but reports that it is improved from prior.    Objective: Vitals:   09/06/23 0620 09/06/23 0802 09/06/23 0835 09/06/23 1125  BP:  118/74  131/69  Pulse:  89  (!) 109  Resp:  13  20  Temp:  97.8 F (36.6 C)  97.8 F (36.6 C)  TempSrc:  Oral  Oral  SpO2:  92% 95% 94%  Weight: 49.5 kg     Height:        Intake/Output Summary (Last 24 hours) at 09/06/2023 1526 Last data filed at 09/06/2023 0615 Gross per 24 hour  Intake 240.89 ml  Output 250 ml  Net -9.11 ml   Filed Weights   09/04/23 1156 09/06/23 0620  Weight: 49.9 kg 49.5 kg    Examination:  General exam: Appears calm and comfortable  Respiratory system: Coughing, some upper airway rales Cardiovascular system: irregularly irregular, rate 70s, +murmur  Gastrointestinal system: Abdomen is nondistended, soft and nontender. No organomegaly or masses felt. Normal bowel sounds heard. Central nervous system: Alert and oriented. No focal neurological deficits. Extremities: Symmetric 5 x 5 power. Skin: No rashes, lesions or ulcers Psychiatry: Judgement and insight appear normal.  Mood & affect appropriate.   Assessment & Plan:   Principal Problem:   Respiratory distress Active Problems:   Permanent atrial fibrillation (HCC)   Rheumatic mitral stenosis   Diabetes mellitus (HCC)   History of stroke   Dyslipidemia    Hypothyroidism   Cough   Dyspnea on exertion   Precordial chest pain   Renal insufficiency   Supratherapeutic INR     Assessment and Plan: * Respiratory distress Chronic cough/chronic pneumonitis/chronic bronchitis. Patient presented with worsening dyspnea, some element of orthopnea, intermittent chest pain which is mostly related to cough but sometimes with exertion.  Has seen a pulmonologist and working diagnosis of pneumonitis versus chronic bronchitis. Respiratory viral panel, COVID, influenza and RSV negative.  Procalcitonin negative.   -Main stable on room air -Dyspnea is likely multifactorial given history of chronic bronchitis as well as worsening mitral stenosis with orthopnea - Concern of congestive heart failure secondary to severe MS, echocardiogram with normal EF and indeterminate diastolic function due to A-fib with RVR. -Continue with prednisone -Continue with azithromycin, end date 11/17 -Continue home dose Dulera -Continue Robitussin   Permanent atrial fibrillation with RVR (HCC) Resolved now status post Cardizem drip EF preserved Cardiology consulted and following Appreciate further recommendations Continue current dose Lopressor  On warfarin anticoagulation Therapeutic on arrival, supratherapeutic today.  Likely secondary to newly added azithromycin.  Given INR before patient did receive vitamin K.  Pharmacy has been consulted.  They will manage warfarin.  Hold Coumadin today.  May require heparin bridging prior to discharge if INR falls below 2 on resumption tomorrow   Rheumatic mitral stenosis S/p valvuloplasty in the past.  Echocardiogram with worsening stenosis, severely dilated left atrium and concern of some pulmonary vascular congestion. - Cont lasix -Cardiology is on board -Planning for heart cath tomorrow   Diabetes mellitus (HCC) -Continue with SSI, hold long acting today as bedside RN reports minimal intake and pt was NPO most of AM. NPO again in  AM for cath.   History of stroke -Continue home aspirin and Coumadin   Dyslipidemia -Continue home atorvastatin   Hypothyroidism -Continue home Synthroid  Complex partial seizures -Follows with neurology outpatient -Continue home dose Keppra  Leukocytosis WBC rising - now 21 Patient remains afebrile, procalcitonin unremarkable.   Suspect leukocytosis is secondary to steroid use.  Will continue to monitor.  No signs or symptoms of other infection at this time.  Continue azithromycin for bronchitis treatment as above.   Patient is a [place] speaking and translator was used for the entirety of our interaction.  DVT prophylaxis: Coumadin Code Status: Full Family Communication: No family at bedside during rounding. Discussed directly with patient. Disposition:   Status is: Inpatient, pending heart cath tomorrow.    Consultants:  Cardiology, Dr. Odis Hollingshead  Procedures:  Cath tomorrow  Antimicrobials:  Anti-infectives (From admission, onward)    Start     Dose/Rate Route Frequency Ordered Stop   09/06/23 1000  azithromycin (ZITHROMAX) tablet 250 mg       Placed in "Followed by" Linked Group   250 mg Oral Daily 09/05/23 0758 09/10/23 0959   09/05/23 1000  azithromycin (ZITHROMAX) tablet 500 mg       Placed in "Followed by" Linked Group   500 mg Oral Daily 09/05/23 0758 09/05/23 0812       Data Reviewed: I have personally reviewed following labs and imaging studies  CBC: Recent Labs  Lab 09/04/23 1144 09/05/23 0421 09/06/23 0623  WBC 12.8* 13.4* 21.1*  HGB  13.3 13.1 13.4  HCT 41.6 39.8 41.7  MCV 89.8 86.9 87.1  PLT 188 191 215    Basic Metabolic Panel: Recent Labs  Lab 09/04/23 1144 09/04/23 1335 09/05/23 0421 09/06/23 0623  NA 138  --  135 136  K 4.0  --  3.8 4.4  CL 104  --  100 102  CO2 23  --  23 26  GLUCOSE 140*  --  249* 130*  BUN 21  --  19 47*  CREATININE 0.80  --  0.91 1.18*  CALCIUM 9.7  --  9.8 9.8  MG  --  2.0  --   --      GFR: Estimated Creatinine Clearance: 32.8 mL/min (A) (by C-G formula based on SCr of 1.18 mg/dL (H)).  Liver Function Tests: Recent Labs  Lab 09/05/23 0421  AST 21  ALT 19  ALKPHOS 72  BILITOT 0.8  PROT 7.8  ALBUMIN 3.5    CBG: Recent Labs  Lab 09/05/23 1128 09/05/23 1555 09/05/23 2128 09/06/23 0609 09/06/23 1057  GLUCAP 259* 241* 233* 154* 94     Recent Results (from the past 240 hour(s))  Resp panel by RT-PCR (RSV, Flu A&B, Covid) Anterior Nasal Swab     Status: None   Collection Time: 09/04/23  1:20 PM   Specimen: Anterior Nasal Swab  Result Value Ref Range Status   SARS Coronavirus 2 by RT PCR NEGATIVE NEGATIVE Final   Influenza A by PCR NEGATIVE NEGATIVE Final   Influenza B by PCR NEGATIVE NEGATIVE Final    Comment: (NOTE) The Xpert Xpress SARS-CoV-2/FLU/RSV plus assay is intended as an aid in the diagnosis of influenza from Nasopharyngeal swab specimens and should not be used as a sole basis for treatment. Nasal washings and aspirates are unacceptable for Xpert Xpress SARS-CoV-2/FLU/RSV testing.  Fact Sheet for Patients: BloggerCourse.com  Fact Sheet for Healthcare Providers: SeriousBroker.it  This test is not yet approved or cleared by the [place] FDA and has been authorized for detection and/or diagnosis of SARS-CoV-2 by FDA under an Emergency Use Authorization (EUA). This EUA will remain in effect (meaning this test can be used) for the duration of the COVID-19 declaration under Section 564(b)(1) of the Act, 21 U.S.C. section 360bbb-3(b)(1), unless the authorization is terminated or revoked.     Resp Syncytial Virus by PCR NEGATIVE NEGATIVE Final    Comment: (NOTE) Fact Sheet for Patients: BloggerCourse.com  Fact Sheet for Healthcare Providers: SeriousBroker.it  This test is not yet approved or cleared by the [place] FDA and has  been authorized for detection and/or diagnosis of SARS-CoV-2 by FDA under an Emergency Use Authorization (EUA). This EUA will remain in effect (meaning this test can be used) for the duration of the COVID-19 declaration under Section 564(b)(1) of the Act, 21 U.S.C. section 360bbb-3(b)(1), unless the authorization is terminated or revoked.  Performed at [facility], [address], [place], [place] [address]   Respiratory (~20 pathogens) panel by PCR     Status: None   Collection Time: 09/04/23  5:04 PM   Specimen: Nasopharyngeal Swab; Respiratory  Result Value Ref Range Status   Adenovirus NOT DETECTED NOT DETECTED Final   Coronavirus 229E NOT DETECTED NOT DETECTED Final    Comment: (NOTE) The Coronavirus on the Respiratory Panel, DOES NOT test for the novel  Coronavirus (2019 nCoV)    Coronavirus HKU1 NOT DETECTED NOT DETECTED Final   Coronavirus NL63 NOT DETECTED NOT DETECTED Final   Coronavirus OC43 NOT  DETECTED NOT DETECTED Final   Metapneumovirus NOT DETECTED NOT DETECTED Final   Rhinovirus / Enterovirus NOT DETECTED NOT DETECTED Final   Influenza A NOT DETECTED NOT DETECTED Final   Influenza B NOT DETECTED NOT DETECTED Final   Parainfluenza Virus 1 NOT DETECTED NOT DETECTED Final   Parainfluenza Virus 2 NOT DETECTED NOT DETECTED Final   Parainfluenza Virus 3 NOT DETECTED NOT DETECTED Final   Parainfluenza Virus 4 NOT DETECTED NOT DETECTED Final   Respiratory Syncytial Virus NOT DETECTED NOT DETECTED Final   Bordetella pertussis NOT DETECTED NOT DETECTED Final   Bordetella Parapertussis NOT DETECTED NOT DETECTED Final   Chlamydophila pneumoniae NOT DETECTED NOT DETECTED Final   Mycoplasma pneumoniae NOT DETECTED NOT DETECTED Final    Comment: Performed at [facility], [address] [place], [place] [address]  MRSA Next Gen by PCR, Nasal     Status: None   Collection Time: 09/05/23  2:22 PM   Specimen: Nasal Mucosa; Nasal Swab  Result Value Ref Range Status    MRSA by PCR Next Gen NOT DETECTED NOT DETECTED Final    Comment: (NOTE) The GeneXpert MRSA Assay (FDA approved for NASAL specimens only), is one component of a comprehensive MRSA colonization surveillance program. It is not intended to diagnose MRSA infection nor to guide or monitor treatment for MRSA infections. Test performance is not FDA approved in patients less than 72 years old. Performed at [facility], [address] [place], [place] [address]          Radiology Studies: ECHOCARDIOGRAM COMPLETE  Result Date: 09/04/2023    ECHOCARDIOGRAM REPORT   [name] Date of Exam: 09/04/2023 Medical Rec #:  [id number]       Height:       59.0 in Accession #:    [id number]      Weight:       110.0 lb Date of Birth:  Jun 30, 1961       BSA:          1.431 m Patient Age:    62 years        BP:           126/69 mmHg Patient Gender: F               HR:           118 bpm. Exam Location:  Inpatient Procedure: 2D Echo, Color Doppler and Cardiac Doppler STAT ECHO Indications:     mitral stenosis  History:         Patient has prior history of Echocardiogram examinations, most                  recent 06/20/2022. History of stroke, Arrythmias:Atrial                  Fibrillation, Signs/Symptoms:Dyspnea; Risk Factors:Dyslipidemia                  and Diabetes.  Sonographer:     Delcie Roch RDCS Referring Phys:  [id number] Cecille Po MELVIN Diagnosing Phys: Clearnce Hasten  Sonographer Comments: Image acquisition challenging due to respiratory motion. IMPRESSIONS  1. Left ventricular ejection fraction, by estimation, is 55 to 60%. The left ventricle has normal function. The left ventricle has no regional wall motion abnormalities. indeterminate due to atrial fibrillation. There is the interventricular septum is flattened in systole, consistent with right ventricular pressure overload.  2. Right ventricular systolic function is  mildly reduced. The right ventricular size is normal.  There is normal pulmonary artery systolic pressure.  3. Left atrial size was severely dilated.  4. Difficult to assess given atrial fibrillation with rapid rate but appears MS is severe by mean gradient ( ) and by PHT (1.2-1.4cm^2). The mitral valve is rheumatic. Trivial mitral valve regurgitation. Severe mitral stenosis. The mean mitral valve gradient is 11.2 mmHg.  5. The aortic valve is tricuspid. Aortic valve regurgitation is not visualized.  6. The inferior vena cava is normal in size with <50% respiratory variability, suggesting right atrial pressure of 8 mmHg. FINDINGS  Left Ventricle: Left ventricular ejection fraction, by estimation, is 55 to 60%. The left ventricle has normal function. The left ventricle has no regional wall motion abnormalities. The left ventricular internal cavity size was small. There is no left ventricular hypertrophy. The interventricular septum is flattened in systole, consistent with right ventricular pressure overload. Indeterminate due to atrial fibrillation. Right Ventricle: The right ventricular size is normal. No increase in right ventricular wall thickness. Right ventricular systolic function is mildly reduced. There is normal pulmonary artery systolic pressure. The tricuspid regurgitant velocity is 2.51 m/s, and with an assumed right atrial pressure of 3 mmHg, the estimated right ventricular systolic pressure is 28.2 mmHg. Left Atrium: Left atrial size was severely dilated. Right Atrium: Right atrial size was normal in size. Pericardium: There is no evidence of pericardial effusion. Mitral Valve: Difficult to assess given atrial fibrillation with rapid rate but appears MS is severe by mean gradient ( ) and by PHT (1.2-1.4cm^2). The mitral valve is rheumatic. There is mild thickening of the mitral valve leaflet(s). There is moderate calcification of the posterior mitral valve leaflet(s). Moderately decreased mobility of the mitral valve leaflets. Trivial mitral valve  regurgitation. Severe mitral valve stenosis. MV peak gradient, 21.6 mmHg. The mean mitral valve gradient is 11.2 mmHg. Tricuspid Valve: The tricuspid valve is normal in structure. Tricuspid valve regurgitation is mild. Aortic Valve: The aortic valve is tricuspid. Aortic valve regurgitation is not visualized. Pulmonic Valve: The pulmonic valve was grossly normal. Pulmonic valve regurgitation is not visualized. Aorta: The aortic root and ascending aorta are structurally normal, with no evidence of dilitation. Venous: The inferior vena cava is normal in size with less than 50% respiratory variability, suggesting right atrial pressure of 8 mmHg. IAS/Shunts: The interatrial septum was not well visualized.  LEFT VENTRICLE PLAX 2D LVIDd:         3.30 cm LVIDs:         2.30 cm LV PW:         0.80 cm LV IVS:        0.70 cm LVOT diam:     1.55 cm LV SV:         33 LV SV Index:   23 LVOT Area:     1.89 cm  RIGHT VENTRICLE          IVC RV Basal diam:  1.70 cm  IVC diam: 1.90 cm LEFT ATRIUM             Index        RIGHT ATRIUM           Index LA diam:        3.50 cm 2.45 cm/m   RA Area:     11.80 cm LA Vol (A2C):   43.2 ml 30.20 ml/m  RA Volume:   23.20 ml  16.22 ml/m LA Vol (A4C):   38.6 ml 26.98 ml/m  LA Biplane Vol: 41.9 ml 29.29 ml/m  AORTIC VALVE LVOT Vmax:   101.23 cm/s LVOT Vmean:  68.533 cm/s LVOT VTI:    0.173 m  AORTA Ao Root diam: 2.40 cm Ao Asc diam:  2.80 cm MITRAL VALVE              TRICUSPID VALVE MV Area VTI:  0.70 cm    TR Peak grad:   25.2 mmHg MV Peak grad: 21.6 mmHg   TR Vmax:        251.00 cm/s MV Mean grad: 11.2 mmHg MV Vmax:      2.32 m/s    SHUNTS MV Vmean:     157.3 cm/s  Systemic VTI:  0.17 m                           Systemic Diam: 1.55 cm Clearnce Hasten Electronically signed by Clearnce Hasten Signature Date/Time: 09/04/2023/6:23:56 PM    Final (Updated)         Scheduled Meds:  atorvastatin  80 mg Oral Daily   azithromycin  250 mg Oral Daily   chlorpheniramine-HYDROcodone  5 mL Oral  Q12H   furosemide  20 mg Intravenous Daily   gabapentin  100 mg Oral QHS   insulin aspart  0-15 Units Subcutaneous TID WC   insulin aspart  0-5 Units Subcutaneous QHS   levETIRAcetam  250 mg Oral BID   levothyroxine  50 mcg Oral Q0600   metoprolol tartrate  50 mg Oral BID   mometasone-formoterol  2 puff Inhalation BID   predniSONE  50 mg Oral Q breakfast   sodium chloride flush  3 mL Intravenous Q12H   Warfarin - Pharmacist Dosing Inpatient   Does not apply q1600   Continuous Infusions:   LOS: 1 day    Time spent:     Debarah Crape, DO [facility]   To contact the attending provider between 7A-7P or the covering provider during after hours 7P-7A, please log into the web site [email or web address] and access using universal [place] password for that web site. If you do not have the password, please call the hospital operator.  09/06/2023, 3:26 PM

## 2023-09-06 NOTE — Plan of Care (Signed)

## 2023-09-06 NOTE — Progress Notes (Signed)
Patient Name: Christy Poole Date of Encounter: 09/06/2023 [facility] Cardiologist: Jodelle Red, MD   Interval Summary  .    Shortness of breath improving. Chest pain still present Ventricular rate improved on Cardizem -but discontinued earlier this morning due to bradycardia/pauses noted on telemetry (asymptomatic)  Vital Signs .    Vitals:   09/06/23 0309 09/06/23 0620 09/06/23 0802 09/06/23 0835  BP: (!) 130/59  118/74   Pulse: 77  89   Resp: 13  13   Temp: 97.8 F (36.6 C)  97.8 F (36.6 C)   TempSrc: Oral  Oral   SpO2: 91%  92% 95%  Weight:  49.5 kg    Height:        Intake/Output Summary (Last 24 hours) at 09/06/2023 0939 Last data filed at 09/06/2023 0615 Gross per 24 hour  Intake 253.76 ml  Output 250 ml  Net 3.76 ml      09/06/2023    6:20 AM 09/04/2023   11:56 AM 07/12/2023   12:47 PM  Last 3 Weights  Weight (lbs) 109 lb 2 oz 110 lb 111 lb  Weight (kg) 49.5 kg 49.896 kg 50.349 kg      Telemetry/ECG    Afib controlled ventricular rate - Personally Reviewed  Physical Exam .   GEN: No acute distress.   Neck: + JVD Cardiac: Irregularly irregular, variable S1-S2, diastolic murmur heard at the apex,  Respiratory: Good inspiratory effort, rales noted bilaterally.   GI: Soft, nontender, non-distended  MS: No edema  Assessment & Plan .    Impression: Dyspnea on exertion. Precordial pain concerning for cardiac etiology. A-fib with RVR -now controlled ventricular rate Mitral stenosis with history of balloon valvuloplasty -monitor clinically as outpatient History of stroke Diabetes mellitus type 2, non-insulin-dependent  Recommendations: Dyspnea on exertion. Precordial pain concerning for cardiac etiology. Rheumatic mitral stenosis The patient has classic anginal discomfort however, the discomfort could be secondary to organic/obstructive CAD versus supply demand ischemia in the setting of underlying mitral stenosis and  A-fib with RVR.  However given her risk factors and no recent ischemic workup we discussed the role of left and right heart catheterization to further evaluate and risk stratify for both CAD and mitral valve disease. I spoke to the patient and her son over the phone.  RN present during the conversation Discussed the risks, benefits, alternatives to left and right heart catheterization.  If right heart catheterization hemodynamics are concerning for progressive mitral valve stenosis she will need to be worked up for mitral valve intervention (i.e.?  Balloon valvuloplasty/mitral valve replacement) and will need to consider TEE either inpatient / outpatient. Patient and his son are agreeable to proceed forward with heart catheterization. However, patient's INR today is 4.2 and therefore will postpone till tomorrow.   The procedure of left and right heart catheterization with possible intervention was explained to the patient and son (over the phone) in detail.  The indication, alternatives, risks and benefits were reviewed.  Complications include but not limited to bleeding, infection, vascular injury, stroke, myocardial infarction, arrhythmia (requiring medical or cardiopulmonary resuscitation), kidney injury (requiring short-term or long-term hemodialysis), radiation-related injury in the case of prolonged fluoroscopy use, emergent cardiac surgery, temporary or permanent pacemaker, and death. The patient and son understands the risks of serious complication is 1-2 in 1000 with diagnostic cardiac cath and 1-2% or less with angioplasty/stenting. The patient and son voices understanding and provides verbal feedback their questions and concerns are addressed to their satisfaction  and patient wishes to proceed with coronary angiography with possible PCI.  Renal insufficiency:  Baseline serum creatinine 0.7 Creatinine on arrival 0.8 mg/dL. Creatinine this morning 1.2 mg/dL Will change IV lasix to 20mg  IVP  daily. Net IO Since Admission: 51.35 mL [09/06/23 0939]  Atrial fibrillation, permanent Ventricular rate improved on Cardizem gtt. Transitioned to po lopressor 50mg  bid  Telemetry reviewed. Thromboembolic prophylaxis: Coumadin Recommend holding today's dose of Coumadin as she is above her goal with anticipation of heart catheterization tomorrow.Verdene Rio therapeutic INR:  Yday INR 2.8 Today INR 4.2 Likely due to starting antibiotics and on Keppra  Pharmacy to dose - spoke to them and they agree w/ giving Vitamin K.   Leukocytosis:  Will defer workup to primary team  History of TIA: Continue aspirin and statin therapy  Non-insulin-dependent diabetes: Glycemic control per primary team.  Dyslipidemia: Continue statin therapy.  I was able to have a meaningful conversation in her native language-[place] during morning rounds.  Her questions and concerns were addressed to her satisfaction.  Plan of care update - w/ patient, her son, RN, pharmacy, cath lab, attending physician.   For questions or updates, please contact [facility] Please consult [email or web address] for contact info under     Signed, Tessa Lerner, DO, [facility]  [facility]  [address] [place], [place] [address] Pager: [phone] Office: [phone] 09/06/23 9:39 AM

## 2023-09-07 ENCOUNTER — Encounter (HOSPITAL_COMMUNITY)
Admission: EM | Disposition: A | Discharge status: Home / Self Care | Payer: Self-pay | Source: Home / Self Care | Attending: Family Medicine

## 2023-09-07 DIAGNOSIS — I050 Rheumatic mitral stenosis: Secondary | ICD-10-CM | POA: Diagnosis not present

## 2023-09-07 HISTORY — PX: RIGHT/LEFT HEART CATH AND CORONARY ANGIOGRAPHY: CATH118266

## 2023-09-07 LAB — BASIC METABOLIC PANEL
Anion gap: 7 (ref 5–15)
BUN: 40 mg/dL — ABNORMAL HIGH (ref 8–23)
CO2: 27 mmol/L (ref 22–32)
Calcium: 9.5 mg/dL (ref 8.9–10.3)
Chloride: 99 mmol/L (ref 98–111)
Creatinine, Ser: 0.76 mg/dL (ref 0.44–1.00)
GFR, Estimated: >60 mL/min (ref >60)
Glucose, Bld: 144 mg/dL — ABNORMAL HIGH (ref 70–99)
Potassium: 4.8 mmol/L (ref 3.5–5.1)
Sodium: 133 mmol/L — ABNORMAL LOW (ref 135–145)

## 2023-09-07 LAB — GLUCOSE, CAPILLARY
Glucose-Capillary: 123 mg/dL — ABNORMAL HIGH (ref 70–99)
Glucose-Capillary: 99 mg/dL (ref 70–99)
Glucose-Capillary: 152 mg/dL — ABNORMAL HIGH (ref 70–99)
Glucose-Capillary: 317 mg/dL — ABNORMAL HIGH (ref 70–99)

## 2023-09-07 LAB — POCT I-STAT EG7
Acid-Base Excess: 3 mmol/L — ABNORMAL HIGH (ref 0.0–2.0)
Acid-Base Excess: 3 mmol/L — ABNORMAL HIGH (ref 0.0–2.0)
Bicarbonate: 28.1 mmol/L — ABNORMAL HIGH (ref 20.0–28.0)
Bicarbonate: 28.2 mmol/L — ABNORMAL HIGH (ref 20.0–28.0)
Calcium, Ion: 1.11 mmol/L — ABNORMAL LOW (ref 1.15–1.40)
Calcium, Ion: 1.13 mmol/L — ABNORMAL LOW (ref 1.15–1.40)
HCT: 39 % (ref 36.0–46.0)
HCT: 40 % (ref 36.0–46.0)
Hemoglobin: 13.3 g/dL (ref 12.0–15.0)
Hemoglobin: 13.6 g/dL (ref 12.0–15.0)
O2 Saturation: 61 %
O2 Saturation: 69 %
Potassium: 4 mmol/L (ref 3.5–5.1)
Potassium: 4.2 mmol/L (ref 3.5–5.1)
Sodium: 140 mmol/L (ref 135–145)
Sodium: 141 mmol/L (ref 135–145)
TCO2: 29 mmol/L (ref 22–32)
TCO2: 30 mmol/L (ref 22–32)
pCO2, Ven: 45.3 mm[Hg] (ref 44–60)
pCO2, Ven: 45.7 mm[Hg] (ref 44–60)
pH, Ven: 7.396 (ref 7.25–7.43)
pH, Ven: 7.402 (ref 7.25–7.43)
pO2, Ven: 32 mm[Hg] (ref 32–45)
pO2, Ven: 36 mm[Hg] (ref 32–45)

## 2023-09-07 LAB — CBC
HCT: 41.4 % (ref 36.0–46.0)
HCT: 42 % (ref 36.0–46.0)
Hemoglobin: 13.4 g/dL (ref 12.0–15.0)
Hemoglobin: 13.8 g/dL (ref 12.0–15.0)
MCH: 27.9 pg (ref 26.0–34.0)
MCH: 28.8 pg (ref 26.0–34.0)
MCHC: 32.4 g/dL (ref 30.0–36.0)
MCHC: 32.9 g/dL (ref 30.0–36.0)
MCV: 86.1 fL (ref 80.0–100.0)
MCV: 87.5 fL (ref 80.0–100.0)
Platelets: 207 10*3/uL (ref 150–400)
Platelets: 215 10*3/uL (ref 150–400)
RBC: 4.81 MIL/uL (ref 3.87–5.11)
RBC: 4.8 MIL/uL (ref 3.87–5.11)
RDW: 13.6 % (ref 11.5–15.5)
RDW: 13.5 % (ref 11.5–15.5)
WBC: 15.8 10*3/uL — ABNORMAL HIGH (ref 4.0–10.5)
WBC: 15.8 10*3/uL — ABNORMAL HIGH (ref 4.0–10.5)
nRBC: 0 % (ref 0.0–0.2)
nRBC: 0 % (ref 0.0–0.2)

## 2023-09-07 LAB — POCT I-STAT 7, (LYTES, BLD GAS, ICA,H+H)
Acid-Base Excess: 3 mmol/L — ABNORMAL HIGH (ref 0.0–2.0)
Bicarbonate: 26.8 mmol/L (ref 20.0–28.0)
Calcium, Ion: 1.2 mmol/L (ref 1.15–1.40)
HCT: 41 % (ref 36.0–46.0)
Hemoglobin: 13.9 g/dL (ref 12.0–15.0)
O2 Saturation: 91 %
Potassium: 4.3 mmol/L (ref 3.5–5.1)
Sodium: 139 mmol/L (ref 135–145)
TCO2: 28 mmol/L (ref 22–32)
pCO2 arterial: 39.1 mm[Hg] (ref 32–48)
pH, Arterial: 7.443 (ref 7.35–7.45)
pO2, Arterial: 59 mm[Hg] — ABNORMAL LOW (ref 83–108)

## 2023-09-07 LAB — PROTIME-INR
INR: 2 — ABNORMAL HIGH (ref 0.8–1.2)
Prothrombin Time: 22.5 s — ABNORMAL HIGH (ref 11.4–15.2)

## 2023-09-07 LAB — HEPARIN LEVEL (UNFRACTIONATED): Heparin Unfractionated: 0.28 [IU]/mL — ABNORMAL LOW (ref 0.30–0.70)

## 2023-09-07 SURGERY — RIGHT/LEFT HEART CATH AND CORONARY ANGIOGRAPHY
Anesthesia: LOCAL

## 2023-09-07 MED ORDER — ASPIRIN 81 MG PO CHEW: 81.0000 mg | CHEWABLE_TABLET | ORAL | Status: DC

## 2023-09-07 MED ORDER — ONDANSETRON HCL 4 MG/2ML IJ SOLN: 4.0000 mg | Freq: Four times a day (QID) | INTRAMUSCULAR | Status: DC | PRN

## 2023-09-07 MED ORDER — IOHEXOL 350 MG/ML SOLN
INTRAVENOUS | Status: DC | PRN
  Administered 2023-09-07: 50 mL via INTRA_ARTERIAL

## 2023-09-07 MED ORDER — METOPROLOL TARTRATE 50 MG PO TABS
50.0000 mg | ORAL_TABLET | Freq: Three times a day (TID) | ORAL | Status: DC
  Administered 2023-09-07: 50 mg via ORAL
  Filled 2023-09-07: qty 1

## 2023-09-07 MED ORDER — HEPARIN (PORCINE) IN NACL 1000-0.9 UT/500ML-% IV SOLN
INTRAVENOUS | Status: DC | PRN
  Administered 2023-09-07: 1000 mL via INTRA_ARTERIAL

## 2023-09-07 MED ORDER — VERAPAMIL HCL 2.5 MG/ML IV SOLN
INTRAVENOUS | Status: AC
  Filled 2023-09-07: qty 2

## 2023-09-07 MED ORDER — SPIRONOLACTONE 12.5 MG HALF TABLET
12.5000 mg | ORAL_TABLET | Freq: Every day | ORAL | Status: DC
  Administered 2023-09-07 – 2023-09-13 (×7): 12.5 mg via ORAL
  Filled 2023-09-07 (×7): qty 1

## 2023-09-07 MED ORDER — FENTANYL CITRATE (PF) 100 MCG/2ML IJ SOLN
INTRAMUSCULAR | Status: AC
  Filled 2023-09-07: qty 2

## 2023-09-07 MED ORDER — VERAPAMIL HCL 2.5 MG/ML IV SOLN
INTRAVENOUS | Status: DC | PRN
  Administered 2023-09-07: 10 mL via INTRA_ARTERIAL

## 2023-09-07 MED ORDER — ASPIRIN 81 MG PO CHEW
81.0000 mg | CHEWABLE_TABLET | Freq: Once | ORAL | Status: AC
  Administered 2023-09-07: 81 mg via ORAL
  Filled 2023-09-07: qty 1

## 2023-09-07 MED ORDER — LIDOCAINE HCL (PF) 1 % IJ SOLN
INTRAMUSCULAR | Status: AC
Start: 2023-09-07 — End: ?
  Filled 2023-09-07: qty 30

## 2023-09-07 MED ORDER — SODIUM CHLORIDE 0.9 % IV SOLN: 250.0000 mL | INTRAVENOUS | Status: AC | PRN

## 2023-09-07 MED ORDER — SODIUM CHLORIDE 0.9% FLUSH
3.0000 mL | Freq: Two times a day (BID) | INTRAVENOUS | Status: DC
  Administered 2023-09-07 – 2023-09-09 (×4): 3 mL via INTRAVENOUS

## 2023-09-07 MED ORDER — HEPARIN SODIUM (PORCINE) 1000 UNIT/ML IJ SOLN
INTRAMUSCULAR | Status: AC
Start: 2023-09-07 — End: ?
  Filled 2023-09-07: qty 10

## 2023-09-07 MED ORDER — WARFARIN SODIUM 2.5 MG PO TABS
3.7500 mg | ORAL_TABLET | Freq: Once | ORAL | Status: AC
  Administered 2023-09-07: 3.75 mg via ORAL
  Filled 2023-09-07: qty 1

## 2023-09-07 MED ORDER — PREDNISONE 20 MG PO TABS
40.0000 mg | ORAL_TABLET | Freq: Every day | ORAL | Status: DC
  Administered 2023-09-08 – 2023-09-10 (×3): 40 mg via ORAL
  Filled 2023-09-07 (×3): qty 2

## 2023-09-07 MED ORDER — HYDRALAZINE HCL 20 MG/ML IJ SOLN: 10.0000 mg | INTRAMUSCULAR | Status: AC | PRN

## 2023-09-07 MED ORDER — SODIUM CHLORIDE 0.9% FLUSH: 3.0000 mL | INTRAVENOUS | Status: DC | PRN

## 2023-09-07 MED ORDER — LABETALOL HCL 5 MG/ML IV SOLN
10.0000 mg | INTRAVENOUS | Status: AC | PRN
Start: 2023-09-07 — End: 2023-09-07

## 2023-09-07 MED ORDER — SODIUM CHLORIDE 0.9 % IV SOLN: INTRAVENOUS | Status: DC

## 2023-09-07 MED ORDER — HEPARIN SODIUM (PORCINE) 1000 UNIT/ML IJ SOLN
INTRAMUSCULAR | Status: DC | PRN
  Administered 2023-09-07: 3000 [IU] via INTRA_ARTERIAL

## 2023-09-07 MED ORDER — LIDOCAINE HCL (PF) 1 % IJ SOLN
INTRAMUSCULAR | Status: DC | PRN
  Administered 2023-09-07: 2 mL

## 2023-09-07 MED ORDER — ASPIRIN 81 MG PO CHEW: 81.0000 mg | CHEWABLE_TABLET | Freq: Once | ORAL | Status: DC

## 2023-09-07 MED ORDER — HEPARIN (PORCINE) 25000 UT/250ML-% IV SOLN
750.0000 [IU]/h | INTRAVENOUS | Status: DC
  Administered 2023-09-07: 700 [IU]/h via INTRAVENOUS
  Filled 2023-09-07: qty 250

## 2023-09-07 MED ORDER — MIDAZOLAM HCL 2 MG/2ML IJ SOLN
INTRAMUSCULAR | Status: AC
  Filled 2023-09-07: qty 2

## 2023-09-07 MED ORDER — ACETAMINOPHEN 325 MG PO TABS
650.0000 mg | ORAL_TABLET | ORAL | Status: DC | PRN
  Administered 2023-09-09 – 2023-09-10 (×2): 650 mg via ORAL
  Filled 2023-09-07 (×2): qty 2

## 2023-09-07 MED ORDER — MIDAZOLAM HCL 2 MG/2ML IJ SOLN
INTRAMUSCULAR | Status: DC | PRN
  Administered 2023-09-07: 1 mg via INTRAVENOUS

## 2023-09-07 SURGICAL SUPPLY — 12 items
CATH BALLN WEDGE 5F 110CM (CATHETERS) IMPLANT
CATH DIAG 6FR JL4 (CATHETERS) IMPLANT
CATH DIAG 6FR JR4 (CATHETERS) IMPLANT
CATH INFINITI 5FR ANG PIGTAIL (CATHETERS) IMPLANT
DEVICE RAD TR BAND REGULAR (VASCULAR PRODUCTS) IMPLANT
ELECT DEFIB PAD ADLT CADENCE (PAD) IMPLANT
GLIDESHEATH SLEND SS 6F .021 (SHEATH) IMPLANT
PACK CARDIAC CATHETERIZATION (CUSTOM PROCEDURE TRAY) ×1 IMPLANT
SET ATX-X65L (MISCELLANEOUS) IMPLANT
SHEATH GLIDE SLENDER 4/5FR (SHEATH) IMPLANT
SHEATH PROBE COVER 6X72 (BAG) IMPLANT
WIRE EMERALD 3MM-J .035X260CM (WIRE) IMPLANT

## 2023-09-07 NOTE — Progress Notes (Signed)
Spoke to the patient during morning rounds.  Dyspnea is better - able to lay flat.  HR is better controlled.  Currently on IV heparin gtt.  No family at bedside  No questions regarding upcoming cath.  Cath orders placed.  Consent signed.  RN update / questions answered.   No charge.  Christy Frady [place], DO, [facility]  11:51 AM 09/07/23

## 2023-09-07 NOTE — Plan of Care (Signed)

## 2023-09-07 NOTE — H&P (View-Only) (Signed)
Spoke to the patient during morning rounds.  Dyspnea is better - able to lay flat.  HR is better controlled.  Currently on IV heparin gtt.  No family at bedside  No questions regarding upcoming cath.  Cath orders placed.  Consent signed.  RN update / questions answered.   No charge.  Amadi Frady [place], DO, [facility]  11:51 AM 09/07/23

## 2023-09-07 NOTE — Progress Notes (Signed)
  Progress Note   Date: 09/07/2023  Patient Name: Christy Poole        MRN#: [id number]  Review of the patient's clinical findings supports the diagnosis of  Acute Kidney Injury

## 2023-09-07 NOTE — Progress Notes (Signed)
PHARMACY - ANTICOAGULATION  Pharmacy Consult for heparin Indication: atrial fibrillation Brief A/P: INR < 2.5  Will start heparin  No Known Allergies  Patient Measurements: Height: 4\' 11"  (149.9 cm) Weight: 49.5 kg (109 lb 2 oz) IBW/kg (Calculated) : 43.2  Vital Signs: Temp: 97.7 F (36.5 C) (11/14 0328) Temp Source: Oral (11/14 0328) BP: 125/77 (11/14 0328) Pulse Rate: 75 (11/14 0328)  Labs: Recent Labs    09/04/23 1144 09/04/23 1300 09/04/23 1335 09/05/23 0421 09/06/23 0211 09/06/23 0623 09/07/23 0242  HGB 13.3  --   --  13.1  --  13.4 13.4  HCT 41.6  --   --  39.8  --  41.7 41.4  PLT 188  --   --  191  --  215 207  LABPROT  --    < >  --  29.8* 40.8*  --  22.5*  INR  --    < >  --  2.8* 4.2*  --  2.0*  CREATININE 0.80  --   --  0.91  --  1.18* 0.76  TROPONINIHS 8  --  8  --   --   --   --    < > = values in this interval not displayed.    Estimated Creatinine Clearance: 48.5 mL/min (by C-G formula based on SCr of 0.76 mg/dL).   Assessment: 62 y.o. female with g/o Afib, Coumadin on hold for cath and INR < 2.5, to start heparin  Goal of Therapy:  INR 2.5 - 3.0 Monitor platelets by anticoagulation protocol: Yes   Plan:  Start heparin 700 units/hr Check heparin level in 8 hours.  Geannie Risen, PharmD, BCPS

## 2023-09-07 NOTE — Progress Notes (Signed)
PHARMACY - ANTICOAGULATION  Pharmacy Consult for heparin Indication: atrial fibrillation Brief A/P: INR < 2.5  Will start heparin  No Known Allergies  Patient Measurements: Height: 4\' 11"  (149.9 cm) Weight: 49.6 kg (109 lb 5.6 oz) IBW/kg (Calculated) : 43.2  Vital Signs: Temp: 97.8 F (36.6 C) (11/14 1719) Temp Source: Oral (11/14 1719) BP: 141/74 (11/14 1719) Pulse Rate: 83 (11/14 1719)  Labs: Recent Labs    09/05/23 0421 09/06/23 0211 09/06/23 0623 09/07/23 0242 09/07/23 0857 09/07/23 1120  HGB 13.1  --  13.4 13.4 13.8  --   HCT 39.8  --  41.7 41.4 42.0  --   PLT 191  --  215 207 215  --   LABPROT 29.8* 40.8*  --  22.5*  --   --   INR 2.8* 4.2*  --  2.0*  --   --   HEPARINUNFRC  --   --   --   --   --  0.28*  CREATININE 0.91  --  1.18* 0.76  --   --     Estimated Creatinine Clearance: 48.5 mL/min (by C-G formula based on SCr of 0.76 mg/dL).   Assessment: 62 y.o. female with g/o Afib, Coumadin on hold for cath and INR < 2.5. Was bridged with IV heparin prior to cath.   11/14 PM: s/p cath. Discussed with Cyndi Bender, NP, per Dr. Odis Hollingshead, ok to resume warfarin tonight and no heparin bridge required.  Goal of Therapy:  INR 2.5 - 3.0 Monitor platelets by anticoagulation protocol: Yes   Plan:  Warfarin 3.75mg  PO x 1 dose tonight Daily PT/INR  Loralee Pacas, PharmD, BCPS 09/07/2023 6:02 PM  Please check AMION for all [facility] Pharm[facility] phone numbers After 10:00 PM, call Main Pharmacy [phone]

## 2023-09-07 NOTE — Progress Notes (Signed)
Mobility Specialist Progress Note:   09/07/23 1000  Oxygen Therapy  O2 Device Nasal Cannula  O2 Flow Rate (L/min) 1 L/min  Mobility  Activity Ambulated with assistance in hallway  Level of Assistance Contact guard assist, steadying assist  Assistive Device Front wheel walker  Distance Ambulated (ft) 175 ft  Activity Response Tolerated well  Mobility Referral Yes  $Mobility charge 1 Mobility  Mobility Specialist Start Time (ACUTE ONLY) [id number]  Mobility Specialist Stop Time (ACUTE ONLY) 0951  Mobility Specialist Time Calculation (min) (ACUTE ONLY) 14 min    Pre Mobility: 70 HR,  94% SpO2 Post Mobility:  70 HR,  96% SpO2  Pt received in bed, agreeable to mobility. Asymptomatic w/ no complaints throughout and VSS. Returned to room w/o fault. Pt left in bed with call bell and all needs met.  [name] Mobility Specialist Please contact via Special educational needs teacher or Rehab office at [phone]

## 2023-09-07 NOTE — Progress Notes (Signed)
Pharmacy paged question if heparin gtt need to be resumed tonight. Discussed with rounding team Dr Odis Hollingshead, recommend transition back to coumadin, no further heparin needed, pharmacy to manage anticoagulation therapy, this was informed to pharmacy team.

## 2023-09-07 NOTE — Progress Notes (Signed)
PHARMACY - ANTICOAGULATION  Pharmacy Consult for heparin Indication: atrial fibrillation Brief A/P: INR < 2.5  Will start heparin  No Known Allergies  Patient Measurements: Height: 4\' 11"  (149.9 cm) Weight: 49.6 kg (109 lb 5.6 oz) IBW/kg (Calculated) : 43.2  Vital Signs: Temp: 97.7 F (36.5 C) (11/14 1123) Temp Source: Oral (11/14 1123) BP: 112/75 (11/14 1123) Pulse Rate: 87 (11/14 1123)  Labs: Recent Labs    09/04/23 1335 09/05/23 0421 09/05/23 0421 09/06/23 0211 09/06/23 0623 09/07/23 0242 09/07/23 0857 09/07/23 1120  HGB  --  13.1   < >  --  13.4 13.4 13.8  --   HCT  --  39.8   < >  --  41.7 41.4 42.0  --   PLT  --  191   < >  --  215 207 215  --   LABPROT  --  29.8*  --  40.8*  --  22.5*  --   --   INR  --  2.8*  --  4.2*  --  2.0*  --   --   HEPARINUNFRC  --   --   --   --   --   --   --  0.28*  CREATININE  --  0.91  --   --  1.18* 0.76  --   --   TROPONINIHS 8  --   --   --   --   --   --   --    < > = values in this interval not displayed.    Estimated Creatinine Clearance: 48.5 mL/min (by C-G formula based on SCr of 0.76 mg/dL).   Assessment: 62 y.o. female with g/o Afib, Coumadin on hold for cath and INR < 2.5. Heparin level came back slightly subtherapeutic today. We will increase rate and check level later or cath.   Goal of Therapy:  INR 2.5 - 3.0 Monitor platelets by anticoagulation protocol: Yes   Plan:  Increase heparin 750 units/hr Check heparin level in 6 hours or cath  Ulyses Southward, PharmD, BCIDP, AAHIVP, CPP Infectious Disease Pharmacist 09/07/2023 1:17 PM

## 2023-09-07 NOTE — Interval H&P Note (Signed)
History and Physical Interval Note:  09/07/2023 2:09 PM  Christy Poole  has presented today for surgery, with the diagnosis of chest pain - mitral stenosis.  The various methods of treatment have been discussed with the patient and family. After consideration of risks, benefits and other options for treatment, the patient has consented to  Procedure(s): RIGHT/LEFT HEART CATH AND CORONARY ANGIOGRAPHY (N/A) as a surgical intervention.  The patient's history has been reviewed, patient examined, no change in status, stable for surgery.  I have reviewed the patient's chart and labs.  Questions were answered to the patient's satisfaction.     Orbie Pyo

## 2023-09-07 NOTE — Progress Notes (Signed)
PROGRESS NOTE    Christy Poole  HQI:[id number] DOB: October 09, 1961 DOA: 09/04/2023 PCP: de [place], Raymond J, MD  Chief Complaint  Patient presents with   Cough   Chest Pain   Shortness of Breath    Hospital Course:  Christy Poole is a 62 y.o. 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 Patient followed up with a pulmonologist and was diagnosed with chronic bronchitis, she was given Dulera at that time. CT imaging over this year also noted mediastinal lymphadenopathy currently being followed with repeat CT in 6 months. Patient also has known history of rheumatic mitral stenosis.  Last echo in August of this year showed moderate to severe mitral stenosis with mildly reduced RV function.  Does have history of balloon valvuloplasty.  On presentation to ED, mildly tachypneic and tachycardic, labs with leukocytosis at 12.8, PT and INR elevated at 30.6 and 2.9 respectively, on Coumadin. Troponin negative x 2. BNP elevated to 283. Respiratory panel for flu/COVID/RSV negative. Chest x-ray with increased interstitial markings favoring edema/CHF but unable to exclude superimposed pneumonitis.  EKG shows A-fib with RVR.  Patient received Solu-Medrol, magnesium, continuous albuterol in the ED. Cardiology was also consulted.  Echocardiogram was ordered. 11/12: Vital stable, remained on room air.  Labs with INR 2.8,Procalcitonin negative,  worsening leukocytosis at 13.4 but patient also received steroid,  blood glucose elevated above 200,  UA with mild image urea and rare bacteria. RVP negative.  Echocardiogram with normal EF, flattening of interventricular septum during systole consistent with right ventricular overload, mildly reduced RV function, severely  dilated left atrium and severe mitral stenosis. Cardiology thinks that her symptoms are mostly pulmonary and should be addressed first before taking care of mitral stenosis.  Started on Z-Pak and Tussionex.  Patient remained in A-fib-starting on Cardizem infusion as recommended by cardiology. 11/13: INR rose to 4.2 likely from Keppra and Z-Pak.  Pharmacy consulted.  Held coumadin and given dose of vit K. Afib RVR again in the evening, responsive to IVP Metop 11/14: INR down to 2.0 this AM.  O2 sats in the 92-95.  Patient feels more comfortable 2 L O2.  No episodes of RVR overnight.  Going for cath today  Subjective: No acute complaints this morning.  Continues to improve.  Reports some intermittent episodes of dyspnea, they are not provoked by movement or exertion.  Feel comfortable at rest.  Objective: Vitals:   09/07/23 0014 09/07/23 0328 09/07/23 0512 09/07/23 0734  BP: 124/78 125/77  (!) 146/87  Pulse: 80 75  73  Resp: 15 13  15   Temp: 97.6 F (36.4 C) 97.7 F (36.5 C)  98.7 F (37.1 C)  TempSrc: Oral Oral  Oral  SpO2: 92% 96%  94%  Weight:   49.6 kg   Height:        Intake/Output Summary (Last 24 hours) at 09/07/2023 0739 Last data filed at 09/07/2023 0542 Gross per 24 hour  Intake 163.28 ml  Output 800 ml  Net -636.72 ml   Filed Weights   09/04/23 1156 09/06/23 0620 09/07/23 0512  Weight: 49.9 kg 49.5 kg 49.6 kg    Examination:  General exam: Appears calm and comfortable  Respiratory system: Coughing, some upper airway rales Cardiovascular system: irregularly irregular, rate 70s, +murmur  Gastrointestinal system: Abdomen is nondistended, soft and nontender. No organomegaly  or masses felt. Normal bowel sounds heard. Central nervous system: Alert and oriented. No focal neurological deficits. Extremities: Symmetric 5 x 5 power. Skin: No rashes, lesions or ulcers Psychiatry: Judgement and insight appear normal. Mood & affect appropriate.   Assessment & Plan:    Principal Problem:   Respiratory distress Active Problems:   Permanent atrial fibrillation (HCC)   Rheumatic mitral stenosis   Diabetes mellitus (HCC)   History of stroke   Dyslipidemia   Hypothyroidism   Cough   Dyspnea on exertion   Precordial chest pain   Renal insufficiency   Supratherapeutic INR     Assessment and Plan: * Respiratory distress Chronic cough/chronic pneumonitis/chronic bronchitis. Patient presented with worsening dyspnea, some element of orthopnea, intermittent chest pain which is mostly related to cough but sometimes with exertion.  Has seen a pulmonologist outpt and working diagnosis of pneumonitis versus chronic bronchitis. Respiratory viral panel, COVID, influenza and RSV negative.  Procalcitonin negative.   -RN reporting Intermittently requiring 1-2L O2, though suspect this is for comfort as she maintains sats >90% on room air. -Dyspnea is likely multifactorial given history of chronic bronchitis as well as worsening mitral stenosis with orthopnea - Concern of congestive heart failure secondary to severe MS, echocardiogram with normal EF and indeterminate diastolic function due to A-fib with RVR. Cont with daily lasix. Clinically appears euvolemic. May repeat CXR if worsening orthopnea or increasing O2 requirement. -Continue with prednisone, nebs. Prednisone taper starting tomorrow. -Continue with azithromycin, end date 11/17 -Continue home dose Dulera -Continue Robitussin   Permanent atrial fibrillation with RVR (HCC) Initially resolved with Cardizem drip. Required IVP Metop on 11/13. No further episodes today. EF preserved Cardiology consulted and following Continue current dose Lopressor  On warfarin anticoagulation Therapeutic on arrival then supratherapeutic 11/13 which was likely secondary to newly added azithromycin. 11/13 received vitamin K.   Pharmacy has been consulted.  They will manage warfarin.   11/14 INR down to 2.0. Will resume  warfarin per pharm recs and cont outpt follow up for titration.   Rheumatic mitral stenosis S/p valvuloplasty in the past.  Echocardiogram with worsening stenosis, severely dilated left atrium and concern of some pulmonary vascular congestion. - Cont lasix -Cardiology is on board -Planning for heart cath today. Will follow recs.   Diabetes mellitus (HCC) -Continue with SSI, hold long acting for now as bedside RN reports minimal intake yesterday and pt has been NPO today.   History of stroke -Continue home aspirin and Coumadin as above   Dyslipidemia -Continue home atorvastatin   Hypothyroidism -Continue home Synthroid  Complex partial seizures -Follows with neurology outpatient -Continue home dose Keppra  Leukocytosis 2/2 to steroid use. Peaked at [facility] on 11/13, downtrending now without further intervention Patient remains afebrile, procalcitonin unremarkable.   Will continue to monitor.  No signs or symptoms of other infection at this time.   Continue azithromycin for bronchitis treatment as above.   Patient is a [place] speaking and translator was used for the entirety of our interaction.  DVT prophylaxis: Coumadin Code Status: Full Family Communication:  Disposition:   Status is: Inpatient, pending heart cath today    Consultants:  Cardiology, Dr. Odis Hollingshead  Procedures:  Cath tomorrow  Antimicrobials:  Anti-infectives (From admission, onward)    Start     Dose/Rate Route Frequency Ordered Stop   09/06/23 1000  azithromycin (ZITHROMAX) tablet 250 mg       Placed in "Followed by" Linked Group   250 mg Oral Daily 09/05/23 0758 09/10/23  1478   09/05/23 1000  azithromycin (ZITHROMAX) tablet 500 mg       Placed in "Followed by" Linked Group   500 mg Oral Daily 09/05/23 0758 09/05/23 0812       Data Reviewed: I have personally reviewed following labs and imaging studies  CBC: Recent Labs  Lab 09/04/23 1144 09/05/23 0421 09/06/23 0623 09/07/23 0242  WBC  12.8* 13.4* 21.1* 15.8*  HGB 13.3 13.1 13.4 13.4  HCT 41.6 39.8 41.7 41.4  MCV 89.8 86.9 87.1 86.1  PLT 188 191 215 207    Basic Metabolic Panel: Recent Labs  Lab 09/04/23 1144 09/04/23 1335 09/05/23 0421 09/06/23 0623 09/07/23 0242  NA 138  --  135 136 133*  K 4.0  --  3.8 4.4 4.8  CL 104  --  100 102 99  CO2 23  --  23 26 27   GLUCOSE 140*  --  249* 130* 144*  BUN 21  --  19 47* 40*  CREATININE 0.80  --  0.91 1.18* 0.76  CALCIUM 9.7  --  9.8 9.8 9.5  MG  --  2.0  --   --   --     GFR: Estimated Creatinine Clearance: 48.5 mL/min (by C-G formula based on SCr of 0.76 mg/dL).  Liver Function Tests: Recent Labs  Lab 09/05/23 0421  AST 21  ALT 19  ALKPHOS 72  BILITOT 0.8  PROT 7.8  ALBUMIN 3.5    CBG: Recent Labs  Lab 09/06/23 0609 09/06/23 1057 09/06/23 1641 09/06/23 2130 09/07/23 0623  GLUCAP 154* 94 161* 382* 123*     Recent Results (from the past 240 hour(s))  Resp panel by RT-PCR (RSV, Flu A&B, Covid) Anterior Nasal Swab     Status: None   Collection Time: 09/04/23  1:20 PM   Specimen: Anterior Nasal Swab  Result Value Ref Range Status   SARS Coronavirus 2 by RT PCR NEGATIVE NEGATIVE Final   Influenza A by PCR NEGATIVE NEGATIVE Final   Influenza B by PCR NEGATIVE NEGATIVE Final    Comment: (NOTE) The Xpert Xpress SARS-CoV-2/FLU/RSV plus assay is intended as an aid in the diagnosis of influenza from Nasopharyngeal swab specimens and should not be used as a sole basis for treatment. Nasal washings and aspirates are unacceptable for Xpert Xpress SARS-CoV-2/FLU/RSV testing.  Fact Sheet for Patients: BloggerCourse.com  Fact Sheet for Healthcare Providers: SeriousBroker.it  This test is not yet approved or cleared by the [place] FDA and has been authorized for detection and/or diagnosis of SARS-CoV-2 by FDA under an Emergency Use Authorization (EUA). This EUA will remain in effect (meaning  this test can be used) for the duration of the COVID-19 declaration under Section 564(b)(1) of the Act, 21 U.S.C. section 360bbb-3(b)(1), unless the authorization is terminated or revoked.     Resp Syncytial Virus by PCR NEGATIVE NEGATIVE Final    Comment: (NOTE) Fact Sheet for Patients: BloggerCourse.com  Fact Sheet for Healthcare Providers: SeriousBroker.it  This test is not yet approved or cleared by the [place] FDA and has been authorized for detection and/or diagnosis of SARS-CoV-2 by FDA under an Emergency Use Authorization (EUA). This EUA will remain in effect (meaning this test can be used) for the duration of the COVID-19 declaration under Section 564(b)(1) of the Act, 21 U.S.C. section 360bbb-3(b)(1), unless the authorization is terminated or revoked.  Performed at [facility], [address], [place], [place] [address]   Respiratory (~20 pathogens) panel by PCR  Status: None   Collection Time: 09/04/23  5:04 PM   Specimen: Nasopharyngeal Swab; Respiratory  Result Value Ref Range Status   Adenovirus NOT DETECTED NOT DETECTED Final   Coronavirus 229E NOT DETECTED NOT DETECTED Final    Comment: (NOTE) The Coronavirus on the Respiratory Panel, DOES NOT test for the novel  Coronavirus (2019 nCoV)    Coronavirus HKU1 NOT DETECTED NOT DETECTED Final   Coronavirus NL63 NOT DETECTED NOT DETECTED Final   Coronavirus OC43 NOT DETECTED NOT DETECTED Final   Metapneumovirus NOT DETECTED NOT DETECTED Final   Rhinovirus / Enterovirus NOT DETECTED NOT DETECTED Final   Influenza A NOT DETECTED NOT DETECTED Final   Influenza B NOT DETECTED NOT DETECTED Final   Parainfluenza Virus 1 NOT DETECTED NOT DETECTED Final   Parainfluenza Virus 2 NOT DETECTED NOT DETECTED Final   Parainfluenza Virus 3 NOT DETECTED NOT DETECTED Final   Parainfluenza Virus 4 NOT DETECTED NOT DETECTED Final   Respiratory Syncytial Virus NOT  DETECTED NOT DETECTED Final   Bordetella pertussis NOT DETECTED NOT DETECTED Final   Bordetella Parapertussis NOT DETECTED NOT DETECTED Final   Chlamydophila pneumoniae NOT DETECTED NOT DETECTED Final   Mycoplasma pneumoniae NOT DETECTED NOT DETECTED Final    Comment: Performed at [facility], [address], [place], [place] [address]  MRSA Next Gen by PCR, Nasal     Status: None   Collection Time: 09/05/23  2:22 PM   Specimen: Nasal Mucosa; Nasal Swab  Result Value Ref Range Status   MRSA by PCR Next Gen NOT DETECTED NOT DETECTED Final    Comment: (NOTE) The GeneXpert MRSA Assay (FDA approved for NASAL specimens only), is one component of a comprehensive MRSA colonization surveillance program. It is not intended to diagnose MRSA infection nor to guide or monitor treatment for MRSA infections. Test performance is not FDA approved in patients less than 19 years old. Performed at [facility], [address] [place], [place] [address]          Radiology Studies: No results found.      Scheduled Meds:  atorvastatin  80 mg Oral Daily   azithromycin  250 mg Oral Daily   chlorpheniramine-HYDROcodone  5 mL Oral Q12H   furosemide  20 mg Intravenous Daily   gabapentin  100 mg Oral QHS   insulin aspart  0-15 Units Subcutaneous TID WC   insulin aspart  0-5 Units Subcutaneous QHS   levETIRAcetam  250 mg Oral BID   levothyroxine  50 mcg Oral Q0600   metoprolol tartrate  50 mg Oral BID   mometasone-formoterol  2 puff Inhalation BID   predniSONE  50 mg Oral Q breakfast   sodium chloride flush  3 mL Intravenous Q12H   Warfarin - Pharmacist Dosing Inpatient   Does not apply q1600   Continuous Infusions:  heparin 700 Units/hr (09/07/23 0507)     LOS: 2 days    Time spent:     Debarah Crape, DO [facility]   To contact the attending provider between 7A-7P or the covering provider during after hours 7P-7A, please log into the web site  [email or web address] and access using universal [place] password for that web site. If you do not have the password, please call the hospital operator.  09/07/2023, 7:39 AM

## 2023-09-08 ENCOUNTER — Inpatient Hospital Stay (HOSPITAL_COMMUNITY): Payer: Medicaid Other

## 2023-09-08 ENCOUNTER — Encounter (HOSPITAL_COMMUNITY): Payer: Self-pay | Admitting: Internal Medicine

## 2023-09-08 DIAGNOSIS — R0603 Acute respiratory distress: Secondary | ICD-10-CM | POA: Diagnosis not present

## 2023-09-08 DIAGNOSIS — R0609 Other forms of dyspnea: Secondary | ICD-10-CM | POA: Diagnosis not present

## 2023-09-08 DIAGNOSIS — R053 Chronic cough: Secondary | ICD-10-CM

## 2023-09-08 DIAGNOSIS — I050 Rheumatic mitral stenosis: Secondary | ICD-10-CM | POA: Diagnosis not present

## 2023-09-08 DIAGNOSIS — I4821 Permanent atrial fibrillation: Secondary | ICD-10-CM | POA: Diagnosis not present

## 2023-09-08 DIAGNOSIS — N179 Acute kidney failure, unspecified: Secondary | ICD-10-CM | POA: Diagnosis not present

## 2023-09-08 LAB — BASIC METABOLIC PANEL
Anion gap: 11 (ref 5–15)
BUN: 35 mg/dL — ABNORMAL HIGH (ref 8–23)
CO2: 27 mmol/L (ref 22–32)
Calcium: 10 mg/dL (ref 8.9–10.3)
Chloride: 98 mmol/L (ref 98–111)
Creatinine, Ser: 1.06 mg/dL — ABNORMAL HIGH (ref 0.44–1.00)
GFR, Estimated: 59 mL/min — ABNORMAL LOW (ref >60)
Glucose, Bld: 151 mg/dL — ABNORMAL HIGH (ref 70–99)
Potassium: 3.7 mmol/L (ref 3.5–5.1)
Sodium: 136 mmol/L (ref 135–145)

## 2023-09-08 LAB — GLUCOSE, CAPILLARY
Glucose-Capillary: 97 mg/dL (ref 70–99)
Glucose-Capillary: 161 mg/dL — ABNORMAL HIGH (ref 70–99)
Glucose-Capillary: 322 mg/dL — ABNORMAL HIGH (ref 70–99)
Glucose-Capillary: 253 mg/dL — ABNORMAL HIGH (ref 70–99)

## 2023-09-08 LAB — PROTIME-INR
INR: 1.3 — ABNORMAL HIGH (ref 0.8–1.2)
Prothrombin Time: 16.2 s — ABNORMAL HIGH (ref 11.4–15.2)

## 2023-09-08 LAB — EXPECTORATED SPUTUM ASSESSMENT W GRAM STAIN, RFLX TO RESP C

## 2023-09-08 LAB — CBC
HCT: 39.7 % (ref 36.0–46.0)
Hemoglobin: 13.1 g/dL (ref 12.0–15.0)
MCH: 28.5 pg (ref 26.0–34.0)
MCHC: 33 g/dL (ref 30.0–36.0)
MCV: 86.3 fL (ref 80.0–100.0)
Platelets: 230 10*3/uL (ref 150–400)
RBC: 4.6 MIL/uL (ref 3.87–5.11)
RDW: 13.6 % (ref 11.5–15.5)
WBC: 14.2 10*3/uL — ABNORMAL HIGH (ref 4.0–10.5)
nRBC: 0 % (ref 0.0–0.2)

## 2023-09-08 LAB — BRAIN NATRIURETIC PEPTIDE: B Natriuretic Peptide: 129.1 pg/mL — ABNORMAL HIGH (ref 0.0–100.0)

## 2023-09-08 MED ORDER — FAMOTIDINE 20 MG PO TABS
20.0000 mg | ORAL_TABLET | Freq: Two times a day (BID) | ORAL | Status: DC
  Administered 2023-09-08 – 2023-09-13 (×11): 20 mg via ORAL
  Filled 2023-09-08 (×11): qty 1

## 2023-09-08 MED ORDER — BENZONATATE 100 MG PO CAPS
100.0000 mg | ORAL_CAPSULE | Freq: Three times a day (TID) | ORAL | Status: DC | PRN
  Administered 2023-09-08 – 2023-09-10 (×3): 100 mg via ORAL
  Filled 2023-09-08 (×3): qty 1

## 2023-09-08 MED ORDER — WARFARIN SODIUM 5 MG PO TABS
5.0000 mg | ORAL_TABLET | Freq: Once | ORAL | Status: AC
  Administered 2023-09-08: 5 mg via ORAL
  Filled 2023-09-08: qty 1

## 2023-09-08 MED ORDER — METOPROLOL SUCCINATE ER 100 MG PO TB24
100.0000 mg | ORAL_TABLET | Freq: Every day | ORAL | Status: DC
  Administered 2023-09-08 – 2023-09-09 (×2): 100 mg via ORAL
  Filled 2023-09-08 (×2): qty 1

## 2023-09-08 MED ORDER — FUROSEMIDE 10 MG/ML IJ SOLN
40.0000 mg | Freq: Every day | INTRAMUSCULAR | Status: DC
Start: 2023-09-08 — End: 2023-09-11
  Administered 2023-09-08 – 2023-09-11 (×4): 40 mg via INTRAVENOUS
  Filled 2023-09-08 (×4): qty 4

## 2023-09-08 MED ORDER — ENOXAPARIN SODIUM 60 MG/0.6ML IJ SOSY
50.0000 mg | PREFILLED_SYRINGE | Freq: Two times a day (BID) | INTRAMUSCULAR | Status: DC
  Administered 2023-09-08 – 2023-09-10 (×5): 50 mg via SUBCUTANEOUS
  Filled 2023-09-08 (×6): qty 0.6

## 2023-09-08 NOTE — Plan of Care (Signed)

## 2023-09-08 NOTE — Progress Notes (Signed)
Patient Name: Christy Poole Date of Encounter: 09/08/2023 [facility] Cardiologist: Jodelle Red, MD   Interval Summary  .    Patient sitting upright-at 45 degrees. Denies chest pain. Shortness of breath is improving. Continues to have productive cough-sputum color yellow/green. Currently on antibiotics. No family at bedside  Vital Signs .    Vitals:   09/08/23 0623 09/08/23 0726 09/08/23 0819 09/08/23 1140  BP:  (!) 138/91  139/87  Pulse:  (!) 103 96   Resp:  18 20   Temp:  98.3 F (36.8 C)  97.9 F (36.6 C)  TempSrc:  Oral  Axillary  SpO2:  (!) 89% 92%   Weight: 48.3 kg     Height:        Intake/Output Summary (Last 24 hours) at 09/08/2023 1249 Last data filed at 09/08/2023 1140 Gross per 24 hour  Intake 720 ml  Output 500 ml  Net 220 ml      09/08/2023    6:23 AM 09/07/2023    5:12 AM 09/06/2023    6:20 AM  Last 3 Weights  Weight (lbs) 106 lb 7.7 oz 109 lb 5.6 oz 109 lb 2 oz  Weight (kg) 48.3 kg 49.6 kg 49.5 kg      Telemetry/ECG    Afib controlled ventricular rate - Personally Reviewed  Physical Exam .   GEN: No acute distress.   Neck: + JVD (improving) Cardiac: Irregularly irregular, variable S1-S2, diastolic murmur heard at the apex,  Respiratory: Inspiratory effort is poor, rhonchi's bilaterally. GI: Soft, nontender, non-distended  MS: No edema  Cardiac Database .   Left heart catheterization 09/07/2023 1.  Minimal nonobstructive coronary artery disease with patent mid LAD stents. 2.  Fick cardiac output of 4.8 L/min and Fick cardiac index of 3.3 L/min/m with the following hemodynamics:             Right atrial pressure mean of 15 mmHg             Right ventricular pressure 46/21 with an end-diastolic pressure of 14 mmHg             PA pressure 47/33 with a mean of 37 mmHg             Wedge pressure mean of 23 mmHg             PVR of 2.9 Woods units             PA pulsatility index of 0.9 consistent with right  ventricular dysfunction 3.  LVEDP of 22 mmHg   Recommendation: Continue evaluation and treatment of severe recurrent rheumatic mitral stenosis per cardiology rounding team.  Echocardiogram 09/04/2023:  1. Left ventricular ejection fraction, by estimation, is 55 to 60%. The  left ventricle has normal function. The left ventricle has no regional  wall motion abnormalities. indeterminate due to atrial fibrillation. There  is the interventricular septum is  flattened in systole, consistent with right ventricular pressure overload.   2. Right ventricular systolic function is mildly reduced. The right  ventricular size is normal. There is normal pulmonary artery systolic  pressure.   3. Left atrial size was severely dilated.   4. Difficult to assess given atrial fibrillation with rapid rate but  appears MS is severe by mean gradient ( ) and by PHT (1.2-1.4cm^2).  The mitral valve is rheumatic. Trivial mitral valve regurgitation. Severe  mitral stenosis. The mean mitral  valve gradient is 11.2 mmHg.   5. The aortic valve  is tricuspid. Aortic valve regurgitation is not  visualized.   6. The inferior vena cava is normal in size with <50% respiratory  variability, suggesting right atrial pressure of 8 mmHg.    Assessment & Plan .    Impression: Dyspnea on exertion. Precordial pain concerning for cardiac etiology. A-fib with RVR -now controlled ventricular rate Mitral stenosis with history of balloon valvuloplasty -monitor clinically as outpatient History of stroke Diabetes mellitus type 2, non-insulin-dependent  Recommendations: Dyspnea on exertion. Rheumatic mitral stenosis The patient has classic anginal discomfort however, the discomfort could be secondary to organic/obstructive CAD versus supply demand ischemia in the setting of underlying mitral stenosis and A-fib with RVR.    Patient underwent left heart catheterization 09/07/2023-no significant CAD and prior interventions  are patent.  Right heart catheterization consistent with elevated filling pressures.  Increased Lasix to 40 mg IV push daily.  Strict I's and O's, daily weights. Net IO Since Admission: -365.37 mL [09/08/23 1249]  She also has a productive cough which is yellow/green in color.  Currently on antibiotics as well.  Will defer infectious workup to primary team.  Patient is feeling being bridged back to Coumadin with Lovenox.  Given her symptoms, echo findings, and right heart catheterization patient needs to have mitral valve replacement.  Given her leukocytosis, productive cough, recommend treating her pneumonitis/bronchitis.  Morning BMP and BNP pending.  Acute kidney injury:  Baseline serum creatinine 0.7 Creatinine on arrival 0.8 mg/dL. Creatinine this morning 1.2 mg/dL Increased IV Lasix from 20 mg daily to 40 mg daily.   Monitor BUN and creatinine  Net IO Since Admission: -365.37 mL [09/08/23 1249]  Atrial fibrillation, permanent Transition Lopressor 50 mg p.o. twice daily to Toprol-XL 100 mg p.o. daily. Telemetry notes rate controlled atrial fibrillation Thromboembolic prophylaxis: Coumadin, subtherapeutic INR this morning, spoke to pharmacy will initiate Lovenox.  Respiratory distress Chronic cough/bronchitis:  Likely secondary to valvular heart disease but infectious process cannot be ruled out.  Improving but not back to baseline Still requires [place] oxygen.  Continues to yellow/green sputum production - consider culturing the sputum.  Will defer workup to primary team Chest x-ray not consistent with vascular congestion or pneumonia.  History of TIA: Continue aspirin and statin therapy  Non-insulin-dependent diabetes: Glycemic control per primary team.  Dyslipidemia: Continue statin therapy.  I spoke to her in her native language during today's encounter.  I also called her son Christy Poole and spoke to him over the phone and updated her with her clinical status and the findings  of her left heart catheterization.  Her son states that his grandfather is a cardiologist practicing in [place] and will review the findings with him but he to agrees that mitral valve replacement is inevitable.  Recommendations conveyed to attending physician as well.  Cardiology will continue to follow during the weekend.  For questions or updates, please contact [facility] Please consult [email or web address] for contact info under     Signed, Tessa Lerner, DO, [facility]  [facility]  [address] [place], [place] [address] Pager: [phone] Office: [phone] 09/08/23 12:49 PM

## 2023-09-08 NOTE — Progress Notes (Signed)
Mobility Specialist Progress Note:    09/08/23 1300  Mobility  Activity Refused mobility  Mobility Specialist Start Time (ACUTE ONLY) 1320   Pt refused mobility d/t painful cough. Will f/u as able.    Christy Poole Mobility Specialist Please contact via Special educational needs teacher or Rehab office at [phone]

## 2023-09-08 NOTE — Progress Notes (Addendum)
PHARMACY - ANTICOAGULATION  Pharmacy Consult for Coumadin/Lovenox Indication: atrial fibrillation  No Known Allergies  Patient Measurements: Height: 4\' 11"  (149.9 cm) Weight: 48.3 kg (106 lb 7.7 oz) IBW/kg (Calculated) : 43.2  Vital Signs: Temp: 97.9 F (36.6 C) (11/15 1140) Temp Source: Axillary (11/15 1140) BP: 139/87 (11/15 1140) Pulse Rate: 96 (11/15 0819)  Labs: Recent Labs    09/06/23 0211 09/06/23 0623 09/06/23 0623 09/07/23 0242 09/07/23 0857 09/07/23 1120 09/07/23 1615 09/07/23 1619 09/07/23 1621 09/08/23 0241  HGB  --  13.4   < > 13.4 13.8  --    < > 13.6 13.3 13.1  HCT  --  41.7   < > 41.4 42.0  --    < > 40.0 39.0 39.7  PLT  --  215   < > 207 215  --   --   --   --  230  LABPROT 40.8*  --   --  22.5*  --   --   --   --   --  16.2*  INR 4.2*  --   --  2.0*  --   --   --   --   --  1.3*  HEPARINUNFRC  --   --   --   --   --  0.28*  --   --   --   --   CREATININE  --  1.18*  --  0.76  --   --   --   --   --   --    < > = values in this interval not displayed.    Estimated Creatinine Clearance: 48.5 mL/min (by C-G formula based on SCr of 0.76 mg/dL).   Assessment: 62 y.o. female with g/o Afib, Coumadin on hold for cath and INR < 2.5. Was bridged with IV heparin prior to cath.   S/p cath yesterday. Coumadin resumed without heparin bridge. INR came back 1.3 today.   Addendum  Dr Odis Hollingshead decided to bridge with Lovenox today until INR therapeutic.   Goal of Therapy:  INR 2.5 - 3.0 Monitor platelets by anticoagulation protocol: Yes   Plan:  Warfarin 5mg  PO x1 Lovenox 50mg  SQ BID Daily PT/INR  Ulyses Southward, PharmD, BCIDP, AAHIVP, CPP Infectious Disease Pharmacist 09/08/2023 11:46 AM

## 2023-09-08 NOTE — Progress Notes (Signed)
PROGRESS NOTE    Christy Poole  NWG:[id number] DOB: [birth year] DOA: 09/04/2023 PCP: de [place], Raymond J, MD  Chief Complaint  Patient presents with   Cough   Chest Pain   Shortness of Breath    Hospital Course:  Christy Poole is a 62 y.o. 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 Patient followed up with a pulmonologist and was diagnosed with chronic bronchitis, she was given Dulera at that time. CT imaging over this year also noted mediastinal lymphadenopathy currently being followed with repeat CT in 6 months. Patient also has known history of rheumatic mitral stenosis.  Last echo in August of this year showed moderate to severe mitral stenosis with mildly reduced RV function.  Does have history of balloon valvuloplasty.  On presentation to ED, mildly tachypneic and tachycardic, labs with leukocytosis at 12.8, PT and INR elevated at 30.6 and 2.9 respectively, on Coumadin. Troponin negative x 2. BNP elevated to 283. Respiratory panel for flu/COVID/RSV negative. Chest x-ray with increased interstitial markings favoring edema/CHF but unable to exclude superimposed pneumonitis.  EKG shows A-fib with RVR.  Patient received Solu-Medrol, magnesium, continuous albuterol in the ED. Cardiology was also consulted.  Echocardiogram was ordered. 11/12: Vital stable, remained on room air.  Labs with INR 2.8,Procalcitonin negative,  worsening leukocytosis at 13.4 but patient also received steroid,  blood glucose elevated above 200,  UA with mild image urea and rare bacteria. RVP negative.  Echocardiogram with normal EF, flattening of interventricular septum during systole consistent with right ventricular overload, mildly reduced RV function, severely  dilated left atrium and severe mitral stenosis. Cardiology thinks that her symptoms are mostly pulmonary and should be addressed first before taking care of mitral stenosis.  Started on Z-Pak and Tussionex.  Patient remained in A-fib-starting on Cardizem infusion as recommended by cardiology. 11/13: INR rose to 4.2 likely from Keppra and Z-Pak.  Pharmacy consulted.  Held coumadin and given dose of vit K. Afib RVR again in the evening, responsive to IVP Metop 11/14: INR down to 2.0 this AM.  O2 sats in the 92-95.  Patient feels more comfortable 2 L O2.  No episodes of RVR overnight.  Cath reveals Nonobstructive coronary artery disease with patent mid LAD stents.  Subjective: Worsening hypoxia this AM, with increase sputum production.  Chest x-ray without any evidence of pulmonary edema or worsening infection.  Will send sputum for culture Patient endorses continued difficulty with coughing and is requesting additional medications.  Objective: Vitals:   09/08/23 0002 09/08/23 0400 09/08/23 0623 09/08/23 0726  BP: 111/68 (!) 119/95  (!) 138/91  Pulse: 81 73    Resp: 19 17    Temp: (!) 97.5 F (36.4 C) 97.6 F (36.4 C)  98.3 F (36.8 C)  TempSrc: Oral Oral  Oral  SpO2: 93% 92%    Weight:   48.3 kg   Height:       No intake or output data in the 24 hours ending 09/08/23 0753  Filed Weights   09/06/23 0620 09/07/23 0512 09/08/23 0623  Weight: 49.5 kg 49.6 kg 48.3 kg    Examination:  General exam: Appears calm and comfortable  Respiratory system: Coughing, some upper airway rales, green sputum appreciated  Cardiovascular system: irregularly irregular, rate 70s, +murmur  Gastrointestinal system: Abdomen is nondistended, soft and nontender. No  organomegaly or masses felt. Normal bowel sounds heard. Central nervous system: Alert and oriented. No focal neurological deficits. Extremities: Symmetric 5 x 5 power. Skin: No rashes, lesions or ulcers Psychiatry: Judgement and insight appear  normal. Mood & affect appropriate.   Assessment & Plan:   Principal Problem:   Respiratory distress Active Problems:   Permanent atrial fibrillation (HCC)   Rheumatic mitral stenosis   Diabetes mellitus (HCC)   History of stroke   Dyslipidemia   Hypothyroidism   Cough   Dyspnea on exertion   Precordial chest pain   Renal insufficiency   Supratherapeutic INR     Assessment and Plan: * Respiratory distress Chronic cough/chronic pneumonitis/chronic bronchitis. Initially presented with dyspnea.  Multifactorial.  Some elements of orthopnea with intermittent chest pain which does appear mostly related to cough. Pulmonology outpatient has been working patient up with current diagnosis of pneumonitis versus chronic bronchitis. - Respiratory viral panel, COVID, flu, RSV negative. - Increasing O2 requirement 11/15 - Send sputum for culture today given persistence - Rpt CXR 11/15 unremarkable - Will consult pulm, pt sees [facility] outpt. Appreciate recommendations.  - Pro-Cal negative - Echo reveals preserved EF however given severe MS and intermittently in A-fib with RVR there may be component of pulmonary edema though not volume overloaded on CXR.  She is on daily diuretics, which cardiology has increased today. - Continue with prednisone and nebs.  Currently tapering steroids - Has been on azithromycin concerns of chronic bronchitis, end date 11/17 -Continue home dose Dulera -Continue Robitussin   Permanent atrial fibrillation with RVR (HCC) Initially resolved with Cardizem drip. Required IVP Metop on 11/13. No further episodes. EF preserved on echo Now on metop succ 100mg  every day and doing well. Cardiology consulted and following  On warfarin anticoagulation Therapeutic on arrival then supratherapeutic 11/13 which was likely secondary to newly added azithromycin. 11/13 received vitamin K.   Pharmacy has been consulted.  They will manage warfarin.   11/14 INR down to 2.0.  Will resume warfarin per pharm recs with lovenox bridge   Rheumatic mitral stenosis S/p valvuloplasty in the past.  Echocardiogram with worsening stenosis, severely dilated left atrium and concern of some pulmonary vascular congestion. - Cont lasix -Cardiology is on board - Will need to return to MVR at later time after this acute admission   Diabetes mellitus (HCC) -Continue with SSI, hold long acting for now as bedside RN reports minimal intake and BG consistently <150  History of stroke -Continue home aspirin and Coumadin as above   Dyslipidemia -Continue home atorvastatin   Hypothyroidism -Continue home Synthroid  Complex partial seizures -Follows with neurology outpatient -Continue home dose Keppra  Leukocytosis Acute worsening 2/2 to steroid use. Peaked at [facility] on 11/13, downtrending now without further intervention Patient remains afebrile, procalcitonin unremarkable.   Will continue to monitor.   Continue azithromycin for bronchitis treatment as above.   Patient is a [place] speaking and translator was used for the entirety of our interaction.  DVT prophylaxis: Coumadin Code Status: Full Disposition: Status is: Inpatient, pending heart cath today    Consultants:  Cardiology, Dr. Odis Hollingshead Pulmonology, Dr. Isaiah Serge  Procedures:  Cath 11/14  Antimicrobials:  Anti-infectives (From admission, onward)    Start     Dose/Rate Route Frequency Ordered Stop   09/06/23 1000  azithromycin (ZITHROMAX) tablet 250 mg       Placed in "Followed by" Linked Group   250 mg Oral Daily 09/05/23 0758 09/10/23 0959   09/05/23 1000  azithromycin (ZITHROMAX) tablet 500 mg       Placed in "Followed by" Linked Group   500 mg Oral Daily 09/05/23 0758 09/05/23 0812       Data Reviewed: I have personally reviewed following labs and imaging studies  CBC: Recent Labs  Lab 09/05/23 0421 09/06/23 0623 09/07/23 0242 09/07/23 0857 09/07/23 1615 09/07/23 1619 09/07/23 1621  09/08/23 0241  WBC 13.4* 21.1* 15.8* 15.8*  --   --   --  14.2*  HGB 13.1 13.4 13.4 13.8 13.9 13.6 13.3 13.1  HCT 39.8 41.7 41.4 42.0 41.0 40.0 39.0 39.7  MCV 86.9 87.1 86.1 87.5  --   --   --  86.3  PLT 191 215 207 215  --   --   --  230    Basic Metabolic Panel: Recent Labs  Lab 09/04/23 1144 09/04/23 1335 09/05/23 0421 09/06/23 0623 09/07/23 0242 09/07/23 1615 09/07/23 1619 09/07/23 1621  NA 138  --  135 136 133* 139 141 140  K 4.0  --  3.8 4.4 4.8 4.3 4.2 4.0  CL 104  --  100 102 99  --   --   --   CO2 23  --  23 26 27   --   --   --   GLUCOSE 140*  --  249* 130* 144*  --   --   --   BUN 21  --  19 47* 40*  --   --   --   CREATININE 0.80  --  0.91 1.18* 0.76  --   --   --   CALCIUM 9.7  --  9.8 9.8 9.5  --   --   --   MG  --  2.0  --   --   --   --   --   --     GFR: Estimated Creatinine Clearance: 48.5 mL/min (by C-G formula based on SCr of 0.76 mg/dL).  Liver Function Tests: Recent Labs  Lab 09/05/23 0421  AST 21  ALT 19  ALKPHOS 72  BILITOT 0.8  PROT 7.8  ALBUMIN 3.5    CBG: Recent Labs  Lab 09/07/23 0623 09/07/23 1122 09/07/23 1717 09/07/23 2134 09/08/23 0620  GLUCAP 123* 99 152* 317* 97     Recent Results (from the past 240 hour(s))  Resp panel by RT-PCR (RSV, Flu A&B, Covid) Anterior Nasal Swab     Status: None   Collection Time: 09/04/23  1:20 PM   Specimen: Anterior Nasal Swab  Result Value Ref Range Status   SARS Coronavirus 2 by RT PCR NEGATIVE NEGATIVE Final   Influenza A by PCR NEGATIVE NEGATIVE Final   Influenza B by PCR NEGATIVE NEGATIVE Final    Comment: (NOTE) The Xpert Xpress SARS-CoV-2/FLU/RSV plus assay is intended as an aid in the diagnosis of influenza from Nasopharyngeal swab specimens and should not be used as a sole basis for treatment. Nasal washings and aspirates are unacceptable for Xpert Xpress SARS-CoV-2/FLU/RSV testing.  Fact Sheet for Patients: BloggerCourse.com  Fact Sheet for  Healthcare Providers: SeriousBroker.it  This test is not yet approved or cleared by the [place] FDA and has been authorized for detection and/or diagnosis of SARS-CoV-2 by FDA under an Emergency Use Authorization (EUA). This EUA will remain in effect (meaning this test can be used) for the duration of the COVID-19 declaration under Section 564(b)(1) of the Act, 21 U.S.C. section 360bbb-3(b)(1), unless the authorization is terminated or revoked.     Resp  Syncytial Virus by PCR NEGATIVE NEGATIVE Final    Comment: (NOTE) Fact Sheet for Patients: BloggerCourse.com  Fact Sheet for Healthcare Providers: SeriousBroker.it  This test is not yet approved or cleared by the [place] FDA and has been authorized for detection and/or diagnosis of SARS-CoV-2 by FDA under an Emergency Use Authorization (EUA). This EUA will remain in effect (meaning this test can be used) for the duration of the COVID-19 declaration under Section 564(b)(1) of the Act, 21 U.S.C. section 360bbb-3(b)(1), unless the authorization is terminated or revoked.  Performed at [facility], [address] [place], [place] [address]   Respiratory (~20 pathogens) panel by PCR     Status: None   Collection Time: 09/04/23  5:04 PM   Specimen: Nasopharyngeal Swab; Respiratory  Result Value Ref Range Status   Adenovirus NOT DETECTED NOT DETECTED Final   Coronavirus 229E NOT DETECTED NOT DETECTED Final    Comment: (NOTE) The Coronavirus on the Respiratory Panel, DOES NOT test for the novel  Coronavirus (2019 nCoV)    Coronavirus HKU1 NOT DETECTED NOT DETECTED Final   Coronavirus NL63 NOT DETECTED NOT DETECTED Final   Coronavirus OC43 NOT DETECTED NOT DETECTED Final   Metapneumovirus NOT DETECTED NOT DETECTED Final   Rhinovirus / Enterovirus NOT DETECTED NOT DETECTED Final   Influenza A NOT DETECTED NOT DETECTED Final   Influenza B  NOT DETECTED NOT DETECTED Final   Parainfluenza Virus 1 NOT DETECTED NOT DETECTED Final   Parainfluenza Virus 2 NOT DETECTED NOT DETECTED Final   Parainfluenza Virus 3 NOT DETECTED NOT DETECTED Final   Parainfluenza Virus 4 NOT DETECTED NOT DETECTED Final   Respiratory Syncytial Virus NOT DETECTED NOT DETECTED Final   Bordetella pertussis NOT DETECTED NOT DETECTED Final   Bordetella Parapertussis NOT DETECTED NOT DETECTED Final   Chlamydophila pneumoniae NOT DETECTED NOT DETECTED Final   Mycoplasma pneumoniae NOT DETECTED NOT DETECTED Final    Comment: Performed at [facility], [address], [place], [place] [address]  MRSA Next Gen by PCR, Nasal     Status: None   Collection Time: 09/05/23  2:22 PM   Specimen: Nasal Mucosa; Nasal Swab  Result Value Ref Range Status   MRSA by PCR Next Gen NOT DETECTED NOT DETECTED Final    Comment: (NOTE) The GeneXpert MRSA Assay (FDA approved for NASAL specimens only), is one component of a comprehensive MRSA colonization surveillance program. It is not intended to diagnose MRSA infection nor to guide or monitor treatment for MRSA infections. Test performance is not FDA approved in patients less than 12 years old. Performed at [facility], [address] [place], [place] [address]          Radiology Studies: CARDIAC CATHETERIZATION  Result Date: 09/07/2023   Prox LAD to Mid LAD lesion is 5% stenosed. 1.  Minimal nonobstructive coronary artery disease with patent mid LAD stents. 2.  Fick cardiac output of 4.8 L/min and Fick cardiac index of 3.3 L/min/m with the following hemodynamics:  Right atrial pressure mean of 15 mmHg  Right ventricular pressure 46/21 with an end-diastolic pressure of 14 mmHg  PA pressure 47/33 with a mean of 37 mmHg  Wedge pressure mean of 23 mmHg  PVR of 2.9 Woods units  PA pulsatility index of 0.9 consistent with right ventricular dysfunction 3.  LVEDP of 22 mmHg Recommendation: Continue evaluation and  treatment of severe recurrent rheumatic mitral stenosis per cardiology rounding team.  Scheduled Meds:  atorvastatin  80 mg Oral Daily   azithromycin  250 mg Oral Daily   chlorpheniramine-HYDROcodone  5 mL Oral Q12H   furosemide  20 mg Intravenous Daily   gabapentin  100 mg Oral QHS   insulin aspart  0-15 Units Subcutaneous TID WC   insulin aspart  0-5 Units Subcutaneous QHS   levETIRAcetam  250 mg Oral BID   levothyroxine  50 mcg Oral Q0600   metoprolol tartrate  50 mg Oral TID   mometasone-formoterol  2 puff Inhalation BID   predniSONE  40 mg Oral Q breakfast   sodium chloride flush  3 mL Intravenous Q12H   sodium chloride flush  3 mL Intravenous Q12H   spironolactone  12.5 mg Oral Daily   Warfarin - Pharmacist Dosing Inpatient   Does not apply q1600   Continuous Infusions:  sodium chloride       LOS: 3 days    Time spent:     Debarah Crape, DO [facility]   To contact the attending provider between 7A-7P or the covering provider during after hours 7P-7A, please log into the web site [email or web address] and access using universal [place] password for that web site. If you do not have the password, please call the hospital operator.  09/08/2023, 7:53 AM

## 2023-09-08 NOTE — Consult Note (Signed)
   NAME:  HEATH CAVERLY, MRN:  [id number], DOB:  1961/01/22, LOS: 3 ADMISSION DATE:  09/04/2023, CONSULTATION DATE:  09/08/2023 REFERRING MD:  Dr Rennis Chris, CHIEF COMPLAINT: Bronchitis  History of Present Illness:  Asked to see patient for bronchitis  Was unable to question patient as video interpreter was not working-no interpreter available for the language that was chosen Gujarati, audio interpreter also keeps cutting out  History of hyperlipidemia, hypothyroidism, stroke, diabetes, atrial fibrillation, chronic bronchitis mitral stenosis who had presented with worsening cough, shortness of breath, chest pain with symptoms worsening over the last 2 months  Did see Dr. Everardo All recently in the office, diagnosed with chronic bronchitis, started on [facility] -CT scan of the chest obtained showing mediastinal adenopathy, lung nodules, mosaic attenuation-mosaic attenuation was similarly present on a CAT scan that was performed in December 2023 with no significant progression Spirometry significant for restrictive disease-still needs a full PFT to ascertain  S/p cardiac catheterization 11/14-evidence of pulmonary hypertension with mean pulmonary arterial pressure of 37, pulmonary vascular resistance of 2.9  Pertinent  Medical History   Past Medical History:  Diagnosis Date   Atrial fibrillation (HCC)    Diabetes mellitus without complication (HCC)    Stroke (HCC)    Thyroid disease     Significant Hospital Events: Including procedures, antibiotic start and stop dates in addition to other pertinent events   11/14 cardiac catheterization 11/15 pulmonary consult   Interim History / Subjective:  Breathing is feeling better from documentation  Objective   Blood pressure 134/84, pulse 96, temperature (!) 96.9 F (36.1 C), temperature source Axillary, resp. rate 20, height 4\' 11"  (1.499 m), weight 48.3 kg, SpO2 92%.        Intake/Output Summary (Last 24 hours) at 09/08/2023 1728 Last data  filed at 09/08/2023 1140 Gross per 24 hour  Intake 720 ml  Output 500 ml  Net 220 ml   Filed Weights   09/06/23 0620 09/07/23 0512 09/08/23 0623  Weight: 49.5 kg 49.6 kg 48.3 kg    Examination: General: Middle-age, does not appear to be in distress HENT: Moist oral mucosa Lungs: Does have occasional rhonchi Cardiovascular: S1-S2 appreciated Abdomen: Soft, bowel sounds appreciated Neuro: Awake and interactive GU:   I reviewed previous CT showing multiple nodules, adenopathy, mosaic changes  Resolved Hospital Problem list     Assessment & Plan:  Shortness of breath -Likely multifactorial, may be related to pulmonary hypertension, underlying interstitial lung disease with abnormal findings on CT which may also be contributing to the coughing.  Pulmonary hypertension likely secondary to mitral valve stenosis -Need to have mitral valve stenosis addressed  Abnormal CT scan of the chest showing mosaic appearance which may be in the context of airway disease, interstitial disease, pulmonary venoocclusive disease -Constrictive bronchiolitis, bronchiectasis, hypersensitivity pneumonitis considerations -Will need further workup -Will need a full pulmonary function test -May require vasculitic workup -Previous CT also did show lung nodules which need radiological follow-up  Previous PFT also did reveal restrictive physiology  Recommend to complete course of antibiotics Continue bronchodilators  Should see Dr. Everardo All in follow-up for further workup -Will need a full PFT at some point and further workup can be performed in the outpatient setting   Pulmonary will follow  Virl Diamond, MD [place] [facility] Pager: See Loretha Stapler

## 2023-09-09 ENCOUNTER — Inpatient Hospital Stay (HOSPITAL_COMMUNITY): Payer: Medicaid Other

## 2023-09-09 DIAGNOSIS — R0603 Acute respiratory distress: Secondary | ICD-10-CM | POA: Diagnosis not present

## 2023-09-09 LAB — CBC
HCT: 42.3 % (ref 36.0–46.0)
Hemoglobin: 13.9 g/dL (ref 12.0–15.0)
MCH: 27.9 pg (ref 26.0–34.0)
MCHC: 32.9 g/dL (ref 30.0–36.0)
MCV: 84.8 fL (ref 80.0–100.0)
Platelets: 242 10*3/uL (ref 150–400)
RBC: 4.99 MIL/uL (ref 3.87–5.11)
RDW: 13.5 % (ref 11.5–15.5)
WBC: 13.9 10*3/uL — ABNORMAL HIGH (ref 4.0–10.5)
nRBC: 0 % (ref 0.0–0.2)

## 2023-09-09 LAB — GLUCOSE, CAPILLARY
Glucose-Capillary: 107 mg/dL — ABNORMAL HIGH (ref 70–99)
Glucose-Capillary: 138 mg/dL — ABNORMAL HIGH (ref 70–99)
Glucose-Capillary: 295 mg/dL — ABNORMAL HIGH (ref 70–99)
Glucose-Capillary: 377 mg/dL — ABNORMAL HIGH (ref 70–99)

## 2023-09-09 LAB — PROTIME-INR
INR: 1.6 — ABNORMAL HIGH (ref 0.8–1.2)
Prothrombin Time: 19.5 s — ABNORMAL HIGH (ref 11.4–15.2)

## 2023-09-09 MED ORDER — METOPROLOL SUCCINATE ER 100 MG PO TB24
150.0000 mg | ORAL_TABLET | Freq: Every day | ORAL | Status: DC
  Administered 2023-09-10 – 2023-09-13 (×4): 150 mg via ORAL
  Filled 2023-09-09 (×4): qty 1

## 2023-09-09 MED ORDER — WARFARIN SODIUM 5 MG PO TABS
5.0000 mg | ORAL_TABLET | Freq: Once | ORAL | Status: AC
  Administered 2023-09-09: 5 mg via ORAL
  Filled 2023-09-09: qty 1

## 2023-09-09 MED ORDER — METOPROLOL TARTRATE 25 MG PO TABS
25.0000 mg | ORAL_TABLET | Freq: Once | ORAL | Status: AC
  Administered 2023-09-09: 25 mg via ORAL
  Filled 2023-09-09: qty 1

## 2023-09-09 MED ORDER — REVEFENACIN 175 MCG/3ML IN SOLN
175.0000 ug | Freq: Every day | RESPIRATORY_TRACT | Status: DC
  Administered 2023-09-10 – 2023-09-13 (×4): 175 ug via RESPIRATORY_TRACT
  Filled 2023-09-09 (×5): qty 3

## 2023-09-09 MED ORDER — BUDESONIDE 0.5 MG/2ML IN SUSP
0.5000 mg | Freq: Two times a day (BID) | RESPIRATORY_TRACT | Status: DC
  Administered 2023-09-09 – 2023-09-13 (×8): 0.5 mg via RESPIRATORY_TRACT
  Filled 2023-09-09 (×8): qty 2

## 2023-09-09 MED ORDER — ARFORMOTEROL TARTRATE 15 MCG/2ML IN NEBU
15.0000 ug | INHALATION_SOLUTION | Freq: Two times a day (BID) | RESPIRATORY_TRACT | Status: DC
  Administered 2023-09-09 – 2023-09-13 (×8): 15 ug via RESPIRATORY_TRACT
  Filled 2023-09-09 (×8): qty 2

## 2023-09-09 NOTE — Progress Notes (Signed)
Progress Note  Patient Name: Christy Poole Date of Encounter: 09/09/2023 Primary Cardiologist: Jodelle Red, MD   Subjective   Christy Poole, a 62 year old individual with a history of mitral stenosis and atrial fibrillation, previously rate-controlled, presents with complaints of chest pain and cough. She has a history of balloon valvuloplasty for mitral stenosis, which is now found to be significant again. She reports feeling cold but notes an improvement in her breathing.   Note: [employer] did not have an interpretor available.  Google Translate used.  Vital Signs    Vitals:   09/09/23 0500 09/09/23 0726 09/09/23 1055 09/09/23 1133  BP:  (!) 150/63 (!) 150/63 (!) 146/81  Pulse: 79 84 (!) 102 (!) 109  Resp: 14 15 20 20   Temp:  97.6 F (36.4 C)  97.6 F (36.4 C)  TempSrc:  Oral  Oral  SpO2: 95% 94%  93%  Weight: 48.2 kg     Height:        Intake/Output Summary (Last 24 hours) at 09/09/2023 1334 Last data filed at 09/09/2023 1038 Gross per 24 hour  Intake --  Output 800 ml  Net -800 ml   Filed Weights   09/07/23 0512 09/08/23 0623 09/09/23 0500  Weight: 49.6 kg 48.3 kg 48.2 kg    Physical Exam   Gen: no distress   Neck: Minimal JVD  Cardiac: No Rubs or Gallops, notable diastolic rumble IRIR tachycardia, +2 radial pulses Respiratory: Coarse breath sounds bilaterally GI: Soft, nontender, non-distended  MS: No  edema;  moves all extremities Integument: Skin feels warm Neuro:  At time of evaluation, alert and oriented to person/place/time/situation   Labs   Telemetry: AF rates ~ 110   Chemistry Recent Labs  Lab 09/05/23 0421 09/06/23 0623 09/07/23 0242 09/07/23 1615 09/07/23 1619 09/07/23 1621 09/08/23 1126  NA 135 136 133*   < > 141 140 136  K 3.8 4.4 4.8   < > 4.2 4.0 3.7  CL 100 102 99  --   --   --  98  CO2 23 26 27   --   --   --  27  GLUCOSE 249* 130* 144*  --   --   --  151*  BUN 19 47* 40*  --   --   --  35*  CREATININE 0.91 1.18*  0.76  --   --   --  1.06*  CALCIUM 9.8 9.8 9.5  --   --   --  10.0  PROT 7.8  --   --   --   --   --   --   ALBUMIN 3.5  --   --   --   --   --   --   AST 21  --   --   --   --   --   --   ALT 19  --   --   --   --   --   --   ALKPHOS 72  --   --   --   --   --   --   BILITOT 0.8  --   --   --   --   --   --   GFRNONAA >60 52* >60  --   --   --  59*  ANIONGAP 12 8 7   --   --   --  11   < > = values in this interval not displayed.     Hematology Recent  Labs  Lab 09/07/23 0857 09/07/23 1615 09/07/23 1621 09/08/23 0241 09/09/23 0218  WBC 15.8*  --   --  14.2* 13.9*  RBC 4.80  --   --  4.60 4.99  HGB 13.8   < > 13.3 13.1 13.9  HCT 42.0   < > 39.0 39.7 42.3  MCV 87.5  --   --  86.3 84.8  MCH 28.8  --   --  28.5 27.9  MCHC 32.9  --   --  33.0 32.9  RDW 13.5  --   --  13.6 13.5  PLT 215  --   --  230 242   < > = values in this interval not displayed.    Cardiac EnzymesNo results for input(s): "TROPONINI" in the last 168 hours. No results for input(s): "TROPIPOC" in the last 168 hours.   BNP Recent Labs  Lab 09/04/23 1330 09/08/23 1126  BNP 283.1* 129.1*     DDimer No results for input(s): "DDIMER" in the last 168 hours.   Cardiac Studies   Cardiac Studies & Procedures   CARDIAC CATHETERIZATION  CARDIAC CATHETERIZATION 09/07/2023  Narrative   Prox LAD to Mid LAD lesion is 5% stenosed.  1.  Minimal nonobstructive coronary artery disease with patent mid LAD stents. 2.  Fick cardiac output of 4.8 L/min and Fick cardiac index of 3.3 L/min/m with the following hemodynamics: Right atrial pressure mean of 15 mmHg Right ventricular pressure 46/21 with an end-diastolic pressure of 14 mmHg PA pressure 47/33 with a mean of 37 mmHg Wedge pressure mean of 23 mmHg PVR of 2.9 Woods units PA pulsatility index of 0.9 consistent with right ventricular dysfunction 3.  LVEDP of 22 mmHg  Recommendation: Continue evaluation and treatment of severe recurrent rheumatic mitral  stenosis per cardiology rounding team.  Findings Coronary Findings Diagnostic  Dominance: Right  Left Anterior Descending The vessel exhibits minimal luminal irregularities. Prox LAD to Mid LAD lesion is 5% stenosed. The lesion was previously treated .  Right Coronary Artery The vessel exhibits minimal luminal irregularities.  Intervention  No interventions have been documented.     ECHOCARDIOGRAM  ECHOCARDIOGRAM COMPLETE 09/04/2023  Narrative ECHOCARDIOGRAM REPORT    [name] Date of Exam: 09/04/2023 Medical Rec #:  [id number]       Height:       59.0 in Accession #:    [id number]      Weight:       110.0 lb Date of Birth:  Apr 19, 1961       BSA:          1.431 m Patient Age:    62 years        BP:           126/69 mmHg Patient Gender: F               HR:           118 bpm. Exam Location:  Inpatient  Procedure: 2D Echo, Color Doppler and Cardiac Doppler  STAT ECHO  Indications:     mitral stenosis  History:         Patient has prior history of Echocardiogram examinations, most recent 06/20/2022. History of stroke, Arrythmias:Atrial Fibrillation, Signs/Symptoms:Dyspnea; Risk Factors:Dyslipidemia and Diabetes.  Sonographer:     Delcie Roch RDCS Referring Phys:  [id number] Christy Poole Diagnosing Phys: Christy Poole   Sonographer Comments: Image acquisition challenging due to respiratory motion. IMPRESSIONS   1. Left ventricular ejection fraction,  by estimation, is 55 to 60%. The left ventricle has normal function. The left ventricle has no regional wall motion abnormalities. indeterminate due to atrial fibrillation. There is the interventricular septum is flattened in systole, consistent with right ventricular pressure overload. 2. Right ventricular systolic function is mildly reduced. The right ventricular size is normal. There is normal pulmonary artery systolic pressure. 3. Left atrial size was severely dilated. 4. Difficult  to assess given atrial fibrillation with rapid rate but appears MS is severe by mean gradient ( ) and by PHT (1.2-1.4cm^2). The mitral valve is rheumatic. Trivial mitral valve regurgitation. Severe mitral stenosis. The mean mitral valve gradient is 11.2 mmHg. 5. The aortic valve is tricuspid. Aortic valve regurgitation is not visualized. 6. The inferior vena cava is normal in size with <50% respiratory variability, suggesting right atrial pressure of 8 mmHg.  FINDINGS Left Ventricle: Left ventricular ejection fraction, by estimation, is 55 to 60%. The left ventricle has normal function. The left ventricle has no regional wall motion abnormalities. The left ventricular internal cavity size was small. There is no left ventricular hypertrophy. The interventricular septum is flattened in systole, consistent with right ventricular pressure overload. Indeterminate due to atrial fibrillation.  Right Ventricle: The right ventricular size is normal. No increase in right ventricular wall thickness. Right ventricular systolic function is mildly reduced. There is normal pulmonary artery systolic pressure. The tricuspid regurgitant velocity is 2.51 m/s, and with an assumed right atrial pressure of 3 mmHg, the estimated right ventricular systolic pressure is 28.2 mmHg.  Left Atrium: Left atrial size was severely dilated.  Right Atrium: Right atrial size was normal in size.  Pericardium: There is no evidence of pericardial effusion.  Mitral Valve: Difficult to assess given atrial fibrillation with rapid rate but appears MS is severe by mean gradient ( ) and by PHT (1.2-1.4cm^2). The mitral valve is rheumatic. There is mild thickening of the mitral valve leaflet(s). There is moderate calcification of the posterior mitral valve leaflet(s). Moderately decreased mobility of the mitral valve leaflets. Trivial mitral valve regurgitation. Severe mitral valve stenosis. MV peak gradient, 21.6 mmHg. The mean  mitral valve gradient is 11.2 mmHg.  Tricuspid Valve: The tricuspid valve is normal in structure. Tricuspid valve regurgitation is mild.  Aortic Valve: The aortic valve is tricuspid. Aortic valve regurgitation is not visualized.  Pulmonic Valve: The pulmonic valve was grossly normal. Pulmonic valve regurgitation is not visualized.  Aorta: The aortic root and ascending aorta are structurally normal, with no evidence of dilitation.  Venous: The inferior vena cava is normal in size with less than 50% respiratory variability, suggesting right atrial pressure of 8 mmHg.  IAS/Shunts: The interatrial septum was not well visualized.   LEFT VENTRICLE PLAX 2D LVIDd:         3.30 cm LVIDs:         2.30 cm LV PW:         0.80 cm LV IVS:        0.70 cm LVOT diam:     1.55 cm LV SV:         33 LV SV Index:   23 LVOT Area:     1.89 cm   RIGHT VENTRICLE          IVC RV Basal diam:  1.70 cm  IVC diam: 1.90 cm  LEFT ATRIUM             Index        RIGHT ATRIUM  Index LA diam:        3.50 cm 2.45 cm/m   RA Area:     11.80 cm LA Vol (A2C):   43.2 ml 30.20 ml/m  RA Volume:   23.20 ml  16.22 ml/m LA Vol (A4C):   38.6 ml 26.98 ml/m LA Biplane Vol: 41.9 ml 29.29 ml/m AORTIC VALVE LVOT Vmax:   101.23 cm/s LVOT Vmean:  68.533 cm/s LVOT VTI:    0.173 m  AORTA Ao Root diam: 2.40 cm Ao Asc diam:  2.80 cm  MITRAL VALVE              TRICUSPID VALVE MV Area VTI:  0.70 cm    TR Peak grad:   25.2 mmHg MV Peak grad: 21.6 mmHg   TR Vmax:        251.00 cm/s MV Mean grad: 11.2 mmHg MV Vmax:      2.32 m/s    SHUNTS MV Vmean:     157.3 cm/s  Systemic VTI:  0.17 m Systemic Diam: 1.55 cm  Christy Poole Electronically signed by Christy Poole Signature Date/Time: 09/04/2023/6:23:56 PM    Final (Updated)                  Assessment & Plan   Mitral Stenosis - Significant mitral stenosis with a history of balloon valvuloplasty. Symptoms include chest pain and cough.  Breathing status optimized with pulmonary involvement. Surgery anticipated post-infection recovery. Discussed surgical risks and benefits. - Pending surgery for mitral stenosis - 40 IV lasix today, potential transition to PO diuretic NA  Atrial Fibrillation (rheumatic) - AFib with previously rapid ventricular response, now rate controlled. Plan to adjust beta blocker for heart rate and blood pressure control. Discussed medication adjustments to maintain control. - Adjust beta blocker for heart rate and blood pressure control (150 mg PO Succinate at DC)  HTN - continue MRA  HLD - continue statin  Pulm is seeing now as well- potentially will DC on O2.  For questions or updates, please contact [facility] Please consult [email or web address] for contact info under Cardiology/STEMI.      Riley Lam, MD FASE [facility] Cardiologist [facility]  [address] [place], [address] [place], [place] [address] [phone]  1:34 PM

## 2023-09-09 NOTE — Progress Notes (Signed)
NAME:  Christy Poole, MRN:  [id number], DOB:  06/17/1961, LOS: 4 ADMISSION DATE:  09/04/2023, CONSULTATION DATE:  09/08/2023 REFERRING MD:  Dr Rennis Chris, CHIEF COMPLAINT: Bronchitis  History of Present Illness:  Asked to see patient for bronchitis  Was unable to question patient as video interpreter was not working-no interpreter available for the language that was chosen Gujarati, audio interpreter also keeps cutting out  History of hyperlipidemia, hypothyroidism, stroke, diabetes, atrial fibrillation, chronic bronchitis mitral stenosis who had presented with worsening cough, shortness of breath, chest pain with symptoms worsening over the last 2 months  Did see Dr. Everardo All recently in the office, diagnosed with chronic bronchitis, started on [facility] -CT scan of the chest obtained showing mediastinal adenopathy, lung nodules, mosaic attenuation-mosaic attenuation was similarly present on a CAT scan that was performed in December 2023 with no significant progression Spirometry significant for restrictive disease-still needs a full PFT to ascertain  S/p cardiac catheterization 11/14-evidence of pulmonary hypertension with mean pulmonary arterial pressure of 37, pulmonary vascular resistance of 2.9  Pertinent  Medical History   Past Medical History:  Diagnosis Date   Atrial fibrillation (HCC)    Diabetes mellitus without complication (HCC)    Stroke (HCC)    Thyroid disease     Significant Hospital Events: Including procedures, antibiotic start and stop dates in addition to other pertinent events   11/14 cardiac catheterization 11/15 pulmonary consult   Interim History / Subjective:   States that breathing is the same without any improvement.   Objective   Blood pressure (!) 146/81, pulse (!) 109, temperature 97.6 F (36.4 C), temperature source Oral, resp. rate 20, height 4\' 11"  (1.499 m), weight 48.2 kg, SpO2 93%.        Intake/Output Summary (Last 24 hours) at 09/09/2023  1258 Last data filed at 09/09/2023 1038 Gross per 24 hour  Intake --  Output 800 ml  Net -800 ml   Filed Weights   09/07/23 0512 09/08/23 0623 09/09/23 0500  Weight: 49.6 kg 48.3 kg 48.2 kg    Examination: Gen:      No acute distress HEENT:  EOMI, sclera anicteric Neck:     No masses; no thyromegaly Lungs:    Scattered rhonchi CV:         Regular rate and rhythm; no murmurs Abd:      + bowel sounds; soft, non-tender; no palpable masses, no distension Ext:    No edema; adequate peripheral perfusion Skin:      Warm and dry; no rash Neuro: alert and oriented x 3 Psych: normal mood and affect   CT chest on 06/16/2023 with mosaic attenuation, subcentimeter pulmonary nodule  Resolved Hospital Problem list     Assessment & Plan:  Shortness of breath -Likely multifactorial, may be related to pulmonary hypertension, underlying interstitial lung disease with abnormal findings on CT which may also be contributing to the coughing.  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  She will eventually need to have a procedure to help fix it.  Abnormal CT scan of the chest in the past showing mosaic appearance which may be in the context of airway disease, interstitial disease, pulmonary venoocclusive disease -Constrictive bronchiolitis, bronchiectasis, hypersensitivity pneumonitis considerations PFTs with possible restrictive physiology on spirometry.  She does not give any significant exposure history though we have language barriers due to difficulty with interpreter. Will order a high-res CT to evaluate the lung.  Is there  is evidence of interstitial lung disease then order CTD serologies Will need full PFTs as an outpatient. Recommend continuing antibiotics, prednisone Will stop the [facility] and try her on Brovana, Pulmicort and Yupelri nebs while she is inpatient.  Lung nodules Follow-up as  outpatient  Pulmonary will follow  Chilton Greathouse MD  Pulmonary & Critical care See Amion for pager  If no response to pager , please call [phone] until 7pm After 7:00 pm call Elink  [phone] 09/09/2023, 1:13 PM

## 2023-09-09 NOTE — Progress Notes (Signed)
Mobility Specialist Progress Note    09/09/23 1026  Mobility  Activity Ambulated with assistance in hallway  Level of Assistance Contact guard assist, steadying assist  Assistive Device Front wheel walker  Distance Ambulated (ft) 210 ft  Activity Response Tolerated well  Mobility Referral Yes  $Mobility charge 1 Mobility  Mobility Specialist Start Time (ACUTE ONLY) 1008  Mobility Specialist Stop Time (ACUTE ONLY) 1025  Mobility Specialist Time Calculation (min) (ACUTE ONLY) 17 min   Pre-Mobility: 82 HR, 95% SpO2 Post-Mobility: 91 HR, 91% SpO2  Pt received in bed and agreeable. No complaints on walk. Returned to [facility] with call bell in reach. NT and RN aware.    [place] Mobility Specialist  Please Neurosurgeon or Rehab Office at [phone]

## 2023-09-09 NOTE — Progress Notes (Signed)
PHARMACY - ANTICOAGULATION  Pharmacy Consult for warfarin/enoxaparin Indication: atrial fibrillation  No Known Allergies  Patient Measurements: Height: 4\' 11"  (149.9 cm) Weight: 48.2 kg (106 lb 4.2 oz) IBW/kg (Calculated) : 43.2  Vital Signs: Temp: 97.6 F (36.4 C) (11/16 1133) Temp Source: Oral (11/16 1133) BP: 146/81 (11/16 1133) Pulse Rate: 109 (11/16 1133)  Labs: Recent Labs    09/07/23 0242 09/07/23 0857 09/07/23 1120 09/07/23 1615 09/07/23 1621 09/08/23 0241 09/08/23 1126 09/09/23 0218  HGB 13.4 13.8  --    < > 13.3 13.1  --  13.9  HCT 41.4 42.0  --    < > 39.0 39.7  --  42.3  PLT 207 215  --   --   --  230  --  242  LABPROT 22.5*  --   --   --   --  16.2*  --  19.5*  INR 2.0*  --   --   --   --  1.3*  --  1.6*  HEPARINUNFRC  --   --  0.28*  --   --   --   --   --   CREATININE 0.76  --   --   --   --   --  1.06*  --    < > = values in this interval not displayed.    Estimated Creatinine Clearance: 36.6 mL/min (A) (by C-G formula based on SCr of 1.06 mg/dL (H)).   Assessment: 62 y.o. female with g/o Afib, warfarin on hold for cath and INR < 2.5. Was bridged with IV heparin prior to cath. Warfarin now resumed with enoxaparin bridge. INR today is subtherapeutic at 1.6, CBC stable.  Goal of Therapy:  INR 2.5 - 3.0 Monitor platelets by anticoagulation protocol: Yes   Plan:  Warfarin 5mg  PO x1 Enoxaparin 50mg  SQ BID Daily PT/INR  [place] Highland, PharmD, BCPS, [facility] Clinical Pharmacist [phone] Please check AMION for all [facility] Pharmacy numbers 09/09/2023

## 2023-09-09 NOTE — Plan of Care (Signed)
  Problem: Coping: Goal: Ability to adjust to condition or change in health will improve 09/09/2023 1847 by Duard Brady, RN Outcome: Progressing 09/09/2023 1847 by Duard Brady, RN Outcome: Progressing   Problem: Fluid Volume: Goal: Ability to maintain a balanced intake and output will improve 09/09/2023 1847 by Duard Brady, RN Outcome: Progressing 09/09/2023 1847 by Duard Brady, RN Outcome: Progressing

## 2023-09-09 NOTE — Plan of Care (Signed)

## 2023-09-09 NOTE — Progress Notes (Signed)
PROGRESS NOTE    Christy Poole  ZHY:[id number] DOB: 05-Feb-1961 DOA: 09/04/2023 PCP: de [place], Raymond J, MD  Chief Complaint  Patient presents with   Cough   Chest Pain   Shortness of Breath    Hospital Course:  Christy Poole is a 62 y.o. 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 Patient followed up with a pulmonologist and was diagnosed with chronic bronchitis, she was given Dulera at that time. CT imaging over this year also noted mediastinal lymphadenopathy currently being followed with repeat CT in 6 months. Patient also has known history of rheumatic mitral stenosis.  Last echo in August of this year showed moderate to severe mitral stenosis with mildly reduced RV function.  Does have history of balloon valvuloplasty.  On presentation to ED, mildly tachypneic and tachycardic, labs with leukocytosis at 12.8, PT and INR elevated at 30.6 and 2.9 respectively, on Coumadin. Troponin negative x 2. BNP elevated to 283. Respiratory panel for flu/COVID/RSV negative. Chest x-ray with increased interstitial markings favoring edema/CHF but unable to exclude superimposed pneumonitis.  EKG shows A-fib with RVR.  Patient received Solu-Medrol, magnesium, continuous albuterol in the ED. Cardiology was also consulted.  Echocardiogram was ordered. 11/12: Vital stable, remained on room air.  Labs with INR 2.8,Procalcitonin negative,  worsening leukocytosis at 13.4 but patient also received steroid,  blood glucose elevated above 200,  UA with mild image urea and rare bacteria. RVP negative.  Echocardiogram with normal EF, flattening of interventricular septum during systole consistent with right ventricular overload, mildly reduced RV function, severely  dilated left atrium and severe mitral stenosis. Cardiology thinks that her symptoms are mostly pulmonary and should be addressed first before taking care of mitral stenosis.  Started on Z-Pak and Tussionex.  Patient remained in A-fib-starting on Cardizem infusion as recommended by cardiology. 11/13: INR rose to 4.2 likely from Keppra and Z-Pak.  Pharmacy consulted.  Held coumadin and given dose of vit K. Afib RVR again in the evening, responsive to IVP Metop 11/14: INR down to 2.0 this AM.  O2 sats in the 92-95.  Patient feels more comfortable 2 L O2.  No episodes of RVR overnight.  Cath reveals Nonobstructive coronary artery disease with patent mid LAD stents. 11/15: Have initiated Lovenox bridge as now INR is less than 2.  Worsening sputum production and hypoxia.  Repeat chest x-ray without any evidence of pulmonary edema or worsening infection.  Sputum culture sent.  Pulmonology consulted  Subjective: Patient continues to complain of coughing and pain with coughing.  She reports that current medications do not appear to be helping  Objective: Vitals:   09/09/23 0500 09/09/23 0726 09/09/23 1055 09/09/23 1133  BP:  (!) 150/63 (!) 150/63 (!) 146/81  Pulse: 79 84 (!) 102 (!) 109  Resp: 14 15 20 20   Temp:  97.6 F (36.4 C)  97.6 F (36.4 C)  TempSrc:  Oral  Oral  SpO2: 95% 94%  93%  Weight: 48.2 kg     Height:        Intake/Output Summary (Last 24 hours) at 09/09/2023 1454 Last data filed at 09/09/2023 1038 Gross per 24 hour  Intake --  Output 800 ml  Net -800 ml    Filed Weights   09/07/23 0512 09/08/23 0623 09/09/23 0500  Weight: 49.6 kg 48.3 kg  48.2 kg    Examination:  General exam: Appears calm and comfortable  Respiratory system: Coughing, some upper airway rales, green sputum appreciated  Cardiovascular system: irregularly irregular, rate 70s, +murmur  Gastrointestinal system: Abdomen is nondistended, soft and nontender. No organomegaly or masses felt. Normal bowel  sounds heard. Central nervous system: Alert and oriented. No focal neurological deficits. Extremities: Symmetric 5 x 5 power. Skin: No rashes, lesions or ulcers Psychiatry: Judgement and insight appear normal. Mood & affect appropriate.   Assessment & Plan:   Principal Problem:   Respiratory distress Active Problems:   Permanent atrial fibrillation (HCC)   Rheumatic mitral stenosis   Diabetes mellitus (HCC)   Hx-TIA (transient ischemic attack)   Dyslipidemia   Hypothyroidism   Cough   Dyspnea on exertion   Precordial chest pain   Renal insufficiency   Supratherapeutic INR   AKI (acute kidney injury) (HCC)     Assessment and Plan: * Respiratory distress Chronic cough/chronic pneumonitis/chronic bronchitis. Initially presented with dyspnea.  Multifactorial.  Some elements of orthopnea with intermittent chest pain which does appear mostly related to cough. Pulmonology outpatient has been working patient up with current diagnosis of pneumonitis versus chronic bronchitis. - Respiratory viral panel, COVID, flu, RSV negative. - Increasing O2 requirement 11/15 -Sent sputum for culture.  No significant yet - Rpt CXR 11/15 unremarkable -Consulted pulmonology whom patient sees outpatient.  PFTs with possible restrictive physiology on spirometry.  Chest CT ordered and pending.  Concern for ILD.  Will need to follow-up outpatient for full PFTs.  Appreciate further pulm recommendations. - Pro-Cal negative - Echo reveals preserved EF however given severe MS and intermittently in A-fib with RVR there may be component of pulmonary edema though not volume overloaded on CXR.  She is on daily diuretics, which were increased 11/15 - Continue with prednisone and nebs.  Currently tapering steroids - Has been on azithromycin, end date 11/17.  Plan to extend antibiotics per sputum culture results if needed -On Dulera at home, pulmonology to discontinue and try Brovana, Pulmicort, and Yupelri  nebs -Continue Robitussin   Permanent atrial fibrillation with RVR (HCC) Initially resolved with Cardizem drip. Required IVP Metop on 11/13. No further episodes. EF preserved on echo Now on metop increasing today Cardiology following  On warfarin anticoagulation Therapeutic on arrival then supratherapeutic 11/13 which was likely secondary to newly added azithromycin. 11/13 received vitamin K.   Pharmacy has been consulted.  They will continue to manage warfarin.  Azithromycin last dose tomorrow.  This will likely change warfarin needs 11/15 INR has fallen below 2, now on Lovenox bridge.   Rheumatic mitral stenosis S/p valvuloplasty in the past.  Echocardiogram with worsening stenosis, severely dilated left atrium and concern of some pulmonary vascular congestion. - Cont lasix -Cardiology is on board - Will need to return for MVR at later time after this acute admission   Diabetes mellitus (HCC) -Continue with SSI, hold long acting for now as bedside RN reports minimal intake and BG consistently <150  History of stroke -Continue home aspirin and Coumadin as above   Dyslipidemia -Continue home atorvastatin   Hypothyroidism -Continue home Synthroid  Complex partial seizures -Follows with neurology outpatient -Continue home dose Keppra  Leukocytosis Acute worsening 2/2 to steroid use. Peaked at [facility] on 11/13, downtrending now without further intervention Patient remains afebrile, procalcitonin unremarkable.   Will continue to monitor.   Continue azithromycin for bronchitis treatment as above.   Patient is a [place] speaking and translator was used for  the entirety of our interaction.  DVT prophylaxis: Coumadin Code Status: Full Disposition: Status is: Inpatient, pending heart cath today    Consultants:  Cardiology, Dr. Odis Hollingshead Pulmonology, Dr. Isaiah Serge  Procedures:  Cath 11/14  Antimicrobials:  Anti-infectives (From admission, onward)    Start     Dose/Rate Route  Frequency Ordered Stop   09/06/23 1000  azithromycin (ZITHROMAX) tablet 250 mg       Placed in "Followed by" Linked Group   250 mg Oral Daily 09/05/23 0758 09/09/23 1055   09/05/23 1000  azithromycin (ZITHROMAX) tablet 500 mg       Placed in "Followed by" Linked Group   500 mg Oral Daily 09/05/23 0758 09/05/23 0812       Data Reviewed: I have personally reviewed following labs and imaging studies  CBC: Recent Labs  Lab 09/06/23 0623 09/07/23 0242 09/07/23 0857 09/07/23 1615 09/07/23 1619 09/07/23 1621 09/08/23 0241 09/09/23 0218  WBC 21.1* 15.8* 15.8*  --   --   --  14.2* 13.9*  HGB 13.4 13.4 13.8 13.9 13.6 13.3 13.1 13.9  HCT 41.7 41.4 42.0 41.0 40.0 39.0 39.7 42.3  MCV 87.1 86.1 87.5  --   --   --  86.3 84.8  PLT 215 207 215  --   --   --  230 242    Basic Metabolic Panel: Recent Labs  Lab 09/04/23 1144 09/04/23 1335 09/05/23 0421 09/06/23 0623 09/07/23 0242 09/07/23 1615 09/07/23 1619 09/07/23 1621 09/08/23 1126  NA 138  --  135 136 133* 139 141 140 136  K 4.0  --  3.8 4.4 4.8 4.3 4.2 4.0 3.7  CL 104  --  100 102 99  --   --   --  98  CO2 23  --  23 26 27   --   --   --  27  GLUCOSE 140*  --  249* 130* 144*  --   --   --  151*  BUN 21  --  19 47* 40*  --   --   --  35*  CREATININE 0.80  --  0.91 1.18* 0.76  --   --   --  1.06*  CALCIUM 9.7  --  9.8 9.8 9.5  --   --   --  10.0  MG  --  2.0  --   --   --   --   --   --   --     GFR: Estimated Creatinine Clearance: 36.6 mL/min (A) (by C-G formula based on SCr of 1.06 mg/dL (H)).  Liver Function Tests: Recent Labs  Lab 09/05/23 0421  AST 21  ALT 19  ALKPHOS 72  BILITOT 0.8  PROT 7.8  ALBUMIN 3.5    CBG: Recent Labs  Lab 09/08/23 1135 09/08/23 1631 09/08/23 2135 09/09/23 0617 09/09/23 1132  GLUCAP 161* 322* 253* 107* 138*     Recent Results (from the past 240 hour(s))  Resp panel by RT-PCR (RSV, Flu A&B, Covid) Anterior Nasal Swab     Status: None   Collection Time: 09/04/23  1:20 PM    Specimen: Anterior Nasal Swab  Result Value Ref Range Status   SARS Coronavirus 2 by RT PCR NEGATIVE NEGATIVE Final   Influenza A by PCR NEGATIVE NEGATIVE Final   Influenza B by PCR NEGATIVE NEGATIVE Final    Comment: (NOTE) The Xpert Xpress SARS-CoV-2/FLU/RSV plus assay is intended as an aid in the diagnosis of influenza from Nasopharyngeal swab specimens  and should not be used as a sole basis for treatment. Nasal washings and aspirates are unacceptable for Xpert Xpress SARS-CoV-2/FLU/RSV testing.  Fact Sheet for Patients: BloggerCourse.com  Fact Sheet for Healthcare Providers: SeriousBroker.it  This test is not yet approved or cleared by the [place] FDA and has been authorized for detection and/or diagnosis of SARS-CoV-2 by FDA under an Emergency Use Authorization (EUA). This EUA will remain in effect (meaning this test can be used) for the duration of the COVID-19 declaration under Section 564(b)(1) of the Act, 21 U.S.C. section 360bbb-3(b)(1), unless the authorization is terminated or revoked.     Resp Syncytial Virus by PCR NEGATIVE NEGATIVE Final    Comment: (NOTE) Fact Sheet for Patients: BloggerCourse.com  Fact Sheet for Healthcare Providers: SeriousBroker.it  This test is not yet approved or cleared by the [place] FDA and has been authorized for detection and/or diagnosis of SARS-CoV-2 by FDA under an Emergency Use Authorization (EUA). This EUA will remain in effect (meaning this test can be used) for the duration of the COVID-19 declaration under Section 564(b)(1) of the Act, 21 U.S.C. section 360bbb-3(b)(1), unless the authorization is terminated or revoked.  Performed at [facility], [address] [place], [place] [address]   Respiratory (~20 pathogens) panel by PCR     Status: None   Collection Time: 09/04/23  5:04 PM   Specimen:  Nasopharyngeal Swab; Respiratory  Result Value Ref Range Status   Adenovirus NOT DETECTED NOT DETECTED Final   Coronavirus 229E NOT DETECTED NOT DETECTED Final    Comment: (NOTE) The Coronavirus on the Respiratory Panel, DOES NOT test for the novel  Coronavirus (2019 nCoV)    Coronavirus HKU1 NOT DETECTED NOT DETECTED Final   Coronavirus NL63 NOT DETECTED NOT DETECTED Final   Coronavirus OC43 NOT DETECTED NOT DETECTED Final   Metapneumovirus NOT DETECTED NOT DETECTED Final   Rhinovirus / Enterovirus NOT DETECTED NOT DETECTED Final   Influenza A NOT DETECTED NOT DETECTED Final   Influenza B NOT DETECTED NOT DETECTED Final   Parainfluenza Virus 1 NOT DETECTED NOT DETECTED Final   Parainfluenza Virus 2 NOT DETECTED NOT DETECTED Final   Parainfluenza Virus 3 NOT DETECTED NOT DETECTED Final   Parainfluenza Virus 4 NOT DETECTED NOT DETECTED Final   Respiratory Syncytial Virus NOT DETECTED NOT DETECTED Final   Bordetella pertussis NOT DETECTED NOT DETECTED Final   Bordetella Parapertussis NOT DETECTED NOT DETECTED Final   Chlamydophila pneumoniae NOT DETECTED NOT DETECTED Final   Mycoplasma pneumoniae NOT DETECTED NOT DETECTED Final    Comment: Performed at [facility], [address] [place], [place] [address]  MRSA Next Gen by PCR, Nasal     Status: None   Collection Time: 09/05/23  2:22 PM   Specimen: Nasal Mucosa; Nasal Swab  Result Value Ref Range Status   MRSA by PCR Next Gen NOT DETECTED NOT DETECTED Final    Comment: (NOTE) The GeneXpert MRSA Assay (FDA approved for NASAL specimens only), is one component of a comprehensive MRSA colonization surveillance program. It is not intended to diagnose MRSA infection nor to guide or monitor treatment for MRSA infections. Test performance is not FDA approved in patients less than 52 years old. Performed at [facility], [address] [place], [place] [address]   Expectorated Sputum Assessment w Gram Stain, Rflx to Resp  Cult     Status: None   Collection Time: 09/08/23 12:54 PM   Specimen: Expectorated Sputum  Result Value Ref Range Status   Specimen Description EXPECTORATED SPUTUM  Final   Special Requests NONE  Final   Sputum evaluation   Final    THIS SPECIMEN IS ACCEPTABLE FOR SPUTUM CULTURE Performed at [facility], [address] [place], [place] [address]    Report Status 09/08/2023 FINAL  Final  Culture, Respiratory w Gram Stain     Status: None (Preliminary result)   Collection Time: 09/08/23 12:54 PM  Result Value Ref Range Status   Specimen Description EXPECTORATED SPUTUM  Final   Special Requests NONE Reflexed from [id number]  Final   Gram Stain   Final    MODERATE WBC PRESENT, PREDOMINANTLY PMN RARE GRAM POSITIVE COCCI IN PAIRS RARE GRAM POSITIVE RODS    Culture   Final    CULTURE REINCUBATED FOR BETTER GROWTH Performed at [facility], [address] [place], [place] [address]    Report Status PENDING  Incomplete         Radiology Studies: DG Chest 1 View  Result Date: 09/08/2023 CLINICAL DATA:  Dyspnea. EXAM: CHEST  1 VIEW COMPARISON:  September 04, 2023. FINDINGS: Mild cardiomegaly is noted. No consolidative process is noted. Bony thorax is unremarkable. IMPRESSION: No active disease. Electronically Signed   By: Lupita Raider M.D.   On: 09/08/2023 09:52   CARDIAC CATHETERIZATION  Result Date: 09/07/2023   Prox LAD to Mid LAD lesion is 5% stenosed. 1.  Minimal nonobstructive coronary artery disease with patent mid LAD stents. 2.  Fick cardiac output of 4.8 L/min and Fick cardiac index of 3.3 L/min/m with the following hemodynamics:  Right atrial pressure mean of 15 mmHg  Right ventricular pressure 46/21 with an end-diastolic pressure of 14 mmHg  PA pressure 47/33 with a mean of 37 mmHg  Wedge pressure mean of 23 mmHg  PVR of 2.9 Woods units  PA pulsatility index of 0.9 consistent with right ventricular dysfunction 3.  LVEDP of 22 mmHg Recommendation: Continue  evaluation and treatment of severe recurrent rheumatic mitral stenosis per cardiology rounding team.        Scheduled Meds:  arformoterol  15 mcg Nebulization BID   atorvastatin  80 mg Oral Daily   budesonide (PULMICORT) nebulizer solution  0.5 mg Nebulization BID   chlorpheniramine-HYDROcodone  5 mL Oral Q12H   enoxaparin (LOVENOX) injection  50 mg Subcutaneous BID   famotidine  20 mg Oral BID   furosemide  40 mg Intravenous Daily   gabapentin  100 mg Oral QHS   insulin aspart  0-15 Units Subcutaneous TID WC   insulin aspart  0-5 Units Subcutaneous QHS   levETIRAcetam  250 mg Oral BID   levothyroxine  50 mcg Oral Q0600   [START ON 09/10/2023] metoprolol succinate  150 mg Oral Daily   metoprolol tartrate  25 mg Oral Once   predniSONE  40 mg Oral Q breakfast   revefenacin  175 mcg Nebulization Daily   sodium chloride flush  3 mL Intravenous Q12H   sodium chloride flush  3 mL Intravenous Q12H   spironolactone  12.5 mg Oral Daily   Warfarin - Pharmacist Dosing Inpatient   Does not apply q1600   Continuous Infusions:     LOS: 4 days    Time spent:     Debarah Crape, DO [facility]   To contact the attending provider between 7A-7P or the covering provider during after hours 7P-7A, please log into the web site [email or web address] and access using  universal [place] password for that web site. If you do not have the password, please call the hospital operator.  09/09/2023, 2:54 PM

## 2023-09-10 DIAGNOSIS — R0603 Acute respiratory distress: Secondary | ICD-10-CM | POA: Diagnosis not present

## 2023-09-10 LAB — GLUCOSE, CAPILLARY
Glucose-Capillary: 111 mg/dL — ABNORMAL HIGH (ref 70–99)
Glucose-Capillary: 97 mg/dL (ref 70–99)
Glucose-Capillary: 313 mg/dL — ABNORMAL HIGH (ref 70–99)
Glucose-Capillary: 302 mg/dL — ABNORMAL HIGH (ref 70–99)

## 2023-09-10 LAB — PROTIME-INR
INR: 2 — ABNORMAL HIGH (ref 0.8–1.2)
Prothrombin Time: 23.2 s — ABNORMAL HIGH (ref 11.4–15.2)

## 2023-09-10 LAB — CBC
HCT: 44.2 % (ref 36.0–46.0)
Hemoglobin: 14.5 g/dL (ref 12.0–15.0)
MCH: 27.7 pg (ref 26.0–34.0)
MCHC: 32.8 g/dL (ref 30.0–36.0)
MCV: 84.5 fL (ref 80.0–100.0)
Platelets: 279 10*3/uL (ref 150–400)
RBC: 5.23 MIL/uL — ABNORMAL HIGH (ref 3.87–5.11)
RDW: 13.1 % (ref 11.5–15.5)
WBC: 13.8 10*3/uL — ABNORMAL HIGH (ref 4.0–10.5)
nRBC: 0 % (ref 0.0–0.2)

## 2023-09-10 MED ORDER — SODIUM CHLORIDE 0.9 % IV SOLN
2.0000 g | Freq: Every day | INTRAVENOUS | Status: DC
  Administered 2023-09-10 – 2023-09-12 (×3): 2 g via INTRAVENOUS
  Filled 2023-09-10 (×3): qty 20

## 2023-09-10 MED ORDER — WARFARIN SODIUM 5 MG PO TABS
5.0000 mg | ORAL_TABLET | Freq: Once | ORAL | Status: AC
  Administered 2023-09-10: 5 mg via ORAL
  Filled 2023-09-10: qty 1

## 2023-09-10 NOTE — Progress Notes (Addendum)
PROGRESS NOTE    Christy Poole  BMW:[id number] DOB: 03-Jan-1961 DOA: 09/04/2023 PCP: de [place], Raymond J, MD  Chief Complaint  Patient presents with   Cough   Chest Pain   Shortness of Breath    Hospital Course:  Christy Poole is a 62 y.o. 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 Patient followed up with a pulmonologist and was diagnosed with chronic bronchitis, she was given Dulera at that time. CT imaging over this year also noted mediastinal lymphadenopathy currently being followed with repeat CT in 6 months. Patient also has known history of rheumatic mitral stenosis.  Last echo in August of this year showed moderate to severe mitral stenosis with mildly reduced RV function.  Does have history of balloon valvuloplasty.  On presentation to ED, mildly tachypneic and tachycardic, labs with leukocytosis at 12.8, PT and INR elevated at 30.6 and 2.9 respectively, on Coumadin. Troponin negative x 2. BNP elevated to 283. Respiratory panel for flu/COVID/RSV negative. Chest x-ray with increased interstitial markings favoring edema/CHF but unable to exclude superimposed pneumonitis.  EKG shows A-fib with RVR.  Patient received Solu-Medrol, magnesium, continuous albuterol in the ED. Cardiology was also consulted.  Echocardiogram was ordered. 11/12: Vital stable, remained on room air.  Labs with INR 2.8,Procalcitonin negative,  worsening leukocytosis at 13.4 but patient also received steroid,  blood glucose elevated above 200,  UA with mild image urea and rare bacteria. RVP negative.  Echocardiogram with normal EF, flattening of interventricular septum during systole consistent with right ventricular overload, mildly reduced RV function, severely  dilated left atrium and severe mitral stenosis. Cardiology thinks that her symptoms are mostly pulmonary and should be addressed first before taking care of mitral stenosis.  Started on Z-Pak and Tussionex.  Patient remained in A-fib-starting on Cardizem infusion as recommended by cardiology. 11/13: INR rose to 4.2 likely from Keppra and Z-Pak.  Pharmacy consulted.  Held coumadin and given dose of vit K. Afib RVR again in the evening, responsive to IVP Metop 11/14: INR down to 2.0 this AM.  O2 sats in the 92-95.  Patient feels more comfortable 2 L O2.  No episodes of RVR overnight.  Cath reveals Nonobstructive coronary artery disease with patent mid LAD stents. 11/15: Have initiated Lovenox bridge as now INR is less than 2.  Worsening sputum production and hypoxia.  Repeat chest x-ray without any evidence of pulmonary edema or worsening infection.  Sputum culture sent.  Pulmonology consulted 11/16: Chest CT with bronchopneumonia  Subjective: Continues to have coughing this morning.  Bedside RN reports patient had significant coughing when taking medications.  Concern for pill dysphagia.    Unfortunately, no Mining engineer available today.  Used Designer, fashion/clothing and patient endorsed understanding care plan  Objective: Vitals:   09/10/23 0425 09/10/23 0600 09/10/23 0729 09/10/23 0922  BP: 124/84  126/77 126/77  Pulse: 75 64 97 86  Resp:  12 19   Temp: 97.6 F (36.4 C)  97.7 F (36.5 C)   TempSrc: Oral  Oral   SpO2: 93% 95% 94%   Weight: 48 kg     Height:        Intake/Output Summary (Last 24 hours) at 09/10/2023 1217 Last data filed at 09/10/2023 1007 Gross per 24 hour  Intake 717 ml  Output 950 ml  Net -233  ml    Filed Weights   09/08/23 0623 09/09/23 0500 09/10/23 0425  Weight: 48.3 kg 48.2 kg 48 kg    Examination:  General exam: Appears calm and comfortable, weak appearing Respiratory system: Coughing, some upper airway rales Cardiovascular system: irregularly  irregular, rate 70s, +murmur  Gastrointestinal system: Abdomen is nondistended, soft and nontender.  Central nervous system: Alert and oriented. No focal neurological deficits. Extremities: Symmetric Skin: No rashes, lesions or ulcers Psychiatry: Judgement and insight appear normal. Mood & affect appropriate.   Assessment & Plan:   Principal Problem:   Respiratory distress Active Problems:   Permanent atrial fibrillation (HCC)   Rheumatic mitral stenosis   Diabetes mellitus (HCC)   Hx-TIA (transient ischemic attack)   Dyslipidemia   Hypothyroidism   Cough   Dyspnea on exertion   Precordial chest pain   Renal insufficiency   Supratherapeutic INR   AKI (acute kidney injury) (HCC)     Assessment and Plan: * Respiratory distress Chronic cough/chronic pneumonitis/chronic bronchitis. Initially presented with dyspnea.  Multifactorial.  Some elements of orthopnea with intermittent chest pain which does appear mostly related to cough. Pulmonology outpatient has been working patient up with current diagnosis of pneumonitis versus chronic bronchitis. - Respiratory viral panel, COVID, flu, RSV negative. - Increasing O2 requirement 11/15. On room air at home -Sent sputum for culture.  No significant findings as of yet. Follow.  - Rpt CXR 11/15 unremarkable - Chest CT 11/16 with bronchopneumonia.  Last dose azithromycin (originally for bronchitis) scheduled for today.  On evaluation of pending sputum culture currently just growing rare gram-positive cocci in pairs.  Will reinitiate ceftriaxone 2 g daily.   -Consulted pulmonology whom patient sees outpatient.  Appreciate further recommendations -Reportedly PFTs with possible restrictive physiology on spirometry. Will need to follow-up outpatient for full PFTs.  - Echo reveals preserved EF however given severe MS and intermittently in A-fib with RVR there may be component of pulmonary edema though not volume overloaded on CXR.  She is on  daily diuretics, which were increased 11/15 - Continue with prednisone and nebs.  Avoid further tapering steroids for now given worsening resp status  -On Dulera at home, pulmonology to discontinue and try Brovana, Pulmicort, and Yupelri nebs -Continue Robitussin   Permanent atrial fibrillation with RVR (HCC) Initially resolved with Cardizem drip.  Now on metop, cont to titrate EF preserved on echo Cardiology following  On warfarin anticoagulation Therapeutic on arrival then supratherapeutic 11/13 which was likely secondary to newly added azithromycin.  11/13 received vitamin K.   Pharmacy has been consulted.  They will continue to manage warfarin.  Azithromycin last dose today.  This will likely change warfarin needs 11/15 INR has fallen below 2, now on Lovenox bridge.   Rheumatic mitral stenosis S/p valvuloplasty in the past.  Echocardiogram with worsening stenosis, severely dilated left atrium and concern of some pulmonary vascular congestion. - Cont lasix per cards. IV currently, hopefully transition to PO tomorrow -Cardiology planning to have pt return for MVR at later time after this acute admission   Diabetes mellitus (HCC) -Continue with SSI, hold long acting for now as bedside RN reports minimal intake and BG consistently <150  History of stroke -Continue home aspirin and Coumadin as above   Dyslipidemia -Continue home atorvastatin   Hypothyroidism -Continue home Synthroid  Complex partial seizures -Follows with neurology outpatient -Continue home dose Keppra  Leukocytosis Acute worsening 2/2 to steroid use. Peaked at [facility] on 11/13, downtrending now without further intervention Patient  remains afebrile, procalcitonin unremarkable.   Will continue to monitor.   Continue azithromycin/ceft for pna treatment as above.  Coughing with medication -Complicated by pna cough. RN concerned for pill dysphasia -SLP consulted to eval   Patient is a [place] speaking and  translator was used for the entirety of our interaction.  DVT prophylaxis: Coumadin Code Status: Full Disposition: Status is: Inpatient, pending heart cath today    Consultants:  Cardiology, Dr. Odis Hollingshead Pulmonology, Dr. Isaiah Serge  Procedures:  Cath 11/14  Antimicrobials:  Anti-infectives (From admission, onward)    Start     Dose/Rate Route Frequency Ordered Stop   09/10/23 0830  cefTRIAXone (ROCEPHIN) 2 g in sodium chloride 0.9 % 100 mL IVPB        2 g 200 mL/hr over 30 Minutes Intravenous Daily 09/10/23 0743     09/06/23 1000  azithromycin (ZITHROMAX) tablet 250 mg       Placed in "Followed by" Linked Group   250 mg Oral Daily 09/05/23 0758 09/09/23 1055   09/05/23 1000  azithromycin (ZITHROMAX) tablet 500 mg       Placed in "Followed by" Linked Group   500 mg Oral Daily 09/05/23 0758 09/05/23 0812       Data Reviewed: I have personally reviewed following labs and imaging studies  CBC: Recent Labs  Lab 09/07/23 0242 09/07/23 0857 09/07/23 1615 09/07/23 1619 09/07/23 1621 09/08/23 0241 09/09/23 0218 09/10/23 0226  WBC 15.8* 15.8*  --   --   --  14.2* 13.9* 13.8*  HGB 13.4 13.8   < > 13.6 13.3 13.1 13.9 14.5  HCT 41.4 42.0   < > 40.0 39.0 39.7 42.3 44.2  MCV 86.1 87.5  --   --   --  86.3 84.8 84.5  PLT 207 215  --   --   --  230 242 279   < > = values in this interval not displayed.    Basic Metabolic Panel: Recent Labs  Lab 09/04/23 1144 09/04/23 1335 09/05/23 0421 09/06/23 0623 09/07/23 0242 09/07/23 1615 09/07/23 1619 09/07/23 1621 09/08/23 1126  NA 138  --  135 136 133* 139 141 140 136  K 4.0  --  3.8 4.4 4.8 4.3 4.2 4.0 3.7  CL 104  --  100 102 99  --   --   --  98  CO2 23  --  23 26 27   --   --   --  27  GLUCOSE 140*  --  249* 130* 144*  --   --   --  151*  BUN 21  --  19 47* 40*  --   --   --  35*  CREATININE 0.80  --  0.91 1.18* 0.76  --   --   --  1.06*  CALCIUM 9.7  --  9.8 9.8 9.5  --   --   --  10.0  MG  --  2.0  --   --   --   --   --    --   --     GFR: Estimated Creatinine Clearance: 36.6 mL/min (A) (by C-G formula based on SCr of 1.06 mg/dL (H)).  Liver Function Tests: Recent Labs  Lab 09/05/23 0421  AST 21  ALT 19  ALKPHOS 72  BILITOT 0.8  PROT 7.8  ALBUMIN 3.5    CBG: Recent Labs  Lab 09/09/23 1132 09/09/23 1651 09/09/23 2101 09/10/23 0536 09/10/23 1139  GLUCAP 138* 295* 377* 111* 97  Recent Results (from the past 240 hour(s))  Resp panel by RT-PCR (RSV, Flu A&B, Covid) Anterior Nasal Swab     Status: None   Collection Time: 09/04/23  1:20 PM   Specimen: Anterior Nasal Swab  Result Value Ref Range Status   SARS Coronavirus 2 by RT PCR NEGATIVE NEGATIVE Final   Influenza A by PCR NEGATIVE NEGATIVE Final   Influenza B by PCR NEGATIVE NEGATIVE Final    Comment: (NOTE) The Xpert Xpress SARS-CoV-2/FLU/RSV plus assay is intended as an aid in the diagnosis of influenza from Nasopharyngeal swab specimens and should not be used as a sole basis for treatment. Nasal washings and aspirates are unacceptable for Xpert Xpress SARS-CoV-2/FLU/RSV testing.  Fact Sheet for Patients: BloggerCourse.com  Fact Sheet for Healthcare Providers: SeriousBroker.it  This test is not yet approved or cleared by the [place] FDA and has been authorized for detection and/or diagnosis of SARS-CoV-2 by FDA under an Emergency Use Authorization (EUA). This EUA will remain in effect (meaning this test can be used) for the duration of the COVID-19 declaration under Section 564(b)(1) of the Act, 21 U.S.C. section 360bbb-3(b)(1), unless the authorization is terminated or revoked.     Resp Syncytial Virus by PCR NEGATIVE NEGATIVE Final    Comment: (NOTE) Fact Sheet for Patients: BloggerCourse.com  Fact Sheet for Healthcare Providers: SeriousBroker.it  This test is not yet approved or cleared by the [place] FDA  and has been authorized for detection and/or diagnosis of SARS-CoV-2 by FDA under an Emergency Use Authorization (EUA). This EUA will remain in effect (meaning this test can be used) for the duration of the COVID-19 declaration under Section 564(b)(1) of the Act, 21 U.S.C. section 360bbb-3(b)(1), unless the authorization is terminated or revoked.  Performed at [facility], [address], [place], [place] [address]   Respiratory (~20 pathogens) panel by PCR     Status: None   Collection Time: 09/04/23  5:04 PM   Specimen: Nasopharyngeal Swab; Respiratory  Result Value Ref Range Status   Adenovirus NOT DETECTED NOT DETECTED Final   Coronavirus 229E NOT DETECTED NOT DETECTED Final    Comment: (NOTE) The Coronavirus on the Respiratory Panel, DOES NOT test for the novel  Coronavirus (2019 nCoV)    Coronavirus HKU1 NOT DETECTED NOT DETECTED Final   Coronavirus NL63 NOT DETECTED NOT DETECTED Final   Coronavirus OC43 NOT DETECTED NOT DETECTED Final   Metapneumovirus NOT DETECTED NOT DETECTED Final   Rhinovirus / Enterovirus NOT DETECTED NOT DETECTED Final   Influenza A NOT DETECTED NOT DETECTED Final   Influenza B NOT DETECTED NOT DETECTED Final   Parainfluenza Virus 1 NOT DETECTED NOT DETECTED Final   Parainfluenza Virus 2 NOT DETECTED NOT DETECTED Final   Parainfluenza Virus 3 NOT DETECTED NOT DETECTED Final   Parainfluenza Virus 4 NOT DETECTED NOT DETECTED Final   Respiratory Syncytial Virus NOT DETECTED NOT DETECTED Final   Bordetella pertussis NOT DETECTED NOT DETECTED Final   Bordetella Parapertussis NOT DETECTED NOT DETECTED Final   Chlamydophila pneumoniae NOT DETECTED NOT DETECTED Final   Mycoplasma pneumoniae NOT DETECTED NOT DETECTED Final    Comment: Performed at [facility], [address], [place], [place] [address]  MRSA Next Gen by PCR, Nasal     Status: None   Collection Time: 09/05/23  2:22 PM   Specimen: Nasal Mucosa; Nasal Swab  Result Value Ref  Range Status   MRSA by PCR Next Gen NOT DETECTED NOT DETECTED Final  Comment: (NOTE) The GeneXpert MRSA Assay (FDA approved for NASAL specimens only), is one component of a comprehensive MRSA colonization surveillance program. It is not intended to diagnose MRSA infection nor to guide or monitor treatment for MRSA infections. Test performance is not FDA approved in patients less than 29 years old. Performed at [facility], [address], [place], [place] [address]   Expectorated Sputum Assessment w Gram Stain, Rflx to Resp Cult     Status: None   Collection Time: 09/08/23 12:54 PM   Specimen: Expectorated Sputum  Result Value Ref Range Status   Specimen Description EXPECTORATED SPUTUM  Final   Special Requests NONE  Final   Sputum evaluation   Final    THIS SPECIMEN IS ACCEPTABLE FOR SPUTUM CULTURE Performed at [facility], [address] [place], [place] [address]    Report Status 09/08/2023 FINAL  Final  Culture, Respiratory w Gram Stain     Status: None (Preliminary result)   Collection Time: 09/08/23 12:54 PM  Result Value Ref Range Status   Specimen Description EXPECTORATED SPUTUM  Final   Special Requests NONE Reflexed from [id number]  Final   Gram Stain   Final    MODERATE WBC PRESENT, PREDOMINANTLY PMN RARE GRAM POSITIVE COCCI IN PAIRS RARE GRAM POSITIVE RODS    Culture   Final    CULTURE REINCUBATED FOR BETTER GROWTH Performed at [facility], [address], [place], [place] [address]    Report Status PENDING  Incomplete         Radiology Studies: CT Chest High Resolution  Result Date: 09/10/2023 CLINICAL DATA:  62 year old female with history of weakness and confusion. Suspected interstitial lung disease. EXAM: CT CHEST WITHOUT CONTRAST TECHNIQUE: Multidetector CT imaging of the chest was performed following the standard protocol without intravenous contrast. High resolution imaging of the lungs, as well as inspiratory and expiratory  imaging, was performed. RADIATION DOSE REDUCTION: This exam was performed according to the departmental dose-optimization program which includes automated exposure control, adjustment of the mA and/or kV according to patient size and/or use of iterative reconstruction technique. COMPARISON:  Chest CT 06/16/2023. FINDINGS: Cardiovascular: Heart size is enlarged with biatrial dilatation. There is no significant pericardial fluid, thickening or pericardial calcification. There is aortic atherosclerosis, as well as atherosclerosis of the great vessels of the mediastinum and the coronary arteries, including calcified atherosclerotic plaque in the left main, left anterior descending, left circumflex and right coronary arteries. Calcifications of the mitral annulus. Mediastinum/Nodes: Prominent AP window lymph node, similar to the prior examination, measuring 1 cm in short axis (previously not hypermetabolic on prior PET-CT 12/09/2022), presumably benign. No other pathologically enlarged mediastinal or hilar lymph nodes. Esophagus is unremarkable in appearance. No axillary lymphadenopathy. Lungs/Pleura: High-resolution imaging is limited by considerable patient respiratory motion. With these limitations in mind, there is some patchy ground-glass attenuation interspersed with areas of lucency, likely reflecting areas of air trapping from small airways disease. This is similar to prior studies. In addition, today's study demonstrates extensive thickening of the peribronchovascular interstitium with regional areas of peribronchovascular consolidation, ground-glass attenuation and micro nodularity scattered throughout the lungs bilaterally, overall appearance of which favors bronchopneumonia. Consolidation is most confluent in the inferior segment of the lingula and in the lower lobes of the lungs bilaterally. No generalized areas of septal thickening, traction bronchiectasis or honeycombing are noted. Upper Abdomen:  Unremarkable. Musculoskeletal: There are no aggressive appearing lytic or blastic lesions noted in the visualized portions of  the skeleton. IMPRESSION: 1. No compelling findings to suggest interstitial lung disease. The overall appearance of the lungs suggests multilobar bilateral bronchopneumonia. 2. There is also evidence of extensive air trapping from small airways disease. 3. Cardiomegaly with biatrial dilatation. 4. Aortic atherosclerosis, in addition to left main and three-vessel coronary artery disease. Please note that although the presence of coronary artery calcium documents the presence of coronary artery disease, the severity of this disease and any potential stenosis cannot be assessed on this non-gated CT examination. Assessment for potential risk factor modification, dietary therapy or pharmacologic therapy may be warranted, if clinically indicated. 5. There are calcifications of the mitral annulus. Echocardiographic correlation for evaluation of potential valvular dysfunction may be warranted if clinically indicated. Aortic Atherosclerosis (ICD10-I70.0). Electronically Signed   By: Trudie Reed M.D.   On: 09/10/2023 06:39        Scheduled Meds:  arformoterol  15 mcg Nebulization BID   atorvastatin  80 mg Oral Daily   budesonide (PULMICORT) nebulizer solution  0.5 mg Nebulization BID   chlorpheniramine-HYDROcodone  5 mL Oral Q12H   enoxaparin (LOVENOX) injection  50 mg Subcutaneous BID   famotidine  20 mg Oral BID   furosemide  40 mg Intravenous Daily   gabapentin  100 mg Oral QHS   insulin aspart  0-15 Units Subcutaneous TID WC   insulin aspart  0-5 Units Subcutaneous QHS   levETIRAcetam  250 mg Oral BID   levothyroxine  50 mcg Oral Q0600   metoprolol succinate  150 mg Oral Daily   predniSONE  40 mg Oral Q breakfast   revefenacin  175 mcg Nebulization Daily   sodium chloride flush  3 mL Intravenous Q12H   spironolactone  12.5 mg Oral Daily   Warfarin - Pharmacist Dosing  Inpatient   Does not apply q1600   Continuous Infusions:  cefTRIAXone (ROCEPHIN)  IV 2 g (09/10/23 0930)      LOS: 5 days    Time spent:     Debarah Crape, DO [facility]   To contact the attending provider between 7A-7P or the covering provider during after hours 7P-7A, please log into the web site [email or web address] and access using universal [place] password for that web site. If you do not have the password, please call the hospital operator.  09/10/2023, 12:17 PM

## 2023-09-10 NOTE — Progress Notes (Signed)
Son Christy Poole came and visited patient. RN updated him with plan of care and he is requesting help setting up Cap[employer] so he can help take care of patient at home. Also asking for an update from MD.

## 2023-09-10 NOTE — Progress Notes (Addendum)
Progress Note  Patient Name: Lahari ANACHRISTINA MECKSTROTH Date of Encounter: 09/10/2023 Primary Cardiologist: Jodelle Red, MD   Subjective   Since interval, heart rates has improved. She notes that she has had worsening cough this AM Still feels chills.  Staff noted potential pill dysphagia.  Vital Signs    Vitals:   09/10/23 0425 09/10/23 0600 09/10/23 0729 09/10/23 0922  BP: 124/84  126/77 126/77  Pulse: 75 64 97 86  Resp:  12 19   Temp: 97.6 F (36.4 C)  97.7 F (36.5 C)   TempSrc: Oral  Oral   SpO2: 93% 95% 94%   Weight: 48 kg     Height:        Intake/Output Summary (Last 24 hours) at 09/10/2023 0932 Last data filed at 09/10/2023 0734 Gross per 24 hour  Intake 1117 ml  Output 850 ml  Net 267 ml   Filed Weights   09/08/23 0623 09/09/23 0500 09/10/23 0425  Weight: 48.3 kg 48.2 kg 48 kg    Physical Exam   Gen: Mild distress   Neck: Minimal JVD  Cardiac: No Rubs or Gallops, notable diastolic rumble IRIR rhythm, +2 radial pulses Respiratory: Coarse breath sounds bilaterally with worsening rhonchi GI: Soft, nontender, non-distended  MS: No edema;  moves all extremities Integument: Skin feels warm Neuro:  At time of evaluation, alert and oriented to person/place/time/situation   Labs   Telemetry: AF rate controlled   Chemistry Recent Labs  Lab 09/05/23 0421 09/06/23 0623 09/07/23 0242 09/07/23 1615 09/07/23 1619 09/07/23 1621 09/08/23 1126  NA 135 136 133*   < > 141 140 136  K 3.8 4.4 4.8   < > 4.2 4.0 3.7  CL 100 102 99  --   --   --  98  CO2 23 26 27   --   --   --  27  GLUCOSE 249* 130* 144*  --   --   --  151*  BUN 19 47* 40*  --   --   --  35*  CREATININE 0.91 1.18* 0.76  --   --   --  1.06*  CALCIUM 9.8 9.8 9.5  --   --   --  10.0  PROT 7.8  --   --   --   --   --   --   ALBUMIN 3.5  --   --   --   --   --   --   AST 21  --   --   --   --   --   --   ALT 19  --   --   --   --   --   --   ALKPHOS 72  --   --   --   --   --   --    BILITOT 0.8  --   --   --   --   --   --   GFRNONAA >60 52* >60  --   --   --  59*  ANIONGAP 12 8 7   --   --   --  11   < > = values in this interval not displayed.     Hematology Recent Labs  Lab 09/08/23 0241 09/09/23 0218 09/10/23 0226  WBC 14.2* 13.9* 13.8*  RBC 4.60 4.99 5.23*  HGB 13.1 13.9 14.5  HCT 39.7 42.3 44.2  MCV 86.3 84.8 84.5  MCH 28.5 27.9 27.7  MCHC 33.0 32.9 32.8  RDW 13.6 13.5 13.1  PLT 230 242 279    Cardiac EnzymesNo results for input(s): "TROPONINI" in the last 168 hours. No results for input(s): "TROPIPOC" in the last 168 hours.   BNP Recent Labs  Lab 09/04/23 1330 09/08/23 1126  BNP 283.1* 129.1*     DDimer No results for input(s): "DDIMER" in the last 168 hours.   Cardiac Studies   Cardiac Studies & Procedures   CARDIAC CATHETERIZATION  CARDIAC CATHETERIZATION 09/07/2023  Narrative   Prox LAD to Mid LAD lesion is 5% stenosed.  1.  Minimal nonobstructive coronary artery disease with patent mid LAD stents. 2.  Fick cardiac output of 4.8 L/min and Fick cardiac index of 3.3 L/min/m with the following hemodynamics: Right atrial pressure mean of 15 mmHg Right ventricular pressure 46/21 with an end-diastolic pressure of 14 mmHg PA pressure 47/33 with a mean of 37 mmHg Wedge pressure mean of 23 mmHg PVR of 2.9 Woods units PA pulsatility index of 0.9 consistent with right ventricular dysfunction 3.  LVEDP of 22 mmHg  Recommendation: Continue evaluation and treatment of severe recurrent rheumatic mitral stenosis per cardiology rounding team.  Findings Coronary Findings Diagnostic  Dominance: Right  Left Anterior Descending The vessel exhibits minimal luminal irregularities. Prox LAD to Mid LAD lesion is 5% stenosed. The lesion was previously treated .  Right Coronary Artery The vessel exhibits minimal luminal irregularities.  Intervention  No interventions have been documented.     ECHOCARDIOGRAM  ECHOCARDIOGRAM COMPLETE  09/04/2023  Narrative ECHOCARDIOGRAM REPORT    Patient Name:   MAARI VALENCIA Date of Exam: 09/04/2023 Medical Rec #:  [id number]       Height:       59.0 in Accession #:    [id number]      Weight:       110.0 lb Date of Birth:  1961-03-26       BSA:          1.431 m Patient Age:    62 years        BP:           126/69 mmHg Patient Gender: F               HR:           118 bpm. Exam Location:  Inpatient  Procedure: 2D Echo, Color Doppler and Cardiac Doppler  STAT ECHO  Indications:     mitral stenosis  History:         Patient has prior history of Echocardiogram examinations, most recent 06/20/2022. History of stroke, Arrythmias:Atrial Fibrillation, Signs/Symptoms:Dyspnea; Risk Factors:Dyslipidemia and Diabetes.  Sonographer:     Delcie Roch RDCS Referring Phys:  [id number] Cecille Po MELVIN Diagnosing Phys: Clearnce Hasten   Sonographer Comments: Image acquisition challenging due to respiratory motion. IMPRESSIONS   1. Left ventricular ejection fraction, by estimation, is 55 to 60%. The left ventricle has normal function. The left ventricle has no regional wall motion abnormalities. indeterminate due to atrial fibrillation. There is the interventricular septum is flattened in systole, consistent with right ventricular pressure overload. 2. Right ventricular systolic function is mildly reduced. The right ventricular size is normal. There is normal pulmonary artery systolic pressure. 3. Left atrial size was severely dilated. 4. Difficult to assess given atrial fibrillation with rapid rate but appears MS is severe by mean gradient ( ) and by PHT (1.2-1.4cm^2). The mitral valve is rheumatic. Trivial mitral valve regurgitation. Severe mitral stenosis. The mean mitral valve gradient is 11.2 mmHg.  5. The aortic valve is tricuspid. Aortic valve regurgitation is not visualized. 6. The inferior vena cava is normal in size with <50% respiratory variability, suggesting right  atrial pressure of 8 mmHg.  FINDINGS Left Ventricle: Left ventricular ejection fraction, by estimation, is 55 to 60%. The left ventricle has normal function. The left ventricle has no regional wall motion abnormalities. The left ventricular internal cavity size was small. There is no left ventricular hypertrophy. The interventricular septum is flattened in systole, consistent with right ventricular pressure overload. Indeterminate due to atrial fibrillation.  Right Ventricle: The right ventricular size is normal. No increase in right ventricular wall thickness. Right ventricular systolic function is mildly reduced. There is normal pulmonary artery systolic pressure. The tricuspid regurgitant velocity is 2.51 m/s, and with an assumed right atrial pressure of 3 mmHg, the estimated right ventricular systolic pressure is 28.2 mmHg.  Left Atrium: Left atrial size was severely dilated.  Right Atrium: Right atrial size was normal in size.  Pericardium: There is no evidence of pericardial effusion.  Mitral Valve: Difficult to assess given atrial fibrillation with rapid rate but appears MS is severe by mean gradient ( ) and by PHT (1.2-1.4cm^2). The mitral valve is rheumatic. There is mild thickening of the mitral valve leaflet(s). There is moderate calcification of the posterior mitral valve leaflet(s). Moderately decreased mobility of the mitral valve leaflets. Trivial mitral valve regurgitation. Severe mitral valve stenosis. MV peak gradient, 21.6 mmHg. The mean mitral valve gradient is 11.2 mmHg.  Tricuspid Valve: The tricuspid valve is normal in structure. Tricuspid valve regurgitation is mild.  Aortic Valve: The aortic valve is tricuspid. Aortic valve regurgitation is not visualized.  Pulmonic Valve: The pulmonic valve was grossly normal. Pulmonic valve regurgitation is not visualized.  Aorta: The aortic root and ascending aorta are structurally normal, with no evidence of  dilitation.  Venous: The inferior vena cava is normal in size with less than 50% respiratory variability, suggesting right atrial pressure of 8 mmHg.  IAS/Shunts: The interatrial septum was not well visualized.   LEFT VENTRICLE PLAX 2D LVIDd:         3.30 cm LVIDs:         2.30 cm LV PW:         0.80 cm LV IVS:        0.70 cm LVOT diam:     1.55 cm LV SV:         33 LV SV Index:   23 LVOT Area:     1.89 cm   RIGHT VENTRICLE          IVC RV Basal diam:  1.70 cm  IVC diam: 1.90 cm  LEFT ATRIUM             Index        RIGHT ATRIUM           Index LA diam:        3.50 cm 2.45 cm/m   RA Area:     11.80 cm LA Vol (A2C):   43.2 ml 30.20 ml/m  RA Volume:   23.20 ml  16.22 ml/m LA Vol (A4C):   38.6 ml 26.98 ml/m LA Biplane Vol: 41.9 ml 29.29 ml/m AORTIC VALVE LVOT Vmax:   101.23 cm/s LVOT Vmean:  68.533 cm/s LVOT VTI:    0.173 m  AORTA Ao Root diam: 2.40 cm Ao Asc diam:  2.80 cm  MITRAL VALVE  TRICUSPID VALVE MV Area VTI:  0.70 cm    TR Peak grad:   25.2 mmHg MV Peak grad: 21.6 mmHg   TR Vmax:        251.00 cm/s MV Mean grad: 11.2 mmHg MV Vmax:      2.32 m/s    SHUNTS MV Vmean:     157.3 cm/s  Systemic VTI:  0.17 m Systemic Diam: 1.55 cm  Clearnce Hasten Electronically signed by Clearnce Hasten Signature Date/Time: 09/04/2023/6:23:56 PM    Final (Updated)                  Assessment & Plan   Mitral Stenosis - Significant mitral stenosis with a history of balloon valvuloplasty, pending surgical evaluation. - 40 IV lasix today, BMP tomorrow, BB as below  Atrial Fibrillation (rheumatic) - CHADSVASC NA on coumadin with lovenox bridge but now at INR of 2.0 - On 150 mg PO Succinate presently  Acute on chronic hypoxic respiratory failure - SLP evaluation today which is reasonable; I suspect this is multi-factorial; related to bronchitis, with potentially chemical aspiration, with residual volume from her mitral stenosis; hope to transition to  PO lasix tomorrow pending BNP  HTN - continue MRA  HLD - continue statin  Provider Note: [facility] is often unable to staff [place], 11/18 would be ideal for inpatient translator  For questions or updates, please contact [facility] Please consult [email or web address] for contact info under Cardiology/STEMI.      Riley Lam, MD FASE [facility] Cardiologist [facility]  [address] [place], [address] [place], [place] [address] [phone]  9:32 AM

## 2023-09-10 NOTE — Evaluation (Signed)
Clinical/Bedside Swallow Evaluation Patient Details  Name: Christy Poole MRN: [id number] Date of Birth: [birth year]  Today's Date: 09/10/2023 Time: SLP Start Time (ACUTE ONLY): 1455 SLP Stop Time (ACUTE ONLY): 1505 SLP Time Calculation (min) (ACUTE ONLY): 10 min  Past Medical History:  Past Medical History:  Diagnosis Date   Atrial fibrillation (HCC)    Diabetes mellitus without complication (HCC)    Stroke (HCC)    Thyroid disease    Past Surgical History:  Past Surgical History:  Procedure Laterality Date   RIGHT/LEFT HEART CATH AND CORONARY ANGIOGRAPHY N/A 09/07/2023   Procedure: RIGHT/LEFT HEART CATH AND CORONARY ANGIOGRAPHY;  Surgeon: Orbie Pyo, MD;  Location: [facility] INVASIVE CV LAB;  Service: Cardiovascular;  Laterality: N/A;   HPI:  Patient is a 62 y.o. 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 Chest x-ray with increased interstitial markings favoring edema/CHF but unable to exclude superimposed pneumonitis. SLP swallow evalution ordered on 09/10/23 due to patient observed to have increased coughing when taking medications and after PO's with her breakfast tray.    Assessment / Plan / Recommendation  Clinical Impression  Patient is currently presenting with questionable pharyngeal phase dysphagia as per this bedside swallow evaluation. Evaluation was limited by patient's lethargy as well as lack of adequate [place] interpreter. (during attempt to use teleinterpreter, no [place] interpreter available.) SLP utilized google translate with patient able to read information but as typing back to SLP was not an option, her responses were limited. In addition, she would close here eyes and seem to fall asleep unless SLP was directly interacting with her. SLP assessed patient's swallow with a couple  sips of thin liquids (water) and although patient did exhibit a cough response, she was observed to cough prior to PO intake as well. SLP spoke with patient's RN after evaluation and she stated that patient was observed to have some coughing when taking her medications but with solid foods she seemed to tolerate without difficulty. SLP recommending to continue current PO diet and will reassess next visit with plan for securing either a live or teleinterpreter to accomodate her [place] language. SLP Visit Diagnosis: Dysphagia, unspecified (R13.10)    Aspiration Risk  Mild aspiration risk    Diet Recommendation Other (Comment) (continue with current diet, will more fully assess next visit)    Liquid Administration via: Cup;Straw Medication Administration: Whole meds with puree Supervision: Patient able to self feed Compensations: Slow rate;Small sips/bites Postural Changes: Seated upright at 90 degrees    Other  Recommendations Oral Care Recommendations: Oral care BID    Recommendations for follow up therapy are one component of a multi-disciplinary discharge planning process, led by the attending physician.  Recommendations may be updated based on patient status, additional functional criteria and insurance authorization.  Follow up Recommendations Other (comment) (TBD)      Assistance Recommended at Discharge    Functional Status Assessment Patient has had a recent decline in their functional status and demonstrates the ability to make significant improvements in function in a reasonable and predictable amount of time.  Frequency and Duration min 1 x/week  1 week       Prognosis Prognosis for improved oropharyngeal function: Good      Swallow Study   General Date of Onset: 09/10/23 HPI: Patient is a 62 y.o. 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 Chest x-ray with increased interstitial markings favoring edema/CHF but unable to exclude superimposed pneumonitis. SLP swallow evalution ordered on 09/10/23 due to patient observed to have increased coughing when taking medications and after PO's with her breakfast tray. Type of Study: Bedside Swallow Evaluation Previous Swallow Assessment: none found Diet Prior to this Study: Regular;Thin liquids (Level 0) Temperature Spikes Noted: No Respiratory Status: Nasal cannula History of Recent Intubation: No Behavior/Cognition: Lethargic/Drowsy Oral Cavity Assessment: Within Functional Limits Oral Care Completed by SLP: No Oral Cavity - Dentition: Adequate natural dentition Vision: Functional for self-feeding Self-Feeding Abilities: Able to feed self Patient Positioning: Upright in bed Baseline Vocal Quality: Low vocal intensity Volitional Cough: Congested Volitional Swallow: Unable to elicit    Oral/Motor/Sensory Function Overall Oral Motor/Sensory Function: Within functional limits   Ice Chips     Thin Liquid Thin Liquid: Impaired Presentation: Straw Pharyngeal  Phase Impairments: Cough - Delayed    Nectar Thick     Honey Thick     Puree Puree: Not tested   Solid     Solid: Not tested      Angela Nevin, MA, CCC-SLP Speech Therapy

## 2023-09-10 NOTE — Progress Notes (Signed)
Mobility Specialist Progress Note    09/10/23 1025  Mobility  Activity Ambulated with assistance in hallway  Level of Assistance Contact guard assist, steadying assist  Assistive Device Front wheel walker  Distance Ambulated (ft) 210 ft  Activity Response Tolerated well  Mobility Referral Yes  $Mobility charge 1 Mobility  Mobility Specialist Start Time (ACUTE ONLY) 1012  Mobility Specialist Stop Time (ACUTE ONLY) 1025  Mobility Specialist Time Calculation (min) (ACUTE ONLY) 13 min   Pre-Mobility: 83 HR, 95% SpO2 Post-Mobility: 93 HR, 96% SpO2  Pt received in bed and agreeable. No complaints on walk. On 2LO2. Returned to chair with call bell in reach.   Edna Nation Mobility Specialist  Please Neurosurgeon or Rehab Office at [phone]

## 2023-09-10 NOTE — Progress Notes (Signed)
Patient took meds and had a drink of water and began coughing hard after. After a minute she calmed down and stopped coughing. VSS. Lung sounds were diminished on R side and left side rhonchi. MD notified and slp consulted.

## 2023-09-10 NOTE — Progress Notes (Signed)
NAME:  Christy Poole, MRN:  [id number], DOB:  Dec 09, 1960, LOS: 5 ADMISSION DATE:  09/04/2023, CONSULTATION DATE:  09/08/2023 REFERRING MD:  Dr Rennis Chris, CHIEF COMPLAINT: Bronchitis  History of Present Illness:  Asked to see patient for bronchitis  History of hyperlipidemia, hypothyroidism, stroke, diabetes, atrial fibrillation, chronic bronchitis mitral stenosis who had presented with worsening cough, shortness of breath, chest pain with symptoms worsening over the last 2 months  Did see Dr. Everardo All recently in the office, diagnosed with chronic bronchitis, started on Dulera -CT scan of the chest obtained showing mediastinal adenopathy, lung nodules, mosaic attenuation-mosaic attenuation was similarly present on a CAT scan that was performed in December 2023 with no significant progression Spirometry significant for restrictive disease-still needs a full PFT to ascertain  S/p cardiac catheterization 11/14-evidence of pulmonary hypertension with mean pulmonary arterial pressure of 37, pulmonary vascular resistance of 2.9  Pertinent  Medical History   Past Medical History:  Diagnosis Date   Atrial fibrillation (HCC)    Diabetes mellitus without complication (HCC)    Stroke (HCC)    Thyroid disease     Significant Hospital Events: Including procedures, antibiotic start and stop dates in addition to other pertinent events   11/14 cardiac catheterization 11/15 pulmonary consult   Interim History / Subjective:   Continues to have cough.  States that dyspnea is slightly better.   Objective   Blood pressure 126/77, pulse 86, temperature 97.7 F (36.5 C), temperature source Oral, resp. rate 19, height 4\' 11"  (1.499 m), weight 48 kg, SpO2 94%.        Intake/Output Summary (Last 24 hours) at 09/10/2023 1217 Last data filed at 09/10/2023 1007 Gross per 24 hour  Intake 717 ml  Output 950 ml  Net -233 ml   Filed Weights   09/08/23 0623 09/09/23 0500 09/10/23 0425  Weight: 48.3 kg  48.2 kg 48 kg    Examination: Gen:      No acute distress HEENT:  EOMI, sclera anicteric Neck:     No masses; no thyromegaly Lungs:    Clear to auscultation bilaterally; normal respiratory effort CV:         Regular rate and rhythm; no murmurs Abd:      + bowel sounds; soft, non-tender; no palpable masses, no distension Ext:    No edema; adequate peripheral perfusion Skin:      Warm and dry; no rash Neuro: alert and oriented x 3 Psych: normal mood and affect   Lab/imaging reviewed Significant for BUN/creatinine 35/1.06 WBC 13.8  High-res CT reviewed with extensive air trapping, groundglass opacities at the base, cardiomegaly with biatrial dilatation  Resolved Hospital Problem list     Assessment & Plan:  Shortness of breath -Likely multifactorial, may be related to acute bronchitis, bronchopneumonia with baseline pulmonary hypertension, cardiac disease Pulmonary hypertension is likely secondary to progressive severe rheumatic mitral valve stenosis Cardiology is on board.  She will eventually need to have a procedure to help fix it. Abnormal CT scan of the chest in the past showing mosaic appearance which may be in the context of pulmonary vascular disease or airway disease though she does not have any obstruction on PFTs. There is no evidence of interstitial lung disease  Continue bronchodilators, prednisone Finished azithromycin.  Continue ceftriaxone for community-acquired pneumonia coverage She is getting a speech eval to evaluate for aspiration  Lung nodules Follow-up as outpatient  Chilton Greathouse MD [facility] See Amion for pager  If no response to  pager , please call [phone] until 7pm After 7:00 pm call Elink  [phone] 09/10/2023, 12:17 PM

## 2023-09-10 NOTE — Progress Notes (Signed)
PHARMACY - ANTICOAGULATION  Pharmacy Consult for warfarin/enoxaparin Indication: atrial fibrillation  No Known Allergies  Patient Measurements: Height: 4\' 11"  (149.9 cm) Weight: 48 kg (105 lb 13.1 oz) IBW/kg (Calculated) : 43.2  Vital Signs: Temp: 97.8 F (36.6 C) (11/17 1327) Temp Source: Oral (11/17 1327) BP: 105/79 (11/17 1327) Pulse Rate: 83 (11/17 1327)  Labs: Recent Labs    09/08/23 0241 09/08/23 1126 09/09/23 0218 09/10/23 0226  HGB 13.1  --  13.9 14.5  HCT 39.7  --  42.3 44.2  PLT 230  --  242 279  LABPROT 16.2*  --  19.5* 23.2*  INR 1.3*  --  1.6* 2.0*  CREATININE  --  1.06*  --   --     Estimated Creatinine Clearance: 36.6 mL/min (A) (by C-G formula based on SCr of 1.06 mg/dL (H)).   Assessment: 62 y.o. female with g/o Afib, warfarin on hold for cath and INR < 2.5. Was bridged with IV heparin prior to cath. Warfarin now resumed with enoxaparin bridge. Home warfarin dose 3.75mg  daily except 5mg  Mon/Fri.  INR is therapeutic today at 2.0, below patient-specific goal but within therapeutic range. Discussed with MD and will go ahead and stop enoxaparin dosing.  Goal of Therapy:  INR 2.5 - 3.0 Monitor platelets by anticoagulation protocol: Yes   Plan:  Warfarin 5mg  PO x1 again today Daily INR Stop enoxaparin  Fredonia Highland, PharmD, BCPS, [facility] Clinical Pharmacist [phone] Please check AMION for all [facility] Pharmacy numbers 09/10/2023

## 2023-09-11 DIAGNOSIS — I050 Rheumatic mitral stenosis: Secondary | ICD-10-CM | POA: Diagnosis not present

## 2023-09-11 DIAGNOSIS — I4821 Permanent atrial fibrillation: Secondary | ICD-10-CM | POA: Diagnosis not present

## 2023-09-11 DIAGNOSIS — J180 Bronchopneumonia, unspecified organism: Secondary | ICD-10-CM | POA: Diagnosis not present

## 2023-09-11 DIAGNOSIS — R0603 Acute respiratory distress: Secondary | ICD-10-CM | POA: Diagnosis not present

## 2023-09-11 DIAGNOSIS — J9601 Acute respiratory failure with hypoxia: Secondary | ICD-10-CM

## 2023-09-11 DIAGNOSIS — N179 Acute kidney failure, unspecified: Secondary | ICD-10-CM | POA: Diagnosis not present

## 2023-09-11 LAB — GLUCOSE, CAPILLARY
Glucose-Capillary: 105 mg/dL — ABNORMAL HIGH (ref 70–99)
Glucose-Capillary: 105 mg/dL — ABNORMAL HIGH (ref 70–99)
Glucose-Capillary: 294 mg/dL — ABNORMAL HIGH (ref 70–99)
Glucose-Capillary: 148 mg/dL — ABNORMAL HIGH (ref 70–99)

## 2023-09-11 LAB — CBC
HCT: 41.4 % (ref 36.0–46.0)
Hemoglobin: 13.5 g/dL (ref 12.0–15.0)
MCH: 27.5 pg (ref 26.0–34.0)
MCHC: 32.6 g/dL (ref 30.0–36.0)
MCV: 84.3 fL (ref 80.0–100.0)
Platelets: 253 10*3/uL (ref 150–400)
RBC: 4.91 MIL/uL (ref 3.87–5.11)
RDW: 13.1 % (ref 11.5–15.5)
WBC: 13.9 10*3/uL — ABNORMAL HIGH (ref 4.0–10.5)
nRBC: 0 % (ref 0.0–0.2)

## 2023-09-11 LAB — BASIC METABOLIC PANEL
Anion gap: 6 (ref 5–15)
BUN: 24 mg/dL — ABNORMAL HIGH (ref 8–23)
CO2: 26 mmol/L (ref 22–32)
Calcium: 9.3 mg/dL (ref 8.9–10.3)
Chloride: 100 mmol/L (ref 98–111)
Creatinine, Ser: 0.99 mg/dL (ref 0.44–1.00)
GFR, Estimated: >60 mL/min (ref >60)
Glucose, Bld: 185 mg/dL — ABNORMAL HIGH (ref 70–99)
Potassium: 3.9 mmol/L (ref 3.5–5.1)
Sodium: 132 mmol/L — ABNORMAL LOW (ref 135–145)

## 2023-09-11 LAB — LIPOPROTEIN A (LPA): Lipoprotein (a): 12.5 nmol/L (ref <75.0)

## 2023-09-11 LAB — PROTIME-INR
INR: 3 — ABNORMAL HIGH (ref 0.8–1.2)
Prothrombin Time: 31.1 s — ABNORMAL HIGH (ref 11.4–15.2)

## 2023-09-11 LAB — CULTURE, RESPIRATORY W GRAM STAIN: Culture: NORMAL

## 2023-09-11 LAB — BRAIN NATRIURETIC PEPTIDE: B Natriuretic Peptide: 152.7 pg/mL — ABNORMAL HIGH (ref 0.0–100.0)

## 2023-09-11 MED ORDER — INSULIN ASPART 100 UNIT/ML IJ SOLN
4.0000 [IU] | Freq: Three times a day (TID) | INTRAMUSCULAR | Status: DC
  Administered 2023-09-11 – 2023-09-12 (×4): 4 [IU] via SUBCUTANEOUS

## 2023-09-11 MED ORDER — WARFARIN 0.5 MG HALF TABLET
0.5000 mg | ORAL_TABLET | Freq: Once | ORAL | Status: AC
  Administered 2023-09-11: 0.5 mg via ORAL
  Filled 2023-09-11: qty 1

## 2023-09-11 MED ORDER — INSULIN ASPART 100 UNIT/ML IJ SOLN
0.0000 [IU] | Freq: Three times a day (TID) | INTRAMUSCULAR | Status: DC
  Administered 2023-09-11: 8 [IU] via SUBCUTANEOUS
  Administered 2023-09-12 (×2): 3 [IU] via SUBCUTANEOUS

## 2023-09-11 MED ORDER — WARFARIN SODIUM 1 MG PO TABS
1.0000 mg | ORAL_TABLET | Freq: Once | ORAL | Status: DC
  Filled 2023-09-11: qty 1

## 2023-09-11 MED ORDER — FUROSEMIDE 40 MG PO TABS
40.0000 mg | ORAL_TABLET | Freq: Every day | ORAL | Status: DC
  Administered 2023-09-12 – 2023-09-13 (×2): 40 mg via ORAL
  Filled 2023-09-11 (×2): qty 1

## 2023-09-11 MED ORDER — PREDNISONE 20 MG PO TABS
20.0000 mg | ORAL_TABLET | Freq: Every day | ORAL | Status: DC
  Administered 2023-09-11: 20 mg via ORAL
  Filled 2023-09-11: qty 1

## 2023-09-11 MED ORDER — INSULIN ASPART 100 UNIT/ML IJ SOLN: 0.0000 [IU] | Freq: Every day | INTRAMUSCULAR | Status: DC

## 2023-09-11 MED ORDER — INSULIN DETEMIR 100 UNIT/ML ~~LOC~~ SOLN
5.0000 [IU] | Freq: Two times a day (BID) | SUBCUTANEOUS | Status: DC
  Administered 2023-09-11 (×2): 5 [IU] via SUBCUTANEOUS
  Filled 2023-09-11 (×4): qty 0.05

## 2023-09-11 NOTE — Progress Notes (Signed)
Patient Name: Christy Poole Date of Encounter: 09/11/2023 [facility] Cardiologist: Jodelle Red, MD   Interval Summary  .    Feeling OK.  Still SOB and productive cough.   Vital Signs .    Vitals:   09/10/23 2309 09/11/23 0303 09/11/23 0733 09/11/23 0744  BP: 133/73 (!) 147/87 (!) 148/74   Pulse: 61 70 90 98  Resp: 16 14 17 20   Temp: 97.8 F (36.6 C) 97.8 F (36.6 C)    TempSrc: Oral Oral Oral   SpO2: 93% 95% 94% 94%  Weight:  48 kg    Height:        Intake/Output Summary (Last 24 hours) at 09/11/2023 1050 Last data filed at 09/10/2023 2207 Gross per 24 hour  Intake 680 ml  Output 500 ml  Net 180 ml      09/11/2023    3:03 AM 09/10/2023    4:25 AM 09/09/2023    5:00 AM  Last 3 Weights  Weight (lbs) 105 lb 13.1 oz 105 lb 13.1 oz 106 lb 4.2 oz  Weight (kg) 48 kg 48 kg 48.2 kg      Telemetry/ECG    Atrial fibrillation.  Rate <100 bpm - Personally Reviewed  LHC/RHC 09/07/23:   Prox LAD to Mid LAD lesion is 5% stenosed.   1.  Minimal nonobstructive coronary artery disease with patent mid LAD stents. 2.  Fick cardiac output of 4.8 L/min and Fick cardiac index of 3.3 L/min/m with the following hemodynamics:             Right atrial pressure mean of 15 mmHg             Right ventricular pressure 46/21 with an end-diastolic pressure of 14 mmHg             PA pressure 47/33 with a mean of 37 mmHg             Wedge pressure mean of 23 mmHg             PVR of 2.9 Woods units             PA pulsatility index of 0.9 consistent with right ventricular dysfunction 3.  LVEDP of 22 mmHg   Recommendation: Continue evaluation and treatment of severe recurrent rheumatic mitral stenosis per cardiology rounding team.  Echo 09/03/13:   1. Left ventricular ejection fraction, by estimation, is 55 to 60%. The  left ventricle has normal function. The left ventricle has no regional  wall motion abnormalities. indeterminate due to atrial fibrillation. There   is the interventricular septum is  flattened in systole, consistent with right ventricular pressure overload.   2. Right ventricular systolic function is mildly reduced. The right  ventricular size is normal. There is normal pulmonary artery systolic  pressure.   3. Left atrial size was severely dilated.   4. Difficult to assess given atrial fibrillation with rapid rate but  appears MS is severe by mean gradient ( ) and by PHT (1.2-1.4cm^2).  The mitral valve is rheumatic. Trivial mitral valve regurgitation. Severe  mitral stenosis. The mean mitral  valve gradient is 11.2 mmHg.   5. The aortic valve is tricuspid. Aortic valve regurgitation is not  visualized.   6. The inferior vena cava is normal in size with <50% respiratory  variability, suggesting right atrial pressure of 8 mmHg.   Physical Exam .    VS:  BP (!) 148/74 (BP Location: Right Arm)   Pulse 98  Temp 97.8 F (36.6 C) (Oral)   Resp 20   Ht 4\' 11"  (1.499 m)   Wt 48 kg   SpO2 94%   BMI 21.37 kg/m  , BMI Body mass index is 21.37 kg/m. GENERAL:  Ill-appearing.  Fatigued HEENT: Pupils equal round and reactive, fundi not visualized, oral mucosa unremarkable NECK:  No jugular venous distention, waveform within normal limits, carotid upstroke brisk and symmetric, no bruits, no thyromegaly LUNGS:  Bilateral rhonchi HEART:  Irregularly irregular.  PMI not displaced or sustained,S1 and S2 within normal limits, no S3, no S4, no clicks, no rubs, no murmurs ABD:  Flat, positive bowel sounds normal in frequency in pitch, no bruits, no rebound, no guarding, no midline pulsatile mass, no hepatomegaly, no splenomegaly EXT:  2 plus pulses throughout, no edema, no cyanosis no clubbing SKIN:  No rashes no nodules NEURO:  Cranial nerves II through XII grossly intact, motor grossly intact throughout PSYCH:  Cognitively intact, oriented to person place and time   Assessment & Plan .     11F with mitral stenosis, permanent AF,  CVA, chronic bronchitis, hypertension, hyperlipidemia and diabetes admitted with chest pain and SOB x 2 months.  Found to have acute bronchitis and bronchopneumonia.  # Bronchopneumonia: # Hypoxia: Concern for chronic aspiration and dysphagia.  Management per primary team.   # Severe mitral stenosis:  S/p balloon valvuloplasty ~10 years ago.  Mean gradient 11 mmHg on echo this admission.  Plan for outpatient TEE and surgery evaluation once stable. BNP 152, increased from 129 3 days ago and down from 283 on admission.  She has been receiving lasix 40mg  IV daily.  Limited diuresis.  She appears to be euvolemic and lung exam is more consistent with rhonchi and wheezes than crackles.  Will transition to lasix 40mg  po daily.   # Permanent atrial fibrillation, Rheumatic: Coumadin therapeutic.  D/c lovenox.  Continue metoprolol.  # Pulmonary HTN:  Secondary to severe MS and likely chronic lung disease.  # Hypertension:  Continue metoprolol and sprionolactone.  # Hyperlipidemia:    Continue atorvastatin.   For questions or updates, please contact [facility] Please consult [email or web address] for contact info under        Signed, Chilton Si, MD

## 2023-09-11 NOTE — Evaluation (Signed)
Physical Therapy Evaluation Patient Details Name: Christy Poole MRN: [id number] DOB: 05-20-1961 Today's Date: 09/11/2023  History of Present Illness  Christy Poole is a 62 y.o. female who presented to ED with cough, SOB, and chest pain.  PMH: hyperlipidemia, hypothyroidism, stroke, diabetes, atrial fibrillation, chronic bronchitis, rheumatoid mitral stenosis   Clinical Impression  Pt admitted with above. Pt presenting with generalized weakness, decreased activity tolerance, productive cough, SpO2 >90% on 2Lo2 via [place], and noted episode of dizziness getting up from commode. Pt to benefit from HHPT services to assist in progressing above deficits and minimize pt's fall risk as pt was mod I with RW PTA. Acute PT to cont to follow.        If plan is discharge home, recommend the following: A little help with walking and/or transfers;A little help with bathing/dressing/bathroom;Help with stairs or ramp for entrance;Assist for transportation;Assistance with cooking/housework   Can travel by private vehicle        Equipment Recommendations None recommended by PT  Recommendations for Other Services       Functional Status Assessment Patient has had a recent decline in their functional status and demonstrates the ability to make significant improvements in function in a reasonable and predictable amount of time.     Precautions / Restrictions Precautions Precautions: Fall Precaution Comments: SOB, cough Restrictions Weight Bearing Restrictions: No      Mobility  Bed Mobility Overal bed mobility: Needs Assistance Bed Mobility: Supine to Sit     Supine to sit: Min assist     General bed mobility comments: HOB slightly elevated, pt able to use bed rail and transfer self to sitting EOB, pt reached for PT to assist with pulling self forward to get feet on the floor    Transfers Overall transfer level: Needs assistance Equipment used: None Transfers: Sit to/from Stand Sit to  Stand: Contact guard assist           General transfer comment: contact guard for safety due to multiple lines and supplemental O2, pt did require minA when getting up off the commode as pt appeared dizzy and reached for doorway to hold onto    Ambulation/Gait Ambulation/Gait assistance: Min assist Gait Distance (Feet): 120 Feet Assistive device: Rolling walker (2 wheels) Gait Pattern/deviations: Step-through pattern, Decreased stride length, Trunk flexed Gait velocity: dec     General Gait Details: HR into 130s, SpO2 at 90% on 2Lo2 via [place], noted onset of fatigue SOB  Stairs            Wheelchair Mobility     Tilt Bed    Modified Rankin (Stroke Patients Only)       Balance Overall balance assessment: Needs assistance Sitting-balance support: Feet supported, No upper extremity supported Sitting balance-Leahy Scale: Good     Standing balance support: During functional activity, Single extremity supported Standing balance-Leahy Scale: Fair Standing balance comment: required unilateral UE support to wash R hand s/p tolieting                             Pertinent Vitals/Pain Pain Assessment Pain Assessment: No/denies pain    Home Living Family/patient expects to be discharged to:: Private residence Living Arrangements: Children Available Help at Discharge: Family;Available PRN/intermittently (son and dtr in law work) Type of Home: House Home Access: Level entry     Alternate Level Stairs-Number of Steps: 15 Home Layout: Two level;Able to live on main level with bedroom/bathroom Home  Equipment: Agricultural consultant (2 wheels);Shower seat;BSC/3in1      Prior Function Prior Level of Function : Needs assist             Mobility Comments: uses RW in home, limited community mobility ADLs Comments: reports indep with ADLs     Extremity/Trunk Assessment   Upper Extremity Assessment Upper Extremity Assessment: Generalized weakness    Lower  Extremity Assessment Lower Extremity Assessment: Generalized weakness    Cervical / Trunk Assessment Cervical / Trunk Assessment: Normal  Communication   Communication Communication: Other (comment) (Gujarati interpreter used, Priya [phone])  Cognition Arousal: Alert Behavior During Therapy: WFL for tasks assessed/performed Overall Cognitive Status: Within Functional Limits for tasks assessed                                 General Comments: pt able to follow commands        General Comments General comments (skin integrity, edema, etc.): pt assisted to commode, pt supervision for hygiene, pt with cough, HR up to 120s during ambulation, SpO2 >90% on 2lo2 via [place]    Exercises     Assessment/Plan    PT Assessment Patient needs continued PT services  PT Problem List Decreased strength;Decreased range of motion;Decreased activity tolerance;Decreased balance;Decreased mobility;Decreased coordination;Decreased cognition;Decreased knowledge of use of DME       PT Treatment Interventions DME instruction;Gait training;Stair training;Functional mobility training;Balance training;Therapeutic exercise;Therapeutic activities    PT Goals (Current goals can be found in the Care Plan section)  Acute Rehab PT Goals Patient Stated Goal: didn't state PT Goal Formulation: With patient Time For Goal Achievement: 09/25/23 Potential to Achieve Goals: Good    Frequency Min 1X/week     Co-evaluation               AM-PAC PT "6 Clicks" Mobility  Outcome Measure Help needed turning from your back to your side while in a flat bed without using bedrails?: A Little Help needed moving from lying on your back to sitting on the side of a flat bed without using bedrails?: A Little Help needed moving to and from a bed to a chair (including a wheelchair)?: A Little Help needed standing up from a chair using your arms (e.g., wheelchair or bedside chair)?: A Little Help needed to walk  in hospital room?: A Little Help needed climbing 3-5 steps with a railing? : A Lot 6 Click Score: 17    End of Session Equipment Utilized During Treatment: Gait belt;Oxygen Activity Tolerance: Patient limited by fatigue Patient left: in bed;with call bell/phone within reach;with bed alarm set Nurse Communication: Mobility status PT Visit Diagnosis: Unsteadiness on feet (R26.81)    Time: 9562-1308 PT Time Calculation (min) (ACUTE ONLY): 30 min   Charges:   PT Evaluation $PT Eval Moderate Complexity: 1 Mod PT Treatments $Gait Training: 8-22 mins PT General Charges $$ ACUTE PT VISIT: 1 Visit         Lewis Shock, PT, DPT Acute Rehabilitation Services Secure chat preferred Office #: [phone]   Iona Hansen 09/11/2023, 1:36 PM

## 2023-09-11 NOTE — Progress Notes (Addendum)
PHARMACY - ANTICOAGULATION  Pharmacy Consult for warfarin/enoxaparin Indication: atrial fibrillation  No Known Allergies  Patient Measurements: Height: 4\' 11"  (149.9 cm) Weight: 48 kg (105 lb 13.1 oz) IBW/kg (Calculated) : 43.2  Vital Signs: Temp: 97.8 F (36.6 C) (11/18 0303) Temp Source: Oral (11/18 0733) BP: 148/74 (11/18 0733) Pulse Rate: 98 (11/18 0744)  Labs: Recent Labs    09/08/23 1126 09/09/23 0218 09/09/23 0218 09/10/23 0226 09/11/23 0317  HGB  --  13.9   < > 14.5 13.5  HCT  --  42.3  --  44.2 41.4  PLT  --  242  --  279 253  LABPROT  --  19.5*  --  23.2* 31.1*  INR  --  1.6*  --  2.0* 3.0*  CREATININE 1.06*  --   --   --   --    < > = values in this interval not displayed.    Estimated Creatinine Clearance: 36.6 mL/min (A) (by C-G formula based on SCr of 1.06 mg/dL (H)).   Assessment: 62 y.o. female with g/o Afib, warfarin on hold for cath and INR < 2.5. Was bridged with IV heparin prior to cath. Warfarin now resumed with enoxaparin bridge. Home warfarin dose 3.75mg  daily except 5mg  Mon/Fri.  INR is therapeutic today at 2.0, below patient-specific goal but within therapeutic range. Discussed with MD and will go ahead and stop enoxaparin dosing.  11/18 AM update: INR 3.0 (patient's INR has steadily increased from 1.3 >>3.0 on 5 mg x3 doses) Vitamin K was given in response to an INR of 4.2 on 11/13 (2.5 mg PO K)  Goal of Therapy:  INR 2.5 - 3.0 Monitor platelets by anticoagulation protocol: Yes   Plan:  Warfarin 0.5 mg PO x1 today Daily INR   Molly Savarino BS, PharmD, BCPS Clinical Pharmacist 09/11/2023 3:18 PM  Contact: [phone] after 3 PM  "Be curious, not judgmental..." -Debbora Dus

## 2023-09-11 NOTE — Plan of Care (Signed)

## 2023-09-11 NOTE — Progress Notes (Signed)
Mobility Specialist Progress Note:   09/11/23 0900  Oxygen Therapy  O2 Device Nasal Cannula  O2 Flow Rate (L/min) 2 L/min  Mobility  Activity Ambulated with assistance in hallway  Level of Assistance Contact guard assist, steadying assist  Assistive Device Front wheel walker  Distance Ambulated (ft) 200 ft  Activity Response Tolerated well  Mobility Referral Yes  $Mobility charge 1 Mobility  Mobility Specialist Start Time (ACUTE ONLY) [phone]  Mobility Specialist Stop Time (ACUTE ONLY) 0859  Mobility Specialist Time Calculation (min) (ACUTE ONLY) 16 min    Pre Mobility: 98 HR,  91% SpO2 During Mobility: 106 HR,  91% SpO2 Post Mobility:  85 HR,  94% SpO2  Pt received ambulating in room, requesting assistance to BR. Void successful. Asymptomatic w/ no complaints throughout hallway ambulation. Pt left in bed with call bell and all needs met.  [name] Mobility Specialist Please contact via Special educational needs teacher or Rehab office at [phone]

## 2023-09-11 NOTE — TOC Initial Note (Signed)
Transition of Care [facility]) - Initial/Assessment Note    Patient Details  Name: Christy Poole MRN: [id number] Date of Birth: Jan 10, 1961  Transition of Care [facility]) CM/SW Contact:    Harriet Masson, RN Phone Number: 09/11/2023, 10:08 AM  Clinical Narrative:                 Spoke to patient's son at bedside regarding transition needs.  [name], son, stated CAP-DA was started by PCP in April 2024 and packet returned. Spoke to OP case manager ,[name], who stated she is going to complete the paperwork to exempted this process. [name] made aware.  TOC will continue to follow for needs. Expected Discharge Plan: Home/Self Care Barriers to Discharge: Continued Medical Work up   Patient Goals and CMS Choice Patient states their goals for this hospitalization and ongoing recovery are:: return home          Expected Discharge Plan and Services       Living arrangements for the past 2 months: Single Family Home                                      Prior Living Arrangements/Services Living arrangements for the past 2 months: Single Family Home Lives with:: Adult Children   Do you feel safe going back to the place where you live?: Yes      Need for Family Participation in Patient Care: Yes (Comment) Care giver support system in place?: Yes (comment)   Criminal Activity/Legal Involvement Pertinent to Current Situation/Hospitalization: No - Comment as needed  Activities of Daily Living   ADL Screening (condition at time of admission) Independently performs ADLs?: Yes (appropriate for developmental age) Is the patient deaf or have difficulty hearing?: No Does the patient have difficulty seeing, even when wearing glasses/contacts?: No Does the patient have difficulty concentrating, remembering, or making decisions?: No  Permission Sought/Granted                  Emotional Assessment Appearance:: Appears stated age       Alcohol / Substance Use: Not  Applicable Psych Involvement: No (comment)  Admission diagnosis:  Respiratory distress [R06.03] SOB (shortness of breath) [R06.02] Respiratory failure with hypoxia, unspecified chronicity (HCC) [J96.91] Patient Active Problem List   Diagnosis Date Noted   AKI (acute kidney injury) (HCC) 09/08/2023   Renal insufficiency 09/06/2023   Supratherapeutic INR 09/06/2023   Dyspnea on exertion 09/05/2023   Precordial chest pain 09/05/2023   Respiratory distress 09/04/2023   Cough 08/30/2023   Seizure-like activity (HCC) 08/10/2022   Pulmonary nodule 02/10/2022   Long term (current) use of anticoagulants 12/17/2021   Thrombocytopenia (HCC) 12/10/2021   Rheumatic mitral stenosis 12/10/2021   Left hand weakness 12/09/2021   Diabetes mellitus (HCC) 12/09/2021   Hypothyroidism 12/01/2021   Permanent atrial fibrillation (HCC) 12/01/2021   Hx-TIA (transient ischemic attack) 12/01/2021   Dyslipidemia 12/01/2021   PCP:  de [place], Raymond J, MD Pharmacy:   [facility] - [place], [place] - [address] [place] [address] Phone: [phone] Fax: [phone]  [facility] [address] [place] [address] Phone: [phone] Fax: [phone]     Social Determinants of Health (SDOH) Social History: SDOH Screenings   Food Insecurity: No Food Insecurity (09/05/2023)  Recent Concern: Food Insecurity - Food Insecurity Present (08/30/2023)  Housing: Low Risk  (09/05/2023)  Recent Concern: Housing - Medium Risk (08/30/2023)  Transportation Needs: No Transportation Needs (09/05/2023)  Recent Concern: Transportation Needs - Unmet Transportation Needs (08/30/2023)  Utilities: Not At Risk (09/05/2023)  Alcohol Screen: Low Risk  (08/30/2023)  Depression (PHQ2-9): Low Risk  (03/22/2023)  Financial Resource Strain: High Risk (08/30/2023)  Physical Activity: Inactive (08/30/2023)  Social Connections: Socially  Isolated (08/30/2023)  Stress: Stress Concern Present (08/30/2023)  Tobacco Use: Low Risk  (09/04/2023)   SDOH Interventions:     Readmission Risk Interventions     No data to display

## 2023-09-11 NOTE — Progress Notes (Signed)
   NAME:  Christy Poole, MRN:  [id number], DOB:  04/29/1961, LOS: 6 ADMISSION DATE:  09/04/2023, CONSULTATION DATE:  09/08/2023 REFERRING MD:  Dr Rennis Chris, CHIEF COMPLAINT: Bronchitis  History of Present Illness:  Asked to see patient for bronchitis  History of hyperlipidemia, hypothyroidism, stroke, diabetes, atrial fibrillation, chronic bronchitis mitral stenosis who had presented with worsening cough, shortness of breath, chest pain with symptoms worsening over the last 2 months  Did see Dr. Everardo All recently in the office, diagnosed with chronic bronchitis, started on Dulera -CT scan of the chest obtained showing mediastinal adenopathy, lung nodules, mosaic attenuation-mosaic attenuation was similarly present on a CAT scan that was performed in December 2023 with no significant progression Spirometry significant for restrictive disease-still needs a full PFT to ascertain  S/p cardiac catheterization 11/14-evidence of pulmonary hypertension with mean pulmonary arterial pressure of 37, pulmonary vascular resistance of 2.9  Pertinent  Medical History   Past Medical History:  Diagnosis Date   Atrial fibrillation (HCC)    Diabetes mellitus without complication (HCC)    Stroke (HCC)    Thyroid disease     Significant Hospital Events: Including procedures, antibiotic start and stop dates in addition to other pertinent events   11/14 cardiac catheterization 11/15 pulmonary consult   Interim History / Subjective:   Breathing is a little bit better  Objective   Blood pressure (!) 112/99, pulse 80, temperature 97.7 F (36.5 C), temperature source Oral, resp. rate 18, height 4\' 11"  (1.499 m), weight 48 kg, SpO2 92%.        Intake/Output Summary (Last 24 hours) at 09/11/2023 1644 Last data filed at 09/11/2023 1415 Gross per 24 hour  Intake 680 ml  Output 500 ml  Net 180 ml   Filed Weights   09/09/23 0500 09/10/23 0425 09/11/23 0303  Weight: 48.2 kg 48 kg 48 kg     Examination: Gen:      In no acute distress HEENT:  EOMI, sclera anicteric Neck:     No masses; no thyromegaly Lungs:   Clear to auscultation CV:         S1-S2 appreciated Abd:      Sounds appreciated  Lab/imaging reviewed  High-res CT reviewed by myself-mosaic appearance basal infiltrate, dilatation of atria  Resolved Hospital Problem list     Assessment & Plan:   Multifactorial shortness of breath -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 Everardo All as outpatient   Virl Diamond, MD [facility] Pager: See Loretha Stapler

## 2023-09-11 NOTE — Progress Notes (Signed)
Speech Language Pathology Treatment: Dysphagia  Patient Details Name: KINDYL BARSCH MRN: [id number] DOB: [birth year] Today's Date: 09/11/2023 Time: 4098-1191 SLP Time Calculation (min) (ACUTE ONLY): 10 min  Assessment / Plan / Recommendation Clinical Impression  Able to visit pt at the end of her meal, which she tolerated well. Used Gujarati interpreter to collect more history from pt. Pt reports congested cough, difficulty masticating with new dentures and food falling out of her mouth. When asked if she coughs when she drinks water she reported sometimes. Will proceed with MBS for instrumental assessment. Explained to pt via interpreter and she is agreeable to plan. MBS tomorrow.   HPI HPI: Patient is a 62 y.o. 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 Chest x-ray with increased interstitial markings favoring edema/CHF but unable to exclude superimposed pneumonitis. SLP swallow evalution ordered on 09/10/23 due to patient observed to have increased coughing when taking medications and after PO's with her breakfast tray.      SLP Plan  MBS      Recommendations for follow up therapy are one component of a multi-disciplinary discharge planning process, led by the attending physician.  Recommendations may be updated based on patient status, additional functional criteria and insurance authorization.    Recommendations  Diet recommendations: Regular;Thin liquid Liquids provided via: Cup;Straw Medication Administration: Whole meds with puree Supervision: Patient able to self feed Compensations: Slow rate;Small sips/bites Postural Changes and/or Swallow Maneuvers: Seated upright 90 degrees                              MBS     Livy Ross, Riley Nearing  09/11/2023, 1:34 PM

## 2023-09-11 NOTE — Progress Notes (Signed)
[facility] PROGRESS NOTE    Progress Note  Christy Poole  IHK:[id number] DOB: [birth year] DOA: 09/04/2023 PCP: de [place], Raymond J, MD     Brief Narrative:   Christy Poole is an 62 y.o. 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  In the ED was tachypneic tachycardic, respiratory panel flu COVID RSV was negative.  Chest x-ray showed interstitial marking and twelve-lead EKG showed A-fib with RVR started on IV Solu-Medrol inhalers and antibiotics cardiology was consulted.  2D echo was done that showed a normally EF flattened interventricular septum during systole consistent with right ventricular overload and mildly reduced RV function with mitral stenosis severe.  Cardiology thinks this is most likely pulmonary driven.  Started on a Z-Pak and Tussidex.  09/08/2023 had worsening sputum production repeated chest x-ray showed no evidence of pulmonary edema worsening infection pulmonary was consulted CT scan of the chest showed multilevel bilateral bronchopneumonia   Assessment/Plan:   Acute respiratory failure with hypoxia due to bronchopneumonia: CT of the chest showed bilateral bronchopneumonia. Sputum growing gram-positive cocci was restarted on Rocephin pulmonary was consulted. 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  No evidence of interstitial lung disease. Pulmonary recommended to continue inhalers steroids finish antibiotics and take with IV Rocephin. Speech evaluation to rule out aspiration. Continue to taper down steroids. Pulmonary nodule to be followed up as an outpatient.  Permanent A-fib with RVR: Initially resolved with  Cardizem drip now metoprolol and titrating. 2D echo showed preserved EF. Further management per cardiology.  Currently on Coumadin goal INR 2-3.  INR today is 3. Continue Coumadin per pharmacy. Continue metoprolol 150 mg orally.  Severe rheumatic mitral stenosis: With history of balloon valvuloplasty pending surgical evaluation. Currently on IV Lasix per cardiology. Now on oral metoprolol rate control.  Diabetes mellitus type 2: Currently on steroids which will make her blood glucose erratic started on long-acting insulin. Discontinue sliding scale.  On sliding scale plus long-acting insulin with meal coverage taper down steroids.  History of CVA: Continue aspirin and Coumadin.  Dyslipidemia: Continue statin  Hypothyroidism: Continue Synthroid  Complex partial seizures: Continue Keppra.  Worsening leukocytosis: In the setting of steroids and infection. He has remained afebrile. Procalcitonin was unremarkable. Continue IV Rocephin and wean down steroids.    DVT prophylaxis: coumadin Family Communication:none Status is: Inpatient Remains inpatient appropriate because: Acute bronchitis with severe mitral stenosis and A-fib with RVR    Code Status:     Code Status Orders  (From admission, onward)           Start     Ordered   09/04/23 1626  Full code  Continuous       Question:  By:  Answer:  Consent: discussion documented in EHR   09/04/23 1628           Code Status History     Date Active Date Inactive Code Status Order ID Comments User Context   12/09/2021 1652 12/11/2021 1816 Full Code [id number]  Tyrone Nine, MD Inpatient         IV Access:   Peripheral IV   Procedures and diagnostic studies:   CT Chest High Resolution  Result Date: 09/10/2023 CLINICAL DATA:  62 year old female with  history of weakness and confusion. Suspected interstitial lung disease. EXAM: CT CHEST WITHOUT CONTRAST TECHNIQUE: Multidetector CT imaging of the chest was  performed following the standard protocol without intravenous contrast. High resolution imaging of the lungs, as well as inspiratory and expiratory imaging, was performed. RADIATION DOSE REDUCTION: This exam was performed according to the departmental dose-optimization program which includes automated exposure control, adjustment of the mA and/or kV according to patient size and/or use of iterative reconstruction technique. COMPARISON:  Chest CT 06/16/2023. FINDINGS: Cardiovascular: Heart size is enlarged with biatrial dilatation. There is no significant pericardial fluid, thickening or pericardial calcification. There is aortic atherosclerosis, as well as atherosclerosis of the great vessels of the mediastinum and the coronary arteries, including calcified atherosclerotic plaque in the left main, left anterior descending, left circumflex and right coronary arteries. Calcifications of the mitral annulus. Mediastinum/Nodes: Prominent AP window lymph node, similar to the prior examination, measuring 1 cm in short axis (previously not hypermetabolic on prior PET-CT 12/09/2022), presumably benign. No other pathologically enlarged mediastinal or hilar lymph nodes. Esophagus is unremarkable in appearance. No axillary lymphadenopathy. Lungs/Pleura: High-resolution imaging is limited by considerable patient respiratory motion. With these limitations in mind, there is some patchy ground-glass attenuation interspersed with areas of lucency, likely reflecting areas of air trapping from small airways disease. This is similar to prior studies. In addition, today's study demonstrates extensive thickening of the peribronchovascular interstitium with regional areas of peribronchovascular consolidation, ground-glass attenuation and micro nodularity scattered throughout the lungs bilaterally, overall appearance of which favors bronchopneumonia. Consolidation is most confluent in the inferior segment of the lingula and in the lower  lobes of the lungs bilaterally. No generalized areas of septal thickening, traction bronchiectasis or honeycombing are noted. Upper Abdomen: Unremarkable. Musculoskeletal: There are no aggressive appearing lytic or blastic lesions noted in the visualized portions of the skeleton. IMPRESSION: 1. No compelling findings to suggest interstitial lung disease. The overall appearance of the lungs suggests multilobar bilateral bronchopneumonia. 2. There is also evidence of extensive air trapping from small airways disease. 3. Cardiomegaly with biatrial dilatation. 4. Aortic atherosclerosis, in addition to left main and three-vessel coronary artery disease. Please note that although the presence of coronary artery calcium documents the presence of coronary artery disease, the severity of this disease and any potential stenosis cannot be assessed on this non-gated CT examination. Assessment for potential risk factor modification, dietary therapy or pharmacologic therapy may be warranted, if clinically indicated. 5. There are calcifications of the mitral annulus. Echocardiographic correlation for evaluation of potential valvular dysfunction may be warranted if clinically indicated. Aortic Atherosclerosis (ICD10-I70.0). Electronically Signed   By: Trudie Reed M.D.   On: 09/10/2023 06:39     Medical Consultants:   None.   Subjective:    Christy Poole relates her breathing  Objective:    Vitals:   09/10/23 2309 09/11/23 0303 09/11/23 0733 09/11/23 0744  BP: 133/73 (!) 147/87 (!) 148/74   Pulse: 61 70 90 98  Resp: 16 14 17 20   Temp: 97.8 F (36.6 C) 97.8 F (36.6 C)    TempSrc: Oral Oral Oral   SpO2: 93% 95% 94% 94%  Weight:  48 kg    Height:       SpO2: 94 % O2 Flow Rate (L/min): 2 L/min FiO2 (%): 94 %   Intake/Output Summary (Last 24 hours) at 09/11/2023 0800 Last data filed at 09/10/2023 2207 Gross per 24 hour  Intake 680 ml  Output 800 ml  Net -120 ml  Filed Weights   09/09/23  0500 09/10/23 0425 09/11/23 0303  Weight: 48.2 kg 48 kg 48 kg    Exam: General exam: In no acute distress. Respiratory system: Good air movement and clear to auscultation. Cardiovascular system: S1 & S2 heard, RRR. No JVD. Gastrointestinal system: Abdomen is nondistended, soft and nontender.  Extremities: No pedal edema. Skin: No rashes, lesions or ulcers Psychiatry: Judgement and insight appear normal. Mood & affect appropriate.    Data Reviewed:    Labs: Basic Metabolic Panel: Recent Labs  Lab 09/04/23 1144 09/04/23 1335 09/05/23 0421 09/06/23 0623 09/07/23 0242 09/07/23 1615 09/07/23 1619 09/07/23 1621 09/08/23 1126  NA 138  --  135 136 133* 139 141 140 136  K 4.0  --  3.8 4.4 4.8 4.3 4.2 4.0 3.7  CL 104  --  100 102 99  --   --   --  98  CO2 23  --  23 26 27   --   --   --  27  GLUCOSE 140*  --  249* 130* 144*  --   --   --  151*  BUN 21  --  19 47* 40*  --   --   --  35*  CREATININE 0.80  --  0.91 1.18* 0.76  --   --   --  1.06*  CALCIUM 9.7  --  9.8 9.8 9.5  --   --   --  10.0  MG  --  2.0  --   --   --   --   --   --   --    GFR Estimated Creatinine Clearance: 36.6 mL/min (A) (by C-G formula based on SCr of 1.06 mg/dL (H)). Liver Function Tests: Recent Labs  Lab 09/05/23 0421  AST 21  ALT 19  ALKPHOS 72  BILITOT 0.8  PROT 7.8  ALBUMIN 3.5   No results for input(s): "LIPASE", "AMYLASE" in the last 168 hours. No results for input(s): "AMMONIA" in the last 168 hours. Coagulation profile Recent Labs  Lab 09/07/23 0242 09/08/23 0241 09/09/23 0218 09/10/23 0226 09/11/23 0317  INR 2.0* 1.3* 1.6* 2.0* 3.0*   COVID-19 Labs  No results for input(s): "DDIMER", "FERRITIN", "LDH", "CRP" in the last 72 hours.  Lab Results  Component Value Date   SARSCOV2NAA NEGATIVE 09/04/2023   SARSCOV2NAA NEGATIVE 12/15/2021   SARSCOV2NAA NEGATIVE 12/09/2021   SARSCOV2NAA NEGATIVE 10/21/2021    CBC: Recent Labs  Lab 09/07/23 0857 09/07/23 1615 09/07/23 1621  09/08/23 0241 09/09/23 0218 09/10/23 0226 09/11/23 0317  WBC 15.8*  --   --  14.2* 13.9* 13.8* 13.9*  HGB 13.8   < > 13.3 13.1 13.9 14.5 13.5  HCT 42.0   < > 39.0 39.7 42.3 44.2 41.4  MCV 87.5  --   --  86.3 84.8 84.5 84.3  PLT 215  --   --  230 242 279 253   < > = values in this interval not displayed.   Cardiac Enzymes: No results for input(s): "CKTOTAL", "CKMB", "CKMBINDEX", "TROPONINI" in the last 168 hours. BNP (last 3 results) No results for input(s): "PROBNP" in the last 8760 hours. CBG: Recent Labs  Lab 09/10/23 0536 09/10/23 1139 09/10/23 1610 09/10/23 2109 09/11/23 0613  GLUCAP 111* 97 313* 302* 105*   D-Dimer: No results for input(s): "DDIMER" in the last 72 hours. Hgb A1c: No results for input(s): "HGBA1C" in the last 72 hours. Lipid Profile: No results for input(s): "CHOL", "HDL", "LDLCALC", "TRIG", "CHOLHDL", "  LDLDIRECT" in the last 72 hours. Thyroid function studies: No results for input(s): "TSH", "T4TOTAL", "T3FREE", "THYROIDAB" in the last 72 hours.  Invalid input(s): "FREET3" Anemia work up: No results for input(s): "VITAMINB12", "FOLATE", "FERRITIN", "TIBC", "IRON", "RETICCTPCT" in the last 72 hours. Sepsis Labs: Recent Labs  Lab 09/04/23 1335 09/05/23 0421 09/06/23 0865 09/08/23 0241 09/09/23 0218 09/10/23 0226 09/11/23 0317  PROCALCITON <0.10 <0.10  --   --   --   --   --   WBC  --  13.4*   < > 14.2* 13.9* 13.8* 13.9*   < > = values in this interval not displayed.   Microbiology Recent Results (from the past 240 hour(s))  Resp panel by RT-PCR (RSV, Flu A&B, Covid) Anterior Nasal Swab     Status: None   Collection Time: 09/04/23  1:20 PM   Specimen: Anterior Nasal Swab  Result Value Ref Range Status   SARS Coronavirus 2 by RT PCR NEGATIVE NEGATIVE Final   Influenza A by PCR NEGATIVE NEGATIVE Final   Influenza B by PCR NEGATIVE NEGATIVE Final    Comment: (NOTE) The Xpert Xpress SARS-CoV-2/FLU/RSV plus assay is intended as an aid in  the diagnosis of influenza from Nasopharyngeal swab specimens and should not be used as a sole basis for treatment. Nasal washings and aspirates are unacceptable for Xpert Xpress SARS-CoV-2/FLU/RSV testing.  Fact Sheet for Patients: BloggerCourse.com  Fact Sheet for Healthcare Providers: SeriousBroker.it  This test is not yet approved or cleared by the [place] FDA and has been authorized for detection and/or diagnosis of SARS-CoV-2 by FDA under an Emergency Use Authorization (EUA). This EUA will remain in effect (meaning this test can be used) for the duration of the COVID-19 declaration under Section 564(b)(1) of the Act, 21 U.S.C. section 360bbb-3(b)(1), unless the authorization is terminated or revoked.     Resp Syncytial Virus by PCR NEGATIVE NEGATIVE Final    Comment: (NOTE) Fact Sheet for Patients: BloggerCourse.com  Fact Sheet for Healthcare Providers: SeriousBroker.it  This test is not yet approved or cleared by the [place] FDA and has been authorized for detection and/or diagnosis of SARS-CoV-2 by FDA under an Emergency Use Authorization (EUA). This EUA will remain in effect (meaning this test can be used) for the duration of the COVID-19 declaration under Section 564(b)(1) of the Act, 21 U.S.C. section 360bbb-3(b)(1), unless the authorization is terminated or revoked.  Performed at [facility], [address], [place], [place] [address]   Respiratory (~20 pathogens) panel by PCR     Status: None   Collection Time: 09/04/23  5:04 PM   Specimen: Nasopharyngeal Swab; Respiratory  Result Value Ref Range Status   Adenovirus NOT DETECTED NOT DETECTED Final   Coronavirus 229E NOT DETECTED NOT DETECTED Final    Comment: (NOTE) The Coronavirus on the Respiratory Panel, DOES NOT test for the novel  Coronavirus (2019 nCoV)    Coronavirus HKU1 NOT  DETECTED NOT DETECTED Final   Coronavirus NL63 NOT DETECTED NOT DETECTED Final   Coronavirus OC43 NOT DETECTED NOT DETECTED Final   Metapneumovirus NOT DETECTED NOT DETECTED Final   Rhinovirus / Enterovirus NOT DETECTED NOT DETECTED Final   Influenza A NOT DETECTED NOT DETECTED Final   Influenza B NOT DETECTED NOT DETECTED Final   Parainfluenza Virus 1 NOT DETECTED NOT DETECTED Final   Parainfluenza Virus 2 NOT DETECTED NOT DETECTED Final   Parainfluenza Virus 3 NOT DETECTED NOT DETECTED Final   Parainfluenza Virus 4 NOT DETECTED NOT  DETECTED Final   Respiratory Syncytial Virus NOT DETECTED NOT DETECTED Final   Bordetella pertussis NOT DETECTED NOT DETECTED Final   Bordetella Parapertussis NOT DETECTED NOT DETECTED Final   Chlamydophila pneumoniae NOT DETECTED NOT DETECTED Final   Mycoplasma pneumoniae NOT DETECTED NOT DETECTED Final    Comment: Performed at [facility], [address] [place], [place] [address]  MRSA Next Gen by PCR, Nasal     Status: None   Collection Time: 09/05/23  2:22 PM   Specimen: Nasal Mucosa; Nasal Swab  Result Value Ref Range Status   MRSA by PCR Next Gen NOT DETECTED NOT DETECTED Final    Comment: (NOTE) The GeneXpert MRSA Assay (FDA approved for NASAL specimens only), is one component of a comprehensive MRSA colonization surveillance program. It is not intended to diagnose MRSA infection nor to guide or monitor treatment for MRSA infections. Test performance is not FDA approved in patients less than 4 years old. Performed at [facility], [address] [place], [place] [address]   Expectorated Sputum Assessment w Gram Stain, Rflx to Resp Cult     Status: None   Collection Time: 09/08/23 12:54 PM   Specimen: Expectorated Sputum  Result Value Ref Range Status   Specimen Description EXPECTORATED SPUTUM  Final   Special Requests NONE  Final   Sputum evaluation   Final    THIS SPECIMEN IS ACCEPTABLE FOR SPUTUM CULTURE Performed at [facility], [address], [place], [place] [address]    Report Status 09/08/2023 FINAL  Final  Culture, Respiratory w Gram Stain     Status: None (Preliminary result)   Collection Time: 09/08/23 12:54 PM  Result Value Ref Range Status   Specimen Description EXPECTORATED SPUTUM  Final   Special Requests NONE Reflexed from [id number]  Final   Gram Stain   Final    MODERATE WBC PRESENT, PREDOMINANTLY PMN RARE GRAM POSITIVE COCCI IN PAIRS RARE GRAM POSITIVE RODS    Culture   Final    CULTURE REINCUBATED FOR BETTER GROWTH Performed at [facility], [address], [place], [place] [address]    Report Status PENDING  Incomplete     Medications:    arformoterol  15 mcg Nebulization BID   atorvastatin  80 mg Oral Daily   budesonide (PULMICORT) nebulizer solution  0.5 mg Nebulization BID   chlorpheniramine-HYDROcodone  5 mL Oral Q12H   famotidine  20 mg Oral BID   furosemide  40 mg Intravenous Daily   gabapentin  100 mg Oral QHS   insulin aspart  0-15 Units Subcutaneous TID WC   insulin aspart  0-5 Units Subcutaneous QHS   levETIRAcetam  250 mg Oral BID   levothyroxine  50 mcg Oral Q0600   metoprolol succinate  150 mg Oral Daily   predniSONE  40 mg Oral Q breakfast   revefenacin  175 mcg Nebulization Daily   sodium chloride flush  3 mL Intravenous Q12H   spironolactone  12.5 mg Oral Daily   warfarin  1 mg Oral ONCE-1600   Warfarin - Pharmacist Dosing Inpatient   Does not apply q1600   Continuous Infusions:  cefTRIAXone (ROCEPHIN)  IV 2 g (09/10/23 0930)      LOS: 6 days   Marinda Elk  [facility]  09/11/2023, 8:00 AM

## 2023-09-12 ENCOUNTER — Inpatient Hospital Stay (HOSPITAL_COMMUNITY): Payer: Medicaid Other

## 2023-09-12 DIAGNOSIS — I4821 Permanent atrial fibrillation: Secondary | ICD-10-CM | POA: Diagnosis not present

## 2023-09-12 DIAGNOSIS — J9601 Acute respiratory failure with hypoxia: Secondary | ICD-10-CM | POA: Diagnosis not present

## 2023-09-12 DIAGNOSIS — R053 Chronic cough: Secondary | ICD-10-CM | POA: Diagnosis not present

## 2023-09-12 DIAGNOSIS — I050 Rheumatic mitral stenosis: Secondary | ICD-10-CM | POA: Diagnosis not present

## 2023-09-12 DIAGNOSIS — J180 Bronchopneumonia, unspecified organism: Secondary | ICD-10-CM | POA: Diagnosis not present

## 2023-09-12 DIAGNOSIS — R0609 Other forms of dyspnea: Secondary | ICD-10-CM | POA: Diagnosis not present

## 2023-09-12 LAB — GLUCOSE, CAPILLARY
Glucose-Capillary: 96 mg/dL (ref 70–99)
Glucose-Capillary: 151 mg/dL — ABNORMAL HIGH (ref 70–99)
Glucose-Capillary: 161 mg/dL — ABNORMAL HIGH (ref 70–99)
Glucose-Capillary: 178 mg/dL — ABNORMAL HIGH (ref 70–99)

## 2023-09-12 LAB — CBC
HCT: 43.3 % (ref 36.0–46.0)
Hemoglobin: 14.1 g/dL (ref 12.0–15.0)
MCH: 27.6 pg (ref 26.0–34.0)
MCHC: 32.6 g/dL (ref 30.0–36.0)
MCV: 84.7 fL (ref 80.0–100.0)
Platelets: 286 10*3/uL (ref 150–400)
RBC: 5.11 MIL/uL (ref 3.87–5.11)
RDW: 13.2 % (ref 11.5–15.5)
WBC: 11.6 10*3/uL — ABNORMAL HIGH (ref 4.0–10.5)
nRBC: 0 % (ref 0.0–0.2)

## 2023-09-12 LAB — BASIC METABOLIC PANEL
Anion gap: 8 (ref 5–15)
BUN: 30 mg/dL — ABNORMAL HIGH (ref 8–23)
CO2: 26 mmol/L (ref 22–32)
Calcium: 9.5 mg/dL (ref 8.9–10.3)
Chloride: 101 mmol/L (ref 98–111)
Creatinine, Ser: 0.83 mg/dL (ref 0.44–1.00)
GFR, Estimated: >60 mL/min (ref >60)
Glucose, Bld: 113 mg/dL — ABNORMAL HIGH (ref 70–99)
Potassium: 4.6 mmol/L (ref 3.5–5.1)
Sodium: 135 mmol/L (ref 135–145)

## 2023-09-12 LAB — HEMOGLOBIN A1C
Hgb A1c MFr Bld: 7 % — ABNORMAL HIGH (ref 4.8–5.6)
Mean Plasma Glucose: 154.2 mg/dL

## 2023-09-12 LAB — PROTIME-INR
INR: 2.3 — ABNORMAL HIGH (ref 0.8–1.2)
Prothrombin Time: 25.8 s — ABNORMAL HIGH (ref 11.4–15.2)

## 2023-09-12 MED ORDER — INSULIN DETEMIR 100 UNIT/ML ~~LOC~~ SOLN
5.0000 [IU] | Freq: Every day | SUBCUTANEOUS | Status: DC
  Administered 2023-09-12 – 2023-09-13 (×2): 5 [IU] via SUBCUTANEOUS
  Filled 2023-09-12 (×2): qty 0.05

## 2023-09-12 MED ORDER — PREDNISONE 10 MG PO TABS
10.0000 mg | ORAL_TABLET | Freq: Every day | ORAL | Status: DC
  Administered 2023-09-12 – 2023-09-13 (×2): 10 mg via ORAL
  Filled 2023-09-12 (×2): qty 1

## 2023-09-12 MED ORDER — WARFARIN SODIUM 4 MG PO TABS
4.0000 mg | ORAL_TABLET | Freq: Once | ORAL | Status: AC
  Administered 2023-09-12: 4 mg via ORAL
  Filled 2023-09-12: qty 1

## 2023-09-12 NOTE — Progress Notes (Addendum)
I discussed / reviewed the pharmacy note by Basilio Cairo, PharmD candidate, and I agree with the student's findings and plans as documented.  Kiwan Gadsden BS, PharmD, BCPS Clinical Pharmacist 09/12/2023 8:55 AM  Contact: [phone] after 3 PM  "Be curious, not judgmental..." -Debbora Dus   PHARMACY - ANTICOAGULATION  Pharmacy Consult for warfarin Indication: atrial fibrillation  No Known Allergies  Patient Measurements: Height: 4\' 11"  (149.9 cm) Weight: 48.7 kg (107 lb 5.8 oz) IBW/kg (Calculated) : 43.2  Vital Signs: Temp: 97.8 F (36.6 C) (11/19 0721) Temp Source: Oral (11/19 0721) BP: 144/106 (11/19 0721) Pulse Rate: 77 (11/19 0721)  Labs: Recent Labs    09/10/23 0226 09/11/23 0317 09/11/23 0933 09/12/23 0233  HGB 14.5 13.5  --  14.1  HCT 44.2 41.4  --  43.3  PLT 279 253  --  286  LABPROT 23.2* 31.1*  --  25.8*  INR 2.0* 3.0*  --  2.3*  CREATININE  --   --  0.99 0.83    Estimated Creatinine Clearance: 46.7 mL/min (by C-G formula based on SCr of 0.83 mg/dL).   Assessment: 62 y.o. female with g/o Afib, warfarin on hold for cath and INR < 2.5. Was bridged with IV heparin prior to cath. Warfarin now resumed. Enoxaparin bridge completed. Home warfarin dose 3.75mg  daily except 5mg  Mon/Fri.  INR is therapeutic today at 2.3, below patient-specific goal but within therapeutic range.   11/19 AM update: INR 2.3 (patient's INR down from 3.0 yesterday following 0.5 mg dose on 11/18).  Vitamin K was given in response to an INR of 4.2 on 11/13 (2.5 mg PO K)  Goal of Therapy:  INR 2.5 - 3.0 Monitor platelets by anticoagulation protocol: Yes   Plan:  Warfarin 4 mg PO x1 today Daily INR   Basilio Cairo, PharmD Candidate 09/12/2023 7:27 AM

## 2023-09-12 NOTE — Progress Notes (Signed)
   NAME:  Christy Poole, MRN:  [id number], DOB:  Feb 23, 1961, LOS: 7 ADMISSION DATE:  09/04/2023, CONSULTATION DATE:  09/08/2023 REFERRING MD:  Dr Rennis Chris, CHIEF COMPLAINT: Bronchitis  History of Present Illness:  Asked to see patient for bronchitis  History of hyperlipidemia, hypothyroidism, stroke, diabetes, atrial fibrillation, chronic bronchitis mitral stenosis who had presented with worsening cough, shortness of breath, chest pain with symptoms worsening over the last 2 months  Did see Dr. Everardo All recently in the office, diagnosed with chronic bronchitis, started on Dulera -CT scan of the chest obtained showing mediastinal adenopathy, lung nodules, mosaic attenuation-mosaic attenuation was similarly present on a CAT scan that was performed in December 2023 with no significant progression Spirometry significant for restrictive disease-still needs a full PFT to ascertain  S/p cardiac catheterization 11/14-evidence of pulmonary hypertension with mean pulmonary arterial pressure of 37, pulmonary vascular resistance of 2.9  Pertinent  Medical History   Past Medical History:  Diagnosis Date   Atrial fibrillation (HCC)    Diabetes mellitus without complication (HCC)    Stroke (HCC)    Thyroid disease     Significant Hospital Events: Including procedures, antibiotic start and stop dates in addition to other pertinent events   11/14 cardiac catheterization 11/15 pulmonary consult   Interim History / Subjective:   No breathing issues at this time.  Objective   Blood pressure (!) 144/106, pulse 79, temperature 97.8 F (36.6 C), temperature source Oral, resp. rate (!) 21, height 4\' 11"  (1.499 m), weight 48.7 kg, SpO2 95%.        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 Mechele Collin as an outpatient.  He can call our office and make an appointment for her in the future.  Complete current course of treatment  Brett Canales Javid Kemler ACNP Acute Care Nurse Practitioner [facility] Please consult Amion 09/12/2023, 8:27 AM

## 2023-09-12 NOTE — Progress Notes (Signed)
Mobility Specialist Progress Note:    09/12/23 1605  Therapy Vitals  Temp 97.8 F (36.6 C)  Temp Source Oral  Pulse Rate 76  Resp 18  BP 130/78  Patient Position (if appropriate) Lying  Oxygen Therapy  SpO2 92 %  Mobility  Activity Ambulated with assistance in hallway  Level of Assistance Standby assist, set-up cues, supervision of patient - no hands on  Assistive Device Front wheel walker  Distance Ambulated (ft) 300 ft  Activity Response Tolerated well  Mobility Referral Yes  $Mobility charge 1 Mobility  Mobility Specialist Start Time (ACUTE ONLY) 1602  Mobility Specialist Stop Time (ACUTE ONLY) 1614  Mobility Specialist Time Calculation (min) (ACUTE ONLY) 12 min   Received pt in bed having no complaints and agreeable to mobility. Pt was asymptomatic throughout ambulation and returned to room w/o fault. Left in bed w/ call bell in reach and all needs met.   [name] Mobility Specialist Please contact via Special educational needs teacher or Rehab office at [phone]

## 2023-09-12 NOTE — Procedures (Signed)
Modified Barium Swallow Study  Patient Details  Name: Christy Poole MRN: [id number] Date of Birth: 08-12-1961  Today's Date: 09/12/2023  Modified Barium Swallow completed.  Full report located under Chart Review in the Imaging Section.  History of Present Illness Patient is a 62 y.o. 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 Chest x-ray with increased interstitial markings favoring edema/CHF but unable to exclude superimposed pneumonitis. SLP swallow evalution ordered on 09/10/23 due to patient observed to have increased coughing when taking medications and after PO's with her breakfast tray.   Clinical Impression Patient's overall oropharyngeal swallow is WFL as per this modified barium swallow study. Barium consistencies tested included: thin, nectar thick, puree, solid. No penetration or aspiration or any of the tested barium consistencies observed. Swallow was initiated at level of the pyriform sinus with thin liquids and at vallecular sinus with nectar thick liquids, puree solids and regular solid. No pharyngeal residuals observed after initial swallow and no retrograde movement of barium observed in pharynx or below PES. Esophageal sweep did not reveal any barium stasis or retrograde movement. 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 SLP is not recommending any further intervention in regards to patient's swallow function at this time.  Swallow Evaluation Recommendations Recommendations: PO diet PO Diet Recommendation: Regular;Thin liquids (Level 0) Liquid Administration via: Cup;Straw Medication Administration: Whole meds with liquid Supervision: Patient able to self-feed Postural changes: Position pt fully upright for meals Oral  care recommendations: Oral care BID (2x/day)      Angela Nevin, MA, CCC-SLP Speech Therapy

## 2023-09-12 NOTE — Progress Notes (Signed)
[facility] PROGRESS NOTE    Progress Note  Christy Poole  KYH:[id number] DOB: 23-Oct-1961 DOA: 09/04/2023 PCP: de [place], Raymond J, MD     Brief Narrative:   Christy Poole is an 62 y.o. 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  In the ED was tachypneic tachycardic, respiratory panel flu COVID RSV was negative.  Chest x-ray showed interstitial marking and twelve-lead EKG showed A-fib with RVR started on IV Solu-Medrol inhalers and antibiotics cardiology was consulted.  2D echo was done that showed a normally EF flattened interventricular septum during systole consistent with right ventricular overload and mildly reduced RV function with mitral stenosis severe.  Cardiology thinks this is most likely pulmonary driven.  Started on a Z-Pak and Tussidex.  09/08/2023 had worsening sputum production repeated chest x-ray showed no evidence of pulmonary edema worsening infection pulmonary was consulted CT scan of the chest showed multilevel bilateral bronchopneumonia   Assessment/Plan:   Acute respiratory failure with hypoxia due to bronchopneumonia: CT of the chest showed bilateral bronchopneumonia. No evidence of interstitial lung disease. Pulmonary was consulted recommended to continue antibiotics and inhalers, now they have signed off continue IV Rocephin. Continue to taper down steroids. Pulmonary nodule to be followed up as an outpatient.  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 2D echo showed preserved EF. Further management per cardiology.  Currently on Coumadin goal INR 2-3.  INR  today is 3. Continue Coumadin per pharmacy. Continue metoprolol 150 mg orally.  Severe rheumatic mitral stenosis: With history of balloon valvuloplasty pending surgical evaluation as an outpatient. Transition to oral Lasix. Now on oral metoprolol rate control. Evaluation as an outpatient for surgical intervention.  Diabetes mellitus type 2: Currently on steroids which will make her blood glucose erratic started on long-acting insulin. Discontinue sliding scale.  On sliding scale plus long-acting insulin with meal coverage taper down steroids.  History of CVA: Continue aspirin and Coumadin.  Dyslipidemia: Continue statin  Hypothyroidism: Continue Synthroid  Complex partial seizures: Continue Keppra.  Worsening leukocytosis: In the setting of steroids and infection. He has remained afebrile. Procalcitonin was unremarkable. Continue IV Rocephin and wean down steroids.    DVT prophylaxis: coumadin Family Communication:none Status is: Inpatient Remains inpatient appropriate because: Acute bronchitis with severe mitral stenosis and A-fib with RVR    Code Status:     Code Status Orders  (From admission, onward)           Start     Ordered   09/04/23 1626  Full code  Continuous       Question:  By:  Answer:  Consent: discussion documented in EHR   09/04/23 1628           Code Status History     Date Active Date Inactive Code Status Order ID Comments User Context   12/09/2021 1652 12/11/2021 1816 Full Code [id number]  Tyrone Nine, MD Inpatient         IV Access:   Peripheral IV   Procedures and diagnostic studies:   No results found.   Medical Consultants:   None.   Subjective:    Christy Poole relates her breathing is  better.  Objective:    Vitals:   09/11/23 2340 09/12/23 0343 09/12/23 0636 09/12/23 0721  BP: (!) 155/79 (!) 150/88  (!) 144/106  Pulse: 85 78  77  Resp: 19 15  14   Temp: 97.9 F (36.6 C) 97.8 F (36.6 C)  97.8  F (36.6 C)  TempSrc: Oral Oral  Oral  SpO2: 92% 94%  93%  Weight:   48.7 kg   Height:       SpO2: 93 % O2 Flow Rate (L/min): 2 L/min FiO2 (%): 94 %   Intake/Output Summary (Last 24 hours) at 09/12/2023 0744 Last data filed at 09/12/2023 0600 Gross per 24 hour  Intake 940 ml  Output --  Net 940 ml   Filed Weights   09/10/23 0425 09/11/23 0303 09/12/23 0636  Weight: 48 kg 48 kg 48.7 kg    Exam: General exam: In no acute distress. Respiratory system: Good air movement and clear to auscultation. Cardiovascular system: S1 & S2 heard, diastolic murmur Gastrointestinal system: Abdomen is nondistended, soft and nontender.  Extremities: No pedal edema. Skin: No rashes, lesions or ulcers Psychiatry: Judgement and insight appear normal. Mood & affect appropriate.  Data Reviewed:    Labs: Basic Metabolic Panel: Recent Labs  Lab 09/06/23 0623 09/07/23 0242 09/07/23 1615 09/07/23 1619 09/07/23 1621 09/08/23 1126 09/11/23 0933 09/12/23 0233  NA 136 133*   < > 141 140 136 132* 135  K 4.4 4.8   < > 4.2 4.0 3.7 3.9 4.6  CL 102 99  --   --   --  98 100 101  CO2 26 27  --   --   --  27 26 26   GLUCOSE 130* 144*  --   --   --  151* 185* 113*  BUN 47* 40*  --   --   --  35* 24* 30*  CREATININE 1.18* 0.76  --   --   --  1.06* 0.99 0.83  CALCIUM 9.8 9.5  --   --   --  10.0 9.3 9.5   < > = values in this interval not displayed.   GFR Estimated Creatinine Clearance: 46.7 mL/min (by C-G formula based on SCr of 0.83 mg/dL). Liver Function Tests: No results for input(s): "AST", "ALT", "ALKPHOS", "BILITOT", "PROT", "ALBUMIN" in the last 168 hours.  No results for input(s): "LIPASE", "AMYLASE" in the last 168 hours. No results for input(s): "AMMONIA" in the last 168 hours. Coagulation profile Recent Labs  Lab 09/08/23 0241 09/09/23 0218 09/10/23 0226 09/11/23 0317 09/12/23 0233  INR 1.3* 1.6* 2.0* 3.0* 2.3*   COVID-19 Labs  No results for input(s): "DDIMER", "FERRITIN",  "LDH", "CRP" in the last 72 hours.  Lab Results  Component Value Date   SARSCOV2NAA NEGATIVE 09/04/2023   SARSCOV2NAA NEGATIVE 12/15/2021   SARSCOV2NAA NEGATIVE 12/09/2021   SARSCOV2NAA NEGATIVE 10/21/2021    CBC: Recent Labs  Lab 09/08/23 0241 09/09/23 0218 09/10/23 0226 09/11/23 0317 09/12/23 0233  WBC 14.2* 13.9* 13.8* 13.9* 11.6*  HGB 13.1 13.9 14.5 13.5 14.1  HCT 39.7 42.3 44.2 41.4 43.3  MCV 86.3 84.8 84.5 84.3 84.7  PLT 230 242 279 253 286   Cardiac Enzymes: No results for input(s): "CKTOTAL", "CKMB", "CKMBINDEX", "TROPONINI" in the last 168 hours. BNP (last 3 results) No results for input(s): "PROBNP" in the last 8760 hours. CBG: Recent Labs  Lab 09/11/23 0613 09/11/23 1152 09/11/23 1608 09/11/23 2141 09/12/23 0622  GLUCAP 105* 105* 294* 148* 96   D-Dimer: No  results for input(s): "DDIMER" in the last 72 hours. Hgb A1c: No results for input(s): "HGBA1C" in the last 72 hours. Lipid Profile: No results for input(s): "CHOL", "HDL", "LDLCALC", "TRIG", "CHOLHDL", "LDLDIRECT" in the last 72 hours. Thyroid function studies: No results for input(s): "TSH", "T4TOTAL", "T3FREE", "THYROIDAB" in the last 72 hours.  Invalid input(s): "FREET3" Anemia work up: No results for input(s): "VITAMINB12", "FOLATE", "FERRITIN", "TIBC", "IRON", "RETICCTPCT" in the last 72 hours. Sepsis Labs: Recent Labs  Lab 09/09/23 0218 09/10/23 0226 09/11/23 0317 09/12/23 0233  WBC 13.9* 13.8* 13.9* 11.6*   Microbiology Recent Results (from the past 240 hour(s))  Resp panel by RT-PCR (RSV, Flu A&B, Covid) Anterior Nasal Swab     Status: None   Collection Time: 09/04/23  1:20 PM   Specimen: Anterior Nasal Swab  Result Value Ref Range Status   SARS Coronavirus 2 by RT PCR NEGATIVE NEGATIVE Final   Influenza A by PCR NEGATIVE NEGATIVE Final   Influenza B by PCR NEGATIVE NEGATIVE Final    Comment: (NOTE) The Xpert Xpress SARS-CoV-2/FLU/RSV plus assay is intended as an aid in the  diagnosis of influenza from Nasopharyngeal swab specimens and should not be used as a sole basis for treatment. Nasal washings and aspirates are unacceptable for Xpert Xpress SARS-CoV-2/FLU/RSV testing.  Fact Sheet for Patients: BloggerCourse.com  Fact Sheet for Healthcare Providers: SeriousBroker.it  This test is not yet approved or cleared by the [place] FDA and has been authorized for detection and/or diagnosis of SARS-CoV-2 by FDA under an Emergency Use Authorization (EUA). This EUA will remain in effect (meaning this test can be used) for the duration of the COVID-19 declaration under Section 564(b)(1) of the Act, 21 U.S.C. section 360bbb-3(b)(1), unless the authorization is terminated or revoked.     Resp Syncytial Virus by PCR NEGATIVE NEGATIVE Final    Comment: (NOTE) Fact Sheet for Patients: BloggerCourse.com  Fact Sheet for Healthcare Providers: SeriousBroker.it  This test is not yet approved or cleared by the [place] FDA and has been authorized for detection and/or diagnosis of SARS-CoV-2 by FDA under an Emergency Use Authorization (EUA). This EUA will remain in effect (meaning this test can be used) for the duration of the COVID-19 declaration under Section 564(b)(1) of the Act, 21 U.S.C. section 360bbb-3(b)(1), unless the authorization is terminated or revoked.  Performed at [facility], [address] [place], [place] [address]   Respiratory (~20 pathogens) panel by PCR     Status: None   Collection Time: 09/04/23  5:04 PM   Specimen: Nasopharyngeal Swab; Respiratory  Result Value Ref Range Status   Adenovirus NOT DETECTED NOT DETECTED Final   Coronavirus 229E NOT DETECTED NOT DETECTED Final    Comment: (NOTE) The Coronavirus on the Respiratory Panel, DOES NOT test for the novel  Coronavirus (2019 nCoV)    Coronavirus HKU1 NOT DETECTED  NOT DETECTED Final   Coronavirus NL63 NOT DETECTED NOT DETECTED Final   Coronavirus OC43 NOT DETECTED NOT DETECTED Final   Metapneumovirus NOT DETECTED NOT DETECTED Final   Rhinovirus / Enterovirus NOT DETECTED NOT DETECTED Final   Influenza A NOT DETECTED NOT DETECTED Final   Influenza B NOT DETECTED NOT DETECTED Final   Parainfluenza Virus 1 NOT DETECTED NOT DETECTED Final   Parainfluenza Virus 2 NOT DETECTED NOT DETECTED Final   Parainfluenza Virus 3 NOT DETECTED NOT DETECTED Final   Parainfluenza Virus 4 NOT DETECTED NOT DETECTED Final   Respiratory Syncytial Virus NOT DETECTED NOT DETECTED Final  Bordetella pertussis NOT DETECTED NOT DETECTED Final   Bordetella Parapertussis NOT DETECTED NOT DETECTED Final   Chlamydophila pneumoniae NOT DETECTED NOT DETECTED Final   Mycoplasma pneumoniae NOT DETECTED NOT DETECTED Final    Comment: Performed at [facility], [address] [place], [place] [address]  MRSA Next Gen by PCR, Nasal     Status: None   Collection Time: 09/05/23  2:22 PM   Specimen: Nasal Mucosa; Nasal Swab  Result Value Ref Range Status   MRSA by PCR Next Gen NOT DETECTED NOT DETECTED Final    Comment: (NOTE) The GeneXpert MRSA Assay (FDA approved for NASAL specimens only), is one component of a comprehensive MRSA colonization surveillance program. It is not intended to diagnose MRSA infection nor to guide or monitor treatment for MRSA infections. Test performance is not FDA approved in patients less than 59 years old. Performed at [facility], [address] [place], [place] [address]   Expectorated Sputum Assessment w Gram Stain, Rflx to Resp Cult     Status: None   Collection Time: 09/08/23 12:54 PM   Specimen: Expectorated Sputum  Result Value Ref Range Status   Specimen Description EXPECTORATED SPUTUM  Final   Special Requests NONE  Final   Sputum evaluation   Final    THIS SPECIMEN IS ACCEPTABLE FOR SPUTUM CULTURE Performed at [facility], [address] [place], [place] [address]    Report Status 09/08/2023 FINAL  Final  Culture, Respiratory w Gram Stain     Status: None   Collection Time: 09/08/23 12:54 PM  Result Value Ref Range Status   Specimen Description EXPECTORATED SPUTUM  Final   Special Requests NONE Reflexed from [id number]  Final   Gram Stain   Final    MODERATE WBC PRESENT, PREDOMINANTLY PMN RARE GRAM POSITIVE COCCI IN PAIRS RARE GRAM POSITIVE RODS    Culture   Final    FEW Normal respiratory flora-no Staph aureus or Pseudomonas seen Performed at [facility], [address], [place], [place] [address]    Report Status 09/11/2023 FINAL  Final     Medications:    arformoterol  15 mcg Nebulization BID   atorvastatin  80 mg Oral Daily   budesonide (PULMICORT) nebulizer solution  0.5 mg Nebulization BID   chlorpheniramine-HYDROcodone  5 mL Oral Q12H   famotidine  20 mg Oral BID   furosemide  40 mg Oral Daily   gabapentin  100 mg Oral QHS   insulin aspart  0-15 Units Subcutaneous TID WC   insulin aspart  0-5 Units Subcutaneous QHS   insulin aspart  4 Units Subcutaneous TID WC   insulin detemir  5 Units Subcutaneous BID   levETIRAcetam  250 mg Oral BID   levothyroxine  50 mcg Oral Q0600   metoprolol succinate  150 mg Oral Daily   predniSONE  20 mg Oral Q breakfast   revefenacin  175 mcg Nebulization Daily   sodium chloride flush  3 mL Intravenous Q12H   spironolactone  12.5 mg Oral Daily   warfarin  4 mg Oral ONCE-1600   Warfarin - Pharmacist Dosing Inpatient   Does not apply q1600   Continuous Infusions:  cefTRIAXone (ROCEPHIN)  IV 2 g (09/11/23 0921)      LOS: 7 days   Marinda Elk  [facility]  09/12/2023, 7:44 AM

## 2023-09-13 ENCOUNTER — Other Ambulatory Visit (HOSPITAL_COMMUNITY): Payer: Self-pay

## 2023-09-13 DIAGNOSIS — I050 Rheumatic mitral stenosis: Secondary | ICD-10-CM | POA: Diagnosis not present

## 2023-09-13 DIAGNOSIS — J189 Pneumonia, unspecified organism: Secondary | ICD-10-CM | POA: Diagnosis not present

## 2023-09-13 DIAGNOSIS — I4821 Permanent atrial fibrillation: Secondary | ICD-10-CM | POA: Diagnosis not present

## 2023-09-13 LAB — CBC
HCT: 45.2 % (ref 36.0–46.0)
Hemoglobin: 15.1 g/dL — ABNORMAL HIGH (ref 12.0–15.0)
MCH: 28.1 pg (ref 26.0–34.0)
MCHC: 33.4 g/dL (ref 30.0–36.0)
MCV: 84 fL (ref 80.0–100.0)
Platelets: 300 10*3/uL (ref 150–400)
RBC: 5.38 MIL/uL — ABNORMAL HIGH (ref 3.87–5.11)
RDW: 13.2 % (ref 11.5–15.5)
WBC: 12 10*3/uL — ABNORMAL HIGH (ref 4.0–10.5)
nRBC: 0 % (ref 0.0–0.2)

## 2023-09-13 LAB — PROTIME-INR
INR: 1.9 — ABNORMAL HIGH (ref 0.8–1.2)
Prothrombin Time: 22 s — ABNORMAL HIGH (ref 11.4–15.2)

## 2023-09-13 LAB — GLUCOSE, CAPILLARY: Glucose-Capillary: 97 mg/dL (ref 70–99)

## 2023-09-13 MED ORDER — CEFDINIR 300 MG PO CAPS
300.0000 mg | ORAL_CAPSULE | Freq: Two times a day (BID) | ORAL | Status: DC
  Administered 2023-09-13: 300 mg via ORAL
  Filled 2023-09-13: qty 1

## 2023-09-13 MED ORDER — FUROSEMIDE 40 MG PO TABS
40.0000 mg | ORAL_TABLET | Freq: Every day | ORAL | 1 refills | Status: DC
  Filled 2023-09-13: qty 30, 30d supply, fill #0

## 2023-09-13 MED ORDER — CEFDINIR 300 MG PO CAPS
300.0000 mg | ORAL_CAPSULE | Freq: Two times a day (BID) | ORAL | 0 refills | Status: AC
  Filled 2023-09-13: qty 10, 5d supply, fill #0

## 2023-09-13 MED ORDER — INFLUENZA VIRUS VACC SPLIT PF (FLUZONE) 0.5 ML IM SUSY: 0.5000 mL | PREFILLED_SYRINGE | INTRAMUSCULAR | Status: DC

## 2023-09-13 MED ORDER — PREDNISONE 10 MG PO TABS
ORAL_TABLET | ORAL | 0 refills | Status: DC
  Filled 2023-09-13: qty 3, 2d supply, fill #0

## 2023-09-13 MED ORDER — SPIRONOLACTONE 25 MG PO TABS
12.5000 mg | ORAL_TABLET | Freq: Every day | ORAL | 1 refills | Status: DC
  Filled 2023-09-13: qty 30, 60d supply, fill #0

## 2023-09-13 MED ORDER — WARFARIN SODIUM 5 MG PO TABS
5.0000 mg | ORAL_TABLET | Freq: Once | ORAL | Status: AC
  Administered 2023-09-13: 5 mg via ORAL
  Filled 2023-09-13: qty 1

## 2023-09-13 MED ORDER — METOPROLOL SUCCINATE ER 50 MG PO TB24
150.0000 mg | ORAL_TABLET | Freq: Every day | ORAL | 1 refills | Status: DC
  Filled 2023-09-13: qty 90, 30d supply, fill #0

## 2023-09-13 NOTE — Discharge Summary (Signed)
Physician Discharge Summary  Christy Poole ONG:[id number] DOB: 18-Aug-1961 DOA: 09/04/2023  PCP: de [place], Raymond J, MD  Admit date: 09/04/2023 Discharge date: 09/13/2023  Admitted From: Home Disposition:  Home  Recommendations for Outpatient Follow-up:  Follow up with PCP in 1-2 weeks Please obtain BMP/CBC in one week Follow-up with CT surgery as an outpatient for severe rheumatic heart disease. Follow-up with cardiology in 2 weeks.   Home Health:No Equipment/Devices:None  Discharge Condition:Stable CODE STATUS:Full Diet recommendation: Heart Healthy   Brief/Interim Summary: 62 y.o. 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  In the ED was tachypneic tachycardic, respiratory panel flu COVID RSV was negative.  Chest x-ray showed interstitial marking and twelve-lead EKG showed A-fib with RVR started on IV Solu-Medrol inhalers and antibiotics cardiology was consulted.  2D echo was done that showed a normally EF flattened interventricular septum during systole consistent with right ventricular overload and mildly reduced RV function with mitral stenosis severe.  Cardiology thinks this is most likely pulmonary driven.  Started on a Z-Pak and Tussidex.  09/08/2023 had worsening sputum production repeated chest x-ray showed no evidence of pulmonary edema worsening infection pulmonary was consulted CT scan of the chest showed multilevel bilateral bronchopneumonia   Discharge Diagnoses:  Principal Problem:   Respiratory distress Active Problems:   Permanent atrial fibrillation (HCC)   Rheumatic mitral stenosis   Diabetes mellitus (HCC)   Hx-TIA (transient ischemic attack)    Dyslipidemia   Hypothyroidism   Cough   Dyspnea on exertion   Precordial chest pain   Renal insufficiency   Supratherapeutic INR   AKI (acute kidney injury) (HCC)  Acute respiratory failure with hypoxia due to bronchopneumonia: CT scan of the chest showed bilateral bronchopneumonia. Initially she required high flow oxygen. Pulmonary was consulted and she was continued on IV Rocephin, they recommended to continue IV Rocephin and taper down steroids. We were able to wean her down to room air. She was transition to oral Omnicef which she will follow-up as an outpatient. Follow-up pulmonary nodule as an outpatient with pulmonary.  Permanent A-fib with RVR in the setting of severe rheumatic valve disease: Initially on Cardizem now transition to oral metoprolol. 2D echo showed preserved EF. Cardiology was consulted goal INR 2-3. Cardiology recommend to continue metoprolol 150 mg, Lasix and Aldactone will follow-up with them in 1 week.  Severe rheumatic valve disease: With a history of balloon valvuloplasty pending surgical evaluation as an outpatient. Has remained stable on Lasix and metoprolol. Follow-up with CT surgery as an outpatient.  Diabetes mellitus type 2: Was on steroids on admission which made her blood glucose erratic, her insulin was titrated. She is coming down on the steroids and her blood glucose improved. No changes made to her home.  History of CVA: Continue aspirin and Coumadin.  Dyslipidemia: Continue statins.  History of complex seizures: Continue Keppra.  Leukocytosis: Likely due to steroids.  Discharge Instructions  Discharge Instructions     Diet - low sodium heart healthy   Complete by: As directed    Increase activity slowly   Complete by: As directed       Allergies as of 09/13/2023   No Known Allergies      Medication List     TAKE  these medications    acetaminophen 500 MG tablet Commonly known as: TYLENOL Take 1,000 mg by mouth  every 6 (six) hours as needed for mild pain, headache or fever.   albuterol 108 (90 Base) MCG/ACT inhaler Commonly known as: VENTOLIN HFA Inhale 1-2 puffs into the lungs every 4 (four) hours as needed for wheezing or shortness of breath.   Aspirin Low Dose 81 MG tablet Generic drug: aspirin EC TAKE 1 TABLET (81 MG TOTAL) BY MOUTH DAILY. (SWALLOW WHOLE)   atorvastatin 80 MG tablet Commonly known as: LIPITOR TAKE 1 TABLET (80 MG TOTAL) BY MOUTH DAILY.   benzonatate 100 MG capsule Commonly known as: TESSALON Take 1 capsule (100 mg total) by mouth 2 (two) times daily as needed for cough.   cefdinir 300 MG capsule Commonly known as: OMNICEF Take 1 capsule (300 mg total) by mouth every 12 (twelve) hours for 5 days.   furosemide 40 MG tablet Commonly known as: LASIX Take 1 tablet (40 mg total) by mouth daily.   gabapentin 100 MG capsule Commonly known as: Neurontin Take 1 capsule (100 mg total) by mouth at bedtime.   levETIRAcetam 250 MG tablet Commonly known as: KEPPRA TAKE 1 TABLET (250 MG TOTAL) BY MOUTH 2 (TWO) TIMES DAILY.   levothyroxine 50 MCG tablet Commonly known as: SYNTHROID TAKE 1 TABLET (50 MCG TOTAL) BY MOUTH AT BEDTIME.   metFORMIN 500 MG 24 hr tablet Commonly known as: GLUCOPHAGE-XR Take 1 tablet (500 mg total) by mouth daily with breakfast.   metoprolol succinate 50 MG 24 hr tablet Commonly known as: TOPROL-XL Take 3 tablets (150 mg total) by mouth daily. What changed: See the new instructions.   mometasone-formoterol 100-5 MCG/ACT Aero Commonly known as: DULERA Inhale 2 puffs into the lungs in the morning and at bedtime.   OptiChamber Diamond-Lg Mask Devi Use as directed.   predniSONE 10 MG tablet Commonly known as: DELTASONE Takes 2 tabs for 1 days, then 1 tab for 1 days, and then stop.   spironolactone 25 MG tablet Commonly known as: ALDACTONE Take 0.5 tablets (12.5 mg total) by mouth daily.   warfarin 2.5 MG tablet Commonly known as:  COUMADIN Take as directed. If you are unsure how to take this medication, talk to your nurse or doctor. Original instructions: TAKE 1-2 TABLETS DAILY OR AS PRESCRIBED BY COUMADIN CLINIC What changed:  how much to take how to take this when to take this additional instructions        No Known Allergies  Consultations: Cardiology Pulmonary    Procedures/Studies: DG Swallowing Func-Speech Pathology  Result Date: 09/12/2023 Table formatting from the original result was not included. Modified Barium Swallow Study Patient Details Name: TRUMAN WAGLE MRN: [id number] Date of Birth: 02/07/1961 Today's Date: 09/12/2023 HPI/PMH: HPI: Patient is a 62 y.o. 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 Chest x-ray with increased interstitial markings favoring edema/CHF but unable to exclude superimposed pneumonitis. SLP swallow evalution ordered on 09/10/23 due to patient observed to have increased coughing when taking medications and after PO's with her breakfast tray. Clinical Impression: Clinical Impression: Patient's overall oropharyngeal swallow is WFL as per this modified barium swallow study. Barium consistencies tested included: thin, nectar thick, puree, solid. No penetration or aspiration or any of the tested barium consistencies observed. Swallow was initiated at level of the pyriform sinus with thin liquids  and at vallecular sinus with nectar thick liquids, puree solids and regular solid. No pharyngeal residuals observed after initial swallow and no retrograde movement of barium observed in pharynx or below PES. Esophageal sweep did not reveal any barium stasis or retrograde movement. 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 SLP is not  recommending any further intervention in regards to patient's swallow function at this time. Recommendations/Plan: Swallowing Evaluation Recommendations Swallowing Evaluation Recommendations Recommendations: PO diet PO Diet Recommendation: Regular; Thin liquids (Level 0) Liquid Administration via: Cup; Straw Medication Administration: Whole meds with liquid Supervision: Patient able to self-feed Postural changes: Position pt fully upright for meals Oral care recommendations: Oral care BID (2x/day) Treatment Plan Treatment Plan Treatment recommendations: No treatment recommended at this time Follow-up recommendations: No SLP follow up Functional status assessment: Patient has not had a recent decline in their functional status. Recommendations Recommendations for follow up therapy are one component of a multi-disciplinary discharge planning process, led by the attending physician.  Recommendations may be updated based on patient status, additional functional criteria and insurance authorization. Assessment: Orofacial Exam: Orofacial Exam Oral Cavity - Dentition: Adequate natural dentition Anatomy: No data recorded Boluses Administered: Boluses Administered Boluses Administered: Puree; Solid; Mildly thick liquids (Level 2, nectar thick); Thin liquids (Level 0)  Oral Impairment Domain: Oral Impairment Domain Lip Closure: No labial escape Tongue control during bolus hold: Not tested Bolus preparation/mastication: Timely and efficient chewing and mashing Bolus transport/lingual motion: Brisk tongue motion Oral residue: Complete oral clearance Location of oral residue : N/A  Pharyngeal Impairment Domain: Pharyngeal Impairment Domain Soft palate elevation: No bolus between soft palate (SP)/pharyngeal wall (PW) Laryngeal elevation: Complete superior movement of thyroid cartilage with complete approximation of arytenoids to epiglottic petiole Anterior hyoid excursion: Partial anterior movement Epiglottic movement: Complete  inversion Laryngeal vestibule closure: Incomplete, narrow column air/contrast in laryngeal vestibule Pharyngeal stripping wave : Present - complete Pharyngeal contraction (A/P view only): N/A Pharyngoesophageal segment opening: Complete distension and complete duration, no obstruction of flow Tongue base retraction: No contrast between tongue base and posterior pharyngeal wall (PPW) Pharyngeal residue: Complete pharyngeal clearance Location of pharyngeal residue: N/A  Esophageal Impairment Domain: Esophageal Impairment Domain Esophageal clearance upright position: Complete clearance, esophageal coating Pill: No data recorded Penetration/Aspiration Scale Score: Penetration/Aspiration Scale Score 1.  Material does not enter airway: Thin liquids (Level 0); Mildly thick liquids (Level 2, nectar thick); Puree; Solid Compensatory Strategies: Compensatory Strategies Compensatory strategies: Yes Straw: Effective Effective Straw: Thin liquid (Level 0)   General Information: Caregiver present: No  Diet Prior to this Study: Regular; Thin liquids (Level 0)   Temperature : Normal   Respiratory Status: WFL   Supplemental O2: None (Room air)   History of Recent Intubation: No  Behavior/Cognition: Alert; Cooperative; Pleasant mood Self-Feeding Abilities: Able to self-feed Baseline vocal quality/speech: Normal No data recorded No data recorded Exam Limitations: No limitations Goal Planning: No data recorded No data recorded No data recorded No data recorded Consulted and agree with results and recommendations: Patient Pain: Pain Assessment Pain Assessment: No/denies pain Faces Pain Scale: 0 Breathing: 0 Negative Vocalization: 0 Facial Expression: 0 Body Language: 0 Consolability: 0 PAINAD Score: 0 End of Session: Start Time:SLP Start Time (ACUTE ONLY): 1030 Stop Time: SLP Stop Time (ACUTE ONLY): 1050 Time Calculation:SLP Time Calculation (min) (ACUTE ONLY): 20 min Charges: SLP Evaluations $ SLP Speech Visit: 1 Visit SLP Evaluations  $MBS Swallow: 1 Procedure $Swallowing Treatment: 1 Procedure SLP visit diagnosis:  SLP Visit Diagnosis: Dysphagia, oropharyngeal phase (R13.12) Past Medical History: Past Medical History: Diagnosis Date  Atrial fibrillation (HCC)   Diabetes mellitus without complication (HCC)   Stroke (HCC)   Thyroid disease  Past Surgical History: Past Surgical History: Procedure Laterality Date  RIGHT/LEFT HEART CATH AND CORONARY ANGIOGRAPHY N/A 09/07/2023  Procedure: RIGHT/LEFT HEART CATH AND CORONARY ANGIOGRAPHY;  Surgeon: Orbie Pyo, MD;  Location: [facility];  Service: Cardiovascular;  Laterality: N/A; Angela Nevin, MA, CCC-SLP Speech Therapy   CT Chest High Resolution  Result Date: 09/10/2023 CLINICAL DATA:  62 year old female with history of weakness and confusion. Suspected interstitial lung disease. EXAM: CT CHEST WITHOUT CONTRAST TECHNIQUE: Multidetector CT imaging of the chest was performed following the standard protocol without intravenous contrast. High resolution imaging of the lungs, as well as inspiratory and expiratory imaging, was performed. RADIATION DOSE REDUCTION: This exam was performed according to the departmental dose-optimization program which includes automated exposure control, adjustment of the mA and/or kV according to patient size and/or use of iterative reconstruction technique. COMPARISON:  Chest CT 06/16/2023. FINDINGS: Cardiovascular: Heart size is enlarged with biatrial dilatation. There is no significant pericardial fluid, thickening or pericardial calcification. There is aortic atherosclerosis, as well as atherosclerosis of the great vessels of the mediastinum and the coronary arteries, including calcified atherosclerotic plaque in the left main, left anterior descending, left circumflex and right coronary arteries. Calcifications of the mitral annulus. Mediastinum/Nodes: Prominent AP window lymph node, similar to the prior examination, measuring 1 cm in short axis  (previously not hypermetabolic on prior PET-CT 12/09/2022), presumably benign. No other pathologically enlarged mediastinal or hilar lymph nodes. Esophagus is unremarkable in appearance. No axillary lymphadenopathy. Lungs/Pleura: High-resolution imaging is limited by considerable patient respiratory motion. With these limitations in mind, there is some patchy ground-glass attenuation interspersed with areas of lucency, likely reflecting areas of air trapping from small airways disease. This is similar to prior studies. In addition, today's study demonstrates extensive thickening of the peribronchovascular interstitium with regional areas of peribronchovascular consolidation, ground-glass attenuation and micro nodularity scattered throughout the lungs bilaterally, overall appearance of which favors bronchopneumonia. Consolidation is most confluent in the inferior segment of the lingula and in the lower lobes of the lungs bilaterally. No generalized areas of septal thickening, traction bronchiectasis or honeycombing are noted. Upper Abdomen: Unremarkable. Musculoskeletal: There are no aggressive appearing lytic or blastic lesions noted in the visualized portions of the skeleton. IMPRESSION: 1. No compelling findings to suggest interstitial lung disease. The overall appearance of the lungs suggests multilobar bilateral bronchopneumonia. 2. There is also evidence of extensive air trapping from small airways disease. 3. Cardiomegaly with biatrial dilatation. 4. Aortic atherosclerosis, in addition to left main and three-vessel coronary artery disease. Please note that although the presence of coronary artery calcium documents the presence of coronary artery disease, the severity of this disease and any potential stenosis cannot be assessed on this non-gated CT examination. Assessment for potential risk factor modification, dietary therapy or pharmacologic therapy may be warranted, if clinically indicated. 5. There are  calcifications of the mitral annulus. Echocardiographic correlation for evaluation of potential valvular dysfunction may be warranted if clinically indicated. Aortic Atherosclerosis (ICD10-I70.0). Electronically Signed   By: Trudie Reed M.D.   On: 09/10/2023 06:39   DG Chest 1 View  Result Date: 09/08/2023 CLINICAL DATA:  Dyspnea. EXAM: CHEST  1 VIEW COMPARISON:  September 04, 2023. FINDINGS: Mild cardiomegaly is noted. No consolidative process is noted. Bony thorax is unremarkable. IMPRESSION: No  active disease. Electronically Signed   By: Lupita Raider M.D.   On: 09/08/2023 09:52   CARDIAC CATHETERIZATION  Result Date: 09/07/2023   Prox LAD to Mid LAD lesion is 5% stenosed. 1.  Minimal nonobstructive coronary artery disease with patent mid LAD stents. 2.  Fick cardiac output of 4.8 L/min and Fick cardiac index of 3.3 L/min/m with the following hemodynamics:  Right atrial pressure mean of 15 mmHg  Right ventricular pressure 46/21 with an end-diastolic pressure of 14 mmHg  PA pressure 47/33 with a mean of 37 mmHg  Wedge pressure mean of 23 mmHg  PVR of 2.9 Woods units  PA pulsatility index of 0.9 consistent with right ventricular dysfunction 3.  LVEDP of 22 mmHg Recommendation: Continue evaluation and treatment of severe recurrent rheumatic mitral stenosis per cardiology rounding team.   ECHOCARDIOGRAM COMPLETE  Result Date: 09/04/2023    ECHOCARDIOGRAM REPORT   Patient Name:   CHELLSEY MALONY Date of Exam: 09/04/2023 Medical Rec #:  [id number]       Height:       59.0 in Accession #:    [id number]      Weight:       110.0 lb Date of Birth:  29-Dec-1960       BSA:          1.431 m Patient Age:    62 years        BP:           126/69 mmHg Patient Gender: F               HR:           118 bpm. Exam Location:  Inpatient Procedure: 2D Echo, Color Doppler and Cardiac Doppler STAT ECHO Indications:     mitral stenosis  History:         Patient has prior history of Echocardiogram examinations, most                   recent 06/20/2022. History of stroke, Arrythmias:Atrial                  Fibrillation, Signs/Symptoms:Dyspnea; Risk Factors:Dyslipidemia                  and Diabetes.  Sonographer:     Delcie Roch RDCS Referring Phys:  [id number] Cecille Po MELVIN Diagnosing Phys: Clearnce Hasten  Sonographer Comments: Image acquisition challenging due to respiratory motion. IMPRESSIONS  1. Left ventricular ejection fraction, by estimation, is 55 to 60%. The left ventricle has normal function. The left ventricle has no regional wall motion abnormalities. indeterminate due to atrial fibrillation. There is the interventricular septum is flattened in systole, consistent with right ventricular pressure overload.  2. Right ventricular systolic function is mildly reduced. The right ventricular size is normal. There is normal pulmonary artery systolic pressure.  3. Left atrial size was severely dilated.  4. Difficult to assess given atrial fibrillation with rapid rate but appears MS is severe by mean gradient ( ) and by PHT (1.2-1.4cm^2). The mitral valve is rheumatic. Trivial mitral valve regurgitation. Severe mitral stenosis. The mean mitral valve gradient is 11.2 mmHg.  5. The aortic valve is tricuspid. Aortic valve regurgitation is not visualized.  6. The inferior vena cava is normal in size with <50% respiratory variability, suggesting right atrial pressure of 8 mmHg. FINDINGS  Left Ventricle: Left ventricular ejection fraction, by estimation, is 55 to 60%. The left ventricle has normal function. The left ventricle  has no regional wall motion abnormalities. The left ventricular internal cavity size was small. There is no left ventricular hypertrophy. The interventricular septum is flattened in systole, consistent with right ventricular pressure overload. Indeterminate due to atrial fibrillation. Right Ventricle: The right ventricular size is normal. No increase in right ventricular wall thickness. Right  ventricular systolic function is mildly reduced. There is normal pulmonary artery systolic pressure. The tricuspid regurgitant velocity is 2.51 m/s, and with an assumed right atrial pressure of 3 mmHg, the estimated right ventricular systolic pressure is 28.2 mmHg. Left Atrium: Left atrial size was severely dilated. Right Atrium: Right atrial size was normal in size. Pericardium: There is no evidence of pericardial effusion. Mitral Valve: Difficult to assess given atrial fibrillation with rapid rate but appears MS is severe by mean gradient ( ) and by PHT (1.2-1.4cm^2). The mitral valve is rheumatic. There is mild thickening of the mitral valve leaflet(s). There is moderate calcification of the posterior mitral valve leaflet(s). Moderately decreased mobility of the mitral valve leaflets. Trivial mitral valve regurgitation. Severe mitral valve stenosis. MV peak gradient, 21.6 mmHg. The mean mitral valve gradient is 11.2 mmHg. Tricuspid Valve: The tricuspid valve is normal in structure. Tricuspid valve regurgitation is mild. Aortic Valve: The aortic valve is tricuspid. Aortic valve regurgitation is not visualized. Pulmonic Valve: The pulmonic valve was grossly normal. Pulmonic valve regurgitation is not visualized. Aorta: The aortic root and ascending aorta are structurally normal, with no evidence of dilitation. Venous: The inferior vena cava is normal in size with less than 50% respiratory variability, suggesting right atrial pressure of 8 mmHg. IAS/Shunts: The interatrial septum was not well visualized.  LEFT VENTRICLE PLAX 2D LVIDd:         3.30 cm LVIDs:         2.30 cm LV PW:         0.80 cm LV IVS:        0.70 cm LVOT diam:     1.55 cm LV SV:         33 LV SV Index:   23 LVOT Area:     1.89 cm  RIGHT VENTRICLE          IVC RV Basal diam:  1.70 cm  IVC diam: 1.90 cm LEFT ATRIUM             Index        RIGHT ATRIUM           Index LA diam:        3.50 cm 2.45 cm/m   RA Area:     11.80 cm LA Vol (A2C):    43.2 ml 30.20 ml/m  RA Volume:   23.20 ml  16.22 ml/m LA Vol (A4C):   38.6 ml 26.98 ml/m LA Biplane Vol: 41.9 ml 29.29 ml/m  AORTIC VALVE LVOT Vmax:   101.23 cm/s LVOT Vmean:  68.533 cm/s LVOT VTI:    0.173 m  AORTA Ao Root diam: 2.40 cm Ao Asc diam:  2.80 cm MITRAL VALVE              TRICUSPID VALVE MV Area VTI:  0.70 cm    TR Peak grad:   25.2 mmHg MV Peak grad: 21.6 mmHg   TR Vmax:        251.00 cm/s MV Mean grad: 11.2 mmHg MV Vmax:      2.32 m/s    SHUNTS MV Vmean:     157.3 cm/s  Systemic VTI:  0.17 m  Systemic Diam: 1.55 cm Clearnce Hasten Electronically signed by Clearnce Hasten Signature Date/Time: 09/04/2023/6:23:56 PM    Final (Updated)    DG Chest 2 View  Result Date: 09/04/2023 CLINICAL DATA:  Chest pain.  Shortness of breath.  Cough. EXAM: CHEST - 2 VIEW COMPARISON:  08/30/2023. FINDINGS: There are increased interstitial markings with bilateral hilar and bibasilar predominance. There is no significant interval change since the recent prior chest radiograph. There is mild peribronchial cuffing. No acute consolidation or lung collapse. Bilateral costophrenic angles are clear. Stable mildly enlarged cardio-mediastinal silhouette. No acute osseous abnormalities. The soft tissues are within normal limits. IMPRESSION: *Increased interstitial markings throughout bilateral lungs with bilateral hilar and bibasilar predominance. Findings are favored to represent mild congestive heart failure/pulmonary edema. However, superimposed pneumonitis overlying the bilateral lower lung zones can not be excluded. Correlate clinically. No acute dense consolidation or lung collapse. Electronically Signed   By: Jules Schick M.D.   On: 09/04/2023 15:47   DG Chest 2 View  Result Date: 08/30/2023 CLINICAL DATA:  Cough for 3 weeks, shortness of breath. EXAM: CHEST - 2 VIEW COMPARISON:  December 08, 2021.  June 16, 2023. FINDINGS: Stable cardiomegaly. Mild central pulmonary vascular  congestion is noted. No definite consolidative process is noted. Bony thorax is unremarkable. IMPRESSION: Stable cardiomegaly with mild central pulmonary vascular congestion. Electronically Signed   By: Lupita Raider M.D.   On: 08/30/2023 14:23   (Echo, Carotid, EGD, Colonoscopy, ERCP)    Subjective: No acute plaints feels great.  Discharge Exam: Vitals:   09/12/23 2255 09/13/23 0719  BP: (!) 123/90 (!) 128/98  Pulse:  70  Resp: 17 19  Temp: 97.9 F (36.6 C) 98 F (36.7 C)  SpO2: 94% 94%   Vitals:   09/12/23 2029 09/12/23 2255 09/13/23 0444 09/13/23 0719  BP:  (!) 123/90  (!) 128/98  Pulse: 83   70  Resp:  17  19  Temp:  97.9 F (36.6 C)  98 F (36.7 C)  TempSrc:  Oral  Oral  SpO2: 92% 94%  94%  Weight:   48.5 kg   Height:        General: Pt is alert, awake, not in acute distress Cardiovascular: RRR, S1/S2 +, no rubs, no gallops Respiratory: CTA bilaterally, no wheezing, no rhonchi Abdominal: Soft, NT, ND, bowel sounds + Extremities: no edema, no cyanosis    The results of significant diagnostics from this hospitalization (including imaging, microbiology, ancillary and laboratory) are listed below for reference.     Microbiology: Recent Results (from the past 240 hour(s))  Resp panel by RT-PCR (RSV, Flu A&B, Covid) Anterior Nasal Swab     Status: None   Collection Time: 09/04/23  1:20 PM   Specimen: Anterior Nasal Swab  Result Value Ref Range Status   SARS Coronavirus 2 by RT PCR NEGATIVE NEGATIVE Final   Influenza A by PCR NEGATIVE NEGATIVE Final   Influenza B by PCR NEGATIVE NEGATIVE Final    Comment: (NOTE) The Xpert Xpress SARS-CoV-2/FLU/RSV plus assay is intended as an aid in the diagnosis of influenza from Nasopharyngeal swab specimens and should not be used as a sole basis for treatment. Nasal washings and aspirates are unacceptable for Xpert Xpress SARS-CoV-2/FLU/RSV testing.  Fact Sheet for  Patients: BloggerCourse.com  Fact Sheet for Healthcare Providers: SeriousBroker.it  This test is not yet approved or cleared by the [place] FDA and has been authorized for detection and/or diagnosis of SARS-CoV-2 by FDA under an Emergency Use Authorization (  EUA). This EUA will remain in effect (meaning this test can be used) for the duration of the COVID-19 declaration under Section 564(b)(1) of the Act, 21 U.S.C. section 360bbb-3(b)(1), unless the authorization is terminated or revoked.     Resp Syncytial Virus by PCR NEGATIVE NEGATIVE Final    Comment: (NOTE) Fact Sheet for Patients: BloggerCourse.com  Fact Sheet for Healthcare Providers: SeriousBroker.it  This test is not yet approved or cleared by the [place] FDA and has been authorized for detection and/or diagnosis of SARS-CoV-2 by FDA under an Emergency Use Authorization (EUA). This EUA will remain in effect (meaning this test can be used) for the duration of the COVID-19 declaration under Section 564(b)(1) of the Act, 21 U.S.C. section 360bbb-3(b)(1), unless the authorization is terminated or revoked.  Performed at [facility], [address] [place], [place] [address]   Respiratory (~20 pathogens) panel by PCR     Status: None   Collection Time: 09/04/23  5:04 PM   Specimen: Nasopharyngeal Swab; Respiratory  Result Value Ref Range Status   Adenovirus NOT DETECTED NOT DETECTED Final   Coronavirus 229E NOT DETECTED NOT DETECTED Final    Comment: (NOTE) The Coronavirus on the Respiratory Panel, DOES NOT test for the novel  Coronavirus (2019 nCoV)    Coronavirus HKU1 NOT DETECTED NOT DETECTED Final   Coronavirus NL63 NOT DETECTED NOT DETECTED Final   Coronavirus OC43 NOT DETECTED NOT DETECTED Final   Metapneumovirus NOT DETECTED NOT DETECTED Final   Rhinovirus / Enterovirus NOT DETECTED NOT  DETECTED Final   Influenza A NOT DETECTED NOT DETECTED Final   Influenza B NOT DETECTED NOT DETECTED Final   Parainfluenza Virus 1 NOT DETECTED NOT DETECTED Final   Parainfluenza Virus 2 NOT DETECTED NOT DETECTED Final   Parainfluenza Virus 3 NOT DETECTED NOT DETECTED Final   Parainfluenza Virus 4 NOT DETECTED NOT DETECTED Final   Respiratory Syncytial Virus NOT DETECTED NOT DETECTED Final   Bordetella pertussis NOT DETECTED NOT DETECTED Final   Bordetella Parapertussis NOT DETECTED NOT DETECTED Final   Chlamydophila pneumoniae NOT DETECTED NOT DETECTED Final   Mycoplasma pneumoniae NOT DETECTED NOT DETECTED Final    Comment: Performed at [facility], [address] [place], [place] [address]  MRSA Next Gen by PCR, Nasal     Status: None   Collection Time: 09/05/23  2:22 PM   Specimen: Nasal Mucosa; Nasal Swab  Result Value Ref Range Status   MRSA by PCR Next Gen NOT DETECTED NOT DETECTED Final    Comment: (NOTE) The GeneXpert MRSA Assay (FDA approved for NASAL specimens only), is one component of a comprehensive MRSA colonization surveillance program. It is not intended to diagnose MRSA infection nor to guide or monitor treatment for MRSA infections. Test performance is not FDA approved in patients less than 36 years old. Performed at [facility], [address], [place], [place] [address]   Expectorated Sputum Assessment w Gram Stain, Rflx to Resp Cult     Status: None   Collection Time: 09/08/23 12:54 PM   Specimen: Expectorated Sputum  Result Value Ref Range Status   Specimen Description EXPECTORATED SPUTUM  Final   Special Requests NONE  Final   Sputum evaluation   Final    THIS SPECIMEN IS ACCEPTABLE FOR SPUTUM CULTURE Performed at [facility], [address], [place], [place] [address]    Report Status 09/08/2023 FINAL  Final  Culture, Respiratory w Gram Stain  Status: None   Collection Time: 09/08/23 12:54 PM  Result Value Ref Range Status    Specimen Description EXPECTORATED SPUTUM  Final   Special Requests NONE Reflexed from [id number]  Final   Gram Stain   Final    MODERATE WBC PRESENT, PREDOMINANTLY PMN RARE GRAM POSITIVE COCCI IN PAIRS RARE GRAM POSITIVE RODS    Culture   Final    FEW Normal respiratory flora-no Staph aureus or Pseudomonas seen Performed at [facility], [address] [place], [place] [address]    Report Status 09/11/2023 FINAL  Final     Labs: BNP (last 3 results) Recent Labs    09/04/23 1330 09/08/23 1126 09/11/23 0317  BNP 283.1* 129.1* 152.7*   Basic Metabolic Panel: Recent Labs  Lab 09/07/23 0242 09/07/23 1615 09/07/23 1619 09/07/23 1621 09/08/23 1126 09/11/23 0933 09/12/23 0233  NA 133*   < > 141 140 136 132* 135  K 4.8   < > 4.2 4.0 3.7 3.9 4.6  CL 99  --   --   --  98 100 101  CO2 27  --   --   --  27 26 26   GLUCOSE 144*  --   --   --  151* 185* 113*  BUN 40*  --   --   --  35* 24* 30*  CREATININE 0.76  --   --   --  1.06* 0.99 0.83  CALCIUM 9.5  --   --   --  10.0 9.3 9.5   < > = values in this interval not displayed.   Liver Function Tests: No results for input(s): "AST", "ALT", "ALKPHOS", "BILITOT", "PROT", "ALBUMIN" in the last 168 hours. No results for input(s): "LIPASE", "AMYLASE" in the last 168 hours. No results for input(s): "AMMONIA" in the last 168 hours. CBC: Recent Labs  Lab 09/09/23 0218 09/10/23 0226 09/11/23 0317 09/12/23 0233 09/13/23 0218  WBC 13.9* 13.8* 13.9* 11.6* 12.0*  HGB 13.9 14.5 13.5 14.1 15.1*  HCT 42.3 44.2 41.4 43.3 45.2  MCV 84.8 84.5 84.3 84.7 84.0  PLT 242 279 253 286 300   Cardiac Enzymes: No results for input(s): "CKTOTAL", "CKMB", "CKMBINDEX", "TROPONINI" in the last 168 hours. BNP: Invalid input(s): "POCBNP" CBG: Recent Labs  Lab 09/12/23 0622 09/12/23 1122 09/12/23 1603 09/12/23 2102 09/13/23 0605  GLUCAP 96 151* 161* 178* 97   D-Dimer No results for input(s): "DDIMER" in the last 72 hours. Hgb A1c Recent  Labs    09/12/23 0233  HGBA1C 7.0*   Lipid Profile No results for input(s): "CHOL", "HDL", "LDLCALC", "TRIG", "CHOLHDL", "LDLDIRECT" in the last 72 hours. Thyroid function studies No results for input(s): "TSH", "T4TOTAL", "T3FREE", "THYROIDAB" in the last 72 hours.  Invalid input(s): "FREET3" Anemia work up No results for input(s): "VITAMINB12", "FOLATE", "FERRITIN", "TIBC", "IRON", "RETICCTPCT" in the last 72 hours. Urinalysis    Component Value Date/Time   COLORURINE YELLOW 09/04/2023 1723   APPEARANCEUR CLEAR 09/04/2023 1723   LABSPEC 1.020 09/04/2023 1723   PHURINE 5.0 09/04/2023 1723   GLUCOSEU NEGATIVE 09/04/2023 1723   HGBUR MODERATE (A) 09/04/2023 1723   BILIRUBINUR NEGATIVE 09/04/2023 1723   KETONESUR NEGATIVE 09/04/2023 1723   PROTEINUR NEGATIVE 09/04/2023 1723   NITRITE NEGATIVE 09/04/2023 1723   LEUKOCYTESUR NEGATIVE 09/04/2023 1723   Sepsis Labs Recent Labs  Lab 09/10/23 0226 09/11/23 0317 09/12/23 0233 09/13/23 0218  WBC 13.8* 13.9* 11.6* 12.0*   Microbiology Recent Results (from the past 240 hour(s))  Resp panel by  RT-PCR (RSV, Flu A&B, Covid) Anterior Nasal Swab     Status: None   Collection Time: 09/04/23  1:20 PM   Specimen: Anterior Nasal Swab  Result Value Ref Range Status   SARS Coronavirus 2 by RT PCR NEGATIVE NEGATIVE Final   Influenza A by PCR NEGATIVE NEGATIVE Final   Influenza B by PCR NEGATIVE NEGATIVE Final    Comment: (NOTE) The Xpert Xpress SARS-CoV-2/FLU/RSV plus assay is intended as an aid in the diagnosis of influenza from Nasopharyngeal swab specimens and should not be used as a sole basis for treatment. Nasal washings and aspirates are unacceptable for Xpert Xpress SARS-CoV-2/FLU/RSV testing.  Fact Sheet for Patients: BloggerCourse.com  Fact Sheet for Healthcare Providers: SeriousBroker.it  This test is not yet approved or cleared by the [place] FDA and has been  authorized for detection and/or diagnosis of SARS-CoV-2 by FDA under an Emergency Use Authorization (EUA). This EUA will remain in effect (meaning this test can be used) for the duration of the COVID-19 declaration under Section 564(b)(1) of the Act, 21 U.S.C. section 360bbb-3(b)(1), unless the authorization is terminated or revoked.     Resp Syncytial Virus by PCR NEGATIVE NEGATIVE Final    Comment: (NOTE) Fact Sheet for Patients: BloggerCourse.com  Fact Sheet for Healthcare Providers: SeriousBroker.it  This test is not yet approved or cleared by the [place] FDA and has been authorized for detection and/or diagnosis of SARS-CoV-2 by FDA under an Emergency Use Authorization (EUA). This EUA will remain in effect (meaning this test can be used) for the duration of the COVID-19 declaration under Section 564(b)(1) of the Act, 21 U.S.C. section 360bbb-3(b)(1), unless the authorization is terminated or revoked.  Performed at [facility], [address] [place], [place] [address]   Respiratory (~20 pathogens) panel by PCR     Status: None   Collection Time: 09/04/23  5:04 PM   Specimen: Nasopharyngeal Swab; Respiratory  Result Value Ref Range Status   Adenovirus NOT DETECTED NOT DETECTED Final   Coronavirus 229E NOT DETECTED NOT DETECTED Final    Comment: (NOTE) The Coronavirus on the Respiratory Panel, DOES NOT test for the novel  Coronavirus (2019 nCoV)    Coronavirus HKU1 NOT DETECTED NOT DETECTED Final   Coronavirus NL63 NOT DETECTED NOT DETECTED Final   Coronavirus OC43 NOT DETECTED NOT DETECTED Final   Metapneumovirus NOT DETECTED NOT DETECTED Final   Rhinovirus / Enterovirus NOT DETECTED NOT DETECTED Final   Influenza A NOT DETECTED NOT DETECTED Final   Influenza B NOT DETECTED NOT DETECTED Final   Parainfluenza Virus 1 NOT DETECTED NOT DETECTED Final   Parainfluenza Virus 2 NOT DETECTED NOT DETECTED Final    Parainfluenza Virus 3 NOT DETECTED NOT DETECTED Final   Parainfluenza Virus 4 NOT DETECTED NOT DETECTED Final   Respiratory Syncytial Virus NOT DETECTED NOT DETECTED Final   Bordetella pertussis NOT DETECTED NOT DETECTED Final   Bordetella Parapertussis NOT DETECTED NOT DETECTED Final   Chlamydophila pneumoniae NOT DETECTED NOT DETECTED Final   Mycoplasma pneumoniae NOT DETECTED NOT DETECTED Final    Comment: Performed at [facility], [address], [place], [place] [address]  MRSA Next Gen by PCR, Nasal     Status: None   Collection Time: 09/05/23  2:22 PM   Specimen: Nasal Mucosa; Nasal Swab  Result Value Ref Range Status   MRSA by PCR Next Gen NOT DETECTED NOT DETECTED Final    Comment: (NOTE) The GeneXpert MRSA Assay (FDA approved  for NASAL specimens only), is one component of a comprehensive MRSA colonization surveillance program. It is not intended to diagnose MRSA infection nor to guide or monitor treatment for MRSA infections. Test performance is not FDA approved in patients less than 27 years old. Performed at [facility], [address], [place], [place] [address]   Expectorated Sputum Assessment w Gram Stain, Rflx to Resp Cult     Status: None   Collection Time: 09/08/23 12:54 PM   Specimen: Expectorated Sputum  Result Value Ref Range Status   Specimen Description EXPECTORATED SPUTUM  Final   Special Requests NONE  Final   Sputum evaluation   Final    THIS SPECIMEN IS ACCEPTABLE FOR SPUTUM CULTURE Performed at [facility], [address] [place], [place] [address]    Report Status 09/08/2023 FINAL  Final  Culture, Respiratory w Gram Stain     Status: None   Collection Time: 09/08/23 12:54 PM  Result Value Ref Range Status   Specimen Description EXPECTORATED SPUTUM  Final   Special Requests NONE Reflexed from [id number]  Final   Gram Stain   Final    MODERATE WBC PRESENT, PREDOMINANTLY PMN RARE GRAM POSITIVE COCCI IN PAIRS RARE GRAM POSITIVE  RODS    Culture   Final    FEW Normal respiratory flora-no Staph aureus or Pseudomonas seen Performed at [facility], [address] [place], [place] [address]    Report Status 09/11/2023 FINAL  Final     Time coordinating discharge: Over 35 minutes  SIGNED:   Marinda Elk, MD  [facility] 09/13/2023, 7:46 AM Pager   If 7PM-7AM, please contact night-coverage [email or web address] Password [email or web address]

## 2023-09-13 NOTE — Progress Notes (Signed)
PHARMACY - ANTICOAGULATION  Pharmacy Consult for warfarin Indication: atrial fibrillation  No Known Allergies  Patient Measurements: Height: 4\' 11"  (149.9 cm) Weight: 48.5 kg (106 lb 14.8 oz) IBW/kg (Calculated) : 43.2  Vital Signs: Temp: 98 F (36.7 C) (11/20 0719) Temp Source: Oral (11/20 0719) BP: 128/98 (11/20 0931) Pulse Rate: 73 (11/20 0931)  Labs: Recent Labs    09/11/23 0317 09/11/23 0933 09/12/23 0233 09/13/23 0218  HGB 13.5  --  14.1 15.1*  HCT 41.4  --  43.3 45.2  PLT 253  --  286 300  LABPROT 31.1*  --  25.8* 22.0*  INR 3.0*  --  2.3* 1.9*  CREATININE  --  0.99 0.83  --     Estimated Creatinine Clearance: 46.7 mL/min (by C-G formula based on SCr of 0.83 mg/dL).   Assessment: 62 y.o. female with g/o Afib, warfarin on hold for cath and INR < 2.5. Was bridged with IV heparin prior to cath. Warfarin now resumed. Enoxaparin bridge completed. Home warfarin dose 3.75mg  daily except 5mg  Mon/Fri.  INR is subtherapeutic today at 1.9    Goal of Therapy:  INR 2.5 - 3.0 Monitor platelets by anticoagulation protocol: Yes   Plan:  Warfarin 5  mg PO x1 today For home: Warfarin 3.75 mg PO Sun,Tues, Wed, Thurs, Sat Warfarin 5 mg Mon, Fri Follow up with anticoag clinic ASAP   Basilio Cairo, PharmD Candidate 09/13/2023 10:09 AM

## 2023-09-13 NOTE — Progress Notes (Addendum)
No availability in [facility] location for >1 mo so arranged f/u next week in [facility] location. Relayed appt info along with commentary on location change on AVS. Also asked nurse to verbally relay to patient. She also already appears to have Coumadin f/u scheduled 11/22, will leave this as is going into the Thanksgiving week.

## 2023-09-13 NOTE — Plan of Care (Signed)
  Problem: Education: Goal: Ability to describe self-care measures that may prevent or decrease complications (Diabetes Survival Skills Education) will improve Outcome: Adequate for Discharge Goal: Individualized Educational Video(s) Outcome: Adequate for Discharge   Problem: Coping: Goal: Ability to adjust to condition or change in health will improve Outcome: Adequate for Discharge   Problem: Fluid Volume: Goal: Ability to maintain a balanced intake and output will improve Outcome: Adequate for Discharge   Problem: Health Behavior/Discharge Planning: Goal: Ability to identify and utilize available resources and services will improve Outcome: Adequate for Discharge Goal: Ability to manage health-related needs will improve Outcome: Adequate for Discharge   Problem: Metabolic: Goal: Ability to maintain appropriate glucose levels will improve Outcome: Adequate for Discharge   Problem: Nutritional: Goal: Maintenance of adequate nutrition will improve Outcome: Adequate for Discharge Goal: Progress toward achieving an optimal weight will improve Outcome: Adequate for Discharge   Problem: Skin Integrity: Goal: Risk for impaired skin integrity will decrease Outcome: Adequate for Discharge   Problem: Tissue Perfusion: Goal: Adequacy of tissue perfusion will improve Outcome: Adequate for Discharge   Problem: Education: Goal: Knowledge of General Education information will improve Description: Including pain rating scale, medication(s)/side effects and non-pharmacologic comfort measures Outcome: Adequate for Discharge   Problem: Health Behavior/Discharge Planning: Goal: Ability to manage health-related needs will improve Outcome: Adequate for Discharge   Problem: Clinical Measurements: Goal: Ability to maintain clinical measurements within normal limits will improve Outcome: Adequate for Discharge Goal: Will remain free from infection Outcome: Adequate for Discharge Goal:  Diagnostic test results will improve Outcome: Adequate for Discharge Goal: Respiratory complications will improve Outcome: Adequate for Discharge Goal: Cardiovascular complication will be avoided Outcome: Adequate for Discharge   Problem: Activity: Goal: Risk for activity intolerance will decrease Outcome: Adequate for Discharge   Problem: Nutrition: Goal: Adequate nutrition will be maintained Outcome: Adequate for Discharge   Problem: Coping: Goal: Level of anxiety will decrease Outcome: Adequate for Discharge   Problem: Elimination: Goal: Will not experience complications related to bowel motility Outcome: Adequate for Discharge Goal: Will not experience complications related to urinary retention Outcome: Adequate for Discharge   Problem: Pain Management: Goal: General experience of comfort will improve Outcome: Adequate for Discharge   Problem: Safety: Goal: Ability to remain free from injury will improve Outcome: Adequate for Discharge   Problem: Skin Integrity: Goal: Risk for impaired skin integrity will decrease Outcome: Adequate for Discharge   Problem: Acute Rehab PT Goals(only PT should resolve) Goal: Patient Will Transfer Sit To/From Stand Outcome: Adequate for Discharge Goal: Pt Will Transfer Bed To Chair/Chair To Bed Outcome: Adequate for Discharge Goal: Pt Will Perform Standing Balance Or Pre-Gait Outcome: Adequate for Discharge Goal: Pt Will Ambulate Outcome: Adequate for Discharge Goal: Pt Will Go Up/Down Stairs Outcome: Adequate for Discharge

## 2023-09-13 NOTE — Progress Notes (Signed)
AVS paperwork gone over with patient/son, TOC meds given, Influenza and pneumonia vaccines given at their request, ivs removed, patient got dressed and wheeled out to sons vehicle.

## 2023-09-13 NOTE — Progress Notes (Signed)
Physical Therapy Treatment Patient Details Name: Christy Poole MRN: [id number] DOB: 05-01-1961 Today's Date: 09/13/2023   History of Present Illness Christy Poole is a 62 y.o. female who presented to ED with cough, SOB, and chest pain.  PMH: hyperlipidemia, hypothyroidism, stroke, diabetes, atrial fibrillation, chronic bronchitis, rheumatoid mitral stenosis    PT Comments  Pt is making good progress towards goals. Currently pt is Mod I with RW for transfers and gait. Pt was able to perform bed mobility at Mod I. Pt was on room air without significant shortness of breathe or fatigue with functional activities. Pt will benefit from assist with stairs at home per home set up. Due to pt current functional status, home set up and available assistance at home recommending skilled physical therapy services 3x/weekly on discharge from acute care hospital setting in order to decrease risk for falls, injury and re-hospitalization to improve balance, strength and overall mobility. Pt tolerated treatment session well.      If plan is discharge home, recommend the following: Help with stairs or ramp for entrance;Assist for transportation;Assistance with cooking/housework     Equipment Recommendations  None recommended by PT       Precautions / Restrictions Precautions Precautions: Fall Precaution Comments: SOB, cough Restrictions Weight Bearing Restrictions: No     Mobility  Bed Mobility Overal bed mobility: Modified Independent Bed Mobility: Supine to Sit     Supine to sit: Modified independent (Device/Increase time)     General bed mobility comments: Mod I, HOB flat, no use of bed rails. Slight increase in time.    Transfers Overall transfer level: Modified independent Equipment used: None, Rolling walker (2 wheels) Transfers: Sit to/from Stand Sit to Stand: Modified independent (Device/Increase time)           General transfer comment: with and without AD from EOB and  recliner, good safety techniques.    Ambulation/Gait Ambulation/Gait assistance: Modified independent (Device/Increase time) Gait Distance (Feet): 200 Feet Assistive device: Rolling walker (2 wheels), None Gait Pattern/deviations: Step-through pattern, Decreased stride length Gait velocity: slightly decreased Gait velocity interpretation: 1.31 - 2.62 ft/sec, indicative of limited community ambulator   General Gait Details: HR remained in low 100's throughout with O2 sats WNL on room air. Short distance without RW at [facility] due to impaired balance, MOd I with RW.   Stairs Stairs:  (Attempted high stepping holding onto bed rail for railing and small single leg squat. Pt demonstrates ability to navigate stairs per home set up at supervision to CGA.)              Balance Overall balance assessment: Mild deficits observed, not formally tested Sitting-balance support: Feet supported, No upper extremity supported Sitting balance-Leahy Scale: Normal     Standing balance support: During functional activity, Single extremity supported, No upper extremity supported Standing balance-Leahy Scale: Fair Standing balance comment: Pt requires assistive device for balance during gait.          Cognition Arousal: Alert Behavior During Therapy: WFL for tasks assessed/performed Overall Cognitive Status: Within Functional Limits for tasks assessed       General Comments: pt able to follow commands, declined interpretor           General Comments General comments (skin integrity, edema, etc.): Pt son present during session for short period of time. Very supportive.      Pertinent Vitals/Pain Pain Assessment Pain Assessment: No/denies pain     PT Goals (current goals can now be found in the care  plan section) Acute Rehab PT Goals Patient Stated Goal: didn't state PT Goal Formulation: With patient Time For Goal Achievement: 09/25/23 Potential to Achieve Goals: Good Progress towards  PT goals: Progressing toward goals    Frequency    Min 1X/week      PT Plan  Continue with current POC       AM-PAC PT "6 Clicks" Mobility   Outcome Measure  Help needed turning from your back to your side while in a flat bed without using bedrails?: None Help needed moving from lying on your back to sitting on the side of a flat bed without using bedrails?: None Help needed moving to and from a bed to a chair (including a wheelchair)?: None Help needed standing up from a chair using your arms (e.g., wheelchair or bedside chair)?: None Help needed to walk in hospital room?: None Help needed climbing 3-5 steps with a railing? : A Little 6 Click Score: 23    End of Session Equipment Utilized During Treatment: Gait belt Activity Tolerance: Patient tolerated treatment well Patient left: in chair;with call bell/phone within reach Nurse Communication: Mobility status PT Visit Diagnosis: Unsteadiness on feet (R26.81)     Time: 5784-6962 PT Time Calculation (min) (ACUTE ONLY): 24 min  Charges:    $Gait Training: 8-22 mins $Therapeutic Activity: 8-22 mins PT General Charges $$ ACUTE PT VISIT: 1 Visit                    Harrel Carina, DPT, CLT  Acute Rehabilitation Services Office: [phone] (Secure chat preferred)    Claudia Desanctis 09/13/2023, 11:04 AM

## 2023-09-13 NOTE — TOC Transition Note (Signed)
Transition of Care [facility]) - CM/SW Discharge Note   Patient Details  Name: Christy Poole MRN: [id number] Date of Birth: July 18, 1961  Transition of Care [facility]) CM/SW Contact:  Harriet Masson, RN Phone Number: 09/13/2023, 9:20 AM   Clinical Narrative:    Patient stable for discharge.   Due to patient's insurance this RNCM is unable to arranged Home health.  Spoke to son, Christy Poole, regarding OP rehab. [name] is agreeable to chose [facility] OP rehab. Referral sent.  Follow up apts on AVS. Son will transport home.   Final next level of care: OP Rehab Barriers to Discharge: Barriers Resolved   Patient Goals and CMS Choice  Return home    Discharge Placement           home              Discharge Plan and Services Additional resources added to the After Visit Summary for                                       Social Determinants of Health (SDOH) Interventions SDOH Screenings   Food Insecurity: No Food Insecurity (09/05/2023)  Recent Concern: Food Insecurity - Food Insecurity Present (08/30/2023)  Housing: Low Risk  (09/05/2023)  Recent Concern: Housing - Medium Risk (08/30/2023)  Transportation Needs: No Transportation Needs (09/05/2023)  Recent Concern: Transportation Needs - Unmet Transportation Needs (08/30/2023)  Utilities: Not At Risk (09/05/2023)  Alcohol Screen: Low Risk  (08/30/2023)  Depression (PHQ2-9): Low Risk  (03/22/2023)  Financial Resource Strain: High Risk (08/30/2023)  Physical Activity: Inactive (08/30/2023)  Social Connections: Socially Isolated (08/30/2023)  Stress: Stress Concern Present (08/30/2023)  Tobacco Use: Low Risk  (09/04/2023)     Readmission Risk Interventions    09/13/2023    9:20 AM  Readmission Risk Prevention Plan  Transportation Screening Complete  PCP or Specialist Appt within 5-7 Days Complete  Home Care Screening Complete  Medication Review (RN CM) Complete

## 2023-09-13 NOTE — Progress Notes (Signed)
Discharge instructions (including medications) discussed with and copy provided to patient/caregiver 

## 2023-09-14 ENCOUNTER — Telehealth: Payer: Self-pay

## 2023-09-14 ENCOUNTER — Encounter (HOSPITAL_BASED_OUTPATIENT_CLINIC_OR_DEPARTMENT_OTHER): Payer: Self-pay | Admitting: Family Medicine

## 2023-09-14 DIAGNOSIS — R0603 Acute respiratory distress: Secondary | ICD-10-CM

## 2023-09-14 NOTE — Transitions of Care (Post Inpatient/ED Visit) (Signed)
09/14/2023  Name: Christy Poole MRN: [id number] DOB: [birth year]  Today's TOC FU Call Status: Today's TOC FU Call Status:: Successful TOC FU Call Completed TOC FU Call Complete Date: 09/14/23 Patient's Name and Date of Birth confirmed.  Transition Care Management Follow-up Telephone Call Date of Discharge: 09/13/23 Discharge Facility: [facility]) Type of Discharge: Inpatient Admission Primary Inpatient Discharge Diagnosis:: respiratory distress and pneumonia How have you been since you were released from the hospital?: Same (reports fever of  98.5. cough and weakness. not eating  and not drinking well.) Any questions or concerns?: Yes Patient Questions/Concerns:: per son and interpreter: why does pateint still have a cough. which vaccines does patient need. Patient Questions/Concerns Addressed: Other: (instructed to talk to PCP about vaccines and cough)  Items Reviewed: Did you receive and understand the discharge instructions provided?: Yes Medications obtained,verified, and reconciled?: Yes (Medications Reviewed) Any new allergies since your discharge?: No Dietary orders reviewed?: Yes Type of Diet Ordered:: avoiding spicey foods Do you have support at home?: Yes People in Home: child(ren), adult Name of Support/Comfort Primary Source: son,  Christy Poole  Medications Reviewed Today: Medications Reviewed Today     Reviewed by Earlie Server, RN (Registered Nurse) on 09/14/23 at 1053  Med List Status: <None>   Medication Order Taking? Sig Documenting Provider Last Dose Status Informant  acetaminophen (TYLENOL) 500 MG tablet [id number] Yes Take 1,000 mg by mouth every 6 (six) hours as needed for mild pain, headache or fever. [provider] Taking Active Child  albuterol (VENTOLIN HFA) 108 (90 Base) MCG/ACT inhaler [id number] Yes Inhale 1-2 puffs into the lungs every 4 (four) hours as needed for wheezing or shortness of breath. Luciano Cutter, MD Taking Active    aspirin EC (ASPIRIN LOW DOSE) 81 MG tablet [id number] Yes TAKE 1 TABLET (81 MG TOTAL) BY MOUTH DAILY. (SWALLOW WHOLE) Jodelle Red, MD Taking Active   atorvastatin (LIPITOR) 80 MG tablet [id number] Yes TAKE 1 TABLET (80 MG TOTAL) BY MOUTH DAILY. Jodelle Red, MD Taking Active   benzonatate (TESSALON) 100 MG capsule [id number] Yes Take 1 capsule (100 mg total) by mouth 2 (two) times daily as needed for cough. de [place], Raymond J, MD Taking Active   cefdinir (OMNICEF) 300 MG capsule [id number] Yes Take 1 capsule (300 mg total) by mouth every 12 (twelve) hours for 5 days. Marinda Elk, MD Taking Active   furosemide (LASIX) 40 MG tablet [id number] Yes Take 1 tablet (40 mg total) by mouth daily. Marinda Elk, MD Taking Active   gabapentin (NEURONTIN) 100 MG capsule [id number] Yes Take 1 capsule (100 mg total) by mouth at bedtime. Micki Riley, MD Taking Active   levETIRAcetam (KEPPRA) 250 MG tablet [id number] Yes TAKE 1 TABLET (250 MG TOTAL) BY MOUTH 2 (TWO) TIMES DAILY. Micki Riley, MD Taking Active   levothyroxine (SYNTHROID) 50 MCG tablet [id number] Yes TAKE 1 TABLET (50 MCG TOTAL) BY MOUTH AT BEDTIME. de [place], Buren Kos, MD Taking Active   metFORMIN (GLUCOPHAGE-XR) 500 MG 24 hr tablet [id number] Yes Take 1 tablet (500 mg total) by mouth daily with breakfast. Alyson Reedy, FNP Taking Active   metoprolol succinate (TOPROL-XL) 50 MG 24 hr tablet [id number] Yes Take 3 tablets (150 mg total) by mouth daily. Marinda Elk, MD Taking Active   mometasone-formoterol [facility]) 100-5 MCG/ACT Sandrea Matte [id number] Yes Inhale 2 puffs into the lungs in the morning and at bedtime. Luciano Cutter, MD Taking Active   predniSONE (DELTASONE) 10 MG  tablet [id number] Yes Takes 2 tabs for 1 days, then 1 tab for 1 days, and then stop. Marinda Elk, MD Taking Active   Spacer/Aero-Holding [address] [facility] [place]) Hardie Pulley [id number]  Use as directed. Luciano Cutter, MD   Active   spironolactone (ALDACTONE) 25 MG tablet [id number] Yes Take 0.5 tablets (12.5 mg total) by mouth daily. Marinda Elk, MD Taking Active   warfarin (COUMADIN) 2.5 MG tablet [id number] Yes TAKE 1-2 TABLETS DAILY OR AS PRESCRIBED BY COUMADIN CLINIC  Patient taking differently: Take 2.5-3.75 mg by mouth See admin instructions. Take 3.75mg  (1.5 tablets) by mouth every Wednesday and Thursday, take 2.5mg  (1 tablet) by mouth daily on all other days.   Jodelle Red, MD Taking Active            Med Note (ROSE, Shelle Iron Sep 14, 2023 10:52 AM) Son states that patient is taking coumadin as prior to the hospital visit.             Home Care and Equipment/Supplies:    Functional Questionnaire: Do you need assistance with bathing/showering or dressing?: Yes (daughter in law assist) Do you need assistance with meal preparation?: Yes (family assist) Do you need assistance with eating?: No Do you have difficulty maintaining continence: Yes (family takes patient to the bathroom) Do you need assistance with getting out of bed/getting out of a chair/moving?: No Do you have difficulty managing or taking your medications?: Yes (family manages medications)  Follow up appointments reviewed: PCP Follow-up appointment confirmed?: Yes Date of PCP follow-up appointment?: 09/18/23 Follow-up Provider: PCP Specialist Hospital Follow-up appointment confirmed?: Yes Date of Specialist follow-up appointment?: 09/15/23 Follow-Up Specialty Provider:: cardiology Do you need transportation to your follow-up appointment?: No Do you understand care options if your condition(s) worsen?: Yes-patient verbalized understanding  SDOH Interventions Today    Flowsheet Row Most Recent Value  SDOH Interventions   Food Insecurity Interventions Intervention Not Indicated  Housing Interventions Other (Comment)  [reports problems with flushing toilets. REF [id number]  completed.]  Transportation  Interventions Intervention Not Indicated  Utilities Interventions Intervention Not Indicated     INTERPRETER USED FOR ENTIRE VISIT. Phone call with son Christy Poole and interpreter Number [phone]. [employer] interpreter.  Does not check CBG at home.  Son reports that patient has been confused since her stroke. Patient was unable to provide her date of birth or address.  Patient provided permission to speak with son who was able to confirm DOB and address..  Reviewed all medications and discharge instructions with son.   Offered TOC 30 day program and son declines.  Referral placed for SDOH needs.  Encouraged son to ensure patient is taking her medications as prescribed and following up with Md as suggested. Confirmed follow up appointments and transportation. Encouraged son to ask questions that he has about vaccines to PCP.   Lonia Chimera, RN, BSN, CEN [employer]- Transition of Care Team.  [facility] [phone]

## 2023-09-15 ENCOUNTER — Ambulatory Visit: Payer: Medicaid Other | Attending: Cardiovascular Disease

## 2023-09-15 DIAGNOSIS — I4821 Permanent atrial fibrillation: Secondary | ICD-10-CM

## 2023-09-15 DIAGNOSIS — Z8673 Personal history of transient ischemic attack (TIA), and cerebral infarction without residual deficits: Secondary | ICD-10-CM | POA: Diagnosis not present

## 2023-09-15 DIAGNOSIS — Z7901 Long term (current) use of anticoagulants: Secondary | ICD-10-CM | POA: Diagnosis not present

## 2023-09-15 LAB — POCT INR: INR: 1.7 — AB (ref 2.0–3.0)

## 2023-09-15 NOTE — Patient Instructions (Signed)
Description   Take 2 tablets today and tomorrow, then resume same dosage of warfarin 1.5 tablet daily excpet for 2 tablets on Mondays and Fridays.  Stay consistent with greens each week (1-2 per week)  Recheck INR in 2 weeks.  Coumadin Clinic [phone] (Goal 2.5-3.0 per Discharge Summary on 12/08/21)

## 2023-09-16 NOTE — Progress Notes (Deleted)
Cardiology Office Note    Date:  09/16/2023  ID:  Christy, Poole December 15, 1960, MRN [id number] PCP:  de [place], Raymond J, MD  Cardiologist:  Jodelle Red, MD  Electrophysiologist:  None   Chief Complaint: ***  History of Present Illness: .    Christy Poole is a 62 y.o. 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  First seen by cardiology service on 12/10/2021, following a TIA on December 08, 2021.  CT and subsequent MRI did not show evidence of new ischemic injury, did show sequelae of a previous large ischemic stroke.  Patient underwent balloon valvuloplasty in [place] in 2016 or 2017 for history of rheumatic mitral stenosis.  Patient was last seen in clinic on 05/25/2023 by Dr. Cristal Deer did.  Patient reported shortness of breath with some chest tightness when climbing stairs or exerting herself, felt to be able angina due to overall decreased perfusion with increased heart rates.  On 09/04/2023 cardiology service was consulted in hospital for increased shortness of breath.  For the two days prior she had progressive coughing, subjective fevers as well as runny nose.  Echo on 09/04/2023 indicated LVEF of 55 to 60%, LV with normal function, no RWMA.  While MR was difficult to assess given atrial fibrillation MS appeared to be severe by mean gradient of 11 mmHg and PHT 1.2 to 1.4 cm.  On 09/07/2023 she underwent LHC which showed minimal nonobstructive coronary artery disease with patent mid LAD stents.  Right heart catheterization consistent with elevated filling pressures, her Lasix was increased.  Found to have bronchopneumonia, chest x-ray on 09/08/2023 showed no evidence of pulmonary edema, worsening infection, pulmonary was consulted, CT scan of the chest showed multilevel bilateral  bronchopneumonia. It was recommended she undergo TEE  for evaluation of MR after improvement in infection.   Today she presents for follow up. She reports that she   Rheumatic mitral stenosis/Pulm HTN: S/p valvuloplasty in [place] in 2016/2017. Echo on 09/04/2023 indicated LVEF of 55 to 60%, LV with normal function, no RWMA.  While MR was difficult to assess given atrial fibrillation MS appeared to be severe by mean gradient of 11 mmHg and PHT 1.2 to 1.4 cm. Right heart catheterization consistent with elevated filling pressures Patient needs outpatinet TEE, pending improvement in her current infection.        Permanent atrial fibrillation: Rate controlled today at   Bronchopneumonia/Hypoxia:   HX of CVA/TIA: On Coumadin  Hx of seizures: On Keppra, followed by neurology.   Labwork independently reviewed:   ROS: .    *** denies chest pain, shortness of breath, lower extremity edema, fatigue, palpitations, melena, hematuria, hemoptysis, diaphoresis, weakness, presyncope, syncope, orthopnea, and PND.  All other systems are reviewed and otherwise negative.  Studies Reviewed: Marland Kitchen    EKG:  EKG is ordered today, personally reviewed, demonstrating ***  CV Studies:  Cardiac Studies & Procedures   CARDIAC CATHETERIZATION  CARDIAC CATHETERIZATION 09/07/2023  Narrative   Prox LAD to Mid LAD lesion is 5% stenosed.  1.  Minimal nonobstructive coronary artery disease with patent mid LAD stents. 2.  Fick cardiac output of 4.8 L/min and Fick cardiac index of 3.3 L/min/m with the following hemodynamics: Right atrial pressure mean of 15 mmHg Right ventricular pressure 46/21 with an end-diastolic pressure of 14 mmHg PA pressure 47/33 with a mean of 37  mmHg Wedge pressure mean of 23 mmHg PVR of 2.9 Woods units PA pulsatility index of 0.9 consistent with right ventricular dysfunction 3.  LVEDP of 22 mmHg  Recommendation: Continue evaluation and treatment of severe recurrent rheumatic  mitral stenosis per cardiology rounding team.  Findings Coronary Findings Diagnostic  Dominance: Right  Left Anterior Descending The vessel exhibits minimal luminal irregularities. Prox LAD to Mid LAD lesion is 5% stenosed. The lesion was previously treated .  Right Coronary Artery The vessel exhibits minimal luminal irregularities.  Intervention  No interventions have been documented.     ECHOCARDIOGRAM  ECHOCARDIOGRAM COMPLETE 09/04/2023  Narrative ECHOCARDIOGRAM REPORT    [name] Date of Exam: 09/04/2023 Medical Rec #:  [id number]       Height:       59.0 in Accession #:    [id number]      Weight:       110.0 lb Date of Birth:  December 04, 1960       BSA:          1.431 m Patient Age:    62 years        BP:           126/69 mmHg Patient Gender: F               HR:           118 bpm. Exam Location:  Inpatient  Procedure: 2D Echo, Color Doppler and Cardiac Doppler  STAT ECHO  Indications:     mitral stenosis  History:         Patient has prior history of Echocardiogram examinations, most recent 06/20/2022. History of stroke, Arrythmias:Atrial Fibrillation, Signs/Symptoms:Dyspnea; Risk Factors:Dyslipidemia and Diabetes.  Sonographer:     Delcie Roch RDCS Referring Phys:  [id number] Cecille Po MELVIN Diagnosing Phys: Clearnce Hasten   Sonographer Comments: Image acquisition challenging due to respiratory motion. IMPRESSIONS   1. Left ventricular ejection fraction, by estimation, is 55 to 60%. The left ventricle has normal function. The left ventricle has no regional wall motion abnormalities. indeterminate due to atrial fibrillation. There is the interventricular septum is flattened in systole, consistent with right ventricular pressure overload. 2. Right ventricular systolic function is mildly reduced. The right ventricular size is normal. There is normal pulmonary artery systolic pressure. 3. Left atrial size was severely dilated. 4.  Difficult to assess given atrial fibrillation with rapid rate but appears MS is severe by mean gradient ( ) and by PHT (1.2-1.4cm^2). The mitral valve is rheumatic. Trivial mitral valve regurgitation. Severe mitral stenosis. The mean mitral valve gradient is 11.2 mmHg. 5. The aortic valve is tricuspid. Aortic valve regurgitation is not visualized. 6. The inferior vena cava is normal in size with <50% respiratory variability, suggesting right atrial pressure of 8 mmHg.  FINDINGS Left Ventricle: Left ventricular ejection fraction, by estimation, is 55 to 60%. The left ventricle has normal function. The left ventricle has no regional wall motion abnormalities. The left ventricular internal cavity size was small. There is no left ventricular hypertrophy. The interventricular septum is flattened in systole, consistent with right ventricular pressure overload. Indeterminate due to atrial fibrillation.  Right Ventricle: The right ventricular size is normal. No increase in right ventricular wall thickness. Right ventricular systolic function is mildly reduced. There is normal pulmonary artery systolic pressure. The tricuspid regurgitant velocity is 2.51 m/s, and with an assumed right atrial pressure of 3 mmHg, the estimated right ventricular systolic pressure is 28.2 mmHg.  Left  Atrium: Left atrial size was severely dilated.  Right Atrium: Right atrial size was normal in size.  Pericardium: There is no evidence of pericardial effusion.  Mitral Valve: Difficult to assess given atrial fibrillation with rapid rate but appears MS is severe by mean gradient ( ) and by PHT (1.2-1.4cm^2). The mitral valve is rheumatic. There is mild thickening of the mitral valve leaflet(s). There is moderate calcification of the posterior mitral valve leaflet(s). Moderately decreased mobility of the mitral valve leaflets. Trivial mitral valve regurgitation. Severe mitral valve stenosis. MV peak gradient, 21.6 mmHg.  The mean mitral valve gradient is 11.2 mmHg.  Tricuspid Valve: The tricuspid valve is normal in structure. Tricuspid valve regurgitation is mild.  Aortic Valve: The aortic valve is tricuspid. Aortic valve regurgitation is not visualized.  Pulmonic Valve: The pulmonic valve was grossly normal. Pulmonic valve regurgitation is not visualized.  Aorta: The aortic root and ascending aorta are structurally normal, with no evidence of dilitation.  Venous: The inferior vena cava is normal in size with less than 50% respiratory variability, suggesting right atrial pressure of 8 mmHg.  IAS/Shunts: The interatrial septum was not well visualized.   LEFT VENTRICLE PLAX 2D LVIDd:         3.30 cm LVIDs:         2.30 cm LV PW:         0.80 cm LV IVS:        0.70 cm LVOT diam:     1.55 cm LV SV:         33 LV SV Index:   23 LVOT Area:     1.89 cm   RIGHT VENTRICLE          IVC RV Basal diam:  1.70 cm  IVC diam: 1.90 cm  LEFT ATRIUM             Index        RIGHT ATRIUM           Index LA diam:        3.50 cm 2.45 cm/m   RA Area:     11.80 cm LA Vol (A2C):   43.2 ml 30.20 ml/m  RA Volume:   23.20 ml  16.22 ml/m LA Vol (A4C):   38.6 ml 26.98 ml/m LA Biplane Vol: 41.9 ml 29.29 ml/m AORTIC VALVE LVOT Vmax:   101.23 cm/s LVOT Vmean:  68.533 cm/s LVOT VTI:    0.173 m  AORTA Ao Root diam: 2.40 cm Ao Asc diam:  2.80 cm  MITRAL VALVE              TRICUSPID VALVE MV Area VTI:  0.70 cm    TR Peak grad:   25.2 mmHg MV Peak grad: 21.6 mmHg   TR Vmax:        251.00 cm/s MV Mean grad: 11.2 mmHg MV Vmax:      2.32 m/s    SHUNTS MV Vmean:     157.3 cm/s  Systemic VTI:  0.17 m Systemic Diam: 1.55 cm  Clearnce Hasten Electronically signed by Clearnce Hasten Signature Date/Time: 09/04/2023/6:23:56 PM    Final (Updated)               Current Reported Medications:.    No outpatient medications have been marked as taking for the 09/19/23 encounter (Appointment) with [name],  NP.    Physical Exam:    VS:  There were no vitals taken for this visit.   Wt Readings from  Last 3 Encounters:  09/13/23 106 lb 14.8 oz (48.5 kg)  07/12/23 111 lb (50.3 kg)  05/25/23 110 lb 6.4 oz (50.1 kg)    GEN: Well nourished, well developed in no acute distress NECK: No JVD; No carotid bruits CARDIAC: ***RRR, no murmurs, rubs, gallops RESPIRATORY:  Clear to auscultation without rales, wheezing or rhonchi  ABDOMEN: Soft, non-tender, non-distended EXTREMITIES:  No edema; No acute deformity   Asessement and Plan:.     ***     Disposition: F/u with ***  Signed, [name], NP

## 2023-09-18 ENCOUNTER — Ambulatory Visit (HOSPITAL_BASED_OUTPATIENT_CLINIC_OR_DEPARTMENT_OTHER)
Admission: RE | Admit: 2023-09-18 | Discharge: 2023-09-18 | Disposition: A | Discharge status: Home / Self Care | Payer: Medicaid Other | Source: Ambulatory Visit | Attending: Family Medicine | Admitting: Family Medicine

## 2023-09-18 ENCOUNTER — Ambulatory Visit (HOSPITAL_BASED_OUTPATIENT_CLINIC_OR_DEPARTMENT_OTHER): Payer: Medicaid Other | Admitting: Family Medicine

## 2023-09-18 ENCOUNTER — Encounter (HOSPITAL_BASED_OUTPATIENT_CLINIC_OR_DEPARTMENT_OTHER): Payer: Self-pay | Admitting: Family Medicine

## 2023-09-18 ENCOUNTER — Encounter (HOSPITAL_BASED_OUTPATIENT_CLINIC_OR_DEPARTMENT_OTHER): Payer: Self-pay | Admitting: *Deleted

## 2023-09-18 ENCOUNTER — Encounter (HOSPITAL_BASED_OUTPATIENT_CLINIC_OR_DEPARTMENT_OTHER): Payer: Self-pay | Admitting: Radiology

## 2023-09-18 DIAGNOSIS — I050 Rheumatic mitral stenosis: Secondary | ICD-10-CM

## 2023-09-18 DIAGNOSIS — R5381 Other malaise: Secondary | ICD-10-CM

## 2023-09-18 DIAGNOSIS — E1122 Type 2 diabetes mellitus with diabetic chronic kidney disease: Secondary | ICD-10-CM

## 2023-09-18 DIAGNOSIS — Z1231 Encounter for screening mammogram for malignant neoplasm of breast: Secondary | ICD-10-CM | POA: Insufficient documentation

## 2023-09-18 DIAGNOSIS — N182 Chronic kidney disease, stage 2 (mild): Secondary | ICD-10-CM | POA: Diagnosis not present

## 2023-09-18 NOTE — Progress Notes (Signed)
    Procedures performed today:    None.  Independent interpretation of notes and tests performed by another provider:   None.  Brief History, Exam, Impression, and Recommendations:    BP 113/61 (BP Location: Left Arm, Patient Position: Sitting, Cuff Size: Normal)   Pulse 93   Temp 97.7 F (36.5 C)   Ht 4\' 11"  (1.499 m)   Wt 107 lb 14.4 oz (48.9 kg)   SpO2 97%   BMI 21.79 kg/m   Screening mammogram for breast cancer -     3D Screening Mammogram, Left and Right; Future  Physical deconditioning Assessment & Plan: She has concerns with feelings of fatigue and weakness.  She was discharged from the hospital few days ago and was admitted for over 1 week.  She did receive physical therapy while in the hospital and recommendation from inpatient physical therapy services were for continued PT due to her weakness and deconditioning.  Discussed with patient and son that current symptoms are most likely related to recent hospitalization and deconditioning which occurred during her stay in the hospital.  As a result it would be expected for it to take some time before noticing returned to prior level of activity and strength.  Unfortunately, this is likely to be further complicated by planned upcoming procedure interventions with cardiology.  Given symptoms we can check labs to exclude alternative causes of symptoms.  Ultimately, would recommend continuing to work with physical therapy.  Son believes that this was arranged through hospital prior to discharge and we will look further into this.  Advised to let us know if referral is needed and we can assist with arranging for physical therapy services  Orders: -     CBC with Differential/Platelet -     Basic metabolic panel  Type 2 diabetes mellitus with stage 2 chronic kidney disease, without long-term current use of insulin (HCC) -     CBC with Differential/Platelet -     Basic metabolic panel  Rheumatic mitral stenosis Assessment &  Plan: Has had evaluation with cardiology related to this with plan for procedure upcoming.  Recommend continued close follow-up with cardiology and proceeding with recommended procedure.   Return in about 4 weeks (around 10/16/2023) for diabetes.  Spent 36 minutes on this patient encounter, including preparation, chart review, face-to-face counseling with patient and coordination of care, and documentation of encounter   ___________________________________________ Brittany Osier de [place], MD, ABFM, [facility]

## 2023-09-19 ENCOUNTER — Ambulatory Visit: Payer: Medicaid Other | Admitting: Cardiology

## 2023-09-19 DIAGNOSIS — I4821 Permanent atrial fibrillation: Secondary | ICD-10-CM

## 2023-09-19 DIAGNOSIS — Z8673 Personal history of transient ischemic attack (TIA), and cerebral infarction without residual deficits: Secondary | ICD-10-CM

## 2023-09-19 DIAGNOSIS — I050 Rheumatic mitral stenosis: Secondary | ICD-10-CM

## 2023-09-27 NOTE — H&P (View-Only) (Signed)
Cardiology Office Note    Date:  09/29/2023  ID:  Christy Poole, DOB 12-02-1960, MRN [id number] PCP:  de [place], Raymond J, MD  Cardiologist:  Jodelle Red, MD  Electrophysiologist:  None   Chief Complaint: Hospital follow up   History of Present Illness: .    Christy Poole is a 62 y.o. 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  First seen by cardiology service on 12/10/2021, following a TIA on December 08, 2021.  CT and subsequent MRI did not show evidence of new ischemic injury, did show sequelae of a previous large ischemic stroke.  Patient underwent balloon valvuloplasty in [place] in 2016 or 2017 for history of rheumatic mitral stenosis.  Patient was last seen in clinic on 05/25/2023 by Dr. Cristal Deer.  Patient reported shortness of breath with some chest tightness when climbing stairs or exerting herself, not felt to be able angina due to overall decreased perfusion with increased heart rates.  On 09/04/2023 cardiology service was consulted in hospital for increased shortness of breath.  For the two days prior she had progressive coughing, subjective fevers as well as runny nose.  Echo on 09/04/2023 indicated LVEF of 55 to 60%, LV with normal function, no RWMA.  While MR was difficult to assess given atrial fibrillation MS appeared to be severe by mean gradient of 11 mmHg and PHT 1.2 to 1.4 cm.  On 09/07/2023 she underwent LHC which showed minimal nonobstructive coronary artery disease with patent mid LAD stents.  Right heart catheterization consistent with elevated filling pressures, her Lasix was increased.  Found to have bronchopneumonia, chest x-ray on 09/08/2023 showed no evidence of pulmonary edema, worsening infection, pulmonary was consulted, CT scan of the chest  showed multilevel bilateral bronchopneumonia. It was recommended she undergo TEE  for evaluation of MR after improvement in infection.   Today she presents for follow up. She reports that she is doing well, she feels that she is almost fully recovered from her recent bronchopneumonia.  She reports that her breathing has improved and feels that it is back to her normal baseline.  She does endorse some occasional chest discomfort associated with increased shortness of breath when climbing stairs.  She presents today with her son to discuss TEE, interpreter via [employer] was present for the entire visit.  Labwork independently reviewed: 09/13/2023: Hemoglobin 15.1, hematocrit 45.2 09/12/2023: Sodium 135, potassium 4.6, creatinine 0.83 ROS: .   Today she denies lower extremity edema, fatigue, palpitations, melena, hematuria, hemoptysis, diaphoresis, weakness, presyncope, syncope, orthopnea, and PND.  All other systems are reviewed and otherwise negative. Studies Reviewed: Marland Kitchen    EKG:  EKG is ordered today, personally reviewed, demonstrating  EKG Interpretation Date/Time:  Friday September 29 2023 09:38:31 EST Ventricular Rate:  63 PR Interval:    QRS Duration:  68 QT Interval:  390 QTC Calculation: 399 R Axis:   86  Text Interpretation: Atrial fibrillation Low voltage QRS Nonspecific ST and T wave abnormality Confirmed by Reather Littler [id number]) on 09/29/2023 12:55:12 PM   CV Studies:  Cardiac Studies & Procedures   CARDIAC CATHETERIZATION  CARDIAC CATHETERIZATION 09/07/2023  Narrative   Prox LAD to Mid LAD lesion is 5% stenosed.  1.  Minimal nonobstructive coronary artery disease with patent mid LAD stents. 2.  Fick cardiac output of 4.8 L/min and Fick cardiac index of  3.3 L/min/m with the following hemodynamics: Right atrial pressure mean of 15 mmHg Right ventricular pressure 46/21 with an end-diastolic pressure of 14 mmHg PA pressure 47/33 with a mean of 37 mmHg Wedge pressure mean of 23  mmHg PVR of 2.9 Woods units PA pulsatility index of 0.9 consistent with right ventricular dysfunction 3.  LVEDP of 22 mmHg  Recommendation: Continue evaluation and treatment of severe recurrent rheumatic mitral stenosis per cardiology rounding team.  Findings Coronary Findings Diagnostic  Dominance: Right  Left Anterior Descending The vessel exhibits minimal luminal irregularities. Prox LAD to Mid LAD lesion is 5% stenosed. The lesion was previously treated .  Right Coronary Artery The vessel exhibits minimal luminal irregularities.  Intervention  No interventions have been documented.     ECHOCARDIOGRAM  ECHOCARDIOGRAM COMPLETE 09/04/2023  Narrative ECHOCARDIOGRAM REPORT    [name] Date of Exam: 09/04/2023 Medical Rec #:  [id number]       Height:       59.0 in Accession #:    [id number]      Weight:       110.0 lb Date of Birth:  January 03, 1961       BSA:          1.431 m Patient Age:    62 years        BP:           126/69 mmHg Patient Gender: F               HR:           118 bpm. Exam Location:  Inpatient  Procedure: 2D Echo, Color Doppler and Cardiac Doppler  STAT ECHO  Indications:     mitral stenosis  History:         Patient has prior history of Echocardiogram examinations, most recent 06/20/2022. History of stroke, Arrythmias:Atrial Fibrillation, Signs/Symptoms:Dyspnea; Risk Factors:Dyslipidemia and Diabetes.  Sonographer:     Delcie Roch RDCS Referring Phys:  [id number] Cecille Po MELVIN Diagnosing Phys: Clearnce Hasten   Sonographer Comments: Image acquisition challenging due to respiratory motion. IMPRESSIONS   1. Left ventricular ejection fraction, by estimation, is 55 to 60%. The left ventricle has normal function. The left ventricle has no regional wall motion abnormalities. indeterminate due to atrial fibrillation. There is the interventricular septum is flattened in systole, consistent with right ventricular pressure  overload. 2. Right ventricular systolic function is mildly reduced. The right ventricular size is normal. There is normal pulmonary artery systolic pressure. 3. Left atrial size was severely dilated. 4. Difficult to assess given atrial fibrillation with rapid rate but appears MS is severe by mean gradient ( ) and by PHT (1.2-1.4cm^2). The mitral valve is rheumatic. Trivial mitral valve regurgitation. Severe mitral stenosis. The mean mitral valve gradient is 11.2 mmHg. 5. The aortic valve is tricuspid. Aortic valve regurgitation is not visualized. 6. The inferior vena cava is normal in size with <50% respiratory variability, suggesting right atrial pressure of 8 mmHg.  FINDINGS Left Ventricle: Left ventricular ejection fraction, by estimation, is 55 to 60%. The left ventricle has normal function. The left ventricle has no regional wall motion abnormalities. The left ventricular internal cavity size was small. There is no left ventricular hypertrophy. The interventricular septum is flattened in systole, consistent with right ventricular pressure overload. Indeterminate due to atrial fibrillation.  Right Ventricle: The right ventricular size is normal. No increase in right ventricular wall thickness. Right ventricular systolic function is mildly reduced. There is normal  pulmonary artery systolic pressure. The tricuspid regurgitant velocity is 2.51 m/s, and with an assumed right atrial pressure of 3 mmHg, the estimated right ventricular systolic pressure is 28.2 mmHg.  Left Atrium: Left atrial size was severely dilated.  Right Atrium: Right atrial size was normal in size.  Pericardium: There is no evidence of pericardial effusion.  Mitral Valve: Difficult to assess given atrial fibrillation with rapid rate but appears MS is severe by mean gradient ( ) and by PHT (1.2-1.4cm^2). The mitral valve is rheumatic. There is mild thickening of the mitral valve leaflet(s). There is moderate  calcification of the posterior mitral valve leaflet(s). Moderately decreased mobility of the mitral valve leaflets. Trivial mitral valve regurgitation. Severe mitral valve stenosis. MV peak gradient, 21.6 mmHg. The mean mitral valve gradient is 11.2 mmHg.  Tricuspid Valve: The tricuspid valve is normal in structure. Tricuspid valve regurgitation is mild.  Aortic Valve: The aortic valve is tricuspid. Aortic valve regurgitation is not visualized.  Pulmonic Valve: The pulmonic valve was grossly normal. Pulmonic valve regurgitation is not visualized.  Aorta: The aortic root and ascending aorta are structurally normal, with no evidence of dilitation.  Venous: The inferior vena cava is normal in size with less than 50% respiratory variability, suggesting right atrial pressure of 8 mmHg.  IAS/Shunts: The interatrial septum was not well visualized.   LEFT VENTRICLE PLAX 2D LVIDd:         3.30 cm LVIDs:         2.30 cm LV PW:         0.80 cm LV IVS:        0.70 cm LVOT diam:     1.55 cm LV SV:         33 LV SV Index:   23 LVOT Area:     1.89 cm   RIGHT VENTRICLE          IVC RV Basal diam:  1.70 cm  IVC diam: 1.90 cm  LEFT ATRIUM             Index        RIGHT ATRIUM           Index LA diam:        3.50 cm 2.45 cm/m   RA Area:     11.80 cm LA Vol (A2C):   43.2 ml 30.20 ml/m  RA Volume:   23.20 ml  16.22 ml/m LA Vol (A4C):   38.6 ml 26.98 ml/m LA Biplane Vol: 41.9 ml 29.29 ml/m AORTIC VALVE LVOT Vmax:   101.23 cm/s LVOT Vmean:  68.533 cm/s LVOT VTI:    0.173 m  AORTA Ao Root diam: 2.40 cm Ao Asc diam:  2.80 cm  MITRAL VALVE              TRICUSPID VALVE MV Area VTI:  0.70 cm    TR Peak grad:   25.2 mmHg MV Peak grad: 21.6 mmHg   TR Vmax:        251.00 cm/s MV Mean grad: 11.2 mmHg MV Vmax:      2.32 m/s    SHUNTS MV Vmean:     157.3 cm/s  Systemic VTI:  0.17 m Systemic Diam: 1.55 cm  Clearnce Hasten Electronically signed by Clearnce Hasten Signature Date/Time:  09/04/2023/6:23:56 PM    Final (Updated)               Current Reported Medications:.    Current Meds  Medication Sig   acetaminophen (TYLENOL)  500 MG tablet Take 500 mg by mouth every 6 (six) hours as needed for mild pain (pain score 1-3), headache or fever.   aspirin EC (ASPIRIN LOW DOSE) 81 MG tablet TAKE 1 TABLET (81 MG TOTAL) BY MOUTH DAILY. (SWALLOW WHOLE)   atorvastatin (LIPITOR) 80 MG tablet TAKE 1 TABLET (80 MG TOTAL) BY MOUTH DAILY.   benzonatate (TESSALON) 100 MG capsule Take 1 capsule (100 mg total) by mouth 2 (two) times daily as needed for cough.   furosemide (LASIX) 40 MG tablet Take 1 tablet (40 mg total) by mouth daily.   gabapentin (NEURONTIN) 100 MG capsule Take 1 capsule (100 mg total) by mouth at bedtime.   levETIRAcetam (KEPPRA) 250 MG tablet TAKE 1 TABLET (250 MG TOTAL) BY MOUTH 2 (TWO) TIMES DAILY.   levothyroxine (SYNTHROID) 50 MCG tablet TAKE 1 TABLET (50 MCG TOTAL) BY MOUTH AT BEDTIME.   metFORMIN (GLUCOPHAGE-XR) 500 MG 24 hr tablet Take 1 tablet (500 mg total) by mouth daily with breakfast.   metoprolol succinate (TOPROL-XL) 50 MG 24 hr tablet Take 3 tablets (150 mg total) by mouth daily.   mometasone-formoterol (DULERA) 100-5 MCG/ACT AERO Inhale 2 puffs into the lungs in the morning and at bedtime.   spironolactone (ALDACTONE) 25 MG tablet Take 0.5 tablets (12.5 mg total) by mouth daily.   warfarin (COUMADIN) 2.5 MG tablet TAKE 1-2 TABLETS DAILY OR AS PRESCRIBED BY COUMADIN CLINIC (Patient taking differently: Take 2.5-3.75 mg by mouth See admin instructions. Take 3.75mg  (1.5 tablets) by mouth every Wednesday and Thursday, take 2.5mg  (1 tablet) by mouth daily on all other days.)   Physical Exam:    VS:  BP 112/70   Pulse 63   Ht 4\' 11"  (1.499 m)   Wt 107 lb 12.8 oz (48.9 kg)   SpO2 94%   BMI 21.77 kg/m    Wt Readings from Last 3 Encounters:  09/29/23 107 lb 12.8 oz (48.9 kg)  09/18/23 107 lb 14.4 oz (48.9 kg)  09/13/23 106 lb 14.8 oz (48.5 kg)     GEN: Well nourished, well developed in no acute distress NECK: No JVD; No carotid bruits CARDIAC: irregular RR, no murmurs, rubs, gallops RESPIRATORY:  Clear to auscultation without rales, wheezing or rhonchi  ABDOMEN: Soft, non-tender, non-distended EXTREMITIES:  No edema; No acute deformity   Asessement and Plan:.    Rheumatic mitral stenosis/Pulm HTN: S/p valvuloplasty in [place] in 2016/2017. Echo on 09/04/2023 indicated LVEF of 55 to 60%, LV with normal function, no RWMA.  While MR was difficult to assess given atrial fibrillation MS appeared to be severe by mean gradient of 11 mmHg and PHT 1.2 to 1.4 cm. Right heart catheterization consistent with elevated filling pressures.  It was recommended that she undergo outpatient TEE pending improvement in her bronchopneumonia.  Today she presents for follow-up with her son, she reports improvement in her breathing and feels that she is back close to her baseline.  They are anxious to undergo TEE to know if she needs surgery.  Discussed TEE procedure and need for further evaluation of mitral valve.  Patient and son agreed to proceed with TEE, see consent below, she will hold metformin day of procedure.  Pending results of TEE will plan for referral to CT surgery.  Check CBC and BMET, patient has INR check with Coumadin clinic scheduled for 10/02/2023. TEE scheduled for 10/03/23 with Dr. Chilton Si.  Informed Consent   Shared Decision Making/Informed Consent   The risks [esophageal damage, perforation (1:10,000  risk), bleeding, pharyngeal hematoma as well as other potential complications associated with conscious sedation including aspiration, arrhythmia, respiratory failure and death], benefits (treatment guidance and diagnostic support) and alternatives of a transesophageal echocardiogram were discussed in detail with Christy Poole and she is willing to proceed.       Permanent atrial fibrillation: EKG today indicates atrial fibrillation at 63  bpm, rate is well-controlled. CHA2DS2-VASc Score = 6 [CHF History: 0, HTN History: 1, Diabetes History: 1, Stroke History: 2, Vascular Disease History: 1, Age Score: 0, Gender Score: 1].  Therefore, the patient's annual risk of stroke is 9.7 %.  She is on Coumadin coumadin, followed by coumadin clinic.  She denies any significant bleeding problems.  She will have check of her INR on 10/02/2023.  CAD: Cardiac catheterization on 09/07/2023 indicated minimal nonobstructive coronary artery disease with patent mid LAD stents.  Patient notes occasional chest discomfort with shortness of breath when her heart rate increases such as with climbing the stairs, notes this has been ongoing and is unchanged.  Reviewed ED precautions.  To undergo TEE for further evaluation of MR as noted above.  Continue aspirin, Lasix, metoprolol succinate, spironolactone and Coumadin.  Bronchopneumonia/Hypoxia: Patient reports significant improvement in her breathing, she is no longer requiring oxygen.  Her lung sounds have improved, there is no crackles or wheezing.  HX of CVA/TIA: On Coumadin and aspirin, followed by Coumadin clinic.  Hx of seizures: On Keppra, followed by neurology.  She denies any recent seizures.  Language barrier: Interpretor services via [employer] was available throughout visit,  interpretor name Hetal, ID [id number].     Disposition: F/u with Dr. Cristal Deer at next available appointment, APP pending results of TEE.   Signed, [name], NP

## 2023-09-27 NOTE — Progress Notes (Signed)
Cardiology Office Note    Date:  09/29/2023  ID:  Christy Poole, DOB 12-02-1960, MRN [id number] PCP:  de [place], Raymond J, MD  Cardiologist:  Jodelle Red, MD  Electrophysiologist:  None   Chief Complaint: Hospital follow up   History of Present Illness: .    Christy Poole is a 62 y.o. 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  First seen by cardiology service on 12/10/2021, following a TIA on December 08, 2021.  CT and subsequent MRI did not show evidence of new ischemic injury, did show sequelae of a previous large ischemic stroke.  Patient underwent balloon valvuloplasty in [place] in 2016 or 2017 for history of rheumatic mitral stenosis.  Patient was last seen in clinic on 05/25/2023 by Dr. Cristal Deer.  Patient reported shortness of breath with some chest tightness when climbing stairs or exerting herself, not felt to be able angina due to overall decreased perfusion with increased heart rates.  On 09/04/2023 cardiology service was consulted in hospital for increased shortness of breath.  For the two days prior she had progressive coughing, subjective fevers as well as runny nose.  Echo on 09/04/2023 indicated LVEF of 55 to 60%, LV with normal function, no RWMA.  While MR was difficult to assess given atrial fibrillation MS appeared to be severe by mean gradient of 11 mmHg and PHT 1.2 to 1.4 cm.  On 09/07/2023 she underwent LHC which showed minimal nonobstructive coronary artery disease with patent mid LAD stents.  Right heart catheterization consistent with elevated filling pressures, her Lasix was increased.  Found to have bronchopneumonia, chest x-ray on 09/08/2023 showed no evidence of pulmonary edema, worsening infection, pulmonary was consulted, CT scan of the chest  showed multilevel bilateral bronchopneumonia. It was recommended she undergo TEE  for evaluation of MR after improvement in infection.   Today she presents for follow up. She reports that she is doing well, she feels that she is almost fully recovered from her recent bronchopneumonia.  She reports that her breathing has improved and feels that it is back to her normal baseline.  She does endorse some occasional chest discomfort associated with increased shortness of breath when climbing stairs.  She presents today with her son to discuss TEE, interpreter via [employer] was present for the entire visit.  Labwork independently reviewed: 09/13/2023: Hemoglobin 15.1, hematocrit 45.2 09/12/2023: Sodium 135, potassium 4.6, creatinine 0.83 ROS: .   Today she denies lower extremity edema, fatigue, palpitations, melena, hematuria, hemoptysis, diaphoresis, weakness, presyncope, syncope, orthopnea, and PND.  All other systems are reviewed and otherwise negative. Studies Reviewed: Marland Kitchen    EKG:  EKG is ordered today, personally reviewed, demonstrating  EKG Interpretation Date/Time:  Friday September 29 2023 09:38:31 EST Ventricular Rate:  63 PR Interval:    QRS Duration:  68 QT Interval:  390 QTC Calculation: 399 R Axis:   86  Text Interpretation: Atrial fibrillation Low voltage QRS Nonspecific ST and T wave abnormality Confirmed by Reather Littler [id number]) on 09/29/2023 12:55:12 PM   CV Studies:  Cardiac Studies & Procedures   CARDIAC CATHETERIZATION  CARDIAC CATHETERIZATION 09/07/2023  Narrative   Prox LAD to Mid LAD lesion is 5% stenosed.  1.  Minimal nonobstructive coronary artery disease with patent mid LAD stents. 2.  Fick cardiac output of 4.8 L/min and Fick cardiac index of  3.3 L/min/m with the following hemodynamics: Right atrial pressure mean of 15 mmHg Right ventricular pressure 46/21 with an end-diastolic pressure of 14 mmHg PA pressure 47/33 with a mean of 37 mmHg Wedge pressure mean of 23  mmHg PVR of 2.9 Woods units PA pulsatility index of 0.9 consistent with right ventricular dysfunction 3.  LVEDP of 22 mmHg  Recommendation: Continue evaluation and treatment of severe recurrent rheumatic mitral stenosis per cardiology rounding team.  Findings Coronary Findings Diagnostic  Dominance: Right  Left Anterior Descending The vessel exhibits minimal luminal irregularities. Prox LAD to Mid LAD lesion is 5% stenosed. The lesion was previously treated .  Right Coronary Artery The vessel exhibits minimal luminal irregularities.  Intervention  No interventions have been documented.     ECHOCARDIOGRAM  ECHOCARDIOGRAM COMPLETE 09/04/2023  Narrative ECHOCARDIOGRAM REPORT    [name] Date of Exam: 09/04/2023 Medical Rec #:  [id number]       Height:       59.0 in Accession #:    [id number]      Weight:       110.0 lb Date of Birth:  January 03, 1961       BSA:          1.431 m Patient Age:    62 years        BP:           126/69 mmHg Patient Gender: F               HR:           118 bpm. Exam Location:  Inpatient  Procedure: 2D Echo, Color Doppler and Cardiac Doppler  STAT ECHO  Indications:     mitral stenosis  History:         Patient has prior history of Echocardiogram examinations, most recent 06/20/2022. History of stroke, Arrythmias:Atrial Fibrillation, Signs/Symptoms:Dyspnea; Risk Factors:Dyslipidemia and Diabetes.  Sonographer:     Delcie Roch RDCS Referring Phys:  [id number] Cecille Po MELVIN Diagnosing Phys: Clearnce Hasten   Sonographer Comments: Image acquisition challenging due to respiratory motion. IMPRESSIONS   1. Left ventricular ejection fraction, by estimation, is 55 to 60%. The left ventricle has normal function. The left ventricle has no regional wall motion abnormalities. indeterminate due to atrial fibrillation. There is the interventricular septum is flattened in systole, consistent with right ventricular pressure  overload. 2. Right ventricular systolic function is mildly reduced. The right ventricular size is normal. There is normal pulmonary artery systolic pressure. 3. Left atrial size was severely dilated. 4. Difficult to assess given atrial fibrillation with rapid rate but appears MS is severe by mean gradient ( ) and by PHT (1.2-1.4cm^2). The mitral valve is rheumatic. Trivial mitral valve regurgitation. Severe mitral stenosis. The mean mitral valve gradient is 11.2 mmHg. 5. The aortic valve is tricuspid. Aortic valve regurgitation is not visualized. 6. The inferior vena cava is normal in size with <50% respiratory variability, suggesting right atrial pressure of 8 mmHg.  FINDINGS Left Ventricle: Left ventricular ejection fraction, by estimation, is 55 to 60%. The left ventricle has normal function. The left ventricle has no regional wall motion abnormalities. The left ventricular internal cavity size was small. There is no left ventricular hypertrophy. The interventricular septum is flattened in systole, consistent with right ventricular pressure overload. Indeterminate due to atrial fibrillation.  Right Ventricle: The right ventricular size is normal. No increase in right ventricular wall thickness. Right ventricular systolic function is mildly reduced. There is normal  pulmonary artery systolic pressure. The tricuspid regurgitant velocity is 2.51 m/s, and with an assumed right atrial pressure of 3 mmHg, the estimated right ventricular systolic pressure is 28.2 mmHg.  Left Atrium: Left atrial size was severely dilated.  Right Atrium: Right atrial size was normal in size.  Pericardium: There is no evidence of pericardial effusion.  Mitral Valve: Difficult to assess given atrial fibrillation with rapid rate but appears MS is severe by mean gradient ( ) and by PHT (1.2-1.4cm^2). The mitral valve is rheumatic. There is mild thickening of the mitral valve leaflet(s). There is moderate  calcification of the posterior mitral valve leaflet(s). Moderately decreased mobility of the mitral valve leaflets. Trivial mitral valve regurgitation. Severe mitral valve stenosis. MV peak gradient, 21.6 mmHg. The mean mitral valve gradient is 11.2 mmHg.  Tricuspid Valve: The tricuspid valve is normal in structure. Tricuspid valve regurgitation is mild.  Aortic Valve: The aortic valve is tricuspid. Aortic valve regurgitation is not visualized.  Pulmonic Valve: The pulmonic valve was grossly normal. Pulmonic valve regurgitation is not visualized.  Aorta: The aortic root and ascending aorta are structurally normal, with no evidence of dilitation.  Venous: The inferior vena cava is normal in size with less than 50% respiratory variability, suggesting right atrial pressure of 8 mmHg.  IAS/Shunts: The interatrial septum was not well visualized.   LEFT VENTRICLE PLAX 2D LVIDd:         3.30 cm LVIDs:         2.30 cm LV PW:         0.80 cm LV IVS:        0.70 cm LVOT diam:     1.55 cm LV SV:         33 LV SV Index:   23 LVOT Area:     1.89 cm   RIGHT VENTRICLE          IVC RV Basal diam:  1.70 cm  IVC diam: 1.90 cm  LEFT ATRIUM             Index        RIGHT ATRIUM           Index LA diam:        3.50 cm 2.45 cm/m   RA Area:     11.80 cm LA Vol (A2C):   43.2 ml 30.20 ml/m  RA Volume:   23.20 ml  16.22 ml/m LA Vol (A4C):   38.6 ml 26.98 ml/m LA Biplane Vol: 41.9 ml 29.29 ml/m AORTIC VALVE LVOT Vmax:   101.23 cm/s LVOT Vmean:  68.533 cm/s LVOT VTI:    0.173 m  AORTA Ao Root diam: 2.40 cm Ao Asc diam:  2.80 cm  MITRAL VALVE              TRICUSPID VALVE MV Area VTI:  0.70 cm    TR Peak grad:   25.2 mmHg MV Peak grad: 21.6 mmHg   TR Vmax:        251.00 cm/s MV Mean grad: 11.2 mmHg MV Vmax:      2.32 m/s    SHUNTS MV Vmean:     157.3 cm/s  Systemic VTI:  0.17 m Systemic Diam: 1.55 cm  Clearnce Hasten Electronically signed by Clearnce Hasten Signature Date/Time:  09/04/2023/6:23:56 PM    Final (Updated)               Current Reported Medications:.    Current Meds  Medication Sig   acetaminophen (TYLENOL)  500 MG tablet Take 500 mg by mouth every 6 (six) hours as needed for mild pain (pain score 1-3), headache or fever.   aspirin EC (ASPIRIN LOW DOSE) 81 MG tablet TAKE 1 TABLET (81 MG TOTAL) BY MOUTH DAILY. (SWALLOW WHOLE)   atorvastatin (LIPITOR) 80 MG tablet TAKE 1 TABLET (80 MG TOTAL) BY MOUTH DAILY.   benzonatate (TESSALON) 100 MG capsule Take 1 capsule (100 mg total) by mouth 2 (two) times daily as needed for cough.   furosemide (LASIX) 40 MG tablet Take 1 tablet (40 mg total) by mouth daily.   gabapentin (NEURONTIN) 100 MG capsule Take 1 capsule (100 mg total) by mouth at bedtime.   levETIRAcetam (KEPPRA) 250 MG tablet TAKE 1 TABLET (250 MG TOTAL) BY MOUTH 2 (TWO) TIMES DAILY.   levothyroxine (SYNTHROID) 50 MCG tablet TAKE 1 TABLET (50 MCG TOTAL) BY MOUTH AT BEDTIME.   metFORMIN (GLUCOPHAGE-XR) 500 MG 24 hr tablet Take 1 tablet (500 mg total) by mouth daily with breakfast.   metoprolol succinate (TOPROL-XL) 50 MG 24 hr tablet Take 3 tablets (150 mg total) by mouth daily.   mometasone-formoterol (DULERA) 100-5 MCG/ACT AERO Inhale 2 puffs into the lungs in the morning and at bedtime.   spironolactone (ALDACTONE) 25 MG tablet Take 0.5 tablets (12.5 mg total) by mouth daily.   warfarin (COUMADIN) 2.5 MG tablet TAKE 1-2 TABLETS DAILY OR AS PRESCRIBED BY COUMADIN CLINIC (Patient taking differently: Take 2.5-3.75 mg by mouth See admin instructions. Take 3.75mg  (1.5 tablets) by mouth every Wednesday and Thursday, take 2.5mg  (1 tablet) by mouth daily on all other days.)   Physical Exam:    VS:  BP 112/70   Pulse 63   Ht 4\' 11"  (1.499 m)   Wt 107 lb 12.8 oz (48.9 kg)   SpO2 94%   BMI 21.77 kg/m    Wt Readings from Last 3 Encounters:  09/29/23 107 lb 12.8 oz (48.9 kg)  09/18/23 107 lb 14.4 oz (48.9 kg)  09/13/23 106 lb 14.8 oz (48.5 kg)     GEN: Well nourished, well developed in no acute distress NECK: No JVD; No carotid bruits CARDIAC: irregular RR, no murmurs, rubs, gallops RESPIRATORY:  Clear to auscultation without rales, wheezing or rhonchi  ABDOMEN: Soft, non-tender, non-distended EXTREMITIES:  No edema; No acute deformity   Asessement and Plan:.    Rheumatic mitral stenosis/Pulm HTN: S/p valvuloplasty in [place] in 2016/2017. Echo on 09/04/2023 indicated LVEF of 55 to 60%, LV with normal function, no RWMA.  While MR was difficult to assess given atrial fibrillation MS appeared to be severe by mean gradient of 11 mmHg and PHT 1.2 to 1.4 cm. Right heart catheterization consistent with elevated filling pressures.  It was recommended that she undergo outpatient TEE pending improvement in her bronchopneumonia.  Today she presents for follow-up with her son, she reports improvement in her breathing and feels that she is back close to her baseline.  They are anxious to undergo TEE to know if she needs surgery.  Discussed TEE procedure and need for further evaluation of mitral valve.  Patient and son agreed to proceed with TEE, see consent below, she will hold metformin day of procedure.  Pending results of TEE will plan for referral to CT surgery.  Check CBC and BMET, patient has INR check with Coumadin clinic scheduled for 10/02/2023. TEE scheduled for 10/03/23 with Dr. Chilton Si.  Informed Consent   Shared Decision Making/Informed Consent   The risks [esophageal damage, perforation (1:10,000  risk), bleeding, pharyngeal hematoma as well as other potential complications associated with conscious sedation including aspiration, arrhythmia, respiratory failure and death], benefits (treatment guidance and diagnostic support) and alternatives of a transesophageal echocardiogram were discussed in detail with Ms. Piccini and she is willing to proceed.       Permanent atrial fibrillation: EKG today indicates atrial fibrillation at 63  bpm, rate is well-controlled. CHA2DS2-VASc Score = 6 [CHF History: 0, HTN History: 1, Diabetes History: 1, Stroke History: 2, Vascular Disease History: 1, Age Score: 0, Gender Score: 1].  Therefore, the patient's annual risk of stroke is 9.7 %.  She is on Coumadin coumadin, followed by coumadin clinic.  She denies any significant bleeding problems.  She will have check of her INR on 10/02/2023.  CAD: Cardiac catheterization on 09/07/2023 indicated minimal nonobstructive coronary artery disease with patent mid LAD stents.  Patient notes occasional chest discomfort with shortness of breath when her heart rate increases such as with climbing the stairs, notes this has been ongoing and is unchanged.  Reviewed ED precautions.  To undergo TEE for further evaluation of MR as noted above.  Continue aspirin, Lasix, metoprolol succinate, spironolactone and Coumadin.  Bronchopneumonia/Hypoxia: Patient reports significant improvement in her breathing, she is no longer requiring oxygen.  Her lung sounds have improved, there is no crackles or wheezing.  HX of CVA/TIA: On Coumadin and aspirin, followed by Coumadin clinic.  Hx of seizures: On Keppra, followed by neurology.  She denies any recent seizures.  Language barrier: Interpretor services via [place] was available throughout visit,  interpretor name Hetal, ID [id number].     Disposition: F/u with Dr. Cristal Deer at next available appointment, APP pending results of TEE.   Signed, [name], NP

## 2023-09-29 ENCOUNTER — Encounter: Payer: Self-pay | Admitting: Cardiology

## 2023-09-29 ENCOUNTER — Ambulatory Visit: Payer: Medicaid Other | Attending: Cardiology | Admitting: Cardiology

## 2023-09-29 VITALS — BP 112/70 | HR 63 | Ht 59.0 in | Wt 107.8 lb

## 2023-09-29 DIAGNOSIS — I4821 Permanent atrial fibrillation: Secondary | ICD-10-CM | POA: Diagnosis not present

## 2023-09-29 DIAGNOSIS — Z7901 Long term (current) use of anticoagulants: Secondary | ICD-10-CM | POA: Diagnosis not present

## 2023-09-29 DIAGNOSIS — I050 Rheumatic mitral stenosis: Secondary | ICD-10-CM

## 2023-09-29 DIAGNOSIS — R569 Unspecified convulsions: Secondary | ICD-10-CM

## 2023-09-29 DIAGNOSIS — Z8673 Personal history of transient ischemic attack (TIA), and cerebral infarction without residual deficits: Secondary | ICD-10-CM

## 2023-09-29 NOTE — Patient Instructions (Signed)
Medication Instructions:  No labs *If you need a refill on your cardiac medications before your next appointment, please call your pharmacy*   Lab Work: Today we will drawn CBC, and Bmet If you have labs (blood work) drawn today and your tests are completely normal, you will receive your results only by: MyChart Message (if you have MyChart) OR A paper copy in the mail If you have any lab test that is abnormal or we need to change your treatment, we will call you to review the results.   Testing/Procedures:     Dear Christy Poole  You are scheduled for a TEE (Transesophageal Echocardiogram) on Tuesday, December 10 with Dr. Wray Kearns.  Please arrive at the [facility] (Main Entrance A) at [facility]: [address] [place], [place] [address] at 11:15 (This time is 1 hour(s) before your procedure to ensure your preparation).   Free valet parking service is available. You will check in at ADMITTING.   *Please Note: You will receive a call the day before your procedure to confirm the appointment time. That time may have changed from the original time based on the schedule for that day.*  DIET:  Nothing to eat or drink after midnight except a sip of water with medications (see medication instructions below)  MEDICATION INSTRUCTIONS: !!IF ANY NEW MEDICATIONS ARE STARTED AFTER TODAY, PLEASE NOTIFY YOUR PROVIDER AS SOON AS POSSIBLE!!  FYI: Medications such as Semaglutide (Ozempic, [place]), Tirzepatide (Mounjaro, Zepbound), Dulaglutide (Trulicity), etc ("GLP1 agonists") AND Canagliflozin (Invokana), Dapagliflozin (Farxiga), Empagliflozin (Jardiance), Ertugliflozin (Steglatro), Bexagliflozin [employer]) or any combination with one of these drugs such as Invokamet (Canagliflozin/Metformin), Synjardy (Empagliflozin/Metformin), etc ("SGLT2 inhibitors") must be held around the time of a procedure. This is not a comprehensive list of all of these drugs. Please review all of your medications and  talk to your provider if you take any one of these. If you are not sure, ask your provider.  Continue taking your anticoagulant (blood thinner): Warfarin (Coumadin).  You will need to continue this after your procedure until you are told by your provider that it is safe to stop.    LABS: Labs will be drawn today  FYI:  For your safety, and to allow [place] to monitor your vital signs accurately during the surgery/procedure we request: If you have artificial nails, gel coating, SNS etc, please have those removed prior to your surgery/procedure. Not having the nail coverings /polish removed may result in cancellation or delay of your surgery/procedure.  Your support person will be asked to wait in the waiting room during your procedure.  It is OK to have someone drop you off and come back when you are ready to be discharged.  You cannot drive after the procedure and will need someone to drive you home.  Bring your insurance cards.  *Special Note: Every effort is made to have your procedure done on time. Occasionally there are emergencies that occur at the hospital that may cause delays. Please be patient if a delay does occur.    Follow-Up: At [facility], you and your health needs are our priority.  As part of our continuing mission to provide you with exceptional heart care, we have created designated Provider Care Teams.  These Care Teams include your primary Cardiologist (physician) and Advanced Practice Providers (APPs -  Physician Assistants and Nurse Practitioners) who all work together to provide you with the care you need, when you need it.  We recommend signing up for the  patient portal called "MyChart".  Sign up information is provided on this After Visit Summary.  MyChart is used to connect with patients for Virtual Visits (Telemedicine).  Patients are able to view lab/test results, encounter notes, upcoming appointments, etc.  Non-urgent messages can be sent to your provider as  well.   To learn more about what you can do with MyChart, go to [email or web address].    Your next appointment:   After the 10th  Provider:   Jodelle Red, MD

## 2023-09-30 LAB — CBC
Hematocrit: 43.8 % (ref 34.0–46.6)
Hemoglobin: 13.7 g/dL (ref 11.1–15.9)
MCH: 27.5 pg (ref 26.6–33.0)
MCHC: 31.3 g/dL — ABNORMAL LOW (ref 31.5–35.7)
MCV: 88 fL (ref 79–97)
Platelets: 141 10*3/uL — ABNORMAL LOW (ref 150–450)
RBC: 4.98 x10E6/uL (ref 3.77–5.28)
RDW: 13 % (ref 11.7–15.4)
WBC: 6.5 10*3/uL (ref 3.4–10.8)

## 2023-09-30 LAB — BASIC METABOLIC PANEL
BUN/Creatinine Ratio: 16 (ref 12–28)
BUN: 14 mg/dL (ref 8–27)
CO2: 24 mmol/L (ref 20–29)
Calcium: 10.4 mg/dL — ABNORMAL HIGH (ref 8.7–10.3)
Chloride: 98 mmol/L (ref 96–106)
Creatinine, Ser: 0.9 mg/dL (ref 0.57–1.00)
Glucose: 91 mg/dL (ref 70–99)
Potassium: 4.1 mmol/L (ref 3.5–5.2)
Sodium: 140 mmol/L (ref 134–144)
eGFR: 71 mL/min/{1.73_m2} (ref >59)

## 2023-10-02 ENCOUNTER — Ambulatory Visit: Payer: Medicaid Other | Attending: Cardiovascular Disease

## 2023-10-02 ENCOUNTER — Telehealth: Payer: Self-pay | Admitting: Cardiology

## 2023-10-02 DIAGNOSIS — I4821 Permanent atrial fibrillation: Secondary | ICD-10-CM | POA: Diagnosis not present

## 2023-10-02 DIAGNOSIS — Z8673 Personal history of transient ischemic attack (TIA), and cerebral infarction without residual deficits: Secondary | ICD-10-CM | POA: Diagnosis not present

## 2023-10-02 DIAGNOSIS — Z7901 Long term (current) use of anticoagulants: Secondary | ICD-10-CM

## 2023-10-02 LAB — POCT INR: INR: 4.9 — AB (ref 2.0–3.0)

## 2023-10-02 NOTE — Patient Instructions (Signed)
Hold tonight and eat some greens then resume same dosage of warfarin 1.5 tablet daily excpet for 2 tablets on Mondays and Fridays. TEE 12/10 Stay consistent with greens each week (1-2 per week)  Recheck INR in 2 weeks.  Coumadin Clinic [phone] (Goal 2.5-3.0 per Discharge Summary on 12/08/21)

## 2023-10-02 NOTE — Progress Notes (Signed)
Spoke to patient and instructed them to come at 0945  and to be NPO after 0000.  Medications reviewed. Patient has been instructed to hold coumadin tonight by the coumadin clinic.    Confirmed that patient will have a ride home and someone to stay with them for 24 hours after the procedure.

## 2023-10-02 NOTE — Telephone Encounter (Signed)
Patients INR was elevated this morning at 4.9 and platelets Friday were 141, discussed with Dr. Duke Salvia. She will have INR recheck tomorrow prior to TEE, if still elevated procedure may need to be rescheduled. Patient is holding her coumadin today per coumadin clinic and Dr. Duke Salvia. Called and spoke with Ms. Seiden's son per DPR, he is aware of results and is in agreement of plan for recheck of INR in the morning, all questions were answered.

## 2023-10-03 ENCOUNTER — Ambulatory Visit (HOSPITAL_COMMUNITY)
Admission: RE | Admit: 2023-10-03 | Discharge: 2023-10-03 | Disposition: A | Discharge status: Home / Self Care | Payer: Medicaid Other | Attending: Cardiovascular Disease | Admitting: Cardiovascular Disease

## 2023-10-03 ENCOUNTER — Encounter (HOSPITAL_COMMUNITY)
Admission: RE | Disposition: A | Discharge status: Home / Self Care | Payer: Self-pay | Source: Home / Self Care | Attending: Cardiovascular Disease

## 2023-10-03 ENCOUNTER — Encounter (HOSPITAL_COMMUNITY): Payer: Self-pay | Admitting: Cardiovascular Disease

## 2023-10-03 ENCOUNTER — Ambulatory Visit (HOSPITAL_COMMUNITY): Payer: Medicaid Other | Admitting: Anesthesiology

## 2023-10-03 ENCOUNTER — Other Ambulatory Visit: Payer: Self-pay

## 2023-10-03 ENCOUNTER — Ambulatory Visit (HOSPITAL_BASED_OUTPATIENT_CLINIC_OR_DEPARTMENT_OTHER)
Admission: RE | Admit: 2023-10-03 | Discharge: 2023-10-03 | Disposition: A | Discharge status: Home / Self Care | Payer: Medicaid Other | Source: Ambulatory Visit | Attending: Cardiology | Admitting: Cardiology

## 2023-10-03 DIAGNOSIS — I2510 Atherosclerotic heart disease of native coronary artery without angina pectoris: Secondary | ICD-10-CM | POA: Diagnosis not present

## 2023-10-03 DIAGNOSIS — Z8673 Personal history of transient ischemic attack (TIA), and cerebral infarction without residual deficits: Secondary | ICD-10-CM | POA: Insufficient documentation

## 2023-10-03 DIAGNOSIS — Z7901 Long term (current) use of anticoagulants: Secondary | ICD-10-CM | POA: Insufficient documentation

## 2023-10-03 DIAGNOSIS — I050 Rheumatic mitral stenosis: Secondary | ICD-10-CM

## 2023-10-03 DIAGNOSIS — E119 Type 2 diabetes mellitus without complications: Secondary | ICD-10-CM | POA: Diagnosis not present

## 2023-10-03 DIAGNOSIS — Z8669 Personal history of other diseases of the nervous system and sense organs: Secondary | ICD-10-CM | POA: Diagnosis not present

## 2023-10-03 DIAGNOSIS — Z7984 Long term (current) use of oral hypoglycemic drugs: Secondary | ICD-10-CM | POA: Diagnosis not present

## 2023-10-03 DIAGNOSIS — I4819 Other persistent atrial fibrillation: Secondary | ICD-10-CM | POA: Diagnosis not present

## 2023-10-03 DIAGNOSIS — I081 Rheumatic disorders of both mitral and tricuspid valves: Secondary | ICD-10-CM | POA: Diagnosis not present

## 2023-10-03 HISTORY — PX: TRANSESOPHAGEAL ECHOCARDIOGRAM (CATH LAB): EP1270

## 2023-10-03 LAB — PROTIME-INR
INR: 2.4 — ABNORMAL HIGH (ref 0.8–1.2)
Prothrombin Time: 26.2 s — ABNORMAL HIGH (ref 11.4–15.2)

## 2023-10-03 LAB — ECHO TEE
AR max vel: 1.8 cm2
AV Area VTI: 1.72 cm2
AV Area mean vel: 2.1 cm2
AV Mean grad: 2 mm[Hg]
AV Peak grad: 3.3 mm[Hg]
Ao pk vel: 0.91 m/s
Area-P 1/2: 1.8 cm2
MV VTI: 0.82 cm2

## 2023-10-03 SURGERY — TRANSESOPHAGEAL ECHOCARDIOGRAM (TEE) (CATHLAB)
Anesthesia: General

## 2023-10-03 MED ORDER — SODIUM CHLORIDE 0.9 % IV SOLN: INTRAVENOUS | Status: DC

## 2023-10-03 MED ORDER — PROPOFOL 10 MG/ML IV BOLUS
INTRAVENOUS | Status: DC | PRN
  Administered 2023-10-03: 40 mg via INTRAVENOUS
  Administered 2023-10-03: 60 mg via INTRAVENOUS

## 2023-10-03 MED ORDER — PHENYLEPHRINE 80 MCG/ML (10ML) SYRINGE FOR IV PUSH (FOR BLOOD PRESSURE SUPPORT)
PREFILLED_SYRINGE | INTRAVENOUS | Status: DC | PRN
  Administered 2023-10-03 (×3): 80 ug via INTRAVENOUS

## 2023-10-03 MED ORDER — PROPOFOL 500 MG/50ML IV EMUL
INTRAVENOUS | Status: DC | PRN
  Administered 2023-10-03: 75 ug/kg/min via INTRAVENOUS

## 2023-10-03 MED ORDER — LIDOCAINE 2% (20 MG/ML) 5 ML SYRINGE
INTRAMUSCULAR | Status: DC | PRN
  Administered 2023-10-03: 100 mg via INTRAVENOUS

## 2023-10-03 NOTE — Discharge Instructions (Signed)

## 2023-10-03 NOTE — Anesthesia Postprocedure Evaluation (Signed)
Anesthesia Post Note  Patient: Rickia HANIN DEIDA  Procedure(s) Performed: TRANSESOPHAGEAL ECHOCARDIOGRAM     Patient location during evaluation: PACU Anesthesia Type: General Level of consciousness: awake and alert Pain management: pain level controlled Vital Signs Assessment: post-procedure vital signs reviewed and stable Respiratory status: spontaneous breathing, nonlabored ventilation, respiratory function stable and patient connected to nasal cannula oxygen Cardiovascular status: blood pressure returned to baseline and stable Postop Assessment: no apparent nausea or vomiting Anesthetic complications: no   No notable events documented.  Last Vitals:  Vitals:   10/03/23 1436 10/03/23 1500  BP: 116/65 117/69  Pulse: 71 88  Resp: (!) 23 18  Temp: 36.7 C   SpO2: 100% (!) 88%    Last Pain:  Vitals:   10/03/23 1436  TempSrc: Temporal  PainSc: 0-No pain                 Mariann Barter

## 2023-10-03 NOTE — CV Procedure (Signed)
Brief TEE Note  LVEF 60-65% No LA/LAA thrombus or masses Rheumatic mitral stenosis.  Moderate by PHT and mean gradient.  Trivial mitral regurgitation. Mild-moderate tricuspid regurgitation.  For additional details see full report.   Mitral stenosis severity can be underestimated when under anesthesia.   Kaseem Vastine C. Duke Salvia, MD, [facility] 10/03/2023 2:32 PM

## 2023-10-03 NOTE — Transfer of Care (Signed)
Immediate Anesthesia Transfer of Care Note  Patient: Christy Poole  Procedure(s) Performed: TRANSESOPHAGEAL ECHOCARDIOGRAM  Patient Location: Cath Lab  Anesthesia Type:MAC  Level of Consciousness: drowsy and patient cooperative  Airway & Oxygen Therapy: Patient Spontanous Breathing and Patient connected to nasal cannula oxygen  Post-op Assessment: Report given to RN and Post -op Vital signs reviewed and stable  Post vital signs: Reviewed and stable  Last Vitals:  Vitals Value Taken Time  BP    Temp    Pulse    Resp    SpO2      Last Pain:  Vitals:   10/03/23 1122  TempSrc: Temporal         Complications: No notable events documented.

## 2023-10-03 NOTE — Anesthesia Preprocedure Evaluation (Addendum)
Anesthesia Evaluation  Patient identified by MRN, date of birth, ID band Patient awake    Reviewed: Allergy & Precautions, NPO status , Patient's Chart, lab work & pertinent test results, reviewed documented beta blocker date and time   History of Anesthesia Complications Negative for: history of anesthetic complications  Airway Mallampati: II  TM Distance: >3 FB     Dental  (+) Edentulous Upper, Edentulous Lower   Pulmonary neg shortness of breath, neg COPD, neg PE   breath sounds clear to auscultation       Cardiovascular (-) hypertension(-) angina + CAD and + Cardiac Stents  (-) Past MI  Rhythm:Irregular Rate:Normal + Diastolic murmurs    Neuro/Psych neg Seizures CVA    GI/Hepatic ,neg GERD  ,,(+) neg Cirrhosis        Endo/Other  diabetes, Type 2Hypothyroidism    Renal/GU CRFRenal disease     Musculoskeletal   Abdominal   Peds  Hematology   Anesthesia Other Findings   Reproductive/Obstetrics                             Anesthesia Physical Anesthesia Plan  ASA: 3  Anesthesia Plan: MAC   Post-op Pain Management:    Induction: Intravenous  PONV Risk Score and Plan: 2 and Propofol infusion  Airway Management Planned:   Additional Equipment:   Intra-op Plan:   Post-operative Plan:   Informed Consent: I have reviewed the patients History and Physical, chart, labs and discussed the procedure including the risks, benefits and alternatives for the proposed anesthesia with the patient or authorized representative who has indicated his/her understanding and acceptance.     Interpreter used for interview  Plan Discussed with: CRNA  Anesthesia Plan Comments:        Anesthesia Quick Evaluation

## 2023-10-03 NOTE — Interval H&P Note (Signed)
History and Physical Interval Note:  10/03/2023 1:32 PM  Christy Poole  has presented today for surgery, with the diagnosis of RHEUMATIC MITRAL VALVE STENOSIS.  The various methods of treatment have been discussed with the patient and family. After consideration of risks, benefits and other options for treatment, the patient has consented to  Procedure(s): TRANSESOPHAGEAL ECHOCARDIOGRAM (N/A) as a surgical intervention.  The patient's history has been reviewed, patient examined, no change in status, stable for surgery.  I have reviewed the patient's chart and labs.  Questions were answered to the patient's satisfaction.     Chilton Si, MD

## 2023-10-07 ENCOUNTER — Other Ambulatory Visit (HOSPITAL_BASED_OUTPATIENT_CLINIC_OR_DEPARTMENT_OTHER): Payer: Self-pay | Admitting: Family Medicine

## 2023-10-07 DIAGNOSIS — E119 Type 2 diabetes mellitus without complications: Secondary | ICD-10-CM

## 2023-10-09 ENCOUNTER — Telehealth: Payer: Self-pay

## 2023-10-09 DIAGNOSIS — I050 Rheumatic mitral stenosis: Secondary | ICD-10-CM

## 2023-10-09 NOTE — Telephone Encounter (Signed)
-----   Message from [name] sent at 10/09/2023  7:55 AM EST ----- Please let Christy Poole and her son know that her TEE shows normal heart squeeze and function. Her mitral valve stenosis was moderate to severe, discussed with Dr. Cristal Deer, she agrees with referral to CT surgery. Please refer Christy Poole to CT surgery for mitral valve stenosis.

## 2023-10-09 NOTE — Telephone Encounter (Signed)
Called patient advised of below they verbalized understanding.

## 2023-10-12 ENCOUNTER — Ambulatory Visit (INDEPENDENT_AMBULATORY_CARE_PROVIDER_SITE_OTHER): Payer: Medicaid Other | Admitting: Family Medicine

## 2023-10-12 VITALS — BP 105/73 | HR 84 | Ht 59.0 in | Wt 107.5 lb

## 2023-10-12 DIAGNOSIS — N182 Chronic kidney disease, stage 2 (mild): Secondary | ICD-10-CM | POA: Diagnosis not present

## 2023-10-12 DIAGNOSIS — G629 Polyneuropathy, unspecified: Secondary | ICD-10-CM | POA: Insufficient documentation

## 2023-10-12 DIAGNOSIS — E1122 Type 2 diabetes mellitus with diabetic chronic kidney disease: Secondary | ICD-10-CM | POA: Diagnosis not present

## 2023-10-12 NOTE — Progress Notes (Signed)
Subjective:   Christy Poole 15-Oct-1961 10/12/2023  Chief Complaint  Patient presents with   Diabetes    Patient is here following on diabetes. Denies any main concerns.    HPI: Christy Poole presents today for re-assessment and management of chronic medical conditions.  DIABETES MELLITUS: Christy Poole presents for the medical management of diabetes.  Current diabetes medication regimen: Metformin 500mg  QD Patient is  adhering to a diabetic diet.  Patient is not exercising regularly. Does perform ADL and activities around the house.  Patient is  checking BS regularly.  Patient is  checking their feet regularly.  Denies polydipsia, polyphagia, polyuria, open wounds or ulcers on feet.   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 She is taking Gabapentin 100mg  at nighttime. Reports daytime intermittent numbness and tingling to bilateral feet.   Lab Results  Component Value Date   HGBA1C 7.0 (H) 09/12/2023    10/12/2023 Lab Results  Component Value Date   LABMICR 25.7 02/09/2023   LABMICR 8.8 12/29/2021    Wt Readings from Last 3 Encounters:  10/12/23 107 lb 8 oz (48.8 kg)  09/29/23 107 lb 12.8 oz (48.9 kg)  09/18/23 107 lb 14.4 oz (48.9 kg)     The following portions of the patient's history were reviewed and updated as appropriate: past medical history, past surgical history, family history, social history, allergies, medications, and problem list.   Patient Active Problem List   Diagnosis Date Noted   Peripheral polyneuropathy 10/12/2023   Physical deconditioning 09/18/2023   AKI (acute kidney injury) (HCC) 09/08/2023   Renal insufficiency 09/06/2023   Supratherapeutic INR 09/06/2023   Dyspnea on exertion 09/05/2023   Precordial chest pain 09/05/2023   Respiratory distress 09/04/2023   Cough 08/30/2023   Seizure-like activity (HCC) 08/10/2022   Pulmonary nodule 02/10/2022   Long term (current) use of  anticoagulants 12/17/2021   Thrombocytopenia (HCC) 12/10/2021   Rheumatic mitral stenosis 12/10/2021   Left hand weakness 12/09/2021   Diabetes mellitus (HCC) 12/09/2021   Hypothyroidism 12/01/2021   Permanent atrial fibrillation (HCC) 12/01/2021   Hx-TIA (transient ischemic attack) 12/01/2021   Dyslipidemia 12/01/2021   Past Medical History:  Diagnosis Date   Atrial fibrillation (HCC)    Diabetes mellitus without complication (HCC)    Stroke (HCC)    Thyroid disease    Past Surgical History:  Procedure Laterality Date   RIGHT/LEFT HEART CATH AND CORONARY ANGIOGRAPHY N/A 09/07/2023   Procedure: RIGHT/LEFT HEART CATH AND CORONARY ANGIOGRAPHY;  Surgeon: Orbie Pyo, MD;  Location: [facility] INVASIVE CV LAB;  Service: Cardiovascular;  Laterality: N/A;   TRANSESOPHAGEAL ECHOCARDIOGRAM (CATH LAB) N/A 10/03/2023   Procedure: TRANSESOPHAGEAL ECHOCARDIOGRAM;  Surgeon: Chilton Si, MD;  Location: [facility] INVASIVE CV LAB;  Service: Cardiovascular;  Laterality: N/A;   History reviewed. No pertinent family history. Outpatient Medications Prior to Visit  Medication Sig Dispense Refill   acetaminophen (TYLENOL) 500 MG tablet Take 500 mg by mouth every 6 (six) hours as needed for mild pain (pain score 1-3), headache or fever.     albuterol (VENTOLIN HFA) 108 (90 Base) MCG/ACT inhaler Inhale 1-2 puffs into the lungs every 4 (four) hours as needed for wheezing or shortness of breath. 1 each 2   aspirin EC (ASPIRIN LOW DOSE) 81 MG tablet TAKE 1 TABLET (81 MG TOTAL) BY MOUTH DAILY. (SWALLOW WHOLE) 90 tablet 3   atorvastatin (LIPITOR) 80 MG tablet TAKE 1 TABLET (80 MG TOTAL) BY MOUTH  DAILY. 90 tablet 3   cetaphil (CETAPHIL) lotion Apply 1 Application topically daily as needed for dry skin.     furosemide (LASIX) 40 MG tablet Take 1 tablet (40 mg total) by mouth daily. 30 tablet 1   gabapentin (NEURONTIN) 100 MG capsule Take 1 capsule (100 mg total) by mouth at bedtime. 60 capsule 1   levETIRAcetam  (KEPPRA) 250 MG tablet TAKE 1 TABLET (250 MG TOTAL) BY MOUTH 2 (TWO) TIMES DAILY. 60 tablet 3   levothyroxine (SYNTHROID) 50 MCG tablet TAKE 1 TABLET (50 MCG TOTAL) BY MOUTH AT BEDTIME. 90 tablet 0   metFORMIN (GLUCOPHAGE-XR) 500 MG 24 hr tablet TAKE 1 TABLET (500 MG TOTAL) BY MOUTH DAILY WITH BREAKFAST. 90 tablet 1   metoprolol succinate (TOPROL-XL) 50 MG 24 hr tablet Take 3 tablets (150 mg total) by mouth daily. (Patient taking differently: Take 50 mg by mouth 3 (three) times daily.) 90 tablet 1   mometasone-formoterol (DULERA) 100-5 MCG/ACT AERO Inhale 2 puffs into the lungs in the morning and at bedtime. 1 each 2   Spacer/Aero-Holding Chambers (OPTICHAMBER DIAMOND-LG MASK) DEVI Use as directed. 1 each 0   spironolactone (ALDACTONE) 25 MG tablet Take 0.5 tablets (12.5 mg total) by mouth daily. 30 tablet 1   warfarin (COUMADIN) 2.5 MG tablet TAKE 1-2 TABLETS DAILY OR AS PRESCRIBED BY COUMADIN CLINIC (Patient taking differently: Take 3.75-5 mg by mouth See admin instructions. Take 5 mg by mouth every Monday and Fri, take 3.75 mg by mouth daily on all other days in the evening. Or as  directed by clinic) 60 tablet 3   benzonatate (TESSALON) 100 MG capsule Take 1 capsule (100 mg total) by mouth 2 (two) times daily as needed for cough. 30 capsule 0   No facility-administered medications prior to visit.   Allergies  Allergen Reactions   Other Other (See Comments)    Patient is vegetarian no meat can eat eggs     ROS: A complete ROS was performed with pertinent positives/negatives noted in the HPI. The remainder of the ROS are negative.    Objective:   Today's Vitals   10/12/23 0925  BP: 105/73  Pulse: 84  SpO2: 98%  Weight: 107 lb 8 oz (48.8 kg)  Height: 4\' 11"  (1.499 m)    Physical Exam          GENERAL: Well-appearing, in NAD. Well nourished.  SKIN: Pink, warm and dry. No rash, lesion, ulceration, or ecchymoses.  Head: Normocephalic. NECK: Trachea midline. Full ROM w/o pain or  tenderness.  RESPIRATORY: Chest wall symmetrical. Respirations even and non-labored. Breath sounds clear to auscultation bilaterally.  CARDIAC: S1, S2 present, regular rate and rhythm without murmur or gallops. Peripheral pulses 2+ bilaterally.  MSK: Muscle tone and strength appropriate for age.  EXTREMITIES: Without clubbing, cyanosis, or edema.  NEUROLOGIC: No motor or sensory deficits. Steady, even gait. C2-C12 intact.  PSYCH/MENTAL STATUS: Alert, oriented x 3. Cooperative, appropriate mood and affect.   Title   Diabetic Foot Exam - detailed Date & Time: 10/12/2023  9:53 AM Diabetic Foot exam was performed with the following findings: Yes  Visual Foot Exam completed.: Yes  Is there a history of foot ulcer?: No Is there a foot ulcer now?: No Is there swelling?: No Is there elevated skin temperature?: No Is there abnormal foot shape?: No Is there a claw toe deformity?: No Are the toenails long?: No Are the toenails thick?: Yes Are the toenails ingrown?: No Is the skin thin, fragile, shiny  and hairless?": No Normal Range of Motion?: Yes Is there foot or ankle muscle weakness?: Yes Do you have pain in calf while walking?: No Are the shoes appropriate in style and fit?: Yes Can the patient see the bottom of their feet?: Yes Pulse Foot Exam completed.: Yes   Right Posterior Tibialis: Present Left posterior Tibialis: Present   Right Dorsalis Pedis: Present Left Dorsalis Pedis: Present     Semmes-Weinstein Monofilament Test "+" means "has sensation" and "-" means "no sensation"  R Foot Test Control: Neg L Foot Test Control: Neg   R Site 1-Great Toe: Neg L Site 1-Great Toe: Neg   R Site 4: Neg L Site 4: Neg   R site 5: Neg L Site 5: Neg  R Site 6: Neg L Site 6: Neg     Image components are not supported.   Image components are not supported. Image components are not supported.  Tuning [place]ork Right vibratory: diminished Left vibratory: diminished  Comments      Health  Maintenance Due  Topic Date Due   OPHTHALMOLOGY EXAM  Never done   Hepatitis C Screening  Never done   DTaP/Tdap/Td (1 - Tdap) Never done   Cervical Cancer Screening (HPV/Pap Cotest)  Never done   Colonoscopy  Never done   Zoster Vaccines- Shingrix (1 of 2) Never done   COVID-19 Vaccine (1 - 2024-25 season) Never done      Assessment & Plan:  1. Type 2 diabetes mellitus with stage 2 chronic kidney disease, without long-term current use of insulin (HCC) (Primary) Stable. A1C 7.0 and well controlled. Will continue on Metformin 500mg  daily and continue good diet and regular exercise. Referral placed to Ophthalmology for DM eye exam. Foot exam completed.  - Ambulatory referral to Ophthalmology  2. Peripheral polyneuropathy Increase Gabapentin 100mg  to up to TID PRN for nerve pain. Discussed fall prevention and safe use of medication.   Return in about 3 months (around 01/10/2024) for DIABETES CHECK UP.    Patient to reach out to office if new, worrisome, or unresolved symptoms arise or if no improvement in patient's condition. Patient verbalized understanding and is agreeable to treatment plan. All questions answered to patient's satisfaction.    Hilbert Bible, [place]

## 2023-10-12 NOTE — Patient Instructions (Signed)
You can increase your Gabapentin to a 100mg  capsule up to three times per day for nerve pain. Be careful as this can make you drowsy.

## 2023-10-13 ENCOUNTER — Ambulatory Visit (HOSPITAL_BASED_OUTPATIENT_CLINIC_OR_DEPARTMENT_OTHER): Payer: Medicaid Other | Admitting: Family Medicine

## 2023-10-17 ENCOUNTER — Ambulatory Visit: Payer: Medicaid Other | Attending: Cardiovascular Disease | Admitting: Pharmacist

## 2023-10-17 DIAGNOSIS — I4821 Permanent atrial fibrillation: Secondary | ICD-10-CM | POA: Diagnosis not present

## 2023-10-17 DIAGNOSIS — Z8673 Personal history of transient ischemic attack (TIA), and cerebral infarction without residual deficits: Secondary | ICD-10-CM

## 2023-10-17 DIAGNOSIS — Z7901 Long term (current) use of anticoagulants: Secondary | ICD-10-CM

## 2023-10-17 LAB — POCT INR: INR: 4.6 — AB (ref 2.0–3.0)

## 2023-10-17 NOTE — Patient Instructions (Addendum)
Description   Hold dose tonight and eat some greens then reduce dosage to 1.5 tablets every day  Stay consistent with greens each week (1-2 per week)  Recheck INR in 2 weeks.  Coumadin Clinic [phone] (Goal 2.5-3.0 per Discharge Summary on 12/08/21)

## 2023-11-01 ENCOUNTER — Ambulatory Visit: Payer: Medicaid Other | Attending: Cardiovascular Disease | Admitting: *Deleted

## 2023-11-01 DIAGNOSIS — Z7901 Long term (current) use of anticoagulants: Secondary | ICD-10-CM | POA: Diagnosis not present

## 2023-11-01 DIAGNOSIS — I639 Cerebral infarction, unspecified: Secondary | ICD-10-CM

## 2023-11-01 DIAGNOSIS — I4821 Permanent atrial fibrillation: Secondary | ICD-10-CM | POA: Diagnosis not present

## 2023-11-01 DIAGNOSIS — Z8673 Personal history of transient ischemic attack (TIA), and cerebral infarction without residual deficits: Secondary | ICD-10-CM

## 2023-11-01 HISTORY — DX: Cerebral infarction, unspecified: I63.9

## 2023-11-01 LAB — POCT INR: INR: 3.4 — AB (ref 2.0–3.0)

## 2023-11-01 NOTE — Patient Instructions (Signed)
 Description   Tomorrow take 1/2 tablet of warfarin then START 1.5 tablets every day except 1 tablet of Sundays.  Stay consistent with greens each week (1-2 per week)  Recheck INR in 2 weeks.  Coumadin  Clinic [phone] (Goal 2.5-3.0 per Discharge Summary on 12/08/21)

## 2023-11-06 ENCOUNTER — Encounter: Payer: Medicaid Other | Attending: Family Medicine | Admitting: Skilled Nursing Facility1

## 2023-11-06 ENCOUNTER — Encounter: Payer: Self-pay | Admitting: Skilled Nursing Facility1

## 2023-11-06 VITALS — Ht 59.0 in | Wt 109.5 lb

## 2023-11-06 DIAGNOSIS — N182 Chronic kidney disease, stage 2 (mild): Secondary | ICD-10-CM | POA: Diagnosis present

## 2023-11-06 DIAGNOSIS — E1122 Type 2 diabetes mellitus with diabetic chronic kidney disease: Secondary | ICD-10-CM | POA: Insufficient documentation

## 2023-11-06 NOTE — Progress Notes (Signed)
 Appointment time: 8:10  Pt declined [facility] interpreting services wishing for her son to interpret for her (from signed). Pts son states her dentures to not fit well making it hard for her to chew. Pts son states his wife cooks for the family.  Pts son states he also has DM and his insurance company has been sending him pre made meals. Pts son states him, his wife, their son and the pt all eat pretty processed eating more snacks than anything else. Pt states he is very motivated to get the whole household eating well balanced meals and being physically active stating it is going to be difficult as him and his wife work 10+ hours daily but he is committed to the lifestyle change.   Dietitian: in review of pts diet pt is not meeting her protein needs: educated pts son on meal creation involving vegetarian proteins to ensure the pt is getting her nutrient needs met.   Pt has some poor cognition with a reduce memory, pts son states he and his wife ensure her meals are prepared for for the day while they are at work stating she does eat them.   A1C 7.0  DM meds: Metformin    Diabetes Self-Management Education  Visit Type: First/Initial  Appt. Start Time: 8:09  Appt. End Time: 9:30  11/06/2023  Christy Poole, identified by name and date of birth, is a 63 y.o. female with a diagnosis of Diabetes: Type 2.   ASSESSMENT  Height 4' 11 (1.499 m), weight 109 lb 8 oz (49.7 kg). Body mass index is 22.12 kg/m.   Diabetes Self-Management Education - 11/06/23 9178       Visit Information   Visit Type First/Initial      Initial Visit   Diabetes Type Type 2    Are you currently following a meal plan? No    Are you taking your medications as prescribed? Yes      Health Coping   How would you rate your overall health? Good      Psychosocial Assessment   Patient Belief/Attitude about Diabetes Motivated to manage diabetes    What is the hardest part about your diabetes right now, causing you  the most concern, or is the most worrisome to you about your diabetes?   Making healty food and beverage choices    Self-management support Family    Other persons present Family Member    Patient Concerns Nutrition/Meal planning    Special Needs Simplified materials    Preferred Learning Style Auditory;Hands on    Learning Readiness Ready    How often do you need to have someone help you when you read instructions, pamphlets, or other written materials from your doctor or pharmacy? 5 - Always      Pre-Education Assessment   Patient understands the diabetes disease and treatment process. Needs Instruction    Patient understands incorporating nutritional management into lifestyle. Needs Instruction    Patient undertands incorporating physical activity into lifestyle. Needs Instruction    Patient understands using medications safely. Needs Instruction    Patient understands monitoring blood glucose, interpreting and using results Needs Instruction    Patient understands prevention, detection, and treatment of acute complications. Needs Instruction    Patient understands prevention, detection, and treatment of chronic complications. Needs Instruction    Patient understands how to develop strategies to address psychosocial issues. Needs Instruction    Patient understands how to develop strategies to promote health/change behavior. Needs Instruction  Complications   Last HgB A1C per patient/outside source 7 %    How often do you check your blood sugar? 0 times/day (not testing)    Have you had a dilated eye exam in the past 12 months? No    Have you had a dental exam in the past 12 months? No    Are you checking your feet? No      Dietary Intake   Breakfast (7-8am) 1-2 tortilla + tea + milk    Lunch mashed potato and flor in oil + low fat yogurt    Dinner rice + onion + pepper + corn    Beverage(s) tea + splenda, milk, water, rarley soda      Activity / Exercise   Activity / Exercise  Type ADL's    How many days per week do you exercise? 0    How many minutes per day do you exercise? 0    Total minutes per week of exercise 0      Patient Education   Previous Diabetes Education No    Disease Pathophysiology Definition of diabetes, type 1 and 2, and the diagnosis of diabetes    Healthy Eating Plate Method;Role of diet in the treatment of diabetes and the relationship between the three main macronutrients and blood glucose level;Reviewed blood glucose goals for pre and post meals and how to evaluate the patients' food intake on their blood glucose level.;Meal timing in regards to the patients' current diabetes medication.    Being Active Role of exercise on diabetes management, blood pressure control and cardiac health.;Identified with patient nutritional and/or medication changes necessary with exercise.    Medications Reviewed patients medication for diabetes, action, purpose, timing of dose and side effects.    Monitoring Purpose and frequency of SMBG.    Acute complications Taught prevention, symptoms, and  treatment of hypoglycemia - the 15 rule.;Discussed and identified patients' prevention, symptoms, and treatment of hyperglycemia.    Chronic complications Dental care;Retinopathy and reason for yearly dilated eye exams;Nephropathy, what it is, prevention of, the use of ACE, ARB's and early detection of through urine microalbumia.;Assessed and discussed foot care and prevention of foot problems    Lifestyle and Health Coping Lifestyle issues that need to be addressed for better diabetes care      Individualized Goals (developed by patient)   Nutrition Follow meal plan discussed;General guidelines for healthy choices and portions discussed    Physical Activity Exercise 5-7 days per week;15 minutes per day    Medications take my medication as prescribed    Monitoring  Test my blood glucose as discussed    Problem Solving Eating Pattern    Reducing Risk do foot checks  daily;treat hypoglycemia with 15 grams of carbs if blood glucose less than 70mg /dL      Post-Education Assessment   Patient understands the diabetes disease and treatment process. Demonstrates understanding / competency    Patient understands incorporating nutritional management into lifestyle. Demonstrates understanding / competency    Patient undertands incorporating physical activity into lifestyle. Demonstrates understanding / competency    Patient understands using medications safely. Demonstrates understanding / competency    Patient understands monitoring blood glucose, interpreting and using results Demonstrates understanding / competency    Patient understands prevention, detection, and treatment of acute complications. Demonstrates understanding / competency    Patient understands prevention, detection, and treatment of chronic complications. Demonstrates understanding / competency    Patient understands how to develop strategies to address psychosocial issues. Demonstrates  understanding / competency    Patient understands how to develop strategies to promote health/change behavior. Demonstrates understanding / competency      Outcomes   Expected Outcomes Demonstrated interest in learning but significant barriers to change    Future DMSE 4-6 wks             Individualized Plan for Diabetes Self-Management Training:   Learning Objective:  Patient will have a greater understanding of diabetes self-management. Patient education plan is to attend individual and/or group sessions per assessed needs and concerns.   Expected Outcomes:  Demonstrated interest in learning but significant barriers to change  Education material provided: ADA - How to Thrive: A Guide for Your Journey with Diabetes, Meal plan card, and My Plate  If problems or questions, patient to contact team via:  Phone and Email  Future DSME appointment: 4-6 wks

## 2023-11-07 ENCOUNTER — Other Ambulatory Visit: Payer: Self-pay | Admitting: Neurology

## 2023-11-08 NOTE — Telephone Encounter (Signed)
 Last seen on 07/12/23 Follow up scheduled on 01/29/24

## 2023-11-08 NOTE — Progress Notes (Signed)
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 Pt was admitted with bronchopneumonia and on work up was found to have MS with a mean gradient of 11 mmHG and normal LV function. No significant MR. Pt with long history of afib on coumadin and had previous LAD stenting and MV balloon valvuloplasty in [place] in 2018. Pt has suffered a stroke in the past. She currently has DOE with walking stairs and also occasional rest CP. She had cath without CAD and moderate PHTN. Pt was felt best to be served with MV replacement and has been sent for consideration. Her son was serving as interpreter and called his uncle in [place] who is an intensivist. We discussed her issues over the phone and he then discussed this with her son and herself. PT is very nervous about surgery and became tearful      Past Medical History:  Diagnosis Date   Atrial fibrillation (HCC)    Diabetes mellitus without complication (HCC)    Stroke [facility])    Thyroid disease     Past Surgical History:  Procedure Laterality Date   RIGHT/LEFT HEART CATH AND CORONARY ANGIOGRAPHY N/A 09/07/2023   Procedure: RIGHT/LEFT HEART CATH AND CORONARY ANGIOGRAPHY;  Surgeon: Orbie Pyo, MD;  Location: [facility] INVASIVE CV LAB;  Service: Cardiovascular;  Laterality: N/A;   TRANSESOPHAGEAL ECHOCARDIOGRAM (CATH LAB) N/A 10/03/2023   Procedure: TRANSESOPHAGEAL ECHOCARDIOGRAM;  Surgeon: Chilton Si, MD;  Location: [facility] INVASIVE CV LAB;  Service: Cardiovascular;  Laterality: N/A;    Social History   Tobacco Use  Smoking Status Never   Passive exposure: Never  Smokeless Tobacco Never    Social History   Substance and Sexual  Activity  Alcohol Use Never    Social History   Socioeconomic History   Marital status: Widowed    Spouse name: Not on file   Number of children: Not on file   Years of education: Not on file   Highest education level: GED or equivalent  Occupational History   Not on file  Tobacco Use   Smoking status: Never    Passive exposure: Never   Smokeless tobacco: Never  Vaping Use   Vaping status: Never Used  Substance and Sexual Activity   Alcohol use: Never   Drug use: Never   Sexual activity: Never  Other Topics Concern   Not on file  Social History Narrative   Not on file   Social Drivers of Health   Financial Resource Strain: Patient Declined (10/12/2023)   Overall Financial Resource Strain (CARDIA)    Difficulty of Paying Living Expenses: Patient declined  Recent Concern: Financial Resource Strain - High Risk (08/30/2023)   Overall Financial Resource Strain (CARDIA)    Difficulty of Paying Living Expenses: Hard  Food Insecurity: Patient Declined (10/12/2023)   Hunger Vital Sign    Worried About Running Out of Food in the Last Year: Patient declined    Ran Out of Food in the Last Year: Patient declined  Recent Concern: Food Insecurity - Food  Insecurity Present (08/30/2023)   Hunger Vital Sign    Worried About Running Out of Food in the Last Year: Often true    Ran Out of Food in the Last Year: Often true  Transportation Needs: Patient Declined (10/12/2023)   PRAPARE - Administrator, Civil Service (Medical): Patient declined    Lack of Transportation (Non-Medical): Patient declined  Recent Concern: Transportation Needs - Unmet Transportation Needs (08/30/2023)   PRAPARE - Administrator, Civil Service (Medical): Yes    Lack of Transportation (Non-Medical): Yes  Physical Activity: Inactive (08/30/2023)   Exercise Vital Sign    Days of Exercise per Week: 0 days    Minutes of Exercise per Session: 10 min  Stress: Stress Concern Present (08/30/2023)    [employer] of Occupational Health - Occupational Stress Questionnaire    Feeling of Stress : Very much  Social Connections: Socially Isolated (08/30/2023)   Social Connection and Isolation Panel [NHANES]    Frequency of Communication with Friends and Family: Once a week    Frequency of Social Gatherings with Friends and Family: Three times a week    Attends Religious Services: Never    Active Member of Clubs or Organizations: No    Attends Banker Meetings: Not on file    Marital Status: Widowed  Intimate Partner Violence: Patient Unable To Answer (09/14/2023)   Humiliation, Afraid, Rape, and Kick questionnaire    Fear of Current or Ex-Partner: Patient unable to answer    Emotionally Abused: Patient unable to answer    Physically Abused: Patient unable to answer    Sexually Abused: Patient unable to answer    Allergies  Allergen Reactions   Other Other (See Comments)    Patient is vegetarian no meat can eat eggs    Current Outpatient Medications  Medication Sig Dispense Refill   acetaminophen (TYLENOL) 500 MG tablet Take 500 mg by mouth every 6 (six) hours as needed for mild pain (pain score 1-3), headache or fever.     albuterol (VENTOLIN HFA) 108 (90 Base) MCG/ACT inhaler Inhale 1-2 puffs into the lungs every 4 (four) hours as needed for wheezing or shortness of breath. 1 each 2   aspirin EC (ASPIRIN LOW DOSE) 81 MG tablet TAKE 1 TABLET (81 MG TOTAL) BY MOUTH DAILY. (SWALLOW WHOLE) 90 tablet 3   atorvastatin (LIPITOR) 80 MG tablet TAKE 1 TABLET (80 MG TOTAL) BY MOUTH DAILY. 90 tablet 3   cetaphil (CETAPHIL) lotion Apply 1 Application topically daily as needed for dry skin.     furosemide (LASIX) 40 MG tablet Take 1 tablet (40 mg total) by mouth daily. 30 tablet 1   gabapentin (NEURONTIN) 100 MG capsule Take 1 capsule (100 mg total) by mouth at bedtime. 60 capsule 1   levETIRAcetam (KEPPRA) 250 MG tablet TAKE 1 TABLET (250 MG TOTAL) BY MOUTH 2 (TWO) TIMES  DAILY. 60 tablet 3   levothyroxine (SYNTHROID) 50 MCG tablet TAKE 1 TABLET (50 MCG TOTAL) BY MOUTH AT BEDTIME. 90 tablet 0   metFORMIN (GLUCOPHAGE-XR) 500 MG 24 hr tablet TAKE 1 TABLET (500 MG TOTAL) BY MOUTH DAILY WITH BREAKFAST. 90 tablet 1   metoprolol succinate (TOPROL-XL) 50 MG 24 hr tablet Take 3 tablets (150 mg total) by mouth daily. (Patient taking differently: Take 50 mg by mouth 3 (three) times daily.) 90 tablet 1   mometasone-formoterol (DULERA) 100-5 MCG/ACT AERO Inhale 2 puffs into the lungs in the morning and at bedtime. 1 each  2   Spacer/Aero-Holding Chambers (OPTICHAMBER DIAMOND-LG MASK) DEVI Use as directed. 1 each 0   spironolactone (ALDACTONE) 25 MG tablet Take 0.5 tablets (12.5 mg total) by mouth daily. 30 tablet 1   warfarin (COUMADIN) 2.5 MG tablet TAKE 1-2 TABLETS DAILY OR AS PRESCRIBED BY COUMADIN CLINIC (Patient taking differently: Take 3.75-5 mg by mouth See admin instructions. Take 5 mg by mouth every Monday and Fri, take 3.75 mg by mouth daily on all other days in the evening. Or as  directed by clinic) 60 tablet 3   No current facility-administered medications for this visit.     No family history on file.     Physical Exam: Lungs: clear Card: irr with 2/6 diastolic murmur Ext: no edema Neuro: appears intact     Diagnostic Studies & Laboratory data: I have personally reviewed the following studies and agree with the findings   TTE(08/2023) IMPRESSIONS     1. Left ventricular ejection fraction, by estimation, is 55 to 60%. The  left ventricle has normal function. The left ventricle has no regional  wall motion abnormalities. indeterminate due to atrial fibrillation. There  is the interventricular septum is  flattened in systole, consistent with right ventricular pressure overload.   2. Right ventricular systolic function is mildly reduced. The right  ventricular size is normal. There is normal pulmonary artery systolic  pressure.   3. Left atrial  size was severely dilated.   4. Difficult to assess given atrial fibrillation with rapid rate but  appears MS is severe by mean gradient ( ) and by PHT (1.2-1.4cm^2).  The mitral valve is rheumatic. Trivial mitral valve regurgitation. Severe  mitral stenosis. The mean mitral  valve gradient is 11.2 mmHg.   5. The aortic valve is tricuspid. Aortic valve regurgitation is not  visualized.   6. The inferior vena cava is normal in size with <50% respiratory  variability, suggesting right atrial pressure of 8 mmHg.   FINDINGS   Left Ventricle: Left ventricular ejection fraction, by estimation, is 55  to 60%. The left ventricle has normal function. The left ventricle has no  regional wall motion abnormalities. The left ventricular internal cavity  size was small. There is no left  ventricular hypertrophy. The interventricular septum is flattened in  systole, consistent with right ventricular pressure overload.  Indeterminate due to atrial fibrillation.   Right Ventricle: The right ventricular size is normal. No increase in  right ventricular wall thickness. Right ventricular systolic function is  mildly reduced. There is normal pulmonary artery systolic pressure. The  tricuspid regurgitant velocity is 2.51  m/s, and with an assumed right atrial pressure of 3 mmHg, the estimated  right ventricular systolic pressure is 28.2 mmHg.   Left Atrium: Left atrial size was severely dilated.   Right Atrium: Right atrial size was normal in size.   Pericardium: There is no evidence of pericardial effusion.   Mitral Valve: Difficult to assess given atrial fibrillation with rapid  rate but appears MS is severe by mean gradient ( ) and by PHT  (1.2-1.4cm^2). The mitral valve is rheumatic. There is mild thickening of  the mitral valve leaflet(s). There is  moderate calcification of the posterior mitral valve leaflet(s).  Moderately decreased mobility of the mitral valve leaflets. Trivial  mitral  valve regurgitation. Severe mitral valve stenosis. MV peak gradient, 21.6  mmHg. The mean mitral valve gradient is  11.2 mmHg.   Tricuspid Valve: The tricuspid valve is normal in structure. Tricuspid  valve regurgitation is mild.  Aortic Valve: The aortic valve is tricuspid. Aortic valve regurgitation is  not visualized.   Pulmonic Valve: The pulmonic valve was grossly normal. Pulmonic valve  regurgitation is not visualized.   Aorta: The aortic root and ascending aorta are structurally normal, with  no evidence of dilitation.   Venous: The inferior vena cava is normal in size with less than 50%  respiratory variability, suggesting right atrial pressure of 8 mmHg.   IAS/Shunts: The interatrial septum was not well visualized.     LEFT VENTRICLE  PLAX 2D  LVIDd:         3.30 cm  LVIDs:         2.30 cm  LV PW:         0.80 cm  LV IVS:        0.70 cm  LVOT diam:     1.55 cm  LV SV:         33  LV SV Index:   23  LVOT Area:     1.89 cm     RIGHT VENTRICLE          IVC  RV Basal diam:  1.70 cm  IVC diam: 1.90 cm   LEFT ATRIUM             Index        RIGHT ATRIUM           Index  LA diam:        3.50 cm 2.45 cm/m   RA Area:     11.80 cm  LA Vol (A2C):   43.2 ml 30.20 ml/m  RA Volume:   23.20 ml  16.22 ml/m  LA Vol (A4C):   38.6 ml 26.98 ml/m  LA Biplane Vol: 41.9 ml 29.29 ml/m   AORTIC VALVE  LVOT Vmax:   101.23 cm/s  LVOT Vmean:  68.533 cm/s  LVOT VTI:    0.173 m    AORTA  Ao Root diam: 2.40 cm  Ao Asc diam:  2.80 cm   MITRAL VALVE              TRICUSPID VALVE  MV Area VTI:  0.70 cm    TR Peak grad:   25.2 mmHg  MV Peak grad: 21.6 mmHg   TR Vmax:        251.00 cm/s  MV Mean grad: 11.2 mmHg  MV Vmax:      2.32 m/s    SHUNTS  MV Vmean:     157.3 cm/s  Systemic VTI:  0.17 m                            Systemic Diam: 1.55 cm   TEE (09/2023) IMPRESSIONS     1. Left ventricular ejection fraction, by estimation, is 60 to 65%. The  left ventricle has  normal function. The left ventricle has no regional  wall motion abnormalities.   2. Right ventricular systolic function is normal. The right ventricular  size is normal.   3. No left atrial/left atrial appendage thrombus was detected.   4. Mitral valve mean gradient 5.8 mmHg. Mean gradient may be  underestimated in the setting of sedation and hypotension. Mitral valve  area by planimetry 1.32 cm^2. The mitral valve is rheumatic. No evidence  of mitral valve regurgitation. Moderate to  severe mitral stenosis.   5. Tricuspid valve regurgitation is mild to moderate.   6. The aortic valve is tricuspid.  Aortic valve regurgitation is not  visualized. No aortic stenosis is present.   7. The inferior vena cava is normal in size with greater than 50%  respiratory variability, suggesting right atrial pressure of 3 mmHg.   Conclusion(s)/Recommendation(s): Normal biventricular function without  evidence of hemodynamically significant valvular heart disease.   FINDINGS   Left Ventricle: Left ventricular ejection fraction, by estimation, is 60  to 65%. The left ventricle has normal function. The left ventricle has no  regional wall motion abnormalities. The left ventricular internal cavity  size was normal in size. There is   no left ventricular hypertrophy.   Right Ventricle: The right ventricular size is normal. No increase in  right ventricular wall thickness. Right ventricular systolic function is  normal.   Left Atrium: Left atrial size was normal in size. No left atrial/left  atrial appendage thrombus was detected.   Right Atrium: Right atrial size was normal in size.   Pericardium: There is no evidence of pericardial effusion.   Mitral Valve: Mitral valve mean gradient 5.8 mmHg. Mean gradient may be  underestimated in the setting of sedation and hypotension. Mitral valve  area by planimetry 1.32 cm^2. The mitral valve is rheumatic. There is  moderate thickening of the mitral valve   leaflet(s). No evidence of mitral valve regurgitation. Moderate to severe  mitral valve stenosis. MV peak gradient, 10.9 mmHg. The mean mitral valve  gradient is 5.0 mmHg.   Tricuspid Valve: The tricuspid valve is normal in structure. Tricuspid  valve regurgitation is mild to moderate. No evidence of tricuspid  stenosis.   Aortic Valve: The aortic valve is tricuspid. Aortic valve regurgitation is  not visualized. No aortic stenosis is present. Aortic valve mean gradient  measures 2.0 mmHg. Aortic valve peak gradient measures 3.3 mmHg. Aortic  valve area, by VTI measures 1.72  cm.   Pulmonic Valve: The pulmonic valve was normal in structure. Pulmonic valve  regurgitation is not visualized. No evidence of pulmonic stenosis.   Aorta: The aortic root is normal in size and structure.   Venous: The inferior vena cava is normal in size with greater than 50%  respiratory variability, suggesting right atrial pressure of 3 mmHg.   IAS/Shunts: No atrial level shunt detected by color flow Doppler.     LEFT VENTRICLE  PLAX 2D  LVOT diam:     1.70 cm  LV SV:         36  LV SV Index:   25  LVOT Area:     2.27 cm     AORTIC VALVE  AV Area (Vmax):    1.80 cm  AV Area (Vmean):   2.10 cm  AV Area (VTI):     1.72 cm  AV Vmax:           91.00 cm/s  AV Vmean:          65.300 cm/s  AV VTI:            0.210 m  AV Peak Grad:      3.3 mmHg  AV Mean Grad:      2.0 mmHg  LVOT Vmax:         72.10 cm/s  LVOT Vmean:        60.400 cm/s  LVOT VTI:          0.159 m  LVOT/AV VTI ratio: 0.76   MITRAL VALVE               TRICUSPID VALVE  MV Area (PHT): 1.80 cm    TR Peak grad:   45.4 mmHg  MV Area VTI:   0.82 cm    TR Vmax:        337.00 cm/s  MV Peak grad:  10.9 mmHg  MV Mean grad:  5.0 mmHg    SHUNTS  MV Vmax:       1.65 m/s    Systemic VTI:  0.16 m  MV Vmean:      105.2 cm/s  Systemic Diam: 1.70 cm    Recent Radiology Findings:   CATH (08/2023)  Conclusion      Prox LAD to Mid LAD  lesion is 5% stenosed.   1.  Minimal nonobstructive coronary artery disease with patent mid LAD stents. 2.  Fick cardiac output of 4.8 L/min and Fick cardiac index of 3.3 L/min/m with the following hemodynamics:             Right atrial pressure mean of 15 mmHg             Right ventricular pressure 46/21 with an end-diastolic pressure of 14 mmHg             PA pressure 47/33 with a mean of 37 mmHg             Wedge pressure mean of 23 mmHg             PVR of 2.9 Woods units             PA pulsatility index of 0.9 consistent with right ventricular dysfunction 3.  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 Pt with long standing afib and on coumadin would best be served at her age with MV mechanical replacement and consideration of TV ring annuloplasty due to moderate TR and PHTN. She will need to be bridged with lovenox for 5 days prior to surgery on 2/5. We discussed all the risks and goals and recovery from surgery and it appears they understand and wish to proceed.   I have spent 60 min in review of the records, viewing studies and in face to face with patient and in coordination of future care    Eugenio Hoes 11/08/2023 3:25 PM

## 2023-11-09 ENCOUNTER — Encounter: Payer: Self-pay | Admitting: *Deleted

## 2023-11-09 ENCOUNTER — Institutional Professional Consult (permissible substitution): Payer: Medicaid Other | Admitting: Thoracic Surgery (Cardiothoracic Vascular Surgery)

## 2023-11-09 ENCOUNTER — Encounter: Payer: Self-pay | Admitting: Thoracic Surgery (Cardiothoracic Vascular Surgery)

## 2023-11-09 ENCOUNTER — Other Ambulatory Visit: Payer: Self-pay | Admitting: *Deleted

## 2023-11-09 VITALS — BP 142/84 | HR 94 | Resp 20 | Ht 59.0 in | Wt 109.0 lb

## 2023-11-09 DIAGNOSIS — I050 Rheumatic mitral stenosis: Secondary | ICD-10-CM

## 2023-11-09 NOTE — Patient Instructions (Signed)
MV replacement with possible TV repair  Off coumadin for 5 days with bridging with lovenox

## 2023-11-10 ENCOUNTER — Telehealth: Payer: Self-pay | Admitting: *Deleted

## 2023-11-10 ENCOUNTER — Ambulatory Visit (HOSPITAL_BASED_OUTPATIENT_CLINIC_OR_DEPARTMENT_OTHER): Payer: Medicaid Other | Admitting: Pulmonary Disease

## 2023-11-10 ENCOUNTER — Encounter (HOSPITAL_BASED_OUTPATIENT_CLINIC_OR_DEPARTMENT_OTHER): Payer: Self-pay | Admitting: Pulmonary Disease

## 2023-11-10 VITALS — BP 104/62 | HR 97 | Ht 59.0 in | Wt 109.0 lb

## 2023-11-10 DIAGNOSIS — R590 Localized enlarged lymph nodes: Secondary | ICD-10-CM | POA: Diagnosis not present

## 2023-11-10 NOTE — Telephone Encounter (Signed)
Pharmacy please advise on holding Coumadin prior to MVR/TVR replacement scheduled for 11/29/2023. Thank you.

## 2023-11-10 NOTE — Telephone Encounter (Signed)
   Pre-operative Risk Assessment    Patient Name: JACK RISTAU  DOB: [birth year] MRN: [id number]   Date of last office visit: 09/29/23 Jacob Moores, NP Date of next office visit: 12/27/23 DR. BRIDGETTE CHRISTOPHER   Request for Surgical Clearance    Procedure: Mitral valve replacement/Tricuspid Valve repair    Date of Surgery:  Clearance 11/29/23                                Surgeon:  DR. Leafy Ro Surgeon's Group or Practice Name:  [facility] Phone number:  [phone] Darius Bump, RN Fax number:  [phone]   Type of Clearance Requested:   - Medical  - Pharmacy:  Hold Warfarin (Coumadin) x 5 DAYS PRIOR AND WILL NEED LOVENOX BRIDGING    Type of Anesthesia:  General    Additional requests/questions:    Elpidio Anis   11/10/2023, 8:52 AM         [facility] [address] [place], [address] [place], [place]  [address] Phone:  [phone]   Fax:  [phone]         To whom it may concern,      Dr. Leafy Ro needs clearance to hold anticoagulation (Coumadin) for a patients Mitral valve replacement/Tricuspid Valve repair scheduled for Wednesday 11/29/23.  Per Dr. Leafy Ro: "Patient would need to be off coumadin for 5 days prior to surgery and will need lovenox bridging." Will fax to [phone] for clearance as well.   - Patient: Darcel Stokley: [birth year]       Thanks so much,    Darius Bump, RN [phone]

## 2023-11-10 NOTE — Telephone Encounter (Signed)
Good morning [name],  We received a clearance request for Christy Poole who will be undergoing TVR and MVR repair.  You saw her last in clinic on 09/29/2023 and could you please comment on her clearance for upcoming procedure with [facility].  Please forward your comments to the preop clearance box.  Thanks, Robin Searing, NP

## 2023-11-10 NOTE — Progress Notes (Signed)
Subjective:   PATIENT ID: Christy Poole GENDER: female DOB: [birth year], MRN: [id number]  Chief Complaint  Patient presents with   Bronchitis    Wheezing and shob w/ambulation   Reason for Visit: Cough  Christy Poole is a 63 year old never smoker with stroke, seizures, DM2 and atrial fibrillation who presents for follow-up for cough  Initial consult She reports shortness of breath since 2018 after her stroke. In the last week she has noticed worsening shortness of breath, chest tightness and wheezing. Associated with unproductive cough sometimes. Does not uses inhalers. Sometimes has chest tightness when laying down in the last year. Denies personal history of asthma. Mother had breathing issues. Denies weight change (5lbs over 1 year), night sweats or unexplained fevers/chills.  12/22/22 Son present to provide additional history. Patient completed PFTs and discussed results on phone however has not started [facility] since last visit. Continues to have unchanged symptoms of shortness of breath, cough, chest tightness and wheezing.  03/16/23 Son present and translates with [place]. No translator present. Since our last visit she has had a seizure a month ago. She is using her inhaler but having difficulty coordinating breaths. She is productive coughing and wheezing. Also had recent URI caught by her granddaughter. Overall improved from acute illness but still has persistent cough.  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 Denies shortness of breath, cough or wheezing. Not taking Dulera.  Social History: Never smoker   Environmental exposures: Denies  Past Medical History:  Diagnosis Date   Atrial fibrillation (HCC)    Diabetes mellitus without complication (HCC)    Stroke (HCC)    Thyroid disease     Allergies  Allergen Reactions   Other Other (See Comments)    Patient is  vegetarian no meat can eat eggs     Outpatient Medications Prior to Visit  Medication Sig Dispense Refill   acetaminophen (TYLENOL) 500 MG tablet Take 500 mg by mouth every 6 (six) hours as needed for mild pain (pain score 1-3), headache or fever.     albuterol (VENTOLIN HFA) 108 (90 Base) MCG/ACT inhaler Inhale 1-2 puffs into the lungs every 4 (four) hours as needed for wheezing or shortness of breath. 1 each 2   aspirin EC (ASPIRIN LOW DOSE) 81 MG tablet TAKE 1 TABLET (81 MG TOTAL) BY MOUTH DAILY. (SWALLOW WHOLE) 90 tablet 3   atorvastatin (LIPITOR) 80 MG tablet TAKE 1 TABLET (80 MG TOTAL) BY MOUTH DAILY. 90 tablet 3   cetaphil (CETAPHIL) lotion Apply 1 Application topically daily as needed for dry skin.     furosemide (LASIX) 40 MG tablet Take 1 tablet (40 mg total) by mouth daily. 30 tablet 1   gabapentin (NEURONTIN) 100 MG capsule Take 1 capsule (100 mg total) by mouth at bedtime. 60 capsule 1   levETIRAcetam (KEPPRA) 250 MG tablet TAKE 1 TABLET (250 MG TOTAL) BY MOUTH 2 (TWO) TIMES DAILY. 60 tablet 3   levothyroxine (SYNTHROID) 50 MCG tablet TAKE 1 TABLET (50 MCG TOTAL) BY MOUTH AT BEDTIME. 90 tablet 0   metFORMIN (GLUCOPHAGE-XR) 500 MG 24 hr tablet TAKE 1 TABLET (500 MG TOTAL) BY MOUTH DAILY WITH BREAKFAST. 90 tablet 1   metoprolol succinate (TOPROL-XL) 50 MG 24 hr tablet Take 3 tablets (150 mg total) by mouth daily. (Patient taking differently: Take 50 mg by mouth 3 (three) times daily.) 90 tablet 1  mometasone-formoterol (DULERA) 100-5 MCG/ACT AERO Inhale 2 puffs into the lungs in the morning and at bedtime. 1 each 2   Spacer/Aero-Holding Chambers (OPTICHAMBER DIAMOND-LG MASK) DEVI Use as directed. 1 each 0   spironolactone (ALDACTONE) 25 MG tablet Take 0.5 tablets (12.5 mg total) by mouth daily. 30 tablet 1   warfarin (COUMADIN) 2.5 MG tablet TAKE 1-2 TABLETS DAILY OR AS PRESCRIBED BY COUMADIN CLINIC (Patient taking differently: Take 3.75-5 mg by mouth See admin instructions. Take 5 mg  by mouth every Monday and Fri, take 3.75 mg by mouth daily on all other days in the evening. Or as  directed by clinic) 60 tablet 3   No facility-administered medications prior to visit.    Review of Systems  Constitutional:  Negative for chills, diaphoresis, fever, malaise/fatigue and weight loss.  HENT:  Negative for congestion.   Respiratory:  Negative for cough, hemoptysis, sputum production, shortness of breath and wheezing.   Cardiovascular:  Negative for chest pain, palpitations and leg swelling.     Objective:   Vitals:   11/10/23 0839  BP: 104/62  Pulse: 97  SpO2: 98%  Weight: 109 lb (49.4 kg)  Height: 4\' 11"  (1.499 m)    SpO2: 98 %  Physical Exam: General: Well-appearing, no acute distress HENT: [place], AT Eyes: EOMI, no scleral icterus Respiratory: Clear to auscultation bilaterally.  No crackles, wheezing or rales Cardiovascular: RRR, -M/R/G, no JVD Extremities:-Edema,-tenderness Neuro: AAO x4, CNII-XII grossly intact Psych: Normal mood, normal affect   Data Reviewed:  Imaging: CT Chest 09/21/22 - 1.3 x 11 rounded lesion in upper AP window. Unchanged compared to 12/09/21. Mosaic attenuation of lungs PET 12/09/22 - 1.3 cm AP window lesion with no hypermetabolism. Borderline mediastinal and left hilar lymph nodes with low level hypermetabolism CT Chest 06/16/23 - Stable 1.5 node in AP window CT Chest 09/09/23 - Stable AP lymph node. Multifocal pneumonia. Mosaic attenuation. No septal thickening, subpleural reticulation, traction bronchiectasis or honeycombing present.  PFT: 11/18/22 FVC 0.94 (38%) FEV1 0.84 (44%) Ratio 83  Interpretation: Reduced FVC and FEV1. Normal ratio. Will need lung volume testing to confirm restrictive defect  Labs: CBC    Component Value Date/Time   WBC 6.5 09/29/2023 1110   WBC 12.0 (H) 09/13/2023 0218   RBC 4.98 09/29/2023 1110   RBC 5.38 (H) 09/13/2023 0218   HGB 13.7 09/29/2023 1110   HCT 43.8 09/29/2023 1110   PLT 141 (L)  09/29/2023 1110   MCV 88 09/29/2023 1110   MCH 27.5 09/29/2023 1110   MCH 28.1 09/13/2023 0218   MCHC 31.3 (L) 09/29/2023 1110   MCHC 33.4 09/13/2023 0218   RDW 13.0 09/29/2023 1110   LYMPHSABS 1.5 11/10/2022 0912   MONOABS 0.4 12/15/2021 0749   EOSABS 0.2 11/10/2022 0912   BASOSABS 0.1 11/10/2022 0912        Assessment & Plan:   Discussion: 63 year old never smoker with stroke, seizures, DM2 and atrial fibrillation who presents for follow-up for shortness of breath and enlarged mediastinal lymph node. Shortness of breath has resolved.  Previously had discussed CT scan at length regarding stable lymph node. PET/CT with no avidity in concerned lesion with mild activity in mediastinal and left hilar lymph node. Recommend serial surveillance for stability, low suspicion for malignancy at this time that would warrant invasive diagnostic evaluation.    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 No hypermetabolism on prior PET Discussed surveillance for additional year and if  stable no further imaging indicated --ORDER CT Chest with contrast (November 2025)  Chronic bronchitis - currently asymptomatic --Supportive care as needed  Health Maintenance Immunization History  Administered Date(s) Administered   Influenza, Seasonal, Injecte, Preservative Fre 09/13/2023   Influenza,inj,Quad PF,6+ Mos 11/10/2022   PNEUMOCOCCAL CONJUGATE-20 09/13/2023   CT Lung Screen - never smoker. Not qualified  Orders Placed This Encounter  Procedures   CT Chest Wo Contrast    Standing Status:   Future    Expiration Date:   08/24/2024    Scheduling Instructions:     Schedule in November 2025    Preferred imaging location?:   [facility]   No orders of the defined types were placed in this encounter.   Return in about 10 months (around 09/09/2024) for after CT scan.  I have spent a total time of 30-minutes on the day of the  appointment including chart review, data review, collecting history, coordinating care and discussing medical diagnosis and plan with the patient/family. Past medical history, allergies, medications were reviewed. Pertinent imaging, labs and tests included in this note have been reviewed and interpreted independently by me.  Aidyn Kellis Mechele Collin, MD [facility] 11/10/2023 8:56 AM  Office Number [phone]

## 2023-11-10 NOTE — Patient Instructions (Signed)
Mediastinal lymphadenopathy - stable AP lymph nodes, likely benign ~10 mm hypervascular AP window lesion. No hypermetabolism on prior PET Discussed surveillance for additional year and if stable no further imaging indicated --ORDER CT Chest with contrast (November 2025)

## 2023-11-14 NOTE — Telephone Encounter (Signed)
Patient with diagnosis of afib on warfarin for anticoagulation.    Procedure: Mitral valve replacement/Tricuspid Valve repair  Date of procedure: 11/29/23   CHA2DS2-VASc Score = 6   This indicates a 9.7% annual risk of stroke. The patient's score is based upon: CHF History: 0 HTN History: 1 Diabetes History: 1 Stroke History: 2 Vascular Disease History: 1 Age Score: 0 Gender Score: 1      CrCl 49 ml/min Platelet count 141  Per office protocol, patient can hold warfarin for 5 days prior to procedure.    Patient WILL need bridging with Lovenox (enoxaparin) around procedure.  Coumadin clinic will assist in bridging  **This guidance is not considered finalized until pre-operative APP has relayed final recommendations.**

## 2023-11-14 NOTE — Telephone Encounter (Signed)
   Patient Name: Christy Poole  DOB: 13-Jul-1961 MRN: [id number]  Primary Cardiologist: Jodelle Red, MD  Clinical pharmacists have reviewed the patient's past medical history, labs, and current medications as part of preoperative protocol coverage. The following recommendations have been made:  Patient with diagnosis of afib on warfarin for anticoagulation.     Procedure: Mitral valve replacement/Tricuspid Valve repair  Date of procedure: 11/29/23     CHA2DS2-VASc Score = 6   This indicates a 9.7% annual risk of stroke. The patient's score is based upon: CHF History: 0 HTN History: 1 Diabetes History: 1 Stroke History: 2 Vascular Disease History: 1 Age Score: 0 Gender Score: 1       CrCl 49 ml/min Platelet count 141   Per office protocol, patient can hold warfarin for 5 days prior to procedure.    Patient WILL need bridging with Lovenox (enoxaparin) around procedure.   Coumadin clinic will assist in bridging.    I will route this recommendation to the requesting party via Epic fax function and remove from pre-op pool.  Please call with questions.  Joylene Grapes, NP 11/14/2023, 12:07 PM

## 2023-11-15 ENCOUNTER — Ambulatory Visit: Payer: Medicaid Other | Attending: Cardiology

## 2023-11-15 DIAGNOSIS — Z7901 Long term (current) use of anticoagulants: Secondary | ICD-10-CM | POA: Diagnosis not present

## 2023-11-15 DIAGNOSIS — I4821 Permanent atrial fibrillation: Secondary | ICD-10-CM

## 2023-11-15 DIAGNOSIS — Z8673 Personal history of transient ischemic attack (TIA), and cerebral infarction without residual deficits: Secondary | ICD-10-CM | POA: Diagnosis not present

## 2023-11-15 LAB — POCT INR: INR: 2.1 (ref 2.0–3.0)

## 2023-11-15 MED ORDER — ENOXAPARIN SODIUM 40 MG/0.4ML IJ SOSY: 40.0000 mg | PREFILLED_SYRINGE | Freq: Two times a day (BID) | INTRAMUSCULAR | 1 refills | Status: DC

## 2023-11-15 NOTE — Patient Instructions (Addendum)
Description   Take 2 tablets today and then resume taking 1.5 tablets every day except 1 tablet of Sundays.  Stay consistent with greens each week (2 per week)  Recheck INR 1 week post procedure.  Coumadin Clinic [phone] (Goal 2.5-3.0 per Discharge Summary on 12/08/21)      1/30: Last dose of warfarin.  1/31: No warfarin or enoxaparin (Lovenox).  2/1: Inject enoxaparin 40mg  in the fatty abdominal tissue at least 2 inches from the belly button twice a day about 12 hours apart, 8am and 8pm rotate sites. No warfarin.  2/2: Inject enoxaparin in the fatty tissue every 12 hours, 8am and 8pm. No warfarin.  2/3: Inject enoxaparin in the fatty tissue every 12 hours, 8am and 8pm. No warfarin.  2/4: Inject enoxaparin in the fatty tissue in the morning at 8 am (No PM dose). No warfarin.  2/5: Procedure Day - No enoxaparin - Resume warfarin in the evening or as directed by doctor (take an extra half tablet with usual dose for 2 days then resume normal dose).  2/6: Resume enoxaparin inject in the fatty tissue every 12 hours and take warfarin  2/7: Inject enoxaparin in the fatty tissue every 12 hours and take warfarin  2/8: Inject enoxaparin in the fatty tissue every 12 hours and take warfarin  2/9: Inject enoxaparin in the fatty tissue every 12 hours and take warfarin  2/10: Inject enoxaparin in the fatty tissue every 12 hours and take warfarin  2/11: warfarin appt to check INR.

## 2023-11-21 NOTE — Progress Notes (Unsigned)
[facility] [address] [place]. [place] [address] [phone]       HOSPITAL FOLLOW UP NOTE  Christy Poole Date of Birth:  1961-02-25 Medical Record Number:  [id number]   Reason for visit: Seizure follow-up    SUBJECTIVE:   CHIEF COMPLAINT:  No chief complaint on file.   HPI:   Update 11/22/2023 JM: Patient returns for follow-up visit.  Denies any seizure activity or new stroke/TIA symptoms.  Remains on Keppra without side effects.  Remains on warfarin and atorvastatin without side effects.  At prior visit, complained of lower extremity nocturnal paresthesias and felt likely related to diabetic neuropathy and also complained of memory difficulties.  Neuropathy and dementia panel unremarkable.  MRI brain ordered but not completed.  She is currently on gabapentin 100 mg TID PRN.   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       History provided for reference purposes only Update 07/12/2023 Dr. Pearlean Brownie: She returns for follow-up after last visit 6 months ago.  She is accompanied by her son and granddaughter.  Patient continues to do well from neurovascular standpoint without recurrent stroke or TIA symptoms.  She remains on Coumadin which is tolerating well with minor bruising and no bleeding.  Last INR on 07/07/2023 was 2.0.  She is tolerating Lipitor well without muscle aches and pains and states her diabetes is under good control and last hemoglobin A1c on 05/15/2023 was 6.6.  Patient remains on Keppra which is tolerating well without side effects but she has had no episodes of staring or seizure-like events.  The patient is complaining today about paresthesias in the feet particularly at night and she has trouble sleeping because of this.  She is able to ambulate independently but her balance is not good and she has to walk slowly and uses a walker.  She had 1 fall in  the last 6 months but no injuries.  The patient has had some mild memory difficulties since her stroke but the son feels that may be getting worse.  She has become more forgetful.  She occasionally forgets to take them meds.  Family needs to remind her constantly.  She is otherwise quite independent in activities of daily living.  She continues to have mild left body paresthesia since her stroke as well as complaints of getting tired very easily.   Update 01/10/2023 Dr. Pearlean Brownie: She returns for follow-up after last visit 4 months ago.  She is accompanied by her son who provides most of the history and translate for her.  Patient has done well after starting Keppra and has not had any further episodes of staring or altered consciousness since December 2023.  She is tolerating Keppra well without side effects.  She has had no recurrent stroke or TIA symptoms.  She remains on warfarin and does bruise easily and at times forgets to take her medications.  Her INR has been quite fluctuating.  She complains of of intermittent mild headaches as well as she is having some trouble reading.  She has not yet seen an ophthalmologist for evaluation for cataracts.  She had EEG on 11 /15/2023 which was normal..  Hemoglobin A1c was 6.4.   Update 08/24/2022 Dr. Pearlean Brownie: Patient is seen for follow-up today after last visit with [name] NP .She is accompanied by her son.   They inform us about a new complaint today that she has had multiple  episodes of brief alteredLoses consciousness and some of them and tries to follow normal heart itself.  Most of the episodes he has awakened looking at her eyes and is staring and has some stiffness of the extremities this lasted about 5 to 10 minutes he is quite tired after that.  She has not been evaluated for seizures yet.  Most of these episodes occurred at night but will buckle during the day as well.  She was seen in the hospital in February for 2  episodes 1 week apart that she had lost  consciousness and subsequently has noted some increased left-sided weakness.  MRI scan of the brain on 12/09/2021 had shown old right MCA infarct but no acute abnormalities.  CT angiogram neck showing no large vessel stenosis or occlusion repeat CT scan of the head on 12/15/2021 had also shown no acute abnormalities.  EEG was not ordered.  Patient was diagnosed with rheumatic mitral stenosis and 2017 subsequently had atrial fibrillation.  He was not on anticoagulation until 2019 when she had a large right MCA stroke.  He is tolerating warfarin well with INR which has been fairly stable.  She denies bruising bleeding or other side effects.    Initial visit 01/27/2022 JM: Patient being seen for hospital follow up accompanied by her son who assists with translation and providing majority of history. Overall stable from stroke standpoint.  Denies new or reoccurring stroke/TIA symptoms.  Continued left-sided weakness and short-term memory difficulties but at baseline. Continues to work with [facility] PT. Ambulates without AD, no recent falls.  Son mentions syncopal episodes prior to her stroke in 2019 and about 25 minutes prior to recent TIA events. No additional syncopal events since that time. Also mentions headaches over the past 6-7 months, present on both sides of head and on top in pressure/band type sensation. Denies photophobia, phonophobia or N/V. Will take tylenol with resolution. Lives with son and daughter-in-law who assists with ADLs and majority of IADLs.  Remains on aspirin and warfarin, denies side effects.  Routinely monitored by Coumadin clinic and has since establish care with cardiologist Dr. Cristal Deer.  Remains on atorvastatin, denies side effects.  Blood pressure today 131/74.  No further concerns at this time.   Stroke admission 12/08/2021 [name] is a 63 y.o. 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  Noted symptoms wax and wane.  Personally reviewed hospitalization pertinent progress notes, lab work and imaging.  Evaluated by Dr. Roda Shutters for likely right brain TIA likely due to A-fib on Coumadin with lower and INR level. CTH and MR brain negative for acute stroke, noted chronic moderate size cortical/subcortical right MCA territory infarct.  CTA head/neck unremarkable.  EF 45 to 50%.  LDL 36.  A1c 6.6.  Recommend continuation of warfarin and aspirin with increased INR goal at 2.5-3.0.  Continuation of atorvastatin 80 mg daily.  She was discharged home with recommended home health PT/OT.  Return to ED 2/22 with recurrence of left facial and left arm weakness lasting 5 to 10 minutes then resolved.  Repeat CT head no acute findings.  Diagnosed with TIA.  No further work-up completed as recently completed for similar symptoms.  First passing out, about 25 min after symptoms started Similar to second time  PERTINENT IMAGING  Per hospitalization 12/08/2021 CT no acute abnormality MRI no acute infarct, chronic moderate-sized cortical/subcortical right MCA territory infarct CT head and neck unremarkable 2D Echo EF 45 to 50% LDL 36 HgbA1c 6.6    ROS:   14 system review of systems performed and negative with exception of those listed in HPI  PMH:  Past Medical History:  Diagnosis Date   Atrial fibrillation (HCC)    Diabetes mellitus without complication (HCC)    Stroke (HCC)    Thyroid disease     PSH:  Past Surgical History:  Procedure Laterality Date   RIGHT/LEFT HEART CATH AND CORONARY ANGIOGRAPHY N/A 09/07/2023   Procedure: RIGHT/LEFT HEART CATH AND CORONARY ANGIOGRAPHY;  Surgeon: Orbie Pyo, MD;  Location: [facility] INVASIVE CV LAB;  Service: Cardiovascular;  Laterality: N/A;   TRANSESOPHAGEAL ECHOCARDIOGRAM (CATH LAB) N/A  10/03/2023   Procedure: TRANSESOPHAGEAL ECHOCARDIOGRAM;  Surgeon: Chilton Si, MD;  Location: [facility] INVASIVE CV LAB;  Service: Cardiovascular;  Laterality: N/A;    Social History:  Social History   Socioeconomic History   Marital status: Widowed    Spouse name: Not on file   Number of children: Not on file   Years of education: Not on file   Highest education level: GED or equivalent  Occupational History   Not on file  Tobacco Use   Smoking status: Never    Passive exposure: Never   Smokeless tobacco: Never  Vaping Use   Vaping status: Never Used  Substance and Sexual Activity   Alcohol use: Never   Drug use: Never   Sexual activity: Never  Other Topics Concern   Not on file  Social History Narrative   Not on file   Social Drivers of Health   Financial Resource Strain: Patient Declined (10/12/2023)   Overall Financial Resource Strain (CARDIA)    Difficulty of Paying Living Expenses: Patient declined  Recent Concern: Financial Resource Strain - High Risk (08/30/2023)   Overall Financial Resource Strain (CARDIA)    Difficulty of Paying Living Expenses: Hard  Food Insecurity: Patient Declined (10/12/2023)   Hunger Vital Sign    Worried About Running Out of Food in the Last Year: Patient declined    Ran Out of Food in the Last Year: Patient declined  Recent Concern: Food Insecurity - Food Insecurity Present (08/30/2023)   Hunger Vital Sign    Worried About Programme researcher, broadcasting/film/video in the Last Year: Often true    Ran Out of Food in the Last Year: Often true  Transportation Needs: Patient Declined (10/12/2023)   PRAPARE - Administrator, Civil Service (Medical): Patient declined    Lack of Transportation (Non-Medical): Patient declined  Recent Concern: Transportation Needs - Unmet Transportation Needs (08/30/2023)   PRAPARE - Administrator, Civil Service (Medical): Yes    Lack of Transportation (Non-Medical): Yes  Physical Activity: Inactive  (08/30/2023)   Exercise Vital Sign    Days of Exercise per Week: 0 days    Minutes of Exercise per Session: 10 min  Stress: Stress Concern Present (08/30/2023)   [employer] of Occupational Health - Occupational Stress Questionnaire    Feeling of Stress : Very much  Social Connections: Socially Isolated (08/30/2023)   Social Connection and Isolation Panel [NHANES]    Frequency of Communication with Friends and Family: Once a week    Frequency of Social Gatherings with Friends and Family: Three times a week    Attends Religious Services: Never  Active Member of Clubs or Organizations: No    Attends Banker Meetings: Not on file    Marital Status: Widowed  Intimate Partner Violence: Patient Unable To Answer (09/14/2023)   Humiliation, Afraid, Rape, and Kick questionnaire    Fear of Current or Ex-Partner: Patient unable to answer    Emotionally Abused: Patient unable to answer    Physically Abused: Patient unable to answer    Sexually Abused: Patient unable to answer    Family History: No family history on file.  Medications:   Current Outpatient Medications on File Prior to Visit  Medication Sig Dispense Refill   acetaminophen (TYLENOL) 500 MG tablet Take 500 mg by mouth every 6 (six) hours as needed for mild pain (pain score 1-3), headache or fever.     albuterol (VENTOLIN HFA) 108 (90 Base) MCG/ACT inhaler Inhale 1-2 puffs into the lungs every 4 (four) hours as needed for wheezing or shortness of breath. 1 each 2   aspirin EC (ASPIRIN LOW DOSE) 81 MG tablet TAKE 1 TABLET (81 MG TOTAL) BY MOUTH DAILY. (SWALLOW WHOLE) 90 tablet 3   atorvastatin (LIPITOR) 80 MG tablet TAKE 1 TABLET (80 MG TOTAL) BY MOUTH DAILY. 90 tablet 3   cetaphil (CETAPHIL) lotion Apply 1 Application topically daily as needed for dry skin.     enoxaparin (LOVENOX) 40 MG/0.4ML injection Inject 0.4 mLs (40 mg total) into the skin every 12 (twelve) hours. 8 mL 1   furosemide (LASIX) 40 MG tablet Take  1 tablet (40 mg total) by mouth daily. 30 tablet 1   gabapentin (NEURONTIN) 100 MG capsule Take 1 capsule (100 mg total) by mouth at bedtime. 60 capsule 1   levETIRAcetam (KEPPRA) 250 MG tablet TAKE 1 TABLET (250 MG TOTAL) BY MOUTH 2 (TWO) TIMES DAILY. 60 tablet 3   levothyroxine (SYNTHROID) 50 MCG tablet TAKE 1 TABLET (50 MCG TOTAL) BY MOUTH AT BEDTIME. 90 tablet 0   metFORMIN (GLUCOPHAGE-XR) 500 MG 24 hr tablet TAKE 1 TABLET (500 MG TOTAL) BY MOUTH DAILY WITH BREAKFAST. 90 tablet 1   metoprolol succinate (TOPROL-XL) 50 MG 24 hr tablet Take 3 tablets (150 mg total) by mouth daily. (Patient taking differently: Take 50 mg by mouth 3 (three) times daily.) 90 tablet 1   mometasone-formoterol (DULERA) 100-5 MCG/ACT AERO Inhale 2 puffs into the lungs in the morning and at bedtime. 1 each 2   Spacer/Aero-Holding Chambers (OPTICHAMBER DIAMOND-LG MASK) DEVI Use as directed. 1 each 0   spironolactone (ALDACTONE) 25 MG tablet Take 0.5 tablets (12.5 mg total) by mouth daily. 30 tablet 1   warfarin (COUMADIN) 2.5 MG tablet TAKE 1-2 TABLETS DAILY OR AS PRESCRIBED BY COUMADIN CLINIC (Patient taking differently: Take 3.75-5 mg by mouth See admin instructions. Take 5 mg by mouth every Monday and Fri, take 3.75 mg by mouth daily on all other days in the evening. Or as  directed by clinic) 60 tablet 3   No current facility-administered medications on file prior to visit.    Allergies:   Allergies  Allergen Reactions   Other Other (See Comments)    Patient is vegetarian no meat can eat eggs      OBJECTIVE:  Physical Exam  There were no vitals filed for this visit.  There is no height or weight on file to calculate BMI. No results found.  General: well developed, well nourished, pleasant middle aged female, seated, in no evident distress Head: head normocephalic and atraumatic.   Neck: supple with no  carotid or supraclavicular bruits Cardiovascular: irregular rate and rhythm, no  murmurs Musculoskeletal: no deformity Skin:  no rash/petichiae Vascular:  Normal pulses all extremities   Neurologic Exam Mental Status: Awake and fully alert. limited to no english - per son, mild dysarthria chronic. Denies aphasia. Oriented to place and time. Recent memory mildly impaired and remote memory intact. Son provides majority of hx due to language barrier. Mood and affect appropriate.  Cranial Nerves: Pupils equal, briskly reactive to light. Extraocular movements full without nystagmus. Visual fields unable to test due to difficulty understanding directions, does blink to threat bilaterally. Hearing intact. Facial sensation intact. Face, tongue, palate moves normally and symmetrically.  Motor: Normal bulk and tone. Normal strength on right side. mild 4/5 left sided weakness chronic  Sensory.: intact to touch , pinprick , position and vibratory sensation.  Coordination: Rapid alternating movements normal in all extremities except slightly decreased left hand. Finger-to-nose and heel-to-shin performed accurately bilaterally. Gait and Station: Arises from chair without difficulty. Stance is normal. Gait demonstrates normal stride length and with mild imbalance without use of AD. Unable to completetandem walk and heel toe.  Reflexes: 1+ and symmetric. Toes downgoing.        ASSESSMENT: Christy Poole is a 63 y.o. year old female right brain TIA on 12/08/2021 and 12/15/2021 likely due to A-fib on Coumadin with lower and INR level. Vascular risk factors include history of right MCA stroke with residual left-sided weakness and facial weakness, hx of rheumatic mitral valve stenosis and A-fib on Coumadin, CAD s/p stent 2016, HLD and new dx of DM. 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  Memory loss and mild cognitive difficulties poststroke likely due to mild cognitive impairment as well.     PLAN:  Seizures, late effect of stroke: No additional seizure activity Continue Keppra 250 mg twice daily Advised to call with any seizure activity TIA:  Hx of R MCA stroke Residual deficit: Left-sided weakness and short term memory loss currently at baseline. Continue working with therapies. Discussed memory exercises at home.  Continue aspirin 81 mg daily and warfarin daily  and atorvastatin 80 mg daily for secondary stroke prevention, CAD s/p stent, rheumatic mitral valve stenosis and A-fib.   Discussed secondary stroke prevention measures and importance of close PCP follow up for aggressive stroke risk factor management including BP goal<130/90, HLD with LDL goal<70 and DM with A1c.<7 I have gone over the pathophysiology of stroke, warning signs and symptoms, risk factors and their management in some detail with instructions to go to the closest emergency room for symptoms of concern. Stroke labs 11/2021: LDL 36, A1c 6.6    Doing well from stroke standpoint without further recommendations and risk factors are managed by PCP/cardiology. She may follow up PRN, as usual for our patients who are strictly being followed for stroke. If any new neurological issues should arise, request PCP place referral for evaluation by one of our neurologists. Thank you.     CC:  [facility] provider: Dr. Pearlean Brownie PCP: [place], Buren Kos, MD    I spent *** minutes of face-to-face and non-face-to-face time with patient.  This included previsit chart review, lab review, study review, order entry,  electronic health record documentation, patient education and discussion regarding above diagnoses and treatment plan and answered all other questions to patient's  satisfaction    Ihor Austin, [facility]  [facility] [address] [place], [place] [address]  Phone [phone] Fax [phone] Note: This document was prepared with digital dictation and possible smart phrase technology. Any transcriptional errors that result from this process are unintentional.

## 2023-11-22 ENCOUNTER — Ambulatory Visit (INDEPENDENT_AMBULATORY_CARE_PROVIDER_SITE_OTHER): Payer: Medicaid Other | Admitting: Adult Health

## 2023-11-22 ENCOUNTER — Encounter: Payer: Self-pay | Admitting: Adult Health

## 2023-11-22 VITALS — BP 119/69 | HR 85 | Ht 59.0 in | Wt 107.6 lb

## 2023-11-22 DIAGNOSIS — Z8673 Personal history of transient ischemic attack (TIA), and cerebral infarction without residual deficits: Secondary | ICD-10-CM | POA: Diagnosis not present

## 2023-11-22 DIAGNOSIS — G3184 Mild cognitive impairment, so stated: Secondary | ICD-10-CM | POA: Diagnosis not present

## 2023-11-22 DIAGNOSIS — G459 Transient cerebral ischemic attack, unspecified: Secondary | ICD-10-CM | POA: Diagnosis not present

## 2023-11-22 DIAGNOSIS — R569 Unspecified convulsions: Secondary | ICD-10-CM | POA: Diagnosis not present

## 2023-11-22 DIAGNOSIS — G629 Polyneuropathy, unspecified: Secondary | ICD-10-CM

## 2023-11-22 MED ORDER — LEVETIRACETAM 250 MG PO TABS: 250.0000 mg | ORAL_TABLET | Freq: Two times a day (BID) | ORAL | 3 refills | Status: DC

## 2023-11-22 NOTE — Patient Instructions (Addendum)
Your Plan:  Continue Keppra 250 mg twice daily for seizure prevention  Please call with any seizure activity  Continue warfarin and atorvastatin and ensure close PCP and cardiology follow-up for aggressive stroke risk factor management  Continue gabapentin 100mg  three times daily as needed for neuropathy   Please ensure you are doing routine relaxation exercises and routine memory exercises to help with your memory     Follow-up in 6 months or call earlier if needed     Thank you for coming to see [place] at [facility]. I hope we have been able to provide you high quality care today.  You may receive a patient satisfaction survey over the next few weeks. We would appreciate your feedback and comments so that we may continue to improve ourselves and the health of our patients.

## 2023-11-23 ENCOUNTER — Encounter (HOSPITAL_BASED_OUTPATIENT_CLINIC_OR_DEPARTMENT_OTHER): Payer: Self-pay

## 2023-11-23 NOTE — Assessment & Plan Note (Signed)
Has had evaluation with cardiology related to this with plan for procedure upcoming.  Recommend continued close follow-up with cardiology and proceeding with recommended procedure.

## 2023-11-23 NOTE — Assessment & Plan Note (Signed)
She has concerns with feelings of fatigue and weakness.  She was discharged from the hospital few days ago and was admitted for over 1 week.  She did receive physical therapy while in the hospital and recommendation from inpatient physical therapy services were for continued PT due to her weakness and deconditioning.  Discussed with patient and son that current symptoms are most likely related to recent hospitalization and deconditioning which occurred during her stay in the hospital.  As a result it would be expected for it to take some time before noticing returned to prior level of activity and strength.  Unfortunately, this is likely to be further complicated by planned upcoming procedure interventions with cardiology.  Given symptoms we can check labs to exclude alternative causes of symptoms.  Ultimately, would recommend continuing to work with physical therapy.  Son believes that this was arranged through hospital prior to discharge and we will look further into this.  Advised to let us know if referral is needed and we can assist with arranging for physical therapy services

## 2023-11-24 ENCOUNTER — Encounter (HOSPITAL_COMMUNITY): Payer: Self-pay

## 2023-11-24 NOTE — Progress Notes (Signed)
Surgical Instructions    Your procedure is scheduled on Wednesday, 11/29/23.Marland Kitchen  Report to [facility] Main Entrance "A" at 6:30 A.M., then check in with the Admitting office.  Call this number if you have problems the morning of surgery:  [phone]   If you have any questions prior to your surgery date call [phone]: Open Monday-Friday 8am-4pm If you experience any cold or flu symptoms such as cough, fever, chills, shortness of breath, etc. between now and your scheduled surgery, please notify us at the above number     Remember:  Do not eat or drink after midnight the night before your surgery-Tuesday.     Take these medicines the morning of surgery with A SIP OF WATER:  aspirin EC (ASPIRIN LOW DOSE)  atorvastatin (LIPITOR)  levETIRAcetam (KEPPRA)  metoprolol succinate  mometasone-formoterol (DULERA) Inhaler   If NEEDED: acetaminophen (TYLENOL)  albuterol (VENTOLIN HFA) inhaler   As of today, STOP taking Aleve, Naproxen, Ibuprofen, Motrin, Advil, Goody's, BC's, all herbal medications, fish oil, and all vitamins.           Do not wear jewelry Do not wear lotions, powders, cologne or deodorant. Men may shave face and neck. Do not bring valuables to the hospital.   [facility] is not responsible for any belongings or valuables.     Contacts, glasses, hearing aids, dentures or partials may not be worn into surgery, please bring cases for these belongings   For patients admitted to the hospital, discharge time will be determined by your treatment team.    SURGICAL WAITING ROOM VISITATION Patients having surgery or a procedure may have no more than 2 support people in the waiting area - these visitors may rotate.   Children under the age of 26 must have an adult with them who is not the patient. If the patient needs to stay at the hospital during part of their recovery, the visitor guidelines for inpatient rooms apply. Pre-op nurse will coordinate an appropriate time  for 1 support person to accompany patient in pre-op.  This support person may not rotate.   Please refer to [email or web address] for the visitor guidelines for Inpatients (after your surgery is over and you are in a regular room).    Special instructions:    Oral Hygiene is also important to reduce your risk of infection.  Remember - BRUSH YOUR TEETH THE MORNING OF SURGERY WITH YOUR REGULAR TOOTHPASTE   [place]- Preparing For Surgery  Before surgery, you can play an important role. Because skin is not sterile, your skin needs to be as free of germs as possible. You can reduce the number of germs on your skin by washing with CHG (chlorahexidine gluconate) Soap before surgery.  CHG is an antiseptic cleaner which kills germs and bonds with the skin to continue killing germs even after washing.     Please do not use if you have an allergy to CHG or antibacterial soaps. If your skin becomes reddened/irritated stop using the CHG.  Do not shave (including legs and underarms) for at least 48 hours prior to first CHG shower. It is OK to shave your face.  Please follow these instructions carefully.     Shower the NIGHT BEFORE SURGERY-Tues and the MORNING OF SURGERY-Wed with CHG Soap.   If you chose to wash your hair, wash your hair first as usual with your normal shampoo. After you shampoo, rinse your hair and body thoroughly to remove the shampoo.  Then [employer] and genitals (  private parts) with your normal soap and rinse thoroughly to remove soap.  After that Use CHG Soap as you would any other liquid soap. You can apply CHG directly to the skin and wash gently with a scrungie or a clean washcloth.   Apply the CHG Soap to your body ONLY FROM THE NECK DOWN.  Do not use on open wounds or open sores. Avoid contact with your eyes, ears, mouth and genitals (private parts). Wash Face and genitals (private parts)  with your normal soap.   Wash  thoroughly, paying special attention to the area where your surgery will be performed.  Thoroughly rinse your body with warm water from the neck down.  DO NOT shower/wash with your normal soap after using and rinsing off the CHG Soap.  Pat yourself dry with a CLEAN TOWEL.  Wear CLEAN PAJAMAS to bed the night before surgery  Place CLEAN SHEETS on your bed the night before your surgery  DO NOT SLEEP WITH PETS.   Day of Surgery:  Take a shower with CHG soap. Wear Clean/Comfortable clothing the morning of surgery Do not apply any deodorants/lotions.   Remember to brush your teeth WITH YOUR REGULAR TOOTHPASTE.    If you received a COVID test during your pre-op visit, it is requested that you wear a mask when out in public, stay away from anyone that may not be feeling well, and notify your surgeon if you develop symptoms. If you have been in contact with anyone that has tested positive in the last 10 days, please notify your surgeon.    Please read over the following fact sheets that you were given.

## 2023-11-24 NOTE — Progress Notes (Signed)
Son, Lexianna Weinrich interpreted at PAT appt.  Waiver for free [employer] was signed by patient and placed on Chart.    Dondra Spry informed to arrange an interpreter for DOS,  Gujarati Language.  PCP - Charlesetta Garibaldi D. Shelva Majestic, NP  Cardiologist - Dr Jodelle Red   Chest x-ray - 11/27/23 EKG - 11/27/23 Stress Test - n/a ECHO TEE - 10/03/23 Cardiac Cath - 09/07/23  ICD Pacemaker/Loop - n/a  Sleep Study -  n/a  Diabetes Type 2 Fasting Blood Sugar -  90s-100s Checks Blood Sugar once a week   Do not take Metformin on the morning of surgery.  If your blood sugar is less than 70 mg/dL, you will need to treat for low blood sugar: Treat a low blood sugar (less than 70 mg/dL) with  cup of clear juice (cranberry or apple), 4 glucose tablets, OR glucose gel. Recheck blood sugar in 15 minutes after treatment (to make sure it is greater than 70 mg/dL). If your blood sugar is not greater than 70 mg/dL on recheck, call [phone] for further instructions.  Blood Thinner Instructions:  Hold Coumadin for 5 days prior to procedure .  Last dose was on 11/23/23.   Aspirin Instructions: Last dosw of Aspirin was 11/23/23.  Follow your surgeon's Lovenox instructions prior to surgery.  If no instructions were given by your surgeon then you will need to call the office for those instructions. Patient has instructions from MD, no Lovenox on morning or surgery.  NPO   Anesthesia review: Yes  STOP now taking any Aspirin (unless otherwise instructed by your surgeon), Aleve, Naproxen, Ibuprofen, Motrin, Advil, Goody's, BC's, all herbal medications, fish oil, and all vitamins.   Coronavirus Screening Do you have any of the following symptoms:  Cough yes/no: No Fever (>100.22F)  yes/no: No Runny nose yes/no: No Sore throat yes/no: No Difficulty breathing/shortness of breath  yes/no: No  Have you traveled in the last 14 days and where? yes/no: No  Patient verbalized understanding of instructions that were  given to them at the PAT appointment. Patient was also instructed that they will need to review over the PAT instructions again at home before surgery.

## 2023-11-27 ENCOUNTER — Other Ambulatory Visit: Payer: Self-pay

## 2023-11-27 ENCOUNTER — Ambulatory Visit (HOSPITAL_COMMUNITY)
Admission: RE | Admit: 2023-11-27 | Discharge: 2023-11-27 | Disposition: A | Discharge status: Home / Self Care | Payer: Medicaid Other | Source: Ambulatory Visit | Attending: Thoracic Surgery (Cardiothoracic Vascular Surgery) | Admitting: Thoracic Surgery (Cardiothoracic Vascular Surgery)

## 2023-11-27 ENCOUNTER — Encounter (HOSPITAL_COMMUNITY): Payer: Self-pay

## 2023-11-27 ENCOUNTER — Encounter (HOSPITAL_COMMUNITY)
Admission: RE | Admit: 2023-11-27 | Discharge: 2023-11-27 | Disposition: A | Discharge status: Home / Self Care | Payer: Medicaid Other | Source: Ambulatory Visit | Attending: Thoracic Surgery (Cardiothoracic Vascular Surgery) | Admitting: Thoracic Surgery (Cardiothoracic Vascular Surgery)

## 2023-11-27 VITALS — BP 132/72 | HR 63 | Temp 98.2°F | Resp 18 | Ht 59.0 in | Wt 106.4 lb

## 2023-11-27 DIAGNOSIS — R413 Other amnesia: Secondary | ICD-10-CM | POA: Insufficient documentation

## 2023-11-27 DIAGNOSIS — I4891 Unspecified atrial fibrillation: Secondary | ICD-10-CM | POA: Diagnosis not present

## 2023-11-27 DIAGNOSIS — E785 Hyperlipidemia, unspecified: Secondary | ICD-10-CM | POA: Insufficient documentation

## 2023-11-27 DIAGNOSIS — I050 Rheumatic mitral stenosis: Secondary | ICD-10-CM

## 2023-11-27 DIAGNOSIS — I2510 Atherosclerotic heart disease of native coronary artery without angina pectoris: Secondary | ICD-10-CM | POA: Diagnosis not present

## 2023-11-27 DIAGNOSIS — I081 Rheumatic disorders of both mitral and tricuspid valves: Secondary | ICD-10-CM | POA: Diagnosis not present

## 2023-11-27 DIAGNOSIS — Z955 Presence of coronary angioplasty implant and graft: Secondary | ICD-10-CM | POA: Diagnosis not present

## 2023-11-27 DIAGNOSIS — Z8673 Personal history of transient ischemic attack (TIA), and cerebral infarction without residual deficits: Secondary | ICD-10-CM | POA: Diagnosis not present

## 2023-11-27 DIAGNOSIS — R0600 Dyspnea, unspecified: Secondary | ICD-10-CM | POA: Diagnosis not present

## 2023-11-27 DIAGNOSIS — E119 Type 2 diabetes mellitus without complications: Secondary | ICD-10-CM | POA: Insufficient documentation

## 2023-11-27 DIAGNOSIS — I10 Essential (primary) hypertension: Secondary | ICD-10-CM | POA: Diagnosis not present

## 2023-11-27 DIAGNOSIS — Z01818 Encounter for other preprocedural examination: Secondary | ICD-10-CM | POA: Insufficient documentation

## 2023-11-27 HISTORY — DX: Dyspnea, unspecified: R06.00

## 2023-11-27 HISTORY — DX: Hyperlipidemia, unspecified: E78.5

## 2023-11-27 HISTORY — DX: Myoneural disorder, unspecified: G70.9

## 2023-11-27 HISTORY — DX: Unspecified convulsions: R56.9

## 2023-11-27 HISTORY — DX: Headache, unspecified: R51.9

## 2023-11-27 HISTORY — DX: Essential (primary) hypertension: I10

## 2023-11-27 HISTORY — DX: Other amnesia: R41.3

## 2023-11-27 HISTORY — DX: Dependence on other enabling machines and devices: Z99.89

## 2023-11-27 HISTORY — DX: Pneumonia, unspecified organism: J18.9

## 2023-11-27 HISTORY — DX: Atherosclerotic heart disease of native coronary artery without angina pectoris: I25.10

## 2023-11-27 LAB — HEMOGLOBIN A1C
Hgb A1c MFr Bld: 5.8 % — ABNORMAL HIGH (ref 4.8–5.6)
Mean Plasma Glucose: 119.76 mg/dL

## 2023-11-27 LAB — COMPREHENSIVE METABOLIC PANEL
ALT: 30 U/L (ref 0–44)
AST: 36 U/L (ref 15–41)
Albumin: 3.9 g/dL (ref 3.5–5.0)
Alkaline Phosphatase: 64 U/L (ref 38–126)
Anion gap: 10 (ref 5–15)
BUN: 12 mg/dL (ref 8–23)
CO2: 25 mmol/L (ref 22–32)
Calcium: 10.4 mg/dL — ABNORMAL HIGH (ref 8.9–10.3)
Chloride: 108 mmol/L (ref 98–111)
Creatinine, Ser: 1.03 mg/dL — ABNORMAL HIGH (ref 0.44–1.00)
GFR, Estimated: >60 mL/min (ref >60)
Glucose, Bld: 103 mg/dL — ABNORMAL HIGH (ref 70–99)
Potassium: 4.8 mmol/L (ref 3.5–5.1)
Sodium: 143 mmol/L (ref 135–145)
Total Bilirubin: 1.3 mg/dL — ABNORMAL HIGH (ref 0.0–1.2)
Total Protein: 7.1 g/dL (ref 6.5–8.1)

## 2023-11-27 LAB — CBC
HCT: 40.1 % (ref 36.0–46.0)
Hemoglobin: 12.9 g/dL (ref 12.0–15.0)
MCH: 28.7 pg (ref 26.0–34.0)
MCHC: 32.2 g/dL (ref 30.0–36.0)
MCV: 89.1 fL (ref 80.0–100.0)
Platelets: 166 10*3/uL (ref 150–400)
RBC: 4.5 MIL/uL (ref 3.87–5.11)
RDW: 14.1 % (ref 11.5–15.5)
WBC: 5.4 10*3/uL (ref 4.0–10.5)
nRBC: 0 % (ref 0.0–0.2)

## 2023-11-27 LAB — URINALYSIS, ROUTINE W REFLEX MICROSCOPIC
Bilirubin Urine: NEGATIVE
Glucose, UA: NEGATIVE mg/dL
Hgb urine dipstick: NEGATIVE
Ketones, ur: NEGATIVE mg/dL
Leukocytes,Ua: NEGATIVE
Nitrite: NEGATIVE
Protein, ur: NEGATIVE mg/dL
Specific Gravity, Urine: 1.009 (ref 1.005–1.030)
pH: 6 (ref 5.0–8.0)

## 2023-11-27 LAB — GLUCOSE, CAPILLARY: Glucose-Capillary: 121 mg/dL — ABNORMAL HIGH (ref 70–99)

## 2023-11-27 LAB — APTT: aPTT: 44 s — ABNORMAL HIGH (ref 24–36)

## 2023-11-27 LAB — SURGICAL PCR SCREEN
MRSA, PCR: NEGATIVE
Staphylococcus aureus: NEGATIVE

## 2023-11-27 LAB — PROTIME-INR
INR: 1.4 — ABNORMAL HIGH (ref 0.8–1.2)
Prothrombin Time: 17.4 s — ABNORMAL HIGH (ref 11.4–15.2)

## 2023-11-28 ENCOUNTER — Encounter (HOSPITAL_COMMUNITY): Payer: Self-pay

## 2023-11-28 MED ORDER — MANNITOL 20 % IV SOLN
INTRAVENOUS | Status: DC
  Filled 2023-11-28: qty 13

## 2023-11-28 MED ORDER — TRANEXAMIC ACID (OHS) PUMP PRIME SOLUTION
2.0000 mg/kg | INTRAVENOUS | Status: DC
  Filled 2023-11-28: qty 0.97

## 2023-11-28 MED ORDER — TRANEXAMIC ACID (OHS) BOLUS VIA INFUSION
15.0000 mg/kg | INTRAVENOUS | Status: AC
  Administered 2023-11-29: 724.5 mg via INTRAVENOUS
  Filled 2023-11-28: qty 725

## 2023-11-28 MED ORDER — CEFAZOLIN SODIUM-DEXTROSE 2-4 GM/100ML-% IV SOLN
2.0000 g | INTRAVENOUS | Status: AC
  Administered 2023-11-29: 2 g via INTRAVENOUS
  Filled 2023-11-28: qty 100

## 2023-11-28 MED ORDER — MILRINONE LACTATE IN DEXTROSE 20-5 MG/100ML-% IV SOLN
0.3000 ug/kg/min | INTRAVENOUS | Status: DC
  Filled 2023-11-28: qty 100

## 2023-11-28 MED ORDER — POTASSIUM CHLORIDE 2 MEQ/ML IV SOLN
80.0000 meq | INTRAVENOUS | Status: DC
  Filled 2023-11-28: qty 40

## 2023-11-28 MED ORDER — NOREPINEPHRINE 4 MG/250ML-% IV SOLN
0.0000 ug/min | INTRAVENOUS | Status: DC
  Filled 2023-11-28: qty 250

## 2023-11-28 MED ORDER — PLASMA-LYTE A IV SOLN
INTRAVENOUS | Status: DC
  Filled 2023-11-28: qty 2.5

## 2023-11-28 MED ORDER — SODIUM CHLORIDE 0.9 % IV SOLN
INTRAVENOUS | Status: DC
  Filled 2023-11-28: qty 20

## 2023-11-28 MED ORDER — DEXMEDETOMIDINE HCL IN NACL 400 MCG/100ML IV SOLN
0.1000 ug/kg/h | INTRAVENOUS | Status: AC
  Administered 2023-11-29: .4 ug/kg/h via INTRAVENOUS
  Filled 2023-11-28: qty 100

## 2023-11-28 MED ORDER — NITROGLYCERIN IN D5W 200-5 MCG/ML-% IV SOLN
2.0000 ug/min | INTRAVENOUS | Status: DC
  Filled 2023-11-28: qty 250

## 2023-11-28 MED ORDER — PHENYLEPHRINE HCL-NACL 20-0.9 MG/250ML-% IV SOLN
30.0000 ug/min | INTRAVENOUS | Status: AC
  Administered 2023-11-29: 20 ug/min via INTRAVENOUS
  Filled 2023-11-28: qty 250

## 2023-11-28 MED ORDER — TRANEXAMIC ACID 1000 MG/10ML IV SOLN
1.5000 mg/kg/h | INTRAVENOUS | Status: AC
  Administered 2023-11-29: 1.5 mg/kg/h via INTRAVENOUS
  Filled 2023-11-28: qty 25

## 2023-11-28 MED ORDER — INSULIN REGULAR(HUMAN) IN NACL 100-0.9 UT/100ML-% IV SOLN
INTRAVENOUS | Status: AC
  Administered 2023-11-29: 1.3 [IU]/h via INTRAVENOUS
  Filled 2023-11-28: qty 100

## 2023-11-28 MED ORDER — EPINEPHRINE HCL 5 MG/250ML IV SOLN IN NS
0.0000 ug/min | INTRAVENOUS | Status: DC
  Filled 2023-11-28: qty 250

## 2023-11-28 MED ORDER — VANCOMYCIN HCL IN DEXTROSE 1-5 GM/200ML-% IV SOLN
1000.0000 mg | INTRAVENOUS | Status: AC
  Administered 2023-11-29: 1000 mg via INTRAVENOUS
  Filled 2023-11-28: qty 200

## 2023-11-28 MED ORDER — HEPARIN 30,000 UNITS/1000 ML (OHS) CELLSAVER SOLUTION
Status: DC
  Filled 2023-11-28: qty 1000

## 2023-11-28 NOTE — H&P (Signed)
 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 Pt was admitted with bronchopneumonia and on work up was found to have MS with a mean gradient of 11 mmHG and normal LV function. No significant MR. Pt with long history of afib on coumadin  and had previous LAD stenting and MV balloon valvuloplasty in [place] in 2018. Pt has suffered a stroke in the past. She currently has DOE with walking stairs and also occasional rest CP. She had cath without CAD and moderate PHTN. Pt was felt best to be served with MV replacement and has been sent for consideration. Her son was serving as interpreter and called his uncle in [place] who is an intensivist. We discussed her issues over the phone and he then discussed this with her son and herself. PT is very nervous about surgery and became tearful             Past Medical History:  Diagnosis Date   Atrial fibrillation (HCC)     Diabetes mellitus without complication (HCC)     Stroke [facility])     Thyroid disease                 Past Surgical History:  Procedure Laterality Date   RIGHT/LEFT HEART CATH AND CORONARY ANGIOGRAPHY N/A 09/07/2023    Procedure: RIGHT/LEFT HEART CATH AND CORONARY ANGIOGRAPHY;  Surgeon: Wendel Lurena POUR, MD;  Location: [facility] INVASIVE CV LAB;  Service: Cardiovascular;  Laterality: N/A;   TRANSESOPHAGEAL ECHOCARDIOGRAM (CATH LAB) N/A 10/03/2023    Procedure: TRANSESOPHAGEAL ECHOCARDIOGRAM;  Surgeon: Raford Riggs, MD;  Location: [facility] INVASIVE CV LAB;  Service: Cardiovascular;  Laterality: N/A;          Tobacco Use History  Social History        Tobacco Use  Smoking Status Never    Passive exposure: Never  Smokeless Tobacco Never      Social History       Substance and Sexual Activity  Alcohol Use Never      Social History         Socioeconomic History   Marital status: Widowed      Spouse name: Not on file   Number of children: Not on file   Years of education: Not on file   Highest education level: GED or equivalent  Occupational History   Not on file  Tobacco Use   Smoking status: Never      Passive exposure: Never   Smokeless tobacco: Never  Vaping Use   Vaping status: Never Used  Substance and Sexual Activity   Alcohol use: Never   Drug use: Never   Sexual activity: Never  Other Topics Concern   Not on file  Social History Narrative   Not on file    Social Drivers of Health        Financial Resource Strain:  Patient Declined (10/12/2023)    Overall Financial Resource Strain (CARDIA)     Difficulty of Paying Living Expenses: Patient declined  Recent Concern: Financial Resource Strain - High Risk (08/30/2023)    Overall Financial Resource Strain (CARDIA)     Difficulty of Paying Living Expenses: Hard  Food Insecurity: Patient Declined (10/12/2023)    Hunger Vital Sign     Worried About Programme Researcher, Broadcasting/film/video in the Last Year: Patient declined     Ran Out of Food in the Last Year: Patient declined  Recent Concern: Food Insecurity - Food Insecurity Present (08/30/2023)    Hunger Vital Sign     Worried About Programme Researcher, Broadcasting/film/video in the Last Year: Often true     Ran Out of Food in the Last Year: Often true  Transportation Needs: Patient Declined (10/12/2023)    PRAPARE - Therapist, Art (Medical): Patient declined     Lack of Transportation (Non-Medical): Patient declined  Recent Concern: Transportation Needs - Unmet Transportation Needs (08/30/2023)    PRAPARE - Therapist, Art (Medical): Yes     Lack of Transportation (Non-Medical): Yes  Physical Activity: Inactive (08/30/2023)     Exercise Vital Sign     Days of Exercise per Week: 0 days     Minutes of Exercise per Session: 10 min  Stress: Stress Concern Present (08/30/2023)    [employer] of Occupational Health - Occupational Stress Questionnaire     Feeling of Stress : Very much  Social Connections: Socially Isolated (08/30/2023)    Social Connection and Isolation Panel [NHANES]     Frequency of Communication with Friends and Family: Once a week     Frequency of Social Gatherings with Friends and Family: Three times a week     Attends Religious Services: Never     Active Member of Clubs or Organizations: No     Attends Banker Meetings: Not on file     Marital Status: Widowed  Intimate Partner Violence: Patient Unable To Answer (09/14/2023)    Humiliation, Afraid, Rape, and Kick questionnaire     Fear of Current or Ex-Partner: Patient unable to answer     Emotionally Abused: Patient unable to answer     Physically Abused: Patient unable to answer     Sexually Abused: Patient unable to answer      Allergies       Allergies  Allergen Reactions   Other Other (See Comments)      Patient is vegetarian no meat can eat eggs              Current Outpatient Medications  Medication Sig Dispense Refill   acetaminophen  (TYLENOL ) 500 MG tablet Take 500 mg by mouth every 6 (six) hours as needed for mild pain (pain score 1-3), headache or fever.       albuterol  (VENTOLIN  HFA) 108 (90 Base) MCG/ACT inhaler Inhale 1-2 puffs into the lungs every 4 (four) hours as needed for wheezing or shortness of breath. 1 each 2   aspirin  EC (ASPIRIN  LOW DOSE) 81 MG tablet TAKE 1 TABLET (81 MG TOTAL) BY MOUTH DAILY. (SWALLOW WHOLE) 90 tablet 3   atorvastatin  (LIPITOR ) 80 MG tablet TAKE 1 TABLET (80 MG TOTAL) BY MOUTH DAILY. 90 tablet 3   cetaphil (CETAPHIL) lotion Apply 1 Application topically daily as needed for dry skin.       furosemide  (LASIX ) 40 MG tablet Take 1 tablet (  40 mg total) by mouth daily. 30 tablet 1    gabapentin  (NEURONTIN ) 100 MG capsule Take 1 capsule (100 mg total) by mouth at bedtime. 60 capsule 1   levETIRAcetam  (KEPPRA ) 250 MG tablet TAKE 1 TABLET (250 MG TOTAL) BY MOUTH 2 (TWO) TIMES DAILY. 60 tablet 3   levothyroxine  (SYNTHROID ) 50 MCG tablet TAKE 1 TABLET (50 MCG TOTAL) BY MOUTH AT BEDTIME. 90 tablet 0   metFORMIN  (GLUCOPHAGE -XR) 500 MG 24 hr tablet TAKE 1 TABLET (500 MG TOTAL) BY MOUTH DAILY WITH BREAKFAST. 90 tablet 1   metoprolol  succinate (TOPROL -XL) 50 MG 24 hr tablet Take 3 tablets (150 mg total) by mouth daily. (Patient taking differently: Take 50 mg by mouth 3 (three) times daily.) 90 tablet 1   mometasone -formoterol  (DULERA ) 100-5 MCG/ACT AERO Inhale 2 puffs into the lungs in the morning and at bedtime. 1 each 2   Spacer/Aero-Holding Chambers (OPTICHAMBER DIAMOND -LG MASK) DEVI Use as directed. 1 each 0   spironolactone  (ALDACTONE ) 25 MG tablet Take 0.5 tablets (12.5 mg total) by mouth daily. 30 tablet 1   warfarin (COUMADIN ) 2.5 MG tablet TAKE 1-2 TABLETS DAILY OR AS PRESCRIBED BY COUMADIN  CLINIC (Patient taking differently: Take 3.75-5 mg by mouth See admin instructions. Take 5 mg by mouth every Monday and Fri, take 3.75 mg by mouth daily on all other days in the evening. Or as  directed by clinic) 60 tablet 3      No current facility-administered medications for this visit.          No family history on file.             Physical Exam: Lungs: clear Card: irr with 2/6 diastolic murmur Ext: no edema Neuro: appears intact         Diagnostic Studies & Laboratory data: I have personally reviewed the following studies and agree with the findings   TTE(08/2023) IMPRESSIONS     1. Left ventricular ejection fraction, by estimation, is 55 to 60%. The  left ventricle has normal function. The left ventricle has no regional  wall motion abnormalities. indeterminate due to atrial fibrillation. There  is the interventricular septum is  flattened in systole,  consistent with right ventricular pressure overload.   2. Right ventricular systolic function is mildly reduced. The right  ventricular size is normal. There is normal pulmonary artery systolic  pressure.   3. Left atrial size was severely dilated.   4. Difficult to assess given atrial fibrillation with rapid rate but  appears MS is severe by mean gradient ( ) and by PHT (1.2-1.4cm^2).  The mitral valve is rheumatic. Trivial mitral valve regurgitation. Severe  mitral stenosis. The mean mitral  valve gradient is 11.2 mmHg.   5. The aortic valve is tricuspid. Aortic valve regurgitation is not  visualized.   6. The inferior vena cava is normal in size with <50% respiratory  variability, suggesting right atrial pressure of 8 mmHg.   FINDINGS   Left Ventricle: Left ventricular ejection fraction, by estimation, is 55  to 60%. The left ventricle has normal function. The left ventricle has no  regional wall motion abnormalities. The left ventricular internal cavity  size was small. There is no left  ventricular hypertrophy. The interventricular septum is flattened in  systole, consistent with right ventricular pressure overload.  Indeterminate due to atrial fibrillation.   Right Ventricle: The right ventricular size is normal. No increase in  right ventricular wall thickness. Right ventricular systolic function is  mildly reduced. There is  normal pulmonary artery systolic pressure. The  tricuspid regurgitant velocity is 2.51  m/s, and with an assumed right atrial pressure of 3 mmHg, the estimated  right ventricular systolic pressure is 28.2 mmHg.   Left Atrium: Left atrial size was severely dilated.   Right Atrium: Right atrial size was normal in size.   Pericardium: There is no evidence of pericardial effusion.   Mitral Valve: Difficult to assess given atrial fibrillation with rapid  rate but appears MS is severe by mean gradient ( ) and by PHT  (1.2-1.4cm^2). The mitral  valve is rheumatic. There is mild thickening of  the mitral valve leaflet(s). There is  moderate calcification of the posterior mitral valve leaflet(s).  Moderately decreased mobility of the mitral valve leaflets. Trivial mitral  valve regurgitation. Severe mitral valve stenosis. MV peak gradient, 21.6  mmHg. The mean mitral valve gradient is  11.2 mmHg.   Tricuspid Valve: The tricuspid valve is normal in structure. Tricuspid  valve regurgitation is mild.   Aortic Valve: The aortic valve is tricuspid. Aortic valve regurgitation is  not visualized.   Pulmonic Valve: The pulmonic valve was grossly normal. Pulmonic valve  regurgitation is not visualized.   Aorta: The aortic root and ascending aorta are structurally normal, with  no evidence of dilitation.   Venous: The inferior vena cava is normal in size with less than 50%  respiratory variability, suggesting right atrial pressure of 8 mmHg.   IAS/Shunts: The interatrial septum was not well visualized.     LEFT VENTRICLE  PLAX 2D  LVIDd:         3.30 cm  LVIDs:         2.30 cm  LV PW:         0.80 cm  LV IVS:        0.70 cm  LVOT diam:     1.55 cm  LV SV:         33  LV SV Index:   23  LVOT Area:     1.89 cm     RIGHT VENTRICLE          IVC  RV Basal diam:  1.70 cm  IVC diam: 1.90 cm   LEFT ATRIUM             Index        RIGHT ATRIUM           Index  LA diam:        3.50 cm 2.45 cm/m   RA Area:     11.80 cm  LA Vol (A2C):   43.2 ml 30.20 ml/m  RA Volume:   23.20 ml  16.22 ml/m  LA Vol (A4C):   38.6 ml 26.98 ml/m  LA Biplane Vol: 41.9 ml 29.29 ml/m   AORTIC VALVE  LVOT Vmax:   101.23 cm/s  LVOT Vmean:  68.533 cm/s  LVOT VTI:    0.173 m    AORTA  Ao Root diam: 2.40 cm  Ao Asc diam:  2.80 cm   MITRAL VALVE              TRICUSPID VALVE  MV Area VTI:  0.70 cm    TR Peak grad:   25.2 mmHg  MV Peak grad: 21.6 mmHg   TR Vmax:        251.00 cm/s  MV Mean grad: 11.2 mmHg  MV Vmax:      2.32 m/s    SHUNTS  MV  Vmean:  157.3 cm/s  Systemic VTI:  0.17 m                            Systemic Diam: 1.55 cm    TEE (09/2023) IMPRESSIONS     1. Left ventricular ejection fraction, by estimation, is 60 to 65%. The  left ventricle has normal function. The left ventricle has no regional  wall motion abnormalities.   2. Right ventricular systolic function is normal. The right ventricular  size is normal.   3. No left atrial/left atrial appendage thrombus was detected.   4. Mitral valve mean gradient 5.8 mmHg. Mean gradient may be  underestimated in the setting of sedation and hypotension. Mitral valve  area by planimetry 1.32 cm^2. The mitral valve is rheumatic. No evidence  of mitral valve regurgitation. Moderate to  severe mitral stenosis.   5. Tricuspid valve regurgitation is mild to moderate.   6. The aortic valve is tricuspid. Aortic valve regurgitation is not  visualized. No aortic stenosis is present.   7. The inferior vena cava is normal in size with greater than 50%  respiratory variability, suggesting right atrial pressure of 3 mmHg.   Conclusion(s)/Recommendation(s): Normal biventricular function without  evidence of hemodynamically significant valvular heart disease.   FINDINGS   Left Ventricle: Left ventricular ejection fraction, by estimation, is 60  to 65%. The left ventricle has normal function. The left ventricle has no  regional wall motion abnormalities. The left ventricular internal cavity  size was normal in size. There is   no left ventricular hypertrophy.   Right Ventricle: The right ventricular size is normal. No increase in  right ventricular wall thickness. Right ventricular systolic function is  normal.   Left Atrium: Left atrial size was normal in size. No left atrial/left  atrial appendage thrombus was detected.   Right Atrium: Right atrial size was normal in size.   Pericardium: There is no evidence of pericardial effusion.   Mitral Valve: Mitral valve mean  gradient 5.8 mmHg. Mean gradient may be  underestimated in the setting of sedation and hypotension. Mitral valve  area by planimetry 1.32 cm^2. The mitral valve is rheumatic. There is  moderate thickening of the mitral valve  leaflet(s). No evidence of mitral valve regurgitation. Moderate to severe  mitral valve stenosis. MV peak gradient, 10.9 mmHg. The mean mitral valve  gradient is 5.0 mmHg.   Tricuspid Valve: The tricuspid valve is normal in structure. Tricuspid  valve regurgitation is mild to moderate. No evidence of tricuspid  stenosis.   Aortic Valve: The aortic valve is tricuspid. Aortic valve regurgitation is  not visualized. No aortic stenosis is present. Aortic valve mean gradient  measures 2.0 mmHg. Aortic valve peak gradient measures 3.3 mmHg. Aortic  valve area, by VTI measures 1.72  cm.   Pulmonic Valve: The pulmonic valve was normal in structure. Pulmonic valve  regurgitation is not visualized. No evidence of pulmonic stenosis.   Aorta: The aortic root is normal in size and structure.   Venous: The inferior vena cava is normal in size with greater than 50%  respiratory variability, suggesting right atrial pressure of 3 mmHg.   IAS/Shunts: No atrial level shunt detected by color flow Doppler.     LEFT VENTRICLE  PLAX 2D  LVOT diam:     1.70 cm  LV SV:         36  LV SV Index:   25  LVOT Area:  2.27 cm     AORTIC VALVE  AV Area (Vmax):    1.80 cm  AV Area (Vmean):   2.10 cm  AV Area (VTI):     1.72 cm  AV Vmax:           91.00 cm/s  AV Vmean:          65.300 cm/s  AV VTI:            0.210 m  AV Peak Grad:      3.3 mmHg  AV Mean Grad:      2.0 mmHg  LVOT Vmax:         72.10 cm/s  LVOT Vmean:        60.400 cm/s  LVOT VTI:          0.159 m  LVOT/AV VTI ratio: 0.76   MITRAL VALVE               TRICUSPID VALVE  MV Area (PHT): 1.80 cm    TR Peak grad:   45.4 mmHg  MV Area VTI:   0.82 cm    TR Vmax:        337.00 cm/s  MV Peak grad:  10.9 mmHg   MV Mean grad:  5.0 mmHg    SHUNTS  MV Vmax:       1.65 m/s    Systemic VTI:  0.16 m  MV Vmean:      105.2 cm/s  Systemic Diam: 1.70 cm     Recent Radiology Findings:   CATH (08/2023)  Conclusion       Prox LAD to Mid LAD lesion is 5% stenosed.   1.  Minimal nonobstructive coronary artery disease with patent mid LAD stents. 2.  Fick cardiac output of 4.8 L/min and Fick cardiac index of 3.3 L/min/m with the following hemodynamics:             Right atrial pressure mean of 15 mmHg             Right ventricular pressure 46/21 with an end-diastolic pressure of 14 mmHg             PA pressure 47/33 with a mean of 37 mmHg             Wedge pressure mean of 23 mmHg             PVR of 2.9 Woods units             PA pulsatility index of 0.9 consistent with right ventricular dysfunction 3.  LVEDP of 22 mmHg     Recent Lab Findings: Recent Labs       Lab Results  Component Value Date    WBC 6.5 09/29/2023    HGB 13.7 09/29/2023    HCT 43.8 09/29/2023    PLT 141 (L) 09/29/2023    GLUCOSE 91 09/29/2023    CHOL 104 12/01/2021    TRIG 84 12/01/2021    HDL 51 12/01/2021    LDLCALC 36 12/01/2021    ALT 19 09/05/2023    AST 21 09/05/2023    NA 140 09/29/2023    K 4.1 09/29/2023    CL 98 09/29/2023    CREATININE 0.90 09/29/2023    BUN 14 09/29/2023    CO2 24 09/29/2023    TSH 1.940 07/12/2023    INR 3.4 (A) 11/01/2023    HGBA1C 7.0 (H) 09/12/2023            Assessment /  Plan:     63 yo female with 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 Pt with long standing afib and on coumadin  would best be served at her age with MV mechanical replacement and consideration of TV ring annuloplasty due to moderate TR and PHTN. She will need to be bridged with lovenox  for 5 days prior to surgery on 2/5. We discussed all the risks and goals and recovery from surgery and it appears they understand and wish to proceed.

## 2023-11-28 NOTE — Anesthesia Preprocedure Evaluation (Addendum)
 Anesthesia Evaluation  Patient identified by MRN, date of birth, ID band Patient awake    Reviewed: Allergy & Precautions, NPO status , Patient's Chart, lab work & pertinent test results  History of Anesthesia Complications Negative for: history of anesthetic complications  Airway Mallampati: III  TM Distance: >3 FB Neck ROM: Full    Dental  (+) Dental Advisory Given   Pulmonary shortness of breath, neg sleep apnea, neg COPD, neg recent URI   breath sounds clear to auscultation       Cardiovascular hypertension, + CAD  + Valvular Problems/Murmurs  Rhythm:Regular     Neuro/Psych Seizures -,   Neuromuscular disease CVA    GI/Hepatic negative GI ROS, Neg liver ROS,,,  Endo/Other  diabetesHypothyroidism    Renal/GU Renal disease     Musculoskeletal   Abdominal   Peds  Hematology negative hematology ROS (+)   Anesthesia Other Findings   Reproductive/Obstetrics                             Anesthesia Physical Anesthesia Plan  ASA: 4  Anesthesia Plan: General   Post-op Pain Management:    Induction: Intravenous  PONV Risk Score and Plan: 4 or greater and Ondansetron  and Treatment may vary due to age or medical condition  Airway Management Planned: Oral ETT  Additional Equipment: Arterial line, CVP, PA Cath, TEE and Ultrasound Guidance Line Placement  Intra-op Plan:   Post-operative Plan: Post-operative intubation/ventilation  Informed Consent: I have reviewed the patients History and Physical, chart, labs and discussed the procedure including the risks, benefits and alternatives for the proposed anesthesia with the patient or authorized representative who has indicated his/her understanding and acceptance.     Dental advisory given and Consent reviewed with POA  Plan Discussed with: CRNA  Anesthesia Plan Comments: (PAT note written 11/28/2023 by Allison Zelenak, PA-C.  )        Anesthesia Quick Evaluation

## 2023-11-28 NOTE — Progress Notes (Signed)
Anesthesia Chart Review:  Case: [id number] Date/Time: 11/29/23 0815   Procedures:      MITRAL VALVE (MV) REPLACEMENT (Chest)     TRICUSPID VALVE REPAIR     TRANSESOPHAGEAL ECHOCARDIOGRAM (TEE)   Anesthesia type: General   Pre-op diagnosis:      MITRAL VALVE STENOSIS     TR     AFIB   [facility]: [facility] RO[facility] / [facility]   Surgeons: Eugenio Hoes, MD       DISCUSSION: Patient is a 63 year old female scheduled for the above procedure.  She speaks [place].  History includes never smoker, valvular disease (MS, s/p prior valvuloplasty ~ 2016 in [place] with recurrent moderate - severe MS, mild-moderate TR 09/2023 TEE), CAD (s/p LAD stent ~ 2016), afib, CVA (2019; TIA 11/2021), DM2, HTN, HLD, dyspnea, seizures, memory loss (post CVA), hypothyroidism, bronchopneumonia (08/2023).  Last warfarin 11/23/23. Lovenox bridge instructions per cardiology (see 11/15/23 anti-coag visit). INR 1.4 on 11/27/23.   Anesthesia team to evaluate on the day of surgery.    VS: BP 132/72   Pulse 63   Temp 36.8 C   Resp 18   Ht 4\' 11"  (1.499 m)   Wt 48.3 kg   SpO2 95%   BMI 21.49 kg/m   PROVIDERS: [place], Buren Kos, MD is PCP  Jodelle Red, MD is cardiologist Luciano Cutter, MD is pulmonologist Ihor Austin, NP/Sethi, Janalyn Shy, MD are neurology providers   LABS: Preoperative labs noted.  (all labs ordered are listed, but only abnormal results are displayed)  Labs Reviewed  GLUCOSE, CAPILLARY - Abnormal; Notable for the following components:      Result Value   Glucose-Capillary 121 (*)    All other components within normal limits  COMPREHENSIVE METABOLIC PANEL - Abnormal; Notable for the following components:   Glucose, Bld 103 (*)    Creatinine, Ser 1.03 (*)    Calcium 10.4 (*)    Total Bilirubin 1.3 (*)    All other components within normal limits  PROTIME-INR - Abnormal; Notable for the following components:   Prothrombin Time 17.4 (*)    INR 1.4 (*)    All other components within  normal limits  APTT - Abnormal; Notable for the following components:   aPTT 44 (*)    All other components within normal limits  URINALYSIS, ROUTINE W REFLEX MICROSCOPIC - Abnormal; Notable for the following components:   Color, Urine STRAW (*)    All other components within normal limits  HEMOGLOBIN A1C - Abnormal; Notable for the following components:   Hgb A1c MFr Bld 5.8 (*)    All other components within normal limits  SURGICAL PCR SCREEN  CBC  TYPE AND SCREEN    OTHER: PFTs 11/18/22: FVC 0.94 (38%) FEV1 0.84 (44%) Ratio 83  Interpretation: Reduced FVC and FEV1. Normal ratio. Will need lung volume testing to confirm restrictive defect   EEG 09/07/22: Summary  Normal electroencephalogram, awake, asleep and with activation procedures. There are no focal lateralizing or epileptiform features.    IMAGES: CT Chest High Resolution 09/09/23: IMPRESSION: 1. No compelling findings to suggest interstitial lung disease. The overall appearance of the lungs suggests multilobar bilateral bronchopneumonia. 2. There is also evidence of extensive air trapping from small airways disease. 3. Cardiomegaly with biatrial dilatation. 4. Aortic atherosclerosis, in addition to left main and three-vessel coronary artery disease. Please note that although the presence of coronary artery calcium documents the presence of coronary artery disease, the severity of this disease and any  potential stenosis cannot be assessed on this non-gated CT examination. Assessment for potential risk factor modification, dietary therapy or pharmacologic therapy may be warranted, if clinically indicated. 5. There are calcifications of the mitral annulus. Echocardiographic correlation for evaluation of potential valvular dysfunction may be warranted if clinically indicated.  PET Scan 12/19/22: IMPRESSION: 1. The 13 mm hypervascular AP window lesion described on the recent diagnostic chest CT shows no  hypermetabolism on PET imaging today. 2. Borderline to mildly enlarged mediastinal and left hilar lymph nodes show low level hypermetabolism on PET imaging today. As metastatic disease or lymphoproliferative disorder could have this appearance, close follow-up warranted. 3. Tiny bilateral pulmonary nodules, better characterized on recent diagnostic chest CT. These are below size threshold for resolution on PET imaging. 4. No suspicious hypermetabolism in the abdomen or pelvis. 5.  Aortic Atherosclerosis (ICD10-I70.0).   CTA Head/Neck 12/09/21: IMPRESSION: 1. No acute intracranial pathology. Unchanged remote right MCA territory infarct. 2. Patent vasculature of the head and neck. 3. Mosaic attenuation in the lung apices suggestive of air trapping/small airway disease. 4. Probable lymphadenopathy in the AP window. Recommend contrast enhanced CT of the chest for further evaluation. This may be performed on a nonemergent basis as indicated.     EKG: 11/27/23: Atrial fibrillation at 72 bpm Low voltage QRS Abnormal ECG When compared with ECG of 29-Sep-2023 09:38, No significant change since last tracing Confirmed by Donato Schultz ([id number]) on 11/27/2023 4:26:19 PM   CV: TEE 10/03/23: IMPRESSIONS   1. Left ventricular ejection fraction, by estimation, is 60 to 65%. The  left ventricle has normal function. The left ventricle has no regional  wall motion abnormalities.   2. Right ventricular systolic function is normal. The right ventricular  size is normal.   3. No left atrial/left atrial appendage thrombus was detected.   4. Mitral valve mean gradient 5.8 mmHg. Mean gradient may be  underestimated in the setting of sedation and hypotension. Mitral valve  area by planimetry 1.32 cm^2. The mitral valve is rheumatic. No evidence  of mitral valve regurgitation. Moderate to  severe mitral stenosis.   5. Tricuspid valve regurgitation is mild to moderate.   6. The aortic valve is tricuspid.  Aortic valve regurgitation is not  visualized. No aortic stenosis is present.   7. The inferior vena cava is normal in size with greater than 50%  respiratory variability, suggesting right atrial pressure of 3 mmHg.   Conclusion(s)/Recommendation(s): Normal biventricular function without  evidence of hemodynamically significant valvular heart disease.    RHC/LHC 09/07/23:   Prox LAD to Mid LAD lesion is 5% stenosed.   1.  Minimal nonobstructive coronary artery disease with patent mid LAD stents. 2.  Fick cardiac output of 4.8 L/min and Fick cardiac index of 3.3 L/min/m with the following hemodynamics:             Right atrial pressure mean of 15 mmHg             Right ventricular pressure 46/21 with an end-diastolic pressure of 14 mmHg             PA pressure 47/33 with a mean of 37 mmHg             Wedge pressure mean of 23 mmHg             PVR of 2.9 Woods units             PA pulsatility index of 0.9 consistent with right ventricular dysfunction 3.  LVEDP of 22 mmHg   Recommendation: Continue evaluation and treatment of severe recurrent rheumatic mitral stenosis per cardiology rounding team.   Past Medical History:  Diagnosis Date   Atrial fibrillation (HCC)    Coronary artery disease    s/p LAD stents   Diabetes mellitus without complication (HCC)    Dyspnea    Headache    HLD (hyperlipidemia)    Hypertension    Memory loss    due to stroke in 2024   Neuromuscular disorder (HCC)    feet   Pneumonia    x 1   Seizure (HCC)    last one was in 09/2023   Stroke [facility] 2019   Thyroid disease    Walker as ambulation aid     Past Surgical History:  Procedure Laterality Date   RIGHT/LEFT HEART CATH AND CORONARY ANGIOGRAPHY N/A 09/07/2023   Procedure: RIGHT/LEFT HEART CATH AND CORONARY ANGIOGRAPHY;  Surgeon: Orbie Pyo, MD;  Location[facility] INVAS[facility] CV LAB;  Service: Cardiovascular;  Laterality: N/A;   TRANSESOPHAGEAL ECHOCARDIOGRAM (CATH LAB) N/A 10/03/2023    Procedure: TRANSESOPHAGEAL ECHOCARDIOGRAM;  Surgeon: Chilton Si, MD;  Location: [facility] INVASIVE CV LAB;  Service: Cardiovascular;  Laterality: N/A;    MEDICATIONS:  acetaminophen (TYLENOL) 500 MG tablet   albuterol (VENTOLIN HFA) 108 (90 Base) MCG/ACT inhaler   aspirin EC (ASPIRIN LOW DOSE) 81 MG tablet   atorvastatin (LIPITOR) 80 MG tablet   cetaphil (CETAPHIL) lotion   enoxaparin (LOVENOX) 40 MG/0.4ML injection   furosemide (LASIX) 40 MG tablet   gabapentin (NEURONTIN) 100 MG capsule   levETIRAcetam (KEPPRA) 250 MG tablet   levothyroxine (SYNTHROID) 50 MCG tablet   metFORMIN (GLUCOPHAGE-XR) 500 MG 24 hr tablet   metoprolol succinate (TOPROL-XL) 50 MG 24 hr tablet   mometasone-formoterol (DULERA) 100-5 MCG/ACT AERO   spironolactone (ALDACTONE) 25 MG tablet   warfarin (COUMADIN) 2.5 MG tablet   No current facility-administered medications for this encounter.    Shonna Chock, PA-C Surgical Short Stay/Anesthesiology [facility] Phone [phone] [facility] Phone [phone] 11/28/2023 12:56 PM

## 2023-11-29 ENCOUNTER — Other Ambulatory Visit: Payer: Self-pay

## 2023-11-29 ENCOUNTER — Inpatient Hospital Stay (HOSPITAL_COMMUNITY): Payer: Medicaid Other

## 2023-11-29 ENCOUNTER — Inpatient Hospital Stay (HOSPITAL_COMMUNITY): Payer: Medicaid Other | Admitting: Anesthesiology

## 2023-11-29 ENCOUNTER — Inpatient Hospital Stay (HOSPITAL_COMMUNITY)
Admission: RE | Admit: 2023-11-29 | Discharge: 2023-12-07 | DRG: 219 | Disposition: A | Discharge status: Home Health | Payer: Medicaid Other | Attending: Thoracic Surgery (Cardiothoracic Vascular Surgery) | Admitting: Thoracic Surgery (Cardiothoracic Vascular Surgery)

## 2023-11-29 ENCOUNTER — Encounter (HOSPITAL_COMMUNITY)
Admission: RE | Disposition: A | Discharge status: Home Health | Payer: Self-pay | Source: Home / Self Care | Attending: Thoracic Surgery (Cardiothoracic Vascular Surgery)

## 2023-11-29 ENCOUNTER — Encounter (HOSPITAL_COMMUNITY): Payer: Self-pay | Admitting: Thoracic Surgery (Cardiothoracic Vascular Surgery)

## 2023-11-29 DIAGNOSIS — Z5948 Other specified lack of adequate food: Secondary | ICD-10-CM

## 2023-11-29 DIAGNOSIS — R0689 Other abnormalities of breathing: Secondary | ICD-10-CM | POA: Diagnosis not present

## 2023-11-29 DIAGNOSIS — E8770 Fluid overload, unspecified: Secondary | ICD-10-CM | POA: Diagnosis not present

## 2023-11-29 DIAGNOSIS — I2510 Atherosclerotic heart disease of native coronary artery without angina pectoris: Secondary | ICD-10-CM | POA: Diagnosis present

## 2023-11-29 DIAGNOSIS — E785 Hyperlipidemia, unspecified: Secondary | ICD-10-CM | POA: Diagnosis present

## 2023-11-29 DIAGNOSIS — J40 Bronchitis, not specified as acute or chronic: Secondary | ICD-10-CM | POA: Diagnosis present

## 2023-11-29 DIAGNOSIS — I059 Rheumatic mitral valve disease, unspecified: Secondary | ICD-10-CM | POA: Diagnosis not present

## 2023-11-29 DIAGNOSIS — Z7982 Long term (current) use of aspirin: Secondary | ICD-10-CM

## 2023-11-29 DIAGNOSIS — D62 Acute posthemorrhagic anemia: Secondary | ICD-10-CM | POA: Diagnosis not present

## 2023-11-29 DIAGNOSIS — E871 Hypo-osmolality and hyponatremia: Secondary | ICD-10-CM | POA: Diagnosis present

## 2023-11-29 DIAGNOSIS — E875 Hyperkalemia: Secondary | ICD-10-CM | POA: Diagnosis present

## 2023-11-29 DIAGNOSIS — E878 Other disorders of electrolyte and fluid balance, not elsewhere classified: Secondary | ICD-10-CM | POA: Diagnosis not present

## 2023-11-29 DIAGNOSIS — Z955 Presence of coronary angioplasty implant and graft: Secondary | ICD-10-CM | POA: Diagnosis not present

## 2023-11-29 DIAGNOSIS — R001 Bradycardia, unspecified: Secondary | ICD-10-CM | POA: Diagnosis not present

## 2023-11-29 DIAGNOSIS — Z8701 Personal history of pneumonia (recurrent): Secondary | ICD-10-CM

## 2023-11-29 DIAGNOSIS — I340 Nonrheumatic mitral (valve) insufficiency: Secondary | ICD-10-CM

## 2023-11-29 DIAGNOSIS — R569 Unspecified convulsions: Secondary | ICD-10-CM | POA: Diagnosis present

## 2023-11-29 DIAGNOSIS — Z7951 Long term (current) use of inhaled steroids: Secondary | ICD-10-CM

## 2023-11-29 DIAGNOSIS — E1165 Type 2 diabetes mellitus with hyperglycemia: Secondary | ICD-10-CM | POA: Diagnosis present

## 2023-11-29 DIAGNOSIS — E039 Hypothyroidism, unspecified: Secondary | ICD-10-CM | POA: Diagnosis present

## 2023-11-29 DIAGNOSIS — I10 Essential (primary) hypertension: Secondary | ICD-10-CM | POA: Diagnosis present

## 2023-11-29 DIAGNOSIS — Z7901 Long term (current) use of anticoagulants: Secondary | ICD-10-CM | POA: Diagnosis not present

## 2023-11-29 DIAGNOSIS — E119 Type 2 diabetes mellitus without complications: Secondary | ICD-10-CM

## 2023-11-29 DIAGNOSIS — I4821 Permanent atrial fibrillation: Secondary | ICD-10-CM

## 2023-11-29 DIAGNOSIS — J810 Acute pulmonary edema: Secondary | ICD-10-CM | POA: Diagnosis present

## 2023-11-29 DIAGNOSIS — D6959 Other secondary thrombocytopenia: Secondary | ICD-10-CM | POA: Diagnosis present

## 2023-11-29 DIAGNOSIS — Z7984 Long term (current) use of oral hypoglycemic drugs: Secondary | ICD-10-CM | POA: Diagnosis not present

## 2023-11-29 DIAGNOSIS — N179 Acute kidney failure, unspecified: Secondary | ICD-10-CM | POA: Diagnosis not present

## 2023-11-29 DIAGNOSIS — I482 Chronic atrial fibrillation, unspecified: Secondary | ICD-10-CM | POA: Diagnosis present

## 2023-11-29 DIAGNOSIS — R54 Age-related physical debility: Secondary | ICD-10-CM | POA: Diagnosis present

## 2023-11-29 DIAGNOSIS — Z952 Presence of prosthetic heart valve: Principal | ICD-10-CM

## 2023-11-29 DIAGNOSIS — Z8673 Personal history of transient ischemic attack (TIA), and cerebral infarction without residual deficits: Secondary | ICD-10-CM | POA: Diagnosis not present

## 2023-11-29 DIAGNOSIS — I050 Rheumatic mitral stenosis: Principal | ICD-10-CM | POA: Diagnosis present

## 2023-11-29 DIAGNOSIS — Z79890 Hormone replacement therapy (postmenopausal): Secondary | ICD-10-CM

## 2023-11-29 DIAGNOSIS — Z5982 Transportation insecurity: Secondary | ICD-10-CM

## 2023-11-29 DIAGNOSIS — I4891 Unspecified atrial fibrillation: Secondary | ICD-10-CM | POA: Diagnosis not present

## 2023-11-29 DIAGNOSIS — Z5986 Financial insecurity: Secondary | ICD-10-CM

## 2023-11-29 DIAGNOSIS — Z79899 Other long term (current) drug therapy: Secondary | ICD-10-CM

## 2023-11-29 DIAGNOSIS — Z604 Social exclusion and rejection: Secondary | ICD-10-CM | POA: Diagnosis present

## 2023-11-29 DIAGNOSIS — R413 Other amnesia: Secondary | ICD-10-CM | POA: Diagnosis present

## 2023-11-29 DIAGNOSIS — Z5941 Food insecurity: Secondary | ICD-10-CM

## 2023-11-29 HISTORY — PX: TEE WITHOUT CARDIOVERSION: SHX5443

## 2023-11-29 HISTORY — PX: MITRAL VALVE REPLACEMENT: SHX147

## 2023-11-29 LAB — POCT I-STAT, CHEM 8
BUN: 11 mg/dL (ref 8–23)
BUN: 11 mg/dL (ref 8–23)
BUN: 10 mg/dL (ref 8–23)
BUN: 10 mg/dL (ref 8–23)
Calcium, Ion: 1.24 mmol/L (ref 1.15–1.40)
Calcium, Ion: 1.27 mmol/L (ref 1.15–1.40)
Calcium, Ion: 1.07 mmol/L — ABNORMAL LOW (ref 1.15–1.40)
Calcium, Ion: 1.09 mmol/L — ABNORMAL LOW (ref 1.15–1.40)
Chloride: 103 mmol/L (ref 98–111)
Chloride: 106 mmol/L (ref 98–111)
Chloride: 102 mmol/L (ref 98–111)
Chloride: 103 mmol/L (ref 98–111)
Creatinine, Ser: 0.6 mg/dL (ref 0.44–1.00)
Creatinine, Ser: 0.6 mg/dL (ref 0.44–1.00)
Creatinine, Ser: 0.5 mg/dL (ref 0.44–1.00)
Creatinine, Ser: 0.6 mg/dL (ref 0.44–1.00)
Glucose, Bld: 125 mg/dL — ABNORMAL HIGH (ref 70–99)
Glucose, Bld: 189 mg/dL — ABNORMAL HIGH (ref 70–99)
Glucose, Bld: 117 mg/dL — ABNORMAL HIGH (ref 70–99)
Glucose, Bld: 119 mg/dL — ABNORMAL HIGH (ref 70–99)
HCT: 29 % — ABNORMAL LOW (ref 36.0–46.0)
HCT: 32 % — ABNORMAL LOW (ref 36.0–46.0)
HCT: 21 % — ABNORMAL LOW (ref 36.0–46.0)
HCT: 22 % — ABNORMAL LOW (ref 36.0–46.0)
Hemoglobin: 10.9 g/dL — ABNORMAL LOW (ref 12.0–15.0)
Hemoglobin: 9.9 g/dL — ABNORMAL LOW (ref 12.0–15.0)
Hemoglobin: 7.1 g/dL — ABNORMAL LOW (ref 12.0–15.0)
Hemoglobin: 7.5 g/dL — ABNORMAL LOW (ref 12.0–15.0)
Potassium: 3.5 mmol/L (ref 3.5–5.1)
Potassium: 3.5 mmol/L (ref 3.5–5.1)
Potassium: 4.2 mmol/L (ref 3.5–5.1)
Potassium: 4.2 mmol/L (ref 3.5–5.1)
Sodium: 136 mmol/L (ref 135–145)
Sodium: 139 mmol/L (ref 135–145)
Sodium: 138 mmol/L (ref 135–145)
Sodium: 139 mmol/L (ref 135–145)
TCO2: 22 mmol/L (ref 22–32)
TCO2: 24 mmol/L (ref 22–32)
TCO2: 25 mmol/L (ref 22–32)
TCO2: 26 mmol/L (ref 22–32)

## 2023-11-29 LAB — POCT I-STAT 7, (LYTES, BLD GAS, ICA,H+H)
Acid-base deficit: 2 mmol/L (ref 0.0–2.0)
Acid-base deficit: 5 mmol/L — ABNORMAL HIGH (ref 0.0–2.0)
Acid-base deficit: 2 mmol/L (ref 0.0–2.0)
Acid-base deficit: 4 mmol/L — ABNORMAL HIGH (ref 0.0–2.0)
Acid-base deficit: 5 mmol/L — ABNORMAL HIGH (ref 0.0–2.0)
Acid-base deficit: 2 mmol/L (ref 0.0–2.0)
Bicarbonate: 22.1 mmol/L (ref 20.0–28.0)
Bicarbonate: 19.3 mmol/L — ABNORMAL LOW (ref 20.0–28.0)
Bicarbonate: 23.9 mmol/L (ref 20.0–28.0)
Bicarbonate: 22.7 mmol/L (ref 20.0–28.0)
Bicarbonate: 20.6 mmol/L (ref 20.0–28.0)
Bicarbonate: 23.2 mmol/L (ref 20.0–28.0)
Calcium, Ion: 1.28 mmol/L (ref 1.15–1.40)
Calcium, Ion: 0.97 mmol/L — ABNORMAL LOW (ref 1.15–1.40)
Calcium, Ion: 1.11 mmol/L — ABNORMAL LOW (ref 1.15–1.40)
Calcium, Ion: 1.11 mmol/L — ABNORMAL LOW (ref 1.15–1.40)
Calcium, Ion: 1.19 mmol/L (ref 1.15–1.40)
Calcium, Ion: 1.15 mmol/L (ref 1.15–1.40)
HCT: 32 % — ABNORMAL LOW (ref 36.0–46.0)
HCT: 20 % — ABNORMAL LOW (ref 36.0–46.0)
HCT: 21 % — ABNORMAL LOW (ref 36.0–46.0)
HCT: 24 % — ABNORMAL LOW (ref 36.0–46.0)
HCT: 20 % — ABNORMAL LOW (ref 36.0–46.0)
HCT: 19 % — ABNORMAL LOW (ref 36.0–46.0)
Hemoglobin: 10.9 g/dL — ABNORMAL LOW (ref 12.0–15.0)
Hemoglobin: 6.8 g/dL — CL (ref 12.0–15.0)
Hemoglobin: 7.1 g/dL — ABNORMAL LOW (ref 12.0–15.0)
Hemoglobin: 8.2 g/dL — ABNORMAL LOW (ref 12.0–15.0)
Hemoglobin: 6.8 g/dL — CL (ref 12.0–15.0)
Hemoglobin: 6.5 g/dL — CL (ref 12.0–15.0)
O2 Saturation: 100 %
O2 Saturation: 100 %
O2 Saturation: 100 %
O2 Saturation: 97 %
O2 Saturation: 93 %
O2 Saturation: 94 %
Patient temperature: 36
Patient temperature: 36.7
Patient temperature: 36.7
Potassium: 3.5 mmol/L (ref 3.5–5.1)
Potassium: 4.1 mmol/L (ref 3.5–5.1)
Potassium: 4.2 mmol/L (ref 3.5–5.1)
Potassium: 3.9 mmol/L (ref 3.5–5.1)
Potassium: 4.5 mmol/L (ref 3.5–5.1)
Potassium: 4.2 mmol/L (ref 3.5–5.1)
Sodium: 139 mmol/L (ref 135–145)
Sodium: 138 mmol/L (ref 135–145)
Sodium: 138 mmol/L (ref 135–145)
Sodium: 140 mmol/L (ref 135–145)
Sodium: 139 mmol/L (ref 135–145)
Sodium: 141 mmol/L (ref 135–145)
TCO2: 23 mmol/L (ref 22–32)
TCO2: 20 mmol/L — ABNORMAL LOW (ref 22–32)
TCO2: 25 mmol/L (ref 22–32)
TCO2: 24 mmol/L (ref 22–32)
TCO2: 22 mmol/L (ref 22–32)
TCO2: 25 mmol/L (ref 22–32)
pCO2 arterial: 36.6 mm[Hg] (ref 32–48)
pCO2 arterial: 31.5 mm[Hg] — ABNORMAL LOW (ref 32–48)
pCO2 arterial: 44.4 mm[Hg] (ref 32–48)
pCO2 arterial: 46.7 mm[Hg] (ref 32–48)
pCO2 arterial: 41.8 mm[Hg] (ref 32–48)
pCO2 arterial: 42.4 mm[Hg] (ref 32–48)
pH, Arterial: 7.39 (ref 7.35–7.45)
pH, Arterial: 7.394 (ref 7.35–7.45)
pH, Arterial: 7.339 — ABNORMAL LOW (ref 7.35–7.45)
pH, Arterial: 7.29 — ABNORMAL LOW (ref 7.35–7.45)
pH, Arterial: 7.3 — ABNORMAL LOW (ref 7.35–7.45)
pH, Arterial: 7.346 — ABNORMAL LOW (ref 7.35–7.45)
pO2, Arterial: 216 mm[Hg] — ABNORMAL HIGH (ref 83–108)
pO2, Arterial: 395 mm[Hg] — ABNORMAL HIGH (ref 83–108)
pO2, Arterial: 281 mm[Hg] — ABNORMAL HIGH (ref 83–108)
pO2, Arterial: 99 mm[Hg] (ref 83–108)
pO2, Arterial: 75 mm[Hg] — ABNORMAL LOW (ref 83–108)
pO2, Arterial: 74 mm[Hg] — ABNORMAL LOW (ref 83–108)

## 2023-11-29 LAB — POCT I-STAT EG7
Acid-base deficit: 1 mmol/L (ref 0.0–2.0)
Bicarbonate: 25.6 mmol/L (ref 20.0–28.0)
Calcium, Ion: 1.06 mmol/L — ABNORMAL LOW (ref 1.15–1.40)
HCT: 21 % — ABNORMAL LOW (ref 36.0–46.0)
Hemoglobin: 7.1 g/dL — ABNORMAL LOW (ref 12.0–15.0)
O2 Saturation: 84 %
Potassium: 4.2 mmol/L (ref 3.5–5.1)
Sodium: 140 mmol/L (ref 135–145)
TCO2: 27 mmol/L (ref 22–32)
pCO2, Ven: 53.2 mm[Hg] (ref 44–60)
pH, Ven: 7.29 (ref 7.25–7.43)
pO2, Ven: 55 mm[Hg] — ABNORMAL HIGH (ref 32–45)

## 2023-11-29 LAB — CBC
HCT: 25.1 % — ABNORMAL LOW (ref 36.0–46.0)
HCT: 22.1 % — ABNORMAL LOW (ref 36.0–46.0)
Hemoglobin: 8 g/dL — ABNORMAL LOW (ref 12.0–15.0)
Hemoglobin: 7.1 g/dL — ABNORMAL LOW (ref 12.0–15.0)
MCH: 28.5 pg (ref 26.0–34.0)
MCH: 28.7 pg (ref 26.0–34.0)
MCHC: 31.9 g/dL (ref 30.0–36.0)
MCHC: 32.1 g/dL (ref 30.0–36.0)
MCV: 89.3 fL (ref 80.0–100.0)
MCV: 89.5 fL (ref 80.0–100.0)
Platelets: 85 10*3/uL — ABNORMAL LOW (ref 150–400)
Platelets: 61 10*3/uL — ABNORMAL LOW (ref 150–400)
RBC: 2.81 MIL/uL — ABNORMAL LOW (ref 3.87–5.11)
RBC: 2.47 MIL/uL — ABNORMAL LOW (ref 3.87–5.11)
RDW: 14 % (ref 11.5–15.5)
RDW: 14.1 % (ref 11.5–15.5)
WBC: 12.3 10*3/uL — ABNORMAL HIGH (ref 4.0–10.5)
WBC: 9.5 10*3/uL (ref 4.0–10.5)
nRBC: 0 % (ref 0.0–0.2)
nRBC: 0 % (ref 0.0–0.2)

## 2023-11-29 LAB — GLUCOSE, CAPILLARY
Glucose-Capillary: 89 mg/dL (ref 70–99)
Glucose-Capillary: 122 mg/dL — ABNORMAL HIGH (ref 70–99)
Glucose-Capillary: 164 mg/dL — ABNORMAL HIGH (ref 70–99)
Glucose-Capillary: 127 mg/dL — ABNORMAL HIGH (ref 70–99)
Glucose-Capillary: 121 mg/dL — ABNORMAL HIGH (ref 70–99)
Glucose-Capillary: 146 mg/dL — ABNORMAL HIGH (ref 70–99)
Glucose-Capillary: 125 mg/dL — ABNORMAL HIGH (ref 70–99)
Glucose-Capillary: 147 mg/dL — ABNORMAL HIGH (ref 70–99)
Glucose-Capillary: 141 mg/dL — ABNORMAL HIGH (ref 70–99)
Glucose-Capillary: 139 mg/dL — ABNORMAL HIGH (ref 70–99)

## 2023-11-29 LAB — BASIC METABOLIC PANEL
Anion gap: 10 (ref 5–15)
BUN: 8 mg/dL (ref 8–23)
CO2: 24 mmol/L (ref 22–32)
Calcium: 8.1 mg/dL — ABNORMAL LOW (ref 8.9–10.3)
Chloride: 106 mmol/L (ref 98–111)
Creatinine, Ser: 0.71 mg/dL (ref 0.44–1.00)
GFR, Estimated: >60 mL/min (ref >60)
Glucose, Bld: 114 mg/dL — ABNORMAL HIGH (ref 70–99)
Potassium: 4.1 mmol/L (ref 3.5–5.1)
Sodium: 140 mmol/L (ref 135–145)

## 2023-11-29 LAB — PROTIME-INR
INR: 1.1 (ref 0.8–1.2)
INR: 1.8 — ABNORMAL HIGH (ref 0.8–1.2)
Prothrombin Time: 14.7 s (ref 11.4–15.2)
Prothrombin Time: 21.1 s — ABNORMAL HIGH (ref 11.4–15.2)

## 2023-11-29 LAB — ABO/RH: ABO/RH(D): AB POS

## 2023-11-29 LAB — MAGNESIUM: Magnesium: 3.7 mg/dL — ABNORMAL HIGH (ref 1.7–2.4)

## 2023-11-29 LAB — HEMOGLOBIN AND HEMATOCRIT, BLOOD
HCT: 21.2 % — ABNORMAL LOW (ref 36.0–46.0)
HCT: 27.3 % — ABNORMAL LOW (ref 36.0–46.0)
Hemoglobin: 7 g/dL — ABNORMAL LOW (ref 12.0–15.0)
Hemoglobin: 8.8 g/dL — ABNORMAL LOW (ref 12.0–15.0)

## 2023-11-29 LAB — PLATELET COUNT: Platelets: 73 10*3/uL — ABNORMAL LOW (ref 150–400)

## 2023-11-29 LAB — APTT: aPTT: 40 s — ABNORMAL HIGH (ref 24–36)

## 2023-11-29 LAB — PREPARE RBC (CROSSMATCH)

## 2023-11-29 SURGERY — REPLACEMENT, MITRAL VALVE
Anesthesia: General | Site: Chest

## 2023-11-29 MED ORDER — SODIUM CHLORIDE 0.9% FLUSH: 3.0000 mL | INTRAVENOUS | Status: DC | PRN

## 2023-11-29 MED ORDER — MAGNESIUM SULFATE 4 GM/100ML IV SOLN
4.0000 g | Freq: Once | INTRAVENOUS | Status: AC
  Administered 2023-11-29: 4 g via INTRAVENOUS
  Filled 2023-11-29: qty 100

## 2023-11-29 MED ORDER — METOPROLOL TARTRATE 12.5 MG HALF TABLET: 12.5000 mg | ORAL_TABLET | Freq: Two times a day (BID) | ORAL | Status: DC

## 2023-11-29 MED ORDER — PROTAMINE SULFATE 10 MG/ML IV SOLN
INTRAVENOUS | Status: AC
  Filled 2023-11-29: qty 25

## 2023-11-29 MED ORDER — SODIUM CHLORIDE 0.9 % IV SOLN: INTRAVENOUS | Status: DC | PRN

## 2023-11-29 MED ORDER — IPRATROPIUM-ALBUTEROL 0.5-2.5 (3) MG/3ML IN SOLN
3.0000 mL | Freq: Four times a day (QID) | RESPIRATORY_TRACT | Status: DC
  Administered 2023-11-29 (×2): 3 mL via RESPIRATORY_TRACT
  Filled 2023-11-29 (×2): qty 3

## 2023-11-29 MED ORDER — NOREPINEPHRINE 4 MG/250ML-% IV SOLN
INTRAVENOUS | Status: DC | PRN
  Administered 2023-11-29: 2 ug/min via INTRAVENOUS

## 2023-11-29 MED ORDER — ACETAMINOPHEN 500 MG PO TABS
1000.0000 mg | ORAL_TABLET | Freq: Four times a day (QID) | ORAL | Status: AC
  Administered 2023-11-29 – 2023-12-04 (×19): 1000 mg via ORAL
  Filled 2023-11-29 (×20): qty 2

## 2023-11-29 MED ORDER — SODIUM CHLORIDE 0.9% FLUSH: 10.0000 mL | INTRAVENOUS | Status: DC | PRN

## 2023-11-29 MED ORDER — VASOPRESSIN 20 UNIT/ML IV SOLN
INTRAVENOUS | Status: AC
  Filled 2023-11-29: qty 1

## 2023-11-29 MED ORDER — SODIUM CHLORIDE 0.9% FLUSH: 3.0000 mL | Freq: Two times a day (BID) | INTRAVENOUS | Status: DC

## 2023-11-29 MED ORDER — IPRATROPIUM-ALBUTEROL 0.5-2.5 (3) MG/3ML IN SOLN: 3.0000 mL | RESPIRATORY_TRACT | Status: DC | PRN

## 2023-11-29 MED ORDER — VANCOMYCIN HCL 1000 MG IV SOLR: INTRAVENOUS | Status: DC | PRN

## 2023-11-29 MED ORDER — DEXTROSE 50 % IV SOLN: 0.0000 mL | INTRAVENOUS | Status: DC | PRN

## 2023-11-29 MED ORDER — LEVETIRACETAM 250 MG PO TABS
250.0000 mg | ORAL_TABLET | Freq: Two times a day (BID) | ORAL | Status: DC
Start: 2023-11-29 — End: 2023-12-07
  Administered 2023-11-29 – 2023-12-07 (×16): 250 mg via ORAL
  Filled 2023-11-29 (×16): qty 1

## 2023-11-29 MED ORDER — PROTAMINE SULFATE 10 MG/ML IV SOLN
INTRAVENOUS | Status: DC | PRN
  Administered 2023-11-29: 20 mg via INTRAVENOUS
  Administered 2023-11-29: 150 mg via INTRAVENOUS

## 2023-11-29 MED ORDER — SODIUM CHLORIDE 0.9% FLUSH
3.0000 mL | Freq: Two times a day (BID) | INTRAVENOUS | Status: DC
  Administered 2023-11-29: 5 mL via INTRAVENOUS
  Administered 2023-11-29: 10 mL via INTRAVENOUS

## 2023-11-29 MED ORDER — BISACODYL 10 MG RE SUPP
10.0000 mg | Freq: Every day | RECTAL | Status: DC
  Filled 2023-11-29: qty 1

## 2023-11-29 MED ORDER — METOCLOPRAMIDE HCL 5 MG/ML IJ SOLN
10.0000 mg | Freq: Four times a day (QID) | INTRAMUSCULAR | Status: AC
  Administered 2023-11-29 – 2023-11-30 (×6): 10 mg via INTRAVENOUS
  Filled 2023-11-29 (×6): qty 2

## 2023-11-29 MED ORDER — SODIUM CHLORIDE 0.9% FLUSH
3.0000 mL | Freq: Two times a day (BID) | INTRAVENOUS | Status: DC
  Administered 2023-11-29: 3 mL via INTRAVENOUS
  Administered 2023-11-29: 5 mL via INTRAVENOUS

## 2023-11-29 MED ORDER — ASPIRIN 81 MG PO CHEW: 324.0000 mg | CHEWABLE_TABLET | Freq: Every day | ORAL | Status: DC

## 2023-11-29 MED ORDER — HEPARIN SODIUM (PORCINE) 1000 UNIT/ML IJ SOLN
INTRAMUSCULAR | Status: DC | PRN
  Administered 2023-11-29: 17000 [IU] via INTRAVENOUS

## 2023-11-29 MED ORDER — DEXMEDETOMIDINE HCL IN NACL 400 MCG/100ML IV SOLN: 0.0000 ug/kg/h | INTRAVENOUS | Status: DC

## 2023-11-29 MED ORDER — ROCURONIUM BROMIDE 10 MG/ML (PF) SYRINGE
PREFILLED_SYRINGE | INTRAVENOUS | Status: AC
  Filled 2023-11-29: qty 10

## 2023-11-29 MED ORDER — MORPHINE SULFATE (PF) 2 MG/ML IV SOLN
1.0000 mg | INTRAVENOUS | Status: DC | PRN
  Administered 2023-11-29 – 2023-11-30 (×3): 1 mg via INTRAVENOUS
  Administered 2023-11-30: 2 mg via INTRAVENOUS
  Filled 2023-11-29 (×3): qty 1

## 2023-11-29 MED ORDER — OXYCODONE HCL 5 MG PO TABS
5.0000 mg | ORAL_TABLET | ORAL | Status: DC | PRN
  Administered 2023-11-30 – 2023-12-02 (×5): 5 mg via ORAL
  Administered 2023-12-03: 10 mg via ORAL
  Administered 2023-12-05 – 2023-12-06 (×4): 5 mg via ORAL
  Filled 2023-11-29: qty 2
  Filled 2023-11-29 (×10): qty 1

## 2023-11-29 MED ORDER — ORAL CARE MOUTH RINSE: 15.0000 mL | OROMUCOSAL | Status: DC | PRN

## 2023-11-29 MED ORDER — LEVOTHYROXINE SODIUM 50 MCG PO TABS
50.0000 ug | ORAL_TABLET | Freq: Every day | ORAL | Status: DC
  Administered 2023-11-30 – 2023-12-07 (×8): 50 ug via ORAL
  Filled 2023-11-29 (×8): qty 1

## 2023-11-29 MED ORDER — PROPOFOL 10 MG/ML IV BOLUS
INTRAVENOUS | Status: DC | PRN
  Administered 2023-11-29: 30 mg via INTRAVENOUS

## 2023-11-29 MED ORDER — PHENYLEPHRINE 80 MCG/ML (10ML) SYRINGE FOR IV PUSH (FOR BLOOD PRESSURE SUPPORT)
PREFILLED_SYRINGE | INTRAVENOUS | Status: AC
  Filled 2023-11-29: qty 10

## 2023-11-29 MED ORDER — MILRINONE LACTATE IN DEXTROSE 20-5 MG/100ML-% IV SOLN
INTRAVENOUS | Status: DC | PRN
  Administered 2023-11-29: .375 ug/kg/min via INTRAVENOUS

## 2023-11-29 MED ORDER — ACETAMINOPHEN 160 MG/5ML PO SOLN
650.0000 mg | Freq: Once | ORAL | Status: AC
  Administered 2023-11-29: 650 mg
  Filled 2023-11-29: qty 20.3

## 2023-11-29 MED ORDER — VASOPRESSIN 20 UNIT/ML IV SOLN
INTRAVENOUS | Status: DC | PRN
  Administered 2023-11-29: .5 [IU] via INTRAVENOUS

## 2023-11-29 MED ORDER — SODIUM CHLORIDE 0.9 % IV SOLN: INTRAVENOUS | Status: AC

## 2023-11-29 MED ORDER — MIDAZOLAM HCL 2 MG/2ML IJ SOLN
INTRAMUSCULAR | Status: AC
  Filled 2023-11-29: qty 2

## 2023-11-29 MED ORDER — FENTANYL CITRATE (PF) 250 MCG/5ML IJ SOLN
INTRAMUSCULAR | Status: AC
  Filled 2023-11-29: qty 5

## 2023-11-29 MED ORDER — CEFAZOLIN SODIUM-DEXTROSE 2-4 GM/100ML-% IV SOLN
2.0000 g | Freq: Three times a day (TID) | INTRAVENOUS | Status: AC
  Administered 2023-11-29 – 2023-12-01 (×6): 2 g via INTRAVENOUS
  Filled 2023-11-29 (×6): qty 100

## 2023-11-29 MED ORDER — POTASSIUM CHLORIDE 10 MEQ/50ML IV SOLN
10.0000 meq | INTRAVENOUS | Status: AC
  Administered 2023-11-29 (×3): 10 meq via INTRAVENOUS

## 2023-11-29 MED ORDER — ONDANSETRON HCL 4 MG/2ML IJ SOLN
4.0000 mg | Freq: Four times a day (QID) | INTRAMUSCULAR | Status: DC | PRN
  Administered 2023-11-30 – 2023-12-03 (×4): 4 mg via INTRAVENOUS
  Filled 2023-11-29 (×6): qty 2

## 2023-11-29 MED ORDER — 0.9 % SODIUM CHLORIDE (POUR BTL) OPTIME
TOPICAL | Status: DC | PRN
  Administered 2023-11-29: 4000 mL

## 2023-11-29 MED ORDER — SODIUM CHLORIDE 0.9% FLUSH
3.0000 mL | Freq: Two times a day (BID) | INTRAVENOUS | Status: DC
  Administered 2023-11-29: 5 mL via INTRAVENOUS
  Administered 2023-11-29: 3 mL via INTRAVENOUS

## 2023-11-29 MED ORDER — CHLORHEXIDINE GLUCONATE 4 % EX SOLN: 30.0000 mL | CUTANEOUS | Status: DC

## 2023-11-29 MED ORDER — MIDAZOLAM HCL 2 MG/2ML IJ SOLN: 2.0000 mg | INTRAMUSCULAR | Status: DC | PRN

## 2023-11-29 MED ORDER — ASPIRIN 325 MG PO TBEC
325.0000 mg | DELAYED_RELEASE_TABLET | Freq: Every day | ORAL | Status: DC
  Administered 2023-11-30: 325 mg via ORAL
  Filled 2023-11-29: qty 1

## 2023-11-29 MED ORDER — ~~LOC~~ CARDIAC SURGERY, PATIENT & FAMILY EDUCATION
Freq: Once | Status: DC
  Filled 2023-11-29: qty 1

## 2023-11-29 MED ORDER — PANTOPRAZOLE SODIUM 40 MG IV SOLR
40.0000 mg | Freq: Every day | INTRAVENOUS | Status: AC
  Administered 2023-11-29 – 2023-11-30 (×2): 40 mg via INTRAVENOUS
  Filled 2023-11-29 (×2): qty 10

## 2023-11-29 MED ORDER — CHLORHEXIDINE GLUCONATE 0.12 % MT SOLN
15.0000 mL | OROMUCOSAL | Status: AC
  Administered 2023-11-29: 15 mL via OROMUCOSAL
  Filled 2023-11-29: qty 15

## 2023-11-29 MED ORDER — LACTATED RINGERS IV SOLN: INTRAVENOUS | Status: DC | PRN

## 2023-11-29 MED ORDER — PANTOPRAZOLE SODIUM 40 MG PO TBEC
40.0000 mg | DELAYED_RELEASE_TABLET | Freq: Every day | ORAL | Status: DC
  Administered 2023-12-01 – 2023-12-07 (×7): 40 mg via ORAL
  Filled 2023-11-29 (×7): qty 1

## 2023-11-29 MED ORDER — ATORVASTATIN CALCIUM 80 MG PO TABS
80.0000 mg | ORAL_TABLET | Freq: Every day | ORAL | Status: DC
Start: 2023-11-30 — End: 2023-12-07
  Administered 2023-11-30 – 2023-12-07 (×8): 80 mg via ORAL
  Filled 2023-11-29 (×8): qty 1

## 2023-11-29 MED ORDER — PROPOFOL 10 MG/ML IV BOLUS
INTRAVENOUS | Status: AC
  Filled 2023-11-29: qty 20

## 2023-11-29 MED ORDER — PHENYLEPHRINE 80 MCG/ML (10ML) SYRINGE FOR IV PUSH (FOR BLOOD PRESSURE SUPPORT)
PREFILLED_SYRINGE | INTRAVENOUS | Status: DC | PRN
  Administered 2023-11-29: 80 ug via INTRAVENOUS
  Administered 2023-11-29: 160 ug via INTRAVENOUS
  Administered 2023-11-29: 80 ug via INTRAVENOUS
  Administered 2023-11-29: 40 ug via INTRAVENOUS
  Administered 2023-11-29 (×2): 80 ug via INTRAVENOUS

## 2023-11-29 MED ORDER — GABAPENTIN 100 MG PO CAPS
100.0000 mg | ORAL_CAPSULE | Freq: Every day | ORAL | Status: DC
  Administered 2023-11-30 – 2023-12-06 (×7): 100 mg via ORAL
  Filled 2023-11-29 (×7): qty 1

## 2023-11-29 MED ORDER — ACETAMINOPHEN 160 MG/5ML PO SOLN: 1000.0000 mg | Freq: Four times a day (QID) | ORAL | Status: AC

## 2023-11-29 MED ORDER — CHLORHEXIDINE GLUCONATE CLOTH 2 % EX PADS
6.0000 | MEDICATED_PAD | Freq: Every day | CUTANEOUS | Status: DC
  Administered 2023-11-29 – 2023-12-04 (×6): 6 via TOPICAL

## 2023-11-29 MED ORDER — CHLORHEXIDINE GLUCONATE 0.12 % MT SOLN
15.0000 mL | Freq: Once | OROMUCOSAL | Status: AC
  Administered 2023-11-29: 15 mL via OROMUCOSAL
  Filled 2023-11-29: qty 15

## 2023-11-29 MED ORDER — INSULIN REGULAR(HUMAN) IN NACL 100-0.9 UT/100ML-% IV SOLN: INTRAVENOUS | Status: DC

## 2023-11-29 MED ORDER — VANCOMYCIN HCL IN DEXTROSE 1-5 GM/200ML-% IV SOLN
1000.0000 mg | Freq: Once | INTRAVENOUS | Status: AC
  Administered 2023-11-29: 1000 mg via INTRAVENOUS
  Filled 2023-11-29: qty 200

## 2023-11-29 MED ORDER — ASPIRIN 81 MG PO CHEW
324.0000 mg | CHEWABLE_TABLET | Freq: Once | ORAL | Status: AC
  Administered 2023-11-29: 324 mg via ORAL
  Filled 2023-11-29: qty 4

## 2023-11-29 MED ORDER — METOPROLOL TARTRATE 5 MG/5ML IV SOLN: 2.5000 mg | INTRAVENOUS | Status: DC | PRN

## 2023-11-29 MED ORDER — MILRINONE LACTATE IN DEXTROSE 20-5 MG/100ML-% IV SOLN
0.3750 ug/kg/min | INTRAVENOUS | Status: DC
  Filled 2023-11-29: qty 100

## 2023-11-29 MED ORDER — SODIUM BICARBONATE 8.4 % IV SOLN
50.0000 meq | Freq: Once | INTRAVENOUS | Status: AC
  Administered 2023-11-29: 50 meq via INTRAVENOUS

## 2023-11-29 MED ORDER — LIDOCAINE 2% (20 MG/ML) 5 ML SYRINGE
INTRAMUSCULAR | Status: DC | PRN
  Administered 2023-11-29: 40 mg via INTRAVENOUS

## 2023-11-29 MED ORDER — HEPARIN SODIUM (PORCINE) 1000 UNIT/ML IJ SOLN
INTRAMUSCULAR | Status: AC
  Filled 2023-11-29: qty 1

## 2023-11-29 MED ORDER — ALBUMIN HUMAN 5 % IV SOLN
250.0000 mL | INTRAVENOUS | Status: DC | PRN
  Administered 2023-11-29 (×4): 12.5 g via INTRAVENOUS
  Filled 2023-11-29 (×3): qty 250

## 2023-11-29 MED ORDER — SODIUM CHLORIDE 0.9% FLUSH
10.0000 mL | Freq: Two times a day (BID) | INTRAVENOUS | Status: DC
  Administered 2023-11-29: 10 mL

## 2023-11-29 MED ORDER — ROCURONIUM BROMIDE 10 MG/ML (PF) SYRINGE
PREFILLED_SYRINGE | INTRAVENOUS | Status: DC | PRN
  Administered 2023-11-29: 60 mg via INTRAVENOUS
  Administered 2023-11-29: 20 mg via INTRAVENOUS
  Administered 2023-11-29: 40 mg via INTRAVENOUS

## 2023-11-29 MED ORDER — METOPROLOL TARTRATE 25 MG/10 ML ORAL SUSPENSION: 12.5000 mg | Freq: Two times a day (BID) | ORAL | Status: DC

## 2023-11-29 MED ORDER — MIDAZOLAM HCL (PF) 5 MG/ML IJ SOLN
INTRAMUSCULAR | Status: DC | PRN
  Administered 2023-11-29: 2 mg via INTRAVENOUS

## 2023-11-29 MED ORDER — LIDOCAINE 2% (20 MG/ML) 5 ML SYRINGE
INTRAMUSCULAR | Status: AC
  Filled 2023-11-29: qty 5

## 2023-11-29 MED ORDER — DOCUSATE SODIUM 100 MG PO CAPS
200.0000 mg | ORAL_CAPSULE | Freq: Every day | ORAL | Status: DC
  Administered 2023-11-30 – 2023-12-07 (×6): 200 mg via ORAL
  Filled 2023-11-29 (×8): qty 2

## 2023-11-29 MED ORDER — FENTANYL CITRATE (PF) 250 MCG/5ML IJ SOLN
INTRAMUSCULAR | Status: DC | PRN
  Administered 2023-11-29 (×4): 50 ug via INTRAVENOUS
  Administered 2023-11-29: 100 ug via INTRAVENOUS
  Administered 2023-11-29 (×2): 50 ug via INTRAVENOUS
  Administered 2023-11-29: 100 ug via INTRAVENOUS

## 2023-11-29 MED ORDER — BISACODYL 5 MG PO TBEC
10.0000 mg | DELAYED_RELEASE_TABLET | Freq: Every day | ORAL | Status: DC
  Administered 2023-11-30 – 2023-12-07 (×4): 10 mg via ORAL
  Filled 2023-11-29 (×7): qty 2

## 2023-11-29 MED ORDER — SODIUM CHLORIDE 0.9% IV SOLUTION: Freq: Once | INTRAVENOUS | Status: AC

## 2023-11-29 MED ORDER — NOREPINEPHRINE 4 MG/250ML-% IV SOLN: 0.0000 ug/min | INTRAVENOUS | Status: DC

## 2023-11-29 MED ORDER — PLASMA-LYTE A IV SOLN
INTRAVENOUS | Status: DC | PRN
  Administered 2023-11-29: 500 mL

## 2023-11-29 MED ORDER — METOPROLOL TARTRATE 12.5 MG HALF TABLET: 12.5000 mg | ORAL_TABLET | Freq: Once | ORAL | Status: DC

## 2023-11-29 MED ORDER — TRAMADOL HCL 50 MG PO TABS
50.0000 mg | ORAL_TABLET | ORAL | Status: DC | PRN
  Administered 2023-11-30 – 2023-12-02 (×4): 50 mg via ORAL
  Administered 2023-12-03: 100 mg via ORAL
  Filled 2023-11-29: qty 1
  Filled 2023-11-29: qty 2
  Filled 2023-11-29 (×3): qty 1

## 2023-11-29 MED ORDER — NITROGLYCERIN IN D5W 200-5 MCG/ML-% IV SOLN: 0.0000 ug/min | INTRAVENOUS | Status: DC

## 2023-11-29 SURGICAL SUPPLY — 69 items
ADAPTER CARDIO PERF ANTE/RETRO (ADAPTER) IMPLANT
ANTIFOG SOL W/FOAM PAD STRL (MISCELLANEOUS) ×2 IMPLANT
BAG DECANTER FOR FLEXI CONT (MISCELLANEOUS) ×2 IMPLANT
BLADE CLIPPER SURG (BLADE) ×2 IMPLANT
BLADE STERNUM SYSTEM 6 (BLADE) ×2 IMPLANT
BLADE SURG 11 STRL SS (BLADE) IMPLANT
BLADE SURG 15 STRL LF DISP TIS (BLADE) IMPLANT
CANISTER SUCT 3000ML PPV (MISCELLANEOUS) ×2 IMPLANT
CANN PRFSN 3/8XCNCT ST RT ANG (MISCELLANEOUS) ×2 IMPLANT
CANNULA AORTIC ROOT 9FR (CANNULA) IMPLANT
CANNULA NON VENT 20FR 12 (CANNULA) ×2 IMPLANT
CANNULA PRFSN 3/8XCNCT RT ANG (MISCELLANEOUS) IMPLANT
CANNULA SUMP PERICARDIAL (CANNULA) ×2 IMPLANT
CANNULA VRC MALB SNGL STG 36FR (MISCELLANEOUS) IMPLANT
CATH RETROPLEGIA CORONARY 14FR (CATHETERS) IMPLANT
CATH ROBINSON RED A/P 18FR (CATHETERS) ×6 IMPLANT
CATH THOR STR 32F SOFT 20 RADI (CATHETERS) ×2 IMPLANT
CATH THORACIC 28FR RT ANG (CATHETERS) ×2 IMPLANT
CLAMP SUTURE YELLOW 5 PAIRS (MISCELLANEOUS) ×2 IMPLANT
CONNECTOR 1/2X3/8X1/2 3WAY (MISCELLANEOUS) IMPLANT
DEVICE SUT CK QUICK LOAD MINI (Prosthesis & Implant Heart) IMPLANT
DRAPE CV SPLIT W-CLR ANES SCRN (DRAPES) ×2 IMPLANT
DRAPE INCISE IOBAN 66X45 STRL (DRAPES) ×2 IMPLANT
DRAPE PERI GROIN 82X75IN TIB (DRAPES) ×2 IMPLANT
DRSG AQUACEL AG ADV 3.5X10 (GAUZE/BANDAGES/DRESSINGS) ×2 IMPLANT
ELECT CAUTERY BLADE 6.4 (BLADE) ×2 IMPLANT
ELECT REM PT RETURN 9FT ADLT (ELECTROSURGICAL) ×4 IMPLANT
ELECTRODE REM PT RTRN 9FT ADLT (ELECTROSURGICAL) ×4 IMPLANT
FELT TEFLON 1X6 (MISCELLANEOUS) ×2 IMPLANT
GAUZE SPONGE 4X4 12PLY STRL (GAUZE/BANDAGES/DRESSINGS) ×2 IMPLANT
GOWN STRL REUS W/ TWL LRG LVL3 (GOWN DISPOSABLE) ×12 IMPLANT
INSERT FOGARTY XLG (MISCELLANEOUS) ×2 IMPLANT
KIT BASIN OR (CUSTOM PROCEDURE TRAY) ×2 IMPLANT
KIT SUT CK MINI COMBO 4X17 (Prosthesis & Implant Heart) IMPLANT
KIT TURNOVER KIT B (KITS) ×2 IMPLANT
LINE VENT (MISCELLANEOUS) IMPLANT
NS IRRIG 1000ML POUR BTL (IV SOLUTION) ×12 IMPLANT
ORGANIZER SUTURE GABBAY-FRATER (MISCELLANEOUS) ×2 IMPLANT
PACK E OPEN HEART (SUTURE) ×2 IMPLANT
PACK OPEN HEART (CUSTOM PROCEDURE TRAY) ×2 IMPLANT
PAD ARMBOARD 7.5X6 YLW CONV (MISCELLANEOUS) ×4 IMPLANT
PAD ELECT DEFIB RADIOL ZOLL (MISCELLANEOUS) ×2 IMPLANT
PENCIL BUTTON HOLSTER BLD 10FT (ELECTRODE) ×2 IMPLANT
POSITIONER HEAD DONUT 9IN (MISCELLANEOUS) ×2 IMPLANT
SET MPS 3-ND DEL (MISCELLANEOUS) IMPLANT
SOLUTION ANTFG W/FOAM PAD STRL (MISCELLANEOUS) ×2 IMPLANT
SUT ETHIBOND 2 0 SH (SUTURE) IMPLANT
SUT ETHIBOND 2 0 SH 36X2 (SUTURE) IMPLANT
SUT ETHIBOND 3 0 SH 1 (SUTURE) IMPLANT
SUT MNCRL AB 4-0 PS2 18 (SUTURE) ×4 IMPLANT
SUT PROLENE 4 0 SH DA (SUTURE) ×6 IMPLANT
SUT PROLENE 4-0 RB1 .5 CRCL 36 (SUTURE) ×4 IMPLANT
SUT STEEL 6MS V (SUTURE) ×2 IMPLANT
SUT STEEL SZ 6 DBL 3X14 BALL (SUTURE) ×4 IMPLANT
SUT VIC AB 0 CTX36XBRD ANTBCTR (SUTURE) ×4 IMPLANT
SUT VIC AB 2-0 CT1 TAPERPNT 27 (SUTURE) ×4 IMPLANT
SYR BULB IRRIG 60ML STRL (SYRINGE) IMPLANT
SYSTEM SAHARA CHEST DRAIN ATS (WOUND CARE) ×2 IMPLANT
TAG SUTURE CLAMP YLW 5PR (MISCELLANEOUS) ×2 IMPLANT
TAPE CLOTH SURG 4X10 WHT LF (GAUZE/BANDAGES/DRESSINGS) IMPLANT
TAPE PAPER 2X10 WHT MICROPORE (GAUZE/BANDAGES/DRESSINGS) IMPLANT
TOWEL GREEN STERILE (TOWEL DISPOSABLE) ×2 IMPLANT
TOWEL GREEN STERILE FF (TOWEL DISPOSABLE) ×2 IMPLANT
TRAY FOLEY SLVR 14FR TEMP STAT (SET/KITS/TRAYS/PACK) IMPLANT
TRAY FOLEY SLVR 16FR TEMP STAT (SET/KITS/TRAYS/PACK) ×2 IMPLANT
UNDERPAD 30X36 HEAVY ABSORB (UNDERPADS AND DIAPERS) ×2 IMPLANT
VALVE ST JUDE MITRAL 85D 27X (Prosthesis & Implant Heart) IMPLANT
VRC MALLEABLE SINGLE STG 36FR (MISCELLANEOUS) ×2 IMPLANT
WATER STERILE IRR 1000ML POUR (IV SOLUTION) ×4 IMPLANT

## 2023-11-29 NOTE — Procedures (Signed)
 Extubation Procedure Note  Patient Details:   Name: JOSHLYNN ALFONZO DOB: 04/18/1961 MRN: [id number]   Airway Documentation:    Vent end date: 11/29/23 Vent end time: 1652   Evaluation  O2 sats: stable throughout Complications: No apparent complications Patient did tolerate procedure well. Bilateral Breath Sounds: Clear   Yes Positive cuff leak prior. NIF -30 Pt on 4L [place] Dena VEAR Samuel 11/29/2023, 4:53 PM

## 2023-11-29 NOTE — Anesthesia Procedure Notes (Signed)
 Arterial Line Insertion Start/End2/02/2024 8:48 AM, 11/29/2023 8:58 AM Performed by: Leopoldo Bruckner, MD  Patient location: OR. Preanesthetic checklist: patient identified, IV checked, site marked, risks and benefits discussed, surgical consent, monitors and equipment checked, pre-op evaluation, timeout performed and anesthesia consent Left, brachial was placed Catheter size: 5 French Hand hygiene performed  and maximum sterile barriers used   Attempts: 1 Procedure performed using ultrasound guided technique. Ultrasound Notes:anatomy identified, needle tip was noted to be adjacent to the nerve/plexus identified and no ultrasound evidence of intravascular and/or intraneural injection Following insertion, dressing applied and Biopatch. Post procedure assessment: normal and unchanged  Patient tolerated the procedure well with no immediate complications.

## 2023-11-29 NOTE — Anesthesia Procedure Notes (Signed)
 Central Venous Catheter Insertion Performed by: Leopoldo Bruckner, MD, anesthesiologist Start/End2/02/2024 8:28 AM, 11/29/2023 8:40 AM Patient location: OR. Preanesthetic checklist: patient identified, IV checked, site marked, risks and benefits discussed, surgical consent, monitors and equipment checked, pre-op evaluation, timeout performed and anesthesia consent Position: supine Lidocaine  1% used for infiltration and patient sedated Hand hygiene performed  and maximum sterile barriers used  Catheter size: 9 Fr Total catheter length 10. MAC introducer Procedure performed using ultrasound guided technique. Ultrasound Notes:anatomy identified, needle tip was noted to be adjacent to the nerve/plexus identified, no ultrasound evidence of intravascular and/or intraneural injection and image(s) printed for medical record Attempts: 1 Following insertion, line sutured and dressing applied. Post procedure assessment: blood return through all ports, free fluid flow and no air  Patient tolerated the procedure well with no immediate complications.

## 2023-11-29 NOTE — Anesthesia Procedure Notes (Signed)
 Procedure Name: Intubation Date/Time: 11/29/2023 8:29 AM  Performed by: Joshua Millman, CRNAPre-anesthesia Checklist: Patient identified, Emergency Drugs available, Suction available and Patient being monitored Patient Re-evaluated:Patient Re-evaluated prior to induction Oxygen Delivery Method: Circle system utilized Preoxygenation: Pre-oxygenation with 100% oxygen Induction Type: IV induction Ventilation: Mask ventilation without difficulty and Oral airway inserted - appropriate to patient size Laryngoscope Size: Mac and 3 Grade View: Grade I Tube type: Oral Tube size: 8.0 mm Number of attempts: 1 Airway Equipment and Method: Stylet and Oral airway Placement Confirmation: ETT inserted through vocal cords under direct vision, positive ETCO2 and breath sounds checked- equal and bilateral Secured at: 22 cm Tube secured with: Tape Dental Injury: Teeth and Oropharynx as per pre-operative assessment

## 2023-11-29 NOTE — Hospital Course (Addendum)
 Referring Provider:  West, [name] D, NP PCP:  de [place], Raymond J, MD  (Follow up appts and Echo requested. Will need appt. for Coumadin  Clinic)  History of Present Illness:     Pt is a 63 yo woman with recent admission last November for severe rhuematic MS. Pt was admitted with bronchopneumonia and on work up was found to have MS with a mean gradient of 11 mmHG and normal LV function. No significant MR. Pt with long history of afib on coumadin  and had previous LAD stenting and MV balloon valvuloplasty in [place] in [place]. Pt has suffered a stroke in the past. She currently has DOE with walking stairs and also occasional rest CP. She had cath without CAD and moderate PHTN. Pt was felt best to be served with MV replacement and has been sent for consideration. Her son was serving as interpreter and called his uncle in [place] who is an intensivist. We discussed her issues over the phone and he then discussed this with her son and herself. PT is very nervous about surgery and became tearful.  63 yo female with 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 Pt with long standing afib and on coumadin  would best be served at her age with MV mechanical replacement and consideration of TV ring annuloplasty due to moderate TR and PHTN. She will need to be bridged with lovenox  for 5 days prior to surgery on 2/5. We discussed all the risks and goals and recovery from surgery and it appears they understand and wish to proceed.   Hospital Course: Ms. Balint was admitted for elective surgery on 11/29/23 and taken to the OR where the rheumatic mitral valve was replaced with with a 27mm [employer] mechanical valve.  Following the procedure she was separated from cardiopulmonary bypass without difficulty and was transferred to the ICU in stable condition.    Postoperative hospital course:  She was extubated using standard post cardiac surgical protocols without difficulty.  She  has maintained stable hemodynamics initially requiring nor epi and milrinone  but these were weaned off over time without difficulty.  She had moderate chest tube drainage and chest tubes were left in place on postop day #1.  She does have an expected acute blood loss anemia and additionally has received PRBCs.  She has volume overload related to surgery and not clinically significant responding appropriately to routine diuresis.  On postop day 2 her creatinine bumped to 1.27 diuresis was stopped at that time with ongoing plans to follow and potentially reinitiate at needed Coumadin  was initiated on postop day #2.  Atrial fibrillation was rate controlled initially and amiodarone  was continued.  She then developed some bradycardia into the 30s to 50s.  Pacer wires have been kept in place. Lasix  was increased and Metolazone  was added for further diuresis on 02/09. Foley was removed on 02/10.  She had an excellent response to this.  Lasix  was discontinued on postop day 6 as she appeared euvolemic.  Renal function is now in the normal range.  Thrombocytopenia has resolved.  Blood sugars have been under good control using standard sliding scale with plans to resume Glucophage  at time of discharge.  She has been evaluated by both physical and Occupational Therapy and home health is being arranged as it is DME equipment.  Oxygen has been weaned and she maintains good saturations on room air.  Clinical bronchitis has shown a steady improvement over time and lung  fields are currently clear.  Most recent INR on 12/07/2023 is 2.4 at discharge she will be placed on previous dosing with plans to get INR next week at the Coumadin  clinic.  Incisions healing well without evidence of infection.  She is tolerating diet.  Overall, at the time of discharge she is felt to be stable.

## 2023-11-29 NOTE — Transfer of Care (Signed)
 Immediate Anesthesia Transfer of Care Note  Patient: Christy Poole  Procedure(s) Performed: MITRAL VALVE (MV) REPLACEMENT USING [employer] MECHANICAL VALVE (Chest) TRANSESOPHAGEAL ECHOCARDIOGRAM (TEE)  Patient Location: ICU  Anesthesia Type:General  Level of Consciousness: Patient remains intubated per anesthesia plan  Airway & Oxygen Therapy: Patient remains intubated per anesthesia plan and Patient placed on Ventilator (see vital sign flow sheet for setting)  Post-op Assessment: Report given to RN and Post -op Vital signs reviewed and stable  Post vital signs: Reviewed and stable  Last Vitals:  Vitals Value Taken Time  BP 94/56 1159  Temp 36.1 1159  Pulse 71 1159  Resp 16 1159  SpO2 100 1159    Last Pain: There were no vitals filed for this visit.       Complications: No notable events documented.

## 2023-11-29 NOTE — Anesthesia Procedure Notes (Signed)
 Central Venous Catheter Insertion Performed by: Leopoldo Bruckner, MD, anesthesiologist Start/End2/02/2024 8:28 AM, 11/29/2023 8:40 AM Patient location: OR. Preanesthetic checklist: patient identified, IV checked, risks and benefits discussed, surgical consent, monitors and equipment checked, pre-op evaluation, timeout performed and anesthesia consent Hand hygiene performed  and maximum sterile barriers used  PA cath was placed.Swan type:thermodilution Procedure performed without using ultrasound guided technique. Attempts: 1 Patient tolerated the procedure well with no immediate complications.

## 2023-11-29 NOTE — Brief Op Note (Signed)
 11/29/2023  11:21 AM  PATIENT:  Christy Poole  63 y.o. female  PRE-OPERATIVE DIAGNOSIS:  MITRAL VALVE STENOSIS,  ATRIAL FIBRILLATION  POST-OPERATIVE DIAGNOSIS:  MITRAL VALVE STENOSIS,  ATRIAL FIBRILLATION  PROCEDURE:   MITRAL VALVE REPLACEMENT USING ST J[employer]DE MA[employer]ERS MECHANICAL VALVE   TRANSESOPHAGEAL ECHOCARDIOGRAM   SURGEON: Maryjane Mt, MD   PHYSICIAN ASSISTANT: Haili Donofrio  ASSISTANTS: Bruins, Brooklyn L, Scrub Person  Sandwich, Waddell PARAS, RN, Scrub Person  Price, Evalene [name], RN, RN First Assistant   ANESTHESIA:   general  EBL:  485 mL   BLOOD ADMINISTERED:none  DRAINS:  mediastinal and pleural drains    LOCAL MEDICATIONS USED:  NONE  SPECIMEN:  Mitral valve leaflets, cords  DISPOSITION OF SPECIMEN:  PATHOLOGY  COUNTS:  correct  DICTATION: .Dragon Dictation  PLAN OF CARE: Admit to inpatient   PATIENT DISPOSITION:  ICU - intubated and hemodynamically stable.   Delay start of Pharmacological VTE agent (>24hrs) due to surgical blood loss or risk of bleeding: yes

## 2023-11-29 NOTE — Consult Note (Signed)
 NAME:  Christy Poole, MRN:  [id number], DOB:  [birth year], LOS: 0 ADMISSION DATE:  11/29/2023, CONSULTATION DATE: 11/28/2022 REFERRING MD: [name], CHIEF COMPLAINT: Status post mitral valve replacement  History of Present Illness:  63 year old female with chronic A-fib, diabetes type 2, coronary artery disease status post stent and rheumatic mitral valve stenosis who started with dyspnea on exertion, today she underwent mechanical mitral valve replacement and closure of left atrial appendage, remained intubated and was transferred to ICU.  PCCM was consulted for help evaluation medical management  Pertinent  Medical History   Past Medical History:  Diagnosis Date   Atrial fibrillation (HCC)    Coronary artery disease    s/p LAD stents   Diabetes mellitus without complication (HCC)    Dyspnea    Headache    HLD (hyperlipidemia)    Hypertension    Memory loss    due to stroke in 2024   Neuromuscular disorder (HCC)    feet   Pneumonia    x 1   Seizure (HCC)    last one was in 09/2023   Stroke [facility]) 2019   Thyroid disease    Walker as ambulation aid      Significant Hospital Events: Including procedures, antibiotic start and stop dates in addition to other pertinent events     Interim History / Subjective:  As above  Objective   Blood pressure (!) 149/94, pulse 77, temperature 98 F (36.7 C), resp. rate 16, height 4' 11 (1.499 m), weight 48 kg, SpO2 93%.    Vent Mode: SIMV;PSV;PRVC FiO2 (%):  [50 %] 50 % Set Rate:  [16 bmp] 16 bmp Vt Set:  [370 mL] 370 mL PEEP:  [5 cmH20] 5 cmH20 Pressure Support:  [10 cmH20] 10 cmH20 Plateau Pressure:  [20 cmH20] 20 cmH20   Intake/Output Summary (Last 24 hours) at 11/29/2023 1211 Last data filed at 11/29/2023 1126 Gross per 24 hour  Intake 1930 ml  Output 1335 ml  Net 595 ml   Filed Weights   11/29/23 0710  Weight: 48 kg    Examination: General: Crtitically ill-appearing elderly female, orally intubated HEENT: [place]/AT,  eyes anicteric.  ETT and OGT in place Neuro: Sedated, not following commands.  Eyes are closed.  Pupils 3 mm bilateral reactive to light Chest: Central sternotomy incision looks clean and dry, coarse breath sounds, no wheezes or rhonchi.  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 Chronic A-fib status post left atrial appendage closure Coronary artery disease Continue aspirin  and statin Chest tube management TCTS Continue to titrate Precedex  with RASS goal 0/-1 Continue pain control with tramadol , oxycodone  and morphine  Closely monitor chest tube output Currently on milrinone  and low-dose Levophed , continue to titrate with MAP goal 65 Follow-up Coox  Acute respiratory insufficiency, postop Continue on protective ventilation VAP prevention bundle in place Rapid weaning protocol ordered is in place  Hypertension Holding antihypertensive for now, as patient is requiring low-dose Levophed   Hyperlipidemia Continue atorvastatin   Diabetes type 2 Patient hemoglobin A1c is 5.8 Currently on insulin  infusion, monitor fingerstick with goal 140-180  Expected perioperative blood loss anemia 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 Monitor H/H and PLT counts   Best Practice (right click and Reselect all SmartList Selections daily)  Diet/type: NPO DVT prophylaxis: SCD GI prophylaxis: PPI Lines: Central line, Arterial Line, and yes and it is still needed Foley:  Yes, and it is still needed Code Status:  full code Last date of multidisciplinary goals of care discussion [Per primary team]   Labs   CBC: Recent Labs  Lab 11/27/23 1155 11/29/23 0918 11/29/23 1008 11/29/23 1032 11/29/23 1033 11/29/23 1108  11/29/23 1111  WBC 5.4  --   --   --   --   --   --   HGB 12.9   < > 7.1* 7.0* 7.1* 7.1* 7.5*  HCT 40.1   < > 21.0* 21.2* 21.0* 21.0* 22.0*  MCV 89.1  --   --   --   --   --   --   PLT 166  --   --  73*  --   --   --    < > = values in this interval not displayed.    Basic Metabolic Panel: Recent Labs  Lab 11/27/23 1155 11/29/23 0918 11/29/23 0930 11/29/23 0941 11/29/23 0956 11/29/23 1008 11/29/23 1033 11/29/23 1108 11/29/23 1111  NA 143   < > 139 136 138 140 139 138 138  K 4.8   < > 3.5 3.5 4.1 4.2 4.2 4.2 4.2  CL 108  --  106 103  --   --  103  --  102  CO2 25  --   --   --   --   --   --   --   --   GLUCOSE 103*  --  125* 189*  --   --  119*  --  117*  BUN 12  --  11 11  --   --  10  --  10  CREATININE 1.03*  --  0.60 0.60  --   --  0.50  --  0.60  CALCIUM  10.4*  --   --   --   --   --   --   --   --    < > = values in this interval not displayed.   GFR: Estimated Creatinine Clearance: 48.5 mL/min (by C-G formula based on SCr of 0.6 mg/dL). Recent Labs  Lab 11/27/23 1155  WBC 5.4    Liver Function Tests: Recent Labs  Lab 11/27/23 1155  AST 36  ALT 30  ALKPHOS 64  BILITOT 1.3*  PROT 7.1  ALBUMIN  3.9   No results for input(s): LIPASE, AMYLASE in the last 168 hours. No results for input(s): AMMONIA in the last 168 hours.  ABG    Component Value Date/Time   PHART 7.339 (L) 11/29/2023 1108   PCO2ART 44.4 11/29/2023 1108   PO2ART 281 (H) 11/29/2023 1108   HCO3 23.9 11/29/2023 1108   TCO2 25 11/29/2023 1111   ACIDBASEDEF 2.0 11/29/2023 1108   O2SAT 100 11/29/2023 1108     Coagulation Profile: Recent Labs  Lab 11/27/23 1155 11/29/23 0728  INR 1.4* 1.1    Cardiac Enzymes: No results for input(s): CKTOTAL, CKMB, CKMBINDEX, TROPONINI in the last 168 hours.  HbA1C: Hgb A1c MFr Bld  Date/Time Value Ref Range Status  11/27/2023 11:55 AM 5.8 (H) 4.8 - 5.6 % Final    Comment:    (NOTE) Pre diabetes:          5.7%-6.4%  Diabetes:               >6.4%  Glycemic control for   <7.0% adults with diabetes  09/12/2023 02:33 AM 7.0 (H) 4.8 - 5.6 % Final    Comment:    (NOTE) Pre diabetes:          5.7%-6.4%  Diabetes:              >6.4%  Glycemic control for   <7.0% adults with diabetes     CBG: Recent Labs  Lab 11/27/23 1104 11/29/23 0709 11/29/23 1204  GLUCAP 121* 89 122*    Review of Systems:   Unable to obtain as patient is intubated and sedated  Past Medical History:  She,  has a past medical history of Atrial fibrillation (HCC), Coronary artery disease, Diabetes mellitus without complication (HCC), Dyspnea, Headache, HLD (hyperlipidemia), Hypertension, Memory loss, Neuromuscular disorder (HCC), Pneumonia, Seizure (HCC), Stroke (HCC) (2019), Thyroid disease, and Walker as ambulation aid.   Surgical History:   Past Surgical History:  Procedure Laterality Date   RIGHT/LEFT HEART CATH AND CORONARY ANGIOGRAPHY N/A 09/07/2023   Procedure: RIGHT/LEFT HEART CATH AND CORONARY ANGIOGRAPHY;  Surgeon: Wendel Lurena POUR, MD;  Location: [facility] INVASIVE CV LAB;  Service: Cardiovascular;  Laterality: N/A;   TRANSESOPHAGEAL ECHOCARDIOGRAM (CATH LAB) N/A 10/03/2023   Procedure: TRANSESOPHAGEAL ECHOCARDIOGRAM;  Surgeon: Raford Riggs, MD;  Location: [facility] INVASIVE CV LAB;  Service: Cardiovascular;  Laterality: N/A;     Social History:   reports that she has never smoked. She has never been exposed to tobacco smoke. She has never used smokeless tobacco. She reports that she does not drink alcohol and does not use drugs.   Family History:  Her family history is not on file.   Allergies Allergies  Allergen Reactions   Other Other (See Comments)    Patient is vegetarian no meat can eat eggs     Home Medications  Prior to Admission medications   Medication Sig Start Date End Date Taking? Authorizing Provider  acetaminophen  (TYLENOL ) 500 MG tablet Take 500 mg by mouth every 6 (six) hours as needed for mild pain (pain  score 1-3), headache or fever.   Yes [provider]  albuterol  (VENTOLIN  HFA) 108 (90 Base) MCG/ACT inhaler Inhale 1-2 puffs into the lungs every 4 (four) hours as needed for wheezing or shortness of breath. 11/18/22 06/10/24 Yes Kassie Acquanetta Bradley, MD  aspirin  EC (ASPIRIN  LOW DOSE) 81 MG tablet TAKE 1 TABLET (81 MG TOTAL) BY MOUTH DAILY. (SWALLOW WHOLE) 06/30/23  Yes Lonni Slain, MD  atorvastatin  (LIPITOR ) 80 MG tablet TAKE 1 TABLET (80 MG TOTAL) BY MOUTH DAILY. 06/30/23  Yes Lonni Slain, MD  cetaphil (CETAPHIL) lotion Apply 1 Application topically daily as needed for dry skin.   Yes [provider]  enoxaparin  (LOVENOX ) 40 MG/0.4ML injection Inject 0.4 mLs (40 mg total) into the skin every 12 (twelve) hours. 11/15/23  Yes Wonda Sharper, MD  furosemide  (LASIX ) 40 MG tablet Take 1 tablet (40 mg total) by mouth daily. 09/13/23  Yes Odell Celinda Balo, MD  gabapentin  (NEURONTIN ) 100 MG capsule Take 1 capsule (100 mg total) by mouth at bedtime. 07/12/23  Yes Rosemarie Eather RAMAN, MD  levETIRAcetam  (KEPPRA ) 250 MG tablet Take 1 tablet (250 mg total) by mouth 2 (two) times daily. 11/22/23  Yes McCue, Harlene, NP  levothyroxine  (SYNTHROID ) 50 MCG tablet TAKE 1 TABLET (50 MCG TOTAL) BY MOUTH AT BEDTIME. 10/09/23  Yes de [place], Raymond J, MD  metFORMIN  (GLUCOPHAGE -XR) 500 MG 24 hr tablet TAKE 1 TABLET (500 MG TOTAL) BY MOUTH DAILY WITH BREAKFAST. 10/09/23  Yes de [place], Raymond J, MD  metoprolol   succinate (TOPROL -XL) 50 MG 24 hr tablet Take 3 tablets (150 mg total) by mouth daily. Patient taking differently: Take 75 mg by mouth daily. 09/13/23  Yes Odell Celinda Balo, MD  mometasone -formoterol  (DULERA ) 100-5 MCG/ACT AERO Inhale 2 puffs into the lungs in the morning and at bedtime. Patient taking differently: Inhale 2 puffs into the lungs 2 (two) times daily as needed for wheezing or shortness of breath. 03/16/23  Yes Kassie Acquanetta Bradley, MD  spironolactone  (ALDACTONE ) 25 MG tablet  Take 0.5 tablets (12.5 mg total) by mouth daily. 09/13/23  Yes Odell Celinda Balo, MD  warfarin (COUMADIN ) 2.5 MG tablet TAKE 1-2 TABLETS DAILY OR AS PRESCRIBED BY COUMADIN  CLINIC Patient taking differently: Take 2.5-3.75 mg by mouth See admin instructions. Take 3.75 mg on Thurs, Fri, Sat, Mon, Tues, Wed. Take 2.5 mg on Sun. 05/26/23  Yes Lonni Slain, MD     Critical care time:      The patient is critically ill due to severe mitral stenosis status post mitral valve replacement.  Critical care was necessary to treat or prevent imminent or life-threatening deterioration.  Critical care was time spent personally by me on the following activities: development of treatment plan with patient and/or surrogate as well as nursing, discussions with consultants, evaluation of patient's response to treatment, examination of patient, obtaining history from patient or surrogate, ordering and performing treatments and interventions, ordering and review of laboratory studies, ordering and review of radiographic studies, pulse oximetry, re-evaluation of patient's condition and participation in multidisciplinary rounds.   During this encounter critical care time was devoted to patient care services described in this note for 35 minutes.     Valinda Novas, MD [facility] See Amion for pager If no response to pager, please call [phone] until 7pm After 7pm, Please call E-link [phone]

## 2023-11-29 NOTE — Op Note (Signed)
 CARDIOVASCULAR SURGERY OPERATIVE NOTE  11/29/2023 Christy Poole [id number]  Surgeon:  Deward LELON Kallman, MD  First Assistant: Laurel Becket [facility]                               An experienced assistant was required given the complexity of this surgery and the standard of surgical care. The assistant was needed for exposure, dissection, suctioning, retraction of delicate tissues and sutures, instrument exchange and for overall help during this procedure.     Preoperative Diagnosis: Rheurmatic Mitral Stenosis   Postoperative Diagnosis:  Same   Procedure: Mitral Valve Replacement with a 27 mm St Jude Mechanical Prosthesis Closure of LAA  Anesthesia:  General Endotracheal   Clinical History/Surgical Indication:  63 yo female with 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 Pt with long standing afib and on coumadin  would best be served at her age with MV mechanical replacement and consideration of TV ring annuloplasty due to moderate TR and PHTN.   Findings: There is normal ventricular function.  The tricuspid valve was mild to moderately regurgitant and was not felt to require intervention.  The mitral valve had classic rheumatic stenosis and at the conclusion of the replacement there was a well-seated chemical prosthesis with a mean gradient of 4 mmHg  Preparation:  The patient was seen in the preoperative holding area and the correct patient, correct operation were confirmed with the patient after reviewing the medical record and catheterization. The consent was signed by me. Preoperative antibiotics were given. A pulmonary arterial line and radial arterial line were placed by the anesthesia team. The patient was taken back to the operating room and positioned supine on the operating room table. After being placed under general endotracheal anesthesia by the anesthesia team a foley catheter was placed. The neck, chest, abdomen, and both  legs were prepped with betadine soap and solution and draped in the usual sterile manner. A surgical time-out was taken and the correct patient and operative procedure were confirmed with the nursing and anesthesia staff.  Operation: A median sternotomy incision was then created sternal valve the sternal saw.  Simultaneously heparin  was delivered and a pericardial well developed.  The aorta was cannulated with a 20 French Starns aortic cannula and a single 36 French straight cannulas placed in the right atrial appendage and directed towards the inferior vena cava.  An additional 36 French right angle cannula was placed in the superior vena cava.  An antegrade cardioplegia catheter was placed in the ascending aorta.  With adequate confirmation of anticoagulation cardiopulmonary bypass was instituted. Aortic cross-clamp was placed and [place] blood cardioplegia was delivered for approximately 900 cc.  Reanimation dose was delivered just prior to cross-clamp removal. The left atrium was opened in the intra-atrial groove and excellent exposure of the mitral valve was obtained.  The left atrial appendage was oversewn with a running 4-0 Prolene suture. The mitral valve was excised and utilizing sixteen 2-0 [place] pledgeted sutures with the pledgets on the atrial surface a 27 mm Saint Jude mechanical prosthesis was secured utilizing the core knot system. The left atrium was then closed over a ventricular sump with the patient in headdown position aortic cross-clamp was removed. Ventricular pacing wires were placed and brought out through inferior stab wounds and secured. Patient was then weaned from cardiopulmonary bypass on no inotropic support.  With adequate hemodynamics and valve function  protamine  was delivered and the patient was decannulated and sites oversewn were necessary.  Chest tubes were brought inferior stab was and secured. With adequate hemostasis the sternum was reapproximated with interrupted  stainless steel wires and the presternal subcutaneous tissue and skin were closed in multiple layers absorbable suture.  Sterile dressings were applied.

## 2023-11-29 NOTE — Interval H&P Note (Signed)
 History and Physical Interval Note:  11/29/2023 6:51 AM  Christy Poole  has presented today for surgery, with the diagnosis of MITRAL VALVE STENOSIS TR AFIB.  The various methods of treatment have been discussed with the patient and family. After consideration of risks, benefits and other options for treatment, the patient has consented to  Procedure(s): MITRAL VALVE (MV) REPLACEMENT (N/A) TRICUSPID VALVE REPAIR (N/A) TRANSESOPHAGEAL ECHOCARDIOGRAM (TEE) (N/A) as a surgical intervention.  The patient's history has been reviewed, patient examined, no change in status, stable for surgery.  I have reviewed the patient's chart and labs.  Questions were answered to the patient's satisfaction.     [name]

## 2023-11-30 ENCOUNTER — Encounter (HOSPITAL_COMMUNITY): Payer: Self-pay | Admitting: Thoracic Surgery (Cardiothoracic Vascular Surgery)

## 2023-11-30 ENCOUNTER — Other Ambulatory Visit: Payer: Self-pay | Admitting: Physician Assistant

## 2023-11-30 ENCOUNTER — Inpatient Hospital Stay (HOSPITAL_COMMUNITY): Payer: Medicaid Other

## 2023-11-30 ENCOUNTER — Encounter (HOSPITAL_COMMUNITY): Payer: Self-pay

## 2023-11-30 DIAGNOSIS — E119 Type 2 diabetes mellitus without complications: Secondary | ICD-10-CM | POA: Diagnosis not present

## 2023-11-30 DIAGNOSIS — Z952 Presence of prosthetic heart valve: Secondary | ICD-10-CM | POA: Diagnosis not present

## 2023-11-30 LAB — GLUCOSE, CAPILLARY
Glucose-Capillary: 104 mg/dL — ABNORMAL HIGH (ref 70–99)
Glucose-Capillary: 109 mg/dL — ABNORMAL HIGH (ref 70–99)
Glucose-Capillary: 120 mg/dL — ABNORMAL HIGH (ref 70–99)
Glucose-Capillary: 120 mg/dL — ABNORMAL HIGH (ref 70–99)
Glucose-Capillary: 120 mg/dL — ABNORMAL HIGH (ref 70–99)
Glucose-Capillary: 137 mg/dL — ABNORMAL HIGH (ref 70–99)
Glucose-Capillary: 164 mg/dL — ABNORMAL HIGH (ref 70–99)
Glucose-Capillary: 166 mg/dL — ABNORMAL HIGH (ref 70–99)
Glucose-Capillary: 78 mg/dL (ref 70–99)
Glucose-Capillary: 80 mg/dL (ref 70–99)
Glucose-Capillary: 89 mg/dL (ref 70–99)
Glucose-Capillary: 95 mg/dL (ref 70–99)

## 2023-11-30 LAB — BASIC METABOLIC PANEL
Anion gap: 8 (ref 5–15)
BUN: 9 mg/dL (ref 8–23)
CO2: 22 mmol/L (ref 22–32)
Calcium: 8.2 mg/dL — ABNORMAL LOW (ref 8.9–10.3)
Chloride: 107 mmol/L (ref 98–111)
Creatinine, Ser: 0.77 mg/dL (ref 0.44–1.00)
GFR, Estimated: >60 mL/min (ref >60)
Glucose, Bld: 122 mg/dL — ABNORMAL HIGH (ref 70–99)
Potassium: 4.1 mmol/L (ref 3.5–5.1)
Sodium: 137 mmol/L (ref 135–145)

## 2023-11-30 LAB — CBC
HCT: 25.5 % — ABNORMAL LOW (ref 36.0–46.0)
Hemoglobin: 8.2 g/dL — ABNORMAL LOW (ref 12.0–15.0)
MCH: 28.4 pg (ref 26.0–34.0)
MCHC: 32.2 g/dL (ref 30.0–36.0)
MCV: 88.2 fL (ref 80.0–100.0)
Platelets: 60 10*3/uL — ABNORMAL LOW (ref 150–400)
RBC: 2.89 MIL/uL — ABNORMAL LOW (ref 3.87–5.11)
RDW: 14 % (ref 11.5–15.5)
WBC: 11.4 10*3/uL — ABNORMAL HIGH (ref 4.0–10.5)
nRBC: 0 % (ref 0.0–0.2)

## 2023-11-30 LAB — MAGNESIUM: Magnesium: 2.8 mg/dL — ABNORMAL HIGH (ref 1.7–2.4)

## 2023-11-30 MED ORDER — AMIODARONE HCL 200 MG PO TABS
400.0000 mg | ORAL_TABLET | Freq: Two times a day (BID) | ORAL | Status: DC
  Administered 2023-11-30 (×2): 400 mg via ORAL
  Filled 2023-11-30 (×2): qty 2

## 2023-11-30 MED ORDER — FUROSEMIDE 10 MG/ML IJ SOLN
20.0000 mg | Freq: Two times a day (BID) | INTRAMUSCULAR | Status: DC
Start: 2023-11-30 — End: 2023-12-03
  Administered 2023-11-30 – 2023-12-03 (×6): 20 mg via INTRAVENOUS
  Filled 2023-11-30 (×6): qty 2

## 2023-11-30 MED ORDER — INSULIN ASPART 100 UNIT/ML IJ SOLN
0.0000 [IU] | INTRAMUSCULAR | Status: DC
  Administered 2023-11-30 (×2): 4 [IU] via SUBCUTANEOUS
  Administered 2023-11-30 – 2023-12-02 (×6): 2 [IU] via SUBCUTANEOUS

## 2023-11-30 MED ORDER — IPRATROPIUM-ALBUTEROL 0.5-2.5 (3) MG/3ML IN SOLN
3.0000 mL | Freq: Two times a day (BID) | RESPIRATORY_TRACT | Status: DC
  Administered 2023-11-30 – 2023-12-03 (×8): 3 mL via RESPIRATORY_TRACT
  Filled 2023-11-30 (×9): qty 3

## 2023-11-30 MED FILL — Potassium Chloride Inj 2 mEq/ML: INTRAVENOUS | Qty: 40 | Status: AC

## 2023-11-30 MED FILL — Lidocaine HCl Local Preservative Free (PF) Inj 2%: INTRAMUSCULAR | Qty: 14 | Status: AC

## 2023-11-30 MED FILL — Heparin Sodium (Porcine) Inj 1000 Unit/ML: Qty: 1000 | Status: AC

## 2023-11-30 NOTE — Plan of Care (Signed)
  Problem: Clinical Measurements: Goal: Cardiovascular complication will be avoided Outcome: Progressing   Problem: Activity: Goal: Risk for activity intolerance will decrease Outcome: Progressing   Problem: Pain Managment: Goal: General experience of comfort will improve and/or be controlled Outcome: Progressing

## 2023-11-30 NOTE — Progress Notes (Signed)
 Echo post MVR

## 2023-11-30 NOTE — TOC Initial Note (Signed)
 Transition of Care [facility]) - Initial/Assessment Note    Patient Details  Name: Christy Poole MRN: [id number] Date of Birth: 05-12-1961  [facility] CM/SW Contact:    Sudie Erminio Deems, RN Phone Number: 11/30/2023, 1:04 PM  Clinical Narrative: Patient POD-1 MVR. PTA patient was from home with son and daughter-in-law. Per family, patient has DME rolling walker in the home. [facility] has PCP and insurance. [facility] will follow the patient for home health PT per office protocol. Case Manager will continue to follow for additional transition of care needs as the patient progresses.                  Expected Discharge Plan: Home w Home Health Services Barriers to Discharge: Continued Medical Work up   Patient Goals and CMS Choice Patient states their goals for this hospitalization and ongoing recovery are:: plan to return home   Expected Discharge Plan and Services   Discharge Planning Services: CM Consult Post Acute Care Choice: Home Health Living arrangements for the past 2 months: Single Family Home                   DME Agency: NA   Prior Living Arrangements/Services Living arrangements for the past 2 months: Single Family Home Lives with:: Adult Children Patient language and need for interpreter reviewed:: Yes Do you feel safe going back to the place where you live?: Yes      Need for Family Participation in Patient Care: Yes (Comment) Care giver support system in place?: Yes (comment) Current home services: DME (rolling walker) Criminal Activity/Legal Involvement Pertinent to Current Situation/Hospitalization: No - Comment as needed  Activities of Daily Living   ADL Screening (condition at time of admission) Is the patient deaf or have difficulty hearing?: No Does the patient have difficulty seeing, even when wearing glasses/contacts?: No Does the patient have difficulty concentrating, remembering, or making decisions?: No  Permission  Sought/Granted Permission sought to share information with : Case Manager, Family Supports Permission granted to share information with : Yes, Verbal Permission Granted    Emotional Assessment Appearance:: Appears stated age Attitude/Demeanor/Rapport: Engaged Affect (typically observed): Appropriate   Alcohol / Substance Use: Not Applicable Psych Involvement: No (comment)  Admission diagnosis:  S/P mitral valve replacement [Z95.2] Patient Active Problem List   Diagnosis Date Noted   S/P mitral valve replacement 11/29/2023   Peripheral polyneuropathy 10/12/2023   Physical deconditioning 09/18/2023   AKI (acute kidney injury) (HCC) 09/08/2023   Renal insufficiency 09/06/2023   Supratherapeutic INR 09/06/2023   Dyspnea on exertion 09/05/2023   Precordial chest pain 09/05/2023   Respiratory distress 09/04/2023   Cough 08/30/2023   Seizure-like activity (HCC) 08/10/2022   Pulmonary nodule 02/10/2022   Long term (current) use of anticoagulants 12/17/2021   Thrombocytopenia (HCC) 12/10/2021   Rheumatic mitral stenosis 12/10/2021   Left hand weakness 12/09/2021   Diabetes mellitus (HCC) 12/09/2021   Hypothyroidism 12/01/2021   Permanent atrial fibrillation (HCC) 12/01/2021   Hx-TIA (transient ischemic attack) 12/01/2021   Dyslipidemia 12/01/2021   PCP:  de Cuba, Raymond J, MD Pharmacy:   [facility] - [place], [place] - [address] [place] [address] Phone: [phone] Fax: [phone]  [facility] [address] [place] [address] Phone: [phone] Fax: [phone]  [facility] [address] [place] [address] Phone: [phone] Fax: [phone]     Social Drivers  of Health (SDOH) Social History: SDOH Screenings   Food Insecurity: Patient Declined (10/12/2023)  Recent Concern: Food Insecurity - Food Insecurity Present  (08/30/2023)  Housing: Unknown (10/12/2023)  Recent Concern: Housing - Medium Risk (09/14/2023)  Transportation Needs: Patient Declined (10/12/2023)  Recent Concern: Transportation Needs - Unmet Transportation Needs (08/30/2023)  Utilities: Not At Risk (09/14/2023)  Alcohol Screen: Medium Risk (10/12/2023)  Depression (PHQ2-9): Low Risk  (11/06/2023)  Recent Concern: Depression (PHQ2-9) - Medium Risk (10/12/2023)  Financial Resource Strain: Patient Declined (10/12/2023)  Recent Concern: Financial Resource Strain - High Risk (08/30/2023)  Physical Activity: Inactive (08/30/2023)  Social Connections: Socially Isolated (08/30/2023)  Stress: Stress Concern Present (08/30/2023)  Tobacco Use: Low Risk  (11/29/2023)   Readmission Risk Interventions    09/13/2023    9:20 AM  Readmission Risk Prevention Plan  Transportation Screening Complete  PCP or Specialist Appt within 5-7 Days Complete  Home Care Screening Complete  Medication Review (RN CM) Complete

## 2023-11-30 NOTE — Discharge Summary (Signed)
 [address]       [place] [address]             [phone]    Physician Discharge Summary  Patient ID: Christy Poole MRN: [id number] DOB/AGE: 07-06-1961 63 y.o.  Admit date: 11/29/2023 Discharge date: 12/07/2023  Admission Diagnoses:  Patient Active Problem List   Diagnosis Date Noted   S/P mitral valve replacement 11/29/2023   Peripheral polyneuropathy 10/12/2023   Physical deconditioning 09/18/2023   AKI (acute kidney injury) (HCC) 09/08/2023   Renal insufficiency 09/06/2023   Supratherapeutic INR 09/06/2023   Dyspnea on exertion 09/05/2023   Precordial chest pain 09/05/2023   Respiratory distress 09/04/2023   Cough 08/30/2023   Seizure-like activity (HCC) 08/10/2022   Pulmonary nodule 02/10/2022   Long term (current) use of anticoagulants 12/17/2021   Thrombocytopenia (HCC) 12/10/2021   Rheumatic mitral stenosis 12/10/2021   Left hand weakness 12/09/2021   Diabetes mellitus (HCC) 12/09/2021   Hypothyroidism 12/01/2021   Permanent atrial fibrillation (HCC) 12/01/2021   Hx-TIA (transient ischemic attack) 12/01/2021   Dyslipidemia 12/01/2021     Discharge Diagnoses:  Patient Active Problem List   Diagnosis Date Noted   S/P mitral valve replacement 11/29/2023   Peripheral polyneuropathy 10/12/2023   Physical deconditioning 09/18/2023   AKI (acute kidney injury) (HCC) 09/08/2023   Renal insufficiency 09/06/2023   Supratherapeutic INR 09/06/2023   Dyspnea on exertion 09/05/2023   Precordial chest pain 09/05/2023   Respiratory distress 09/04/2023   Cough 08/30/2023   Seizure-like activity (HCC) 08/10/2022   Pulmonary nodule 02/10/2022   Long term (current) use of anticoagulants 12/17/2021   Thrombocytopenia (HCC) 12/10/2021   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 Will need appt. for Coumadin  Clinic)  History of Present Illness:     Pt is a 63 yo woman with recent admission last November for severe rhuematic MS. Pt was admitted with bronchopneumonia and on work up was found to have MS with a mean gradient of 11 mmHG and normal LV function. No significant MR. Pt with long history of afib on coumadin  and had previous LAD stenting and MV balloon valvuloplasty in [place] in 2018. Pt has suffered a stroke in the past. She currently has DOE with walking stairs and also occasional rest CP. She had cath without CAD and moderate PHTN. Pt was felt best to be served with MV replacement and has been sent for consideration. Her son was serving as interpreter and called his uncle in [place] who is an intensivist. We discussed her issues over the phone and he then discussed this with her son and herself. PT is very nervous about surgery and became tearful.  63 yo female with 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 Pt with long standing afib and on coumadin  would best be served at her age 63 with MV mechanical replacement and consideration of TV ring annuloplasty due to moderate TR and PHTN. She will need to be bridged with lovenox  for 5 days prior to surgery on 2/5. We discussed all the risks and goals and recovery from surgery  and it appears they understand and wish to proceed.   Hospital Course: Ms. Delude was admitted for elective surgery on 11/29/23 and taken to the OR where the rheumatic mitral valve was replaced with with a 27mm [employer] mechanical valve.  Following the procedure she was separated from cardiopulmonary bypass without difficulty and was transferred to the ICU in stable condition.    Postoperative hospital  course:  She was extubated using standard post cardiac surgical protocols without difficulty.  She has maintained stable hemodynamics initially requiring nor epi and milrinone  but these were weaned off over time without difficulty.  She had moderate chest tube drainage and chest tubes were left in place on postop day #1.  She does have an expected acute blood loss anemia and additionally has received PRBCs.  She has volume overload related to surgery and not clinically significant responding appropriately to routine diuresis.  On postop day 2 her creatinine bumped to 1.27 diuresis was stopped at that time with ongoing plans to follow and potentially reinitiate at needed Coumadin  was initiated on postop day #2.  Atrial fibrillation was rate controlled initially and amiodarone  was continued.  She then developed some bradycardia into the 30s to 50s.  Pacer wires have been kept in place. Lasix  was increased and Metolazone  was added for further diuresis on 02/09. Foley was removed on 02/10.  She had an excellent response to this.  Lasix  was discontinued on postop day 6 as she appeared euvolemic.  Renal function is now in the normal range.  Thrombocytopenia has resolved.  Blood sugars have been under good control using standard sliding scale with plans to resume Glucophage  at time of discharge.  She has been evaluated by both physical and Occupational Therapy and home health is being arranged as it is DME equipment.  Oxygen has been weaned and she maintains good saturations on room air.  Clinical bronchitis has shown a steady improvement over time and lung fields are currently clear.  Most recent INR on 12/07/2023 is 2.4 at discharge she will be placed on previous dosing with plans to get INR next week at the Coumadin  clinic.  Incisions healing well without evidence of infection.  She is tolerating diet.  Overall, at the time of discharge she is felt to be stable.    Consults: pulmonary/intensive  care  Significant Diagnostic Studies:  ECHO INTRAOPERATIVE TEE Result Date: 12/05/2023  *INTRAOPERATIVE TRANSESOPHAGEAL REPORT *  [name] Date of Exam: 11/29/2023 Medical Rec #:  [id number]       Height:       59.0 in Accession #:    [id number]      Weight:       105.8 lb Date of Birth:  02-15-1961       BSA:          1.41 m Patient Age:    62 years        BP:           149/94 mmHg Patient Gender: F               HR:           77 bpm. Exam Location:  Anesthesiology Transesophogeal exam was perform intraoperatively during surgical procedure. Patient was closely monitored under general anesthesia during the entirety of examination. Indications:     I05.0 Rheumatic mitral stenosis; I36.1 Nonrheumatic tricuspid                  (  valve) insufficiency; I48.91* Unspecified atrial fibrillation Sonographer:     Damien Senior RDCS Performing Phys: [id number] [name] Diagnosing Phys: Lonni Custard MD Complications: No known complications during this procedure. POST-OP IMPRESSIONS _ Left Ventricle: has normal systolic function, with an ejection fraction of 55%. The wall motion is normal. _ Right Ventricle: mildly reduced function. The wall motion is normal. _ Aorta: there is no dissection present in the aorta. _ Aortic Valve: There is no regurgitation. _ Mitral Valve: A bileaflet mechanical device mechanical valve was placed, leaflets are freely mobile. Normal washing jets for valve type. The gradient recorded across the prosthetic valve is within the expected range, measuring 4 mmHg. No perivalvular leak noted. _ Tricuspid Valve: There is moderate regurgitation. _ Comments: LAA ligated and no longer visible. PRE-OP FINDINGS  Left Ventricle: The left ventricle has normal systolic function, with an ejection fraction of 55-60%. The cavity size was normal. No evidence of left ventricular regional wall motion abnormalities. There is moderate concentric left ventricular hypertrophy. Right Ventricle: The  right ventricle has mildly reduced systolic function. The cavity was normal. There is right vetricular wall thickness was not assessed. Left Atrium: Left atrial size was not assessed. No left atrial/left atrial appendage thrombus was detected. There is echo contrast seen in the left atrial cavity and left atrial appendage. The left atrial appendage is well visualized and there is no evidence of thrombus present. Right Atrium: Right atrial size was not assessed. Pericardium: There is no evidence of pericardial effusion. Mitral Valve: The mitral valve is rheumatic. Mitral valve regurgitation is trivial by color flow Doppler. There is Moderate mitral stenosis. There is moderate thickening and moderate calcification present on the mitral valve posterior cusp with severely decreased mobility and there is moderate thickening and mild calcification present on the mitral valve anterior cusp with moderately decreased mobility. Tricuspid Valve: The tricuspid valve was normal in structure. Tricuspid valve regurgitation moderate to severe. The jet is directed centrally. No evidence of tricuspid stenosis is present. Aortic Valve: The aortic valve is tricuspid Aortic valve regurgitation is trivial by color flow Doppler. Pulmonic Valve: The pulmonic valve was normal in structure, with normal. No evidence of pumonic stenosis. Pulmonic valve regurgitation is mild by color flow Doppler. Aorta: There is evidence of a dissection in the none. +--------------+--------++ LEFT VENTRICLE         +--------------+--------++ PLAX 2D                +--------------+--------++ LVOT diam:    1.60 cm  +--------------+--------++ LVOT Area:    2.01 cm +--------------+--------++                        +--------------+--------++ +------------------+-----------++ AORTIC VALVE                  +------------------+-----------++ AV Area (Vmax):   1.95 cm    +------------------+-----------++ AV Area (Vmean):  1.87 cm     +------------------+-----------++ AV Area (VTI):    1.68 cm    +------------------+-----------++ AV Vmax:          98.60 cm/s  +------------------+-----------++ AV Vmean:         70.400 cm/s +------------------+-----------++ AV VTI:           0.251 m     +------------------+-----------++ AV Peak Grad:     3.9 mmHg    +------------------+-----------++ AV Mean Grad:     2.0 mmHg    +------------------+-----------++ LVOT Vmax:  95.50 cm/s  +------------------+-----------++ LVOT Vmean:       65.600 cm/s +------------------+-----------++ LVOT VTI:         0.210 m     +------------------+-----------++ LVOT/AV VTI ratio:0.84        +------------------+-----------++  +--------------+-------++ AORTA                 +--------------+-------++ Ao Sinus diam:2.40 cm +--------------+-------++ Ao STJ diam:  1.9 cm  +--------------+-------++ Ao Asc diam:  2.60 cm +--------------+-------++ +-------------+----------++ +---------------+-----------++ MITRAL VALVE            TRICUSPID VALVE            +-------------+----------++ +---------------+-----------++ MV Peak grad:11.8 mmHg  TV Peak grad:  0.7 mmHg    +-------------+----------++ +---------------+-----------++ MV Mean grad:5.5 mmHg   TV Mean grad:  0.0 mmHg    +-------------+----------++ +---------------+-----------++ MV Vmax:     1.72 m/s   TV Vmax:       0.41 m/s    +-------------+----------++ +---------------+-----------++ MV Vmean:    107.5 cm/s TV Vmean:      27.0 cm/s   +-------------+----------++ +---------------+-----------++ MV VTI:      0.38 m     TV VTI:        0.11 msec   +-------------+----------++ +---------------+-----------++                             TR Peak grad:  37.9 mmHg                               +---------------+-----------++                             TR Mean grad:  26.0 mmHg                                +---------------+-----------++                             TR Vmax:       308.00 cm/s                             +---------------+-----------++                             TR Vmean:      240.0 cm/s                              +---------------+-----------++                              +--------------+-------+                             SHUNTS                                            +--------------+-------+  Systemic VTI: 0.21 m                              +--------------+-------+                             Systemic Diam:1.60 cm                             +--------------+-------+  Lonni Custard MD Electronically signed by Lonni Custard MD Signature Date/Time: 12/05/2023/1:36:00 PM    Final    DG CHEST PORT 1 VIEW Result Date: 12/04/2023 CLINICAL DATA:  Postop mitral valve replacement. EXAM: PORTABLE CHEST 1 VIEW COMPARISON:  Radiographs 12/03/2023 and 12/01/2023.  CT 09/09/2023. FINDINGS: 0512 hours. Right IJ sheath has been removed in the interval. The heart size and mediastinal contours are stable status post median sternotomy and mitral valve replacement. No significant change in bilateral pleural effusions, bibasilar atelectasis and mild pulmonary edema. No evidence of pneumothorax. The bones appear unchanged. IMPRESSION: Interval removal of right IJ sheath. No other significant change. Electronically Signed   By: Elsie Perone M.D.   On: 12/04/2023 10:03   DG CHEST PORT 1 VIEW Result Date: 12/03/2023 CLINICAL DATA:  Status post mitral valve replacement. EXAM: PORTABLE CHEST 1 VIEW COMPARISON:  12/01/2023 FINDINGS: The cardio pericardial silhouette is enlarged. Bibasilar atelectasis with small bilateral pleural effusions. Mediastinal/pericardial drains have been removed in the interval. The right IJ sheath remains in place. Telemetry leads overlie the chest. IMPRESSION: Bibasilar atelectasis with small bilateral pleural effusions. Electronically  Signed   By: Camellia Candle M.D.   On: 12/03/2023 09:54   DG Chest Port 1 View Result Date: 12/01/2023 CLINICAL DATA:  Status post mitral valve repair EXAM: PORTABLE CHEST 1 VIEW COMPARISON:  11/30/2023 FINDINGS: Cardiac shadow is stable. Postsurgical changes are again seen. Mediastinal drain and pericardial drain are again noted and stable. Right jugular sheath is again seen although the Swan-Ganz catheter has been removed. Basilar atelectasis on the right is noted with small effusion. The previously seen left perihilar opacity has improved in the interval from the prior exam. No pneumothorax is seen. IMPRESSION: Improved aeration in the left perihilar region. Persistent small effusion and atelectatic changes on the right. Electronically Signed   By: Oneil Devonshire M.D.   On: 12/01/2023 10:53   DG Chest Port 1 View Result Date: 11/30/2023 CLINICAL DATA:  Mitral valve replacement. EXAM: PORTABLE CHEST 1 VIEW COMPARISON:  11/28/2018 FINDINGS: Interval removal of endotracheal and NG tubes. Right IJ pulmonary artery catheter remains in place with tip position in the region of the pulmonary outflow tract. 2 mediastinal/pericardial drains noted in the midline. No evidence for pneumothorax. Mild vascular congestion with dependent atelectasis in both lungs and small bilateral pleural effusions. New left parahilar density is presumably atelectatic. Telemetry leads overlie the chest. IMPRESSION: 1. Interval removal of endotracheal and NG tubes. 2. New left parahilar opacity is presumably atelectatic. Attention on follow-up recommended. 3. Mild vascular congestion with dependent atelectasis in both lungs and small bilateral pleural effusions. Electronically Signed   By: Camellia Candle M.D.   On: 11/30/2023 10:06   DG Chest Port 1 View Addendum Date: 11/29/2023 ADDENDUM REPORT: 11/29/2023 13:25 ADDENDUM: Heart size is upper limits of normal. Postoperative changes compatible with mitral valve surgery. Electronically Signed    By: Juliene Balder M.D.   On:  11/29/2023 13:25   Result Date: 11/29/2023 CLINICAL DATA:  Status post mitral valve replacement. EXAM: PORTABLE CHEST 1 VIEW COMPARISON:  11/27/2023 FINDINGS: Endotracheal tube tip is in the proximal right mainstem bronchus just below the carina. Nasogastric tube extends into the abdomen but the tip is beyond the image. Right jugular central line with pulmonary arterial catheter. Pulmonary arterial catheter is in the distal main pulmonary artery region. Evidence for 2 chest tubes. Negative for pneumothorax. Prominent lung markings are similar to the preoperative images. Cannot exclude a small amount atelectasis in the retrocardiac region. IMPRESSION: 1. Endotracheal tube tip in the proximal right mainstem bronchus. Recommend repositioning. 2. Other support apparatuses as described. 3. Possible small amount of atelectasis in the retrocardiac region. These results will be called to the ordering clinician or representative by the Radiologist Assistant, and communication documented in the PACS or [employer]. Electronically Signed: By: Juliene Balder M.D. On: 11/29/2023 13:22   DG Chest 2 View Result Date: 11/29/2023 CLINICAL DATA:  Preoperative chest examination. Rheumatic mitral stenosis. EXAM: CHEST - 2 VIEW COMPARISON:  09/08/2023 FINDINGS: Slightly prominent or coarse lung markings appear to be chronic. No overt pulmonary edema. Heart size is slightly enlarged. Trachea is midline. Atherosclerotic calcifications at the aortic arch. No pleural effusions. IMPRESSION: 1. No acute cardiopulmonary disease. 2. Cardiomegaly. Electronically Signed   By: Juliene Balder M.D.   On: 11/29/2023 13:25    Results for orders placed or performed during the hospital encounter of 11/29/23 (from the past 48 hours)  Glucose, capillary     Status: Abnormal   Collection Time: 12/05/23 11:45 AM  Result Value Ref Range   Glucose-Capillary 172 (H) 70 - 99 mg/dL    Comment: Glucose reference range applies only  to samples taken after fasting for at least 8 hours.  Glucose, capillary     Status: None   Collection Time: 12/05/23  4:52 PM  Result Value Ref Range   Glucose-Capillary 92 70 - 99 mg/dL    Comment: Glucose reference range applies only to samples taken after fasting for at least 8 hours.  Glucose, capillary     Status: Abnormal   Collection Time: 12/05/23  9:23 PM  Result Value Ref Range   Glucose-Capillary 138 (H) 70 - 99 mg/dL    Comment: Glucose reference range applies only to samples taken after fasting for at least 8 hours.  Protime-INR     Status: Abnormal   Collection Time: 12/06/23  3:29 AM  Result Value Ref Range   Prothrombin Time 22.1 (H) 11.4 - 15.2 seconds   INR 1.9 (H) 0.8 - 1.2    Comment: (NOTE) INR goal varies based on device and disease states. Performed at [facility], [address] [place], [place] [address]   Basic metabolic panel     Status: Abnormal   Collection Time: 12/06/23  3:29 AM  Result Value Ref Range   Sodium 132 (L) 135 - 145 mmol/L   Potassium 4.0 3.5 - 5.1 mmol/L   Chloride 89 (L) 98 - 111 mmol/L   CO2 28 22 - 32 mmol/L   Glucose, Bld 102 (H) 70 - 99 mg/dL    Comment: Glucose reference range applies only to samples taken after fasting for at least 8 hours.   BUN 13 8 - 23 mg/dL   Creatinine, Ser 9.28 0.44 - 1.00 mg/dL   Calcium  9.3 8.9 - 10.3 mg/dL   GFR, Estimated >39 >39 mL/min    Comment: (NOTE) Calculated  using the CKD-EPI Creatinine Equation (2021)    Anion gap 15 5 - 15    Comment: Performed at [facility], [address] [place], [place] [address]  CBC     Status: Abnormal   Collection Time: 12/06/23  3:29 AM  Result Value Ref Range   WBC 11.9 (H) 4.0 - 10.5 K/uL   RBC 3.31 (L) 3.87 - 5.11 MIL/uL   Hemoglobin 9.9 (L) 12.0 - 15.0 g/dL   HCT 70.5 (L) 63.9 - 53.9 %   MCV 88.8 80.0 - 100.0 fL   MCH 29.9 26.0 - 34.0 pg   MCHC 33.7 30.0 - 36.0 g/dL   RDW 84.3 (H) 88.4 - 84.4 %   Platelets 200 150 - 400 K/uL    nRBC 0.2 0.0 - 0.2 %    Comment: Performed at [facility], [address], [place], [place] [address]  Glucose, capillary     Status: None   Collection Time: 12/06/23  6:17 AM  Result Value Ref Range   Glucose-Capillary 85 70 - 99 mg/dL    Comment: Glucose reference range applies only to samples taken after fasting for at least 8 hours.  Glucose, capillary     Status: Abnormal   Collection Time: 12/06/23 11:37 AM  Result Value Ref Range   Glucose-Capillary 175 (H) 70 - 99 mg/dL    Comment: Glucose reference range applies only to samples taken after fasting for at least 8 hours.  Glucose, capillary     Status: Abnormal   Collection Time: 12/06/23  4:28 PM  Result Value Ref Range   Glucose-Capillary 109 (H) 70 - 99 mg/dL    Comment: Glucose reference range applies only to samples taken after fasting for at least 8 hours.  Glucose, capillary     Status: Abnormal   Collection Time: 12/06/23  9:18 PM  Result Value Ref Range   Glucose-Capillary 174 (H) 70 - 99 mg/dL    Comment: Glucose reference range applies only to samples taken after fasting for at least 8 hours.   Comment 1 Notify RN    Comment 2 Document in Chart   Protime-INR     Status: Abnormal   Collection Time: 12/07/23  3:40 AM  Result Value Ref Range   Prothrombin Time 26.5 (H) 11.4 - 15.2 seconds   INR 2.4 (H) 0.8 - 1.2    Comment: (NOTE) INR goal varies based on device and disease states. Performed at [facility], [address], [place], [place] [address]   Glucose, capillary     Status: None   Collection Time: 12/07/23  6:25 AM  Result Value Ref Range   Glucose-Capillary 94 70 - 99 mg/dL    Comment: Glucose reference range applies only to samples taken after fasting for at least 8 hours.    Treatments: surgery: surgery CARDIOVASCULAR SURGERY OPERATIVE NOTE   11/29/2023 Christy Poole [id number]   Surgeon:  Deward LELON Kallman, MD   First Assistant: Laurel Becket [facility]                                An experienced assistant was required given the complexity of this surgery and the standard of surgical care. The assistant was needed for exposure, dissection, suctioning, retraction of delicate tissues and sutures, instrument exchange and for overall help during this procedure.       Preoperative Diagnosis: Rheurmatic Mitral Stenosis  Postoperative Diagnosis:  Same     Procedure: Mitral Valve Replacement with a 27 mm St Jude Mechanical Prosthesis Closure of LAA   Anesthesia:  General Endotracheal     Clinical History/Surgical Indication:  Discharge Exam: Blood pressure 102/62, pulse 82, temperature 98.3 F (36.8 C), temperature source Oral, resp. rate (!) 21, height 4' 11 (1.499 m), weight 47.7 kg, SpO2 (!) 74%.  General appearance: alert, cooperative, and no distress Heart: irregularly irregular rhythm Lungs: clear to auscultation bilaterally Abdomen: benign Extremities: no edema Wound: incis healing well  Discharge Medications:  The patient has been discharged on:   1.Beta Blocker:  Yes [ y  ]                              No   [   ]                              If No, reason:  2.Ace Inhibitor/ARB: Yes [   ]                                     No  [  n  ]                                     If No, reason:BP well controlled  3.Statin:   Yes [ y  ]                  No  [   ]                  If No, reason:  4.Ecasa:  Yes  [  y ]                  No   [   ]                  If No, reason:  Patient had ACS upon admission:n  Plavix/P2Y12 inhibitor: Yes [   ]                                      No  [ n  ]     Discharge Instructions     Amb Referral to Cardiac Rehabilitation   Complete by: As directed    Diagnosis: Valve Replacement   Valve: Mitral   After initial evaluation and assessments completed: Virtual Based Care may be provided alone or in conjunction with Phase 2 Cardiac Rehab based on patient barriers.: Yes   Intensive Cardiac Rehabilitation  (ICR) [facility] location only OR Traditional Cardiac Rehabilitation (TCR) *If criteria for ICR are not met will enroll in TCR [facility] only): Yes   Discharge patient   Complete by: As directed    Discharge disposition: 01-Home or Self Care   Discharge patient date: 12/07/2023      Allergies as of 12/07/2023       Reactions   Other Other (See Comments)   Patient is vegetarian no meat can eat eggs        Medication List     STOP taking these medications  enoxaparin  40 MG/0.4ML injection Commonly known as: LOVENOX    furosemide  40 MG tablet Commonly known as: LASIX    metoprolol  succinate 50 MG 24 hr tablet Commonly known as: TOPROL -XL   spironolactone  25 MG tablet Commonly known as: ALDACTONE        TAKE these medications    acetaminophen  500 MG tablet Commonly known as: TYLENOL  Take 500 mg by mouth every 6 (six) hours as needed for mild pain (pain score 1-3), headache or fever.   albuterol  108 (90 Base) MCG/ACT inhaler Commonly known as: VENTOLIN  HFA Inhale 1-2 puffs into the lungs every 4 (four) hours as needed for wheezing or shortness of breath.   Aspirin  Low Dose 81 MG tablet Generic drug: aspirin  EC TAKE 1 TABLET (81 MG TOTAL) BY MOUTH DAILY. (SWALLOW WHOLE)   atorvastatin  80 MG tablet Commonly known as: LIPITOR  TAKE 1 TABLET (80 MG TOTAL) BY MOUTH DAILY.   cetaphil lotion Apply 1 Application topically daily as needed for dry skin.   gabapentin  100 MG capsule Commonly known as: Neurontin  Take 1 capsule (100 mg total) by mouth at bedtime.   levETIRAcetam  250 MG tablet Commonly known as: KEPPRA  Take 1 tablet (250 mg total) by mouth 2 (two) times daily.   levothyroxine  50 MCG tablet Commonly known as: SYNTHROID  TAKE 1 TABLET (50 MCG TOTAL) BY MOUTH AT BEDTIME.   metFORMIN  500 MG 24 hr tablet Commonly known as: GLUCOPHAGE -XR TAKE 1 TABLET (500 MG TOTAL) BY MOUTH DAILY WITH BREAKFAST.   mometasone -formoterol  100-5 MCG/ACT Aero Commonly known as:  DULERA  Inhale 2 puffs into the lungs 2 (two) times daily as needed for wheezing or shortness of breath.   oxyCODONE  5 MG immediate release tablet Commonly known as: Oxy IR/ROXICODONE  Take 1 tablet (5 mg total) by mouth every 6 (six) hours as needed for up to 7 days for severe pain (pain score 7-10).   warfarin 2.5 MG tablet Commonly known as: COUMADIN  Take as directed. If you are unsure how to take this medication, talk to your nurse or doctor. Original instructions: Take 1-1.5 tablets (2.5-3.75 mg total) by mouth See admin instructions. Take 3.75 mg on Thurs, Fri, Sat, Mon, Tues, Wed. Take 2.5 mg on Sun.               Durable Medical Equipment  (From admission, onward)           Start     Ordered   12/06/23 1155  For home use only DME Walker rolling  Once       Comments: S/p MVR  Question Answer Comment  Walker: With 5 Inch Wheels   Patient needs a walker to treat with the following condition Physical deconditioning      12/06/23 1154   12/06/23 1136  For home use only DME Tub bench  Once        12/06/23 1136   12/05/23 0915  For home use only DME 3 n 1  Once        12/05/23 0914   12/05/23 0915  For home use only DME Bedside commode  Once       Question:  Patient needs a bedside commode to treat with the following condition  Answer:  Physical deconditioning   12/05/23 0914            Follow-up Information     [facility] Follow up.   Why: HHPT/OT arranged- they will contact you to schedule Contact information: [address], [place], [place] [address]  Phone: [phone]        [facility] Follow up.   Why: (Adapt)- rolling walker, bedside commode, tub bench arranged- to be delivered to room prior to discharge Contact information: [address] [place] [address] [phone]                 Signed:  Lemond FORBES Cera, PA-C  12/07/2023, 10:03 AM

## 2023-11-30 NOTE — Progress Notes (Signed)
 [address]       Christy Poole [address]             [phone]      1 Day Post-Op  Procedure(s) (LRB): MITRAL VALVE (MV) REPLACEMENT USING ST JUDE MASTERS MECHANICAL VALVE (N/A) TRANSESOPHAGEAL ECHOCARDIOGRAM (TEE) (N/A)   Total Length of Stay:  LOS: 1 day    SUBJECTIVE: Had uneventful night. Now off norepi  Vitals:   11/30/23 0629 11/30/23 0700  BP:    Pulse: 75 77  Resp: 15 (!) 25  Temp: 99 F (37.2 C) 99 F (37.2 C)  SpO2: 100% 100%    Intake/Output      02/05 0701 02/06 0700 02/06 0701 02/07 0700   I.V. (mL/kg) 1841.9 (37.7)    Blood 610    IV Piggyback 2019.3    Total Intake(mL/kg) 4471.1 (91.4)    Urine (mL/kg/hr) 2000 (1.7)    Blood 485    Chest Tube 720    Total Output 3205    Net +1266.1             sodium chloride  10 mL/hr at 11/30/23 0600   albumin  human 999 mL/hr at 11/30/23 0600    ceFAZolin  (ANCEF ) IV 200 mL/hr at 11/30/23 0600   dexmedetomidine  (PRECEDEX ) IV infusion Stopped (11/29/23 1500)   insulin  0.6 Units/hr (11/30/23 0600)   milrinone  0.375 mcg/kg/min (11/30/23 0600)   nitroGLYCERIN      norepinephrine  (LEVOPHED ) Adult infusion Stopped (11/30/23 0245)    CBC    Component Value Date/Time   WBC 11.4 (H) 11/30/2023 0259   RBC 2.89 (L) 11/30/2023 0259   HGB 8.2 (L) 11/30/2023 0259   HGB 13.7 09/29/2023 1110   HCT 25.5 (L) 11/30/2023 0259   HCT 43.8 09/29/2023 1110   PLT 60 (L) 11/30/2023 0259   PLT 141 (L) 09/29/2023 1110   MCV 88.2 11/30/2023 0259   MCV 88 09/29/2023 1110   MCH 28.4 11/30/2023 0259   MCHC 32.2 11/30/2023 0259   RDW 14.0 11/30/2023 0259   RDW 13.0 09/29/2023 1110   LYMPHSABS 1.5 11/10/2022 0912   MONOABS 0.4 12/15/2021 0749   EOSABS 0.2 11/10/2022 0912   BASOSABS 0.1 11/10/2022 0912   CMP     Component Value Date/Time   NA 137 11/30/2023 0259   NA 140 09/29/2023 1110   K 4.1 11/30/2023 0259   CL 107 11/30/2023 0259   CO2 22 11/30/2023 0259   GLUCOSE 122 (H) 11/30/2023  0259   BUN 9 11/30/2023 0259   BUN 14 09/29/2023 1110   CREATININE 0.77 11/30/2023 0259   CALCIUM  8.2 (L) 11/30/2023 0259   PROT 7.1 11/27/2023 1155   PROT 7.8 07/12/2023 1327   ALBUMIN  3.9 11/27/2023 1155   ALBUMIN  4.2 02/09/2023 1417   AST 36 11/27/2023 1155   ALT 30 11/27/2023 1155   ALKPHOS 64 11/27/2023 1155   BILITOT 1.3 (H) 11/27/2023 1155   BILITOT 0.7 02/09/2023 1417   GFRNONAA >60 11/30/2023 0259   ABG    Component Value Date/Time   PHART 7.346 (L) 11/29/2023 1756   PCO2ART 42.4 11/29/2023 1756   PO2ART 74 (L) 11/29/2023 1756   HCO3 23.2 11/29/2023 1756   TCO2 25 11/29/2023 1756   ACIDBASEDEF 2.0 11/29/2023 1756   O2SAT 94 11/29/2023 1756   CBG (last 3)  Recent Labs    11/30/23 0056 11/30/23 0307 11/30/23 0519  GLUCAP 120* 120* 120*  EXAM Lungs: clear Card: RR with sharp valve  click Ext: Warm Neuro: intact   ASSESSMENT: PPOD #1 SP MVR (mechanical) Hemodynamics good with excellent CI and on milrinone . Will stop that this am and if ok CI at 2 hrs off will dc art line and swan.  Pulm: on [place] will give lasix  and continue inhalers Thrombocytopenia: will follow for now. Most likely secondary to bypass circuit OOB to chair Leave Chest tubes Start heparin  tomorrow if plts improve   Christy Kallman, MD 11/30/2023

## 2023-11-30 NOTE — Progress Notes (Signed)
 NAME:  Christy Poole, MRN:  [id number], DOB:  Dec 10, 1960, LOS: 1 ADMISSION DATE:  11/29/2023, CONSULTATION DATE: 11/28/2022 REFERRING MD: [name], CHIEF COMPLAINT: Status post mitral valve replacement  History of Present Illness:  63 year old female with chronic A-fib, diabetes type 2, coronary artery disease status post stent and rheumatic mitral valve stenosis who started with dyspnea on exertion, today she underwent mechanical mitral valve replacement and closure of left atrial appendage, remained intubated and was transferred to ICU.  PCCM was consulted for help evaluation medical management  Pertinent  Medical History   Past Medical History:  Diagnosis Date   Atrial fibrillation (HCC)    Coronary artery disease    s/p LAD stents   Diabetes mellitus without complication (HCC)    Dyspnea    Headache    HLD (hyperlipidemia)    Hypertension    Memory loss    due to stroke in 2024   Neuromuscular disorder (HCC)    feet   Pneumonia    x 1   Seizure (HCC)    last one was in 09/2023   Stroke [facility]) 2019   Thyroid disease    Walker as ambulation aid      Significant Hospital Events: Including procedures, antibiotic start and stop dates in addition to other pertinent events     Interim History / Subjective:  Patient was successfully extubated per rapid weaning protocol Milrinone  was stopped this morning Stated pain is controlled   Objective   Blood pressure (!) 104/49, pulse 74, temperature 98.6 F (37 C), resp. rate (!) 21, height 4' 11 (1.499 m), weight 48.9 kg, SpO2 100%. PAP: (33-64)/(8-34) 54/34 CO:  [1.7 L/min-4.5 L/min] 4.5 L/min CI:  [1.21 L/min/m2-3.2 L/min/m2] 3.2 L/min/m2  Vent Mode: CPAP;PSV FiO2 (%):  [40 %-50 %] 40 % Set Rate:  [4 bmp-20 bmp] 4 bmp Vt Set:  [340 mL] 340 mL PEEP:  [5 cmH20] 5 cmH20 Pressure Support:  [10 cmH20] 10 cmH20 Plateau Pressure:  [20 cmH20] 20 cmH20   Intake/Output Summary (Last 24 hours) at 11/30/2023 9094 Last data filed  at 11/30/2023 0800 Gross per 24 hour  Intake 4325.83 ml  Output 3275 ml  Net 1050.83 ml   Filed Weights   11/29/23 0710 11/30/23 0500  Weight: 48 kg 48.9 kg    Examination: General: Elderly female, lying on the bed HEENT: [place]/AT, eyes anicteric.  moist mucus membranes Neuro: Alert, awake following commands, moving all 4 extremities Chest: Central sternotomy and dry, coarse breath sounds, no wheezes or rhonchi.  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 Chronic A-fib status post left atrial appendage closure Coronary artery disease Continue aspirin  and statin Started on amiodarone  Continue as needed metoprolol  Chest tube management TCTS Chest tube output is 720 cc Continue pain control with tramadol , oxycodone  and morphine  Closely monitor chest tube output Was taken off milrinone  this morning Monitor cardiac output and cardiac index  Acute respiratory insufficiency, postop Patient was successfully extubated per rapid weaning protocol Currently on 3 L nasal cannula oxygen Encourage incentive spirometry  Hyperlipidemia Continue atorvastatin   Diabetes type 2 Patient hemoglobin A1c is 5.8 Insulin  fusion was titrated off,  currently on sliding scale insulin   Expected perioperative blood loss anemia 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 Monitor H/H and PLT counts Received 1 unit PRBC yesterday for hemoglobin of 6.5 Platelet count is at 60, closely monitor Watch for signs of bleeding   Best Practice (right click and Reselect all SmartList Selections daily)   Diet/type: Regular consistency DVT prophylaxis: SCD,  due to low platelet count GI prophylaxis: PPI Lines: Central line, Arterial Line, and yes and it is still needed Foley:  Yes, and it is still needed Code Status:  full code Last date of multidisciplinary goals of care discussion [Per primary team]   Labs   CBC: Recent Labs  Lab 11/27/23 1155 11/29/23 0918 11/29/23 1032 11/29/23 1033 11/29/23 1207 11/29/23 1646 11/29/23 1755 11/29/23 1756 11/29/23 2057 11/30/23 0259  WBC 5.4  --   --   --  12.3*  --  9.5  --   --  11.4*  HGB 12.9   < > 7.0*   < > 8.2*  8.0* 6.8* 7.1* 6.5* 8.8* 8.2*  HCT 40.1   < > 21.2*   < > 24.0*  25.1* 20.0* 22.1* 19.0* 27.3* 25.5*  MCV 89.1  --   --   --  89.3  --  89.5  --   --  88.2  PLT 166  --  73*  --  85*  --  61*  --   --  60*   < > = values in this interval not displayed.    Basic Metabolic Panel: Recent Labs  Lab 11/27/23 1155 11/29/23 0918 11/29/23 0941 11/29/23 0956 11/29/23 1033 11/29/23 1108 11/29/23 1111 11/29/23 1207 11/29/23 1646 11/29/23 1755 11/29/23 1756 11/30/23 0259  NA 143   < > 136   < > 139   < > 138 140 139 140 141 137  K 4.8   < > 3.5   < > 4.2   < > 4.2 3.9 4.5 4.1 4.2 4.1  CL 108   < > 103  --  103  --  102  --   --  106  --  107  CO2 25  --   --   --   --   --   --   --   --  24  --  22  GLUCOSE 103*   < > 189*  --  119*  --  117*  --   --  114*  --  122*  BUN 12   < > 11  --  10  --  10  --   --  8  --  9  CREATININE 1.03*   < > 0.60  --  0.50  --  0.60  --   --  0.71  --  0.77  CALCIUM  10.4*  --   --   --   --   --   --   --   --  8.1*  --  8.2*  MG  --   --   --   --   --   --   --   --   --  3.7*  --  2.8*   < > = values in this interval not displayed.   GFR: Estimated Creatinine Clearance: 48.5 mL/min (by C-G formula based on SCr of 0.77 mg/dL). Recent Labs  Lab 11/27/23 1155 11/29/23 1207 11/29/23 1755 11/30/23 0259  WBC 5.4 12.3* 9.5 11.4*    Liver Function Tests: Recent Labs  Lab 11/27/23  1155  AST 36  ALT 30  ALKPHOS 64  BILITOT 1.3*   PROT 7.1  ALBUMIN  3.9   No results for input(s): LIPASE, AMYLASE in the last 168 hours. No results for input(s): AMMONIA in the last 168 hours.  ABG    Component Value Date/Time   PHART 7.346 (L) 11/29/2023 1756   PCO2ART 42.4 11/29/2023 1756   PO2ART 74 (L) 11/29/2023 1756   HCO3 23.2 11/29/2023 1756   TCO2 25 11/29/2023 1756   ACIDBASEDEF 2.0 11/29/2023 1756   O2SAT 94 11/29/2023 1756     Coagulation Profile: Recent Labs  Lab 11/27/23 1155 11/29/23 0728 11/29/23 1207  INR 1.4* 1.1 1.8*    Cardiac Enzymes: No results for input(s): CKTOTAL, CKMB, CKMBINDEX, TROPONINI in the last 168 hours.  HbA1C: Hgb A1c MFr Bld  Date/Time Value Ref Range Status  11/27/2023 11:55 AM 5.8 (H) 4.8 - 5.6 % Final    Comment:    (NOTE) Pre diabetes:          5.7%-6.4%  Diabetes:              >6.4%  Glycemic control for   <7.0% adults with diabetes   09/12/2023 02:33 AM 7.0 (H) 4.8 - 5.6 % Final    Comment:    (NOTE) Pre diabetes:          5.7%-6.4%  Diabetes:              >6.4%  Glycemic control for   <7.0% adults with diabetes     CBG: Recent Labs  Lab 11/30/23 0056 11/30/23 0307 11/30/23 0519 11/30/23 0713 11/30/23 0845  GLUCAP 120* 120* 120* 109* 95     Valinda Novas, MD [facility] See Amion for pager If no response to pager, please call [phone] until 7pm After 7pm, Please call E-link [phone]

## 2023-12-01 ENCOUNTER — Inpatient Hospital Stay (HOSPITAL_COMMUNITY): Payer: Medicaid Other

## 2023-12-01 DIAGNOSIS — I4891 Unspecified atrial fibrillation: Secondary | ICD-10-CM

## 2023-12-01 DIAGNOSIS — D62 Acute posthemorrhagic anemia: Secondary | ICD-10-CM

## 2023-12-01 DIAGNOSIS — N179 Acute kidney failure, unspecified: Secondary | ICD-10-CM

## 2023-12-01 LAB — CBC
HCT: 21.9 % — ABNORMAL LOW (ref 36.0–46.0)
Hemoglobin: 7.1 g/dL — ABNORMAL LOW (ref 12.0–15.0)
MCH: 29 pg (ref 26.0–34.0)
MCHC: 32.4 g/dL (ref 30.0–36.0)
MCV: 89.4 fL (ref 80.0–100.0)
Platelets: 71 10*3/uL — ABNORMAL LOW (ref 150–400)
RBC: 2.45 MIL/uL — ABNORMAL LOW (ref 3.87–5.11)
RDW: 14.3 % (ref 11.5–15.5)
WBC: 15 10*3/uL — ABNORMAL HIGH (ref 4.0–10.5)
nRBC: 0 % (ref 0.0–0.2)

## 2023-12-01 LAB — BASIC METABOLIC PANEL
Anion gap: 7 (ref 5–15)
BUN: 18 mg/dL (ref 8–23)
CO2: 25 mmol/L (ref 22–32)
Calcium: 8.4 mg/dL — ABNORMAL LOW (ref 8.9–10.3)
Chloride: 101 mmol/L (ref 98–111)
Creatinine, Ser: 1.27 mg/dL — ABNORMAL HIGH (ref 0.44–1.00)
GFR, Estimated: 47 mL/min — ABNORMAL LOW (ref >60)
Glucose, Bld: 119 mg/dL — ABNORMAL HIGH (ref 70–99)
Potassium: 4.1 mmol/L (ref 3.5–5.1)
Sodium: 133 mmol/L — ABNORMAL LOW (ref 135–145)

## 2023-12-01 LAB — PROTIME-INR
INR: 1.5 — ABNORMAL HIGH (ref 0.8–1.2)
Prothrombin Time: 18.5 s — ABNORMAL HIGH (ref 11.4–15.2)

## 2023-12-01 LAB — GLUCOSE, CAPILLARY
Glucose-Capillary: 114 mg/dL — ABNORMAL HIGH (ref 70–99)
Glucose-Capillary: 139 mg/dL — ABNORMAL HIGH (ref 70–99)
Glucose-Capillary: 140 mg/dL — ABNORMAL HIGH (ref 70–99)
Glucose-Capillary: 142 mg/dL — ABNORMAL HIGH (ref 70–99)
Glucose-Capillary: 150 mg/dL — ABNORMAL HIGH (ref 70–99)
Glucose-Capillary: 154 mg/dL — ABNORMAL HIGH (ref 70–99)

## 2023-12-01 LAB — PREPARE RBC (CROSSMATCH)

## 2023-12-01 MED ORDER — FUROSEMIDE 10 MG/ML IJ SOLN
40.0000 mg | Freq: Once | INTRAMUSCULAR | Status: AC
  Administered 2023-12-01: 40 mg via INTRAVENOUS
  Filled 2023-12-01: qty 4

## 2023-12-01 MED ORDER — WARFARIN SODIUM 2 MG PO TABS: 2.0000 mg | ORAL_TABLET | Freq: Once | ORAL | Status: DC

## 2023-12-01 MED ORDER — ASPIRIN 81 MG PO CHEW
81.0000 mg | CHEWABLE_TABLET | Freq: Every day | ORAL | Status: DC
  Administered 2023-12-01 – 2023-12-07 (×7): 81 mg via ORAL
  Filled 2023-12-01 (×7): qty 1

## 2023-12-01 MED ORDER — SODIUM CHLORIDE 0.9% IV SOLUTION: Freq: Once | INTRAVENOUS | Status: AC

## 2023-12-01 MED ORDER — WARFARIN SODIUM 2 MG PO TABS
2.0000 mg | ORAL_TABLET | Freq: Once | ORAL | Status: AC
  Administered 2023-12-01: 2 mg via ORAL
  Filled 2023-12-01: qty 1

## 2023-12-01 MED ORDER — WARFARIN - PHARMACIST DOSING INPATIENT
Freq: Every day | Status: DC
  Administered 2023-12-04: 1

## 2023-12-01 MED FILL — Heparin Sodium (Porcine) Inj 1000 Unit/ML: INTRAMUSCULAR | Qty: 10 | Status: AC

## 2023-12-01 MED FILL — Electrolyte-R (PH 7.4) Solution: INTRAVENOUS | Qty: 4000 | Status: AC

## 2023-12-01 MED FILL — Mannitol IV Soln 20%: INTRAVENOUS | Qty: 500 | Status: AC

## 2023-12-01 MED FILL — Sodium Bicarbonate IV Soln 8.4%: INTRAVENOUS | Qty: 50 | Status: AC

## 2023-12-01 NOTE — Progress Notes (Signed)
 PHARMACY - ANTICOAGULATION CONSULT NOTE  Pharmacy Consult for warfarin Indication:  mechanical MVR  Allergies  Allergen Reactions   Other Other (See Comments)    Patient is vegetarian no meat can eat eggs    Patient Measurements: Height: 4' 11 (149.9 cm) Weight: 50.1 kg (110 lb 7.2 oz) IBW/kg (Calculated) : 43.2   Vital Signs: Temp: 97.6 F (36.4 C) (02/07 0830) Temp Source: Oral (02/07 0830) BP: 105/91 (02/07 0945) Pulse Rate: 137 (02/07 0945)  Labs: Recent Labs    11/29/23 0728 11/29/23 9081 11/29/23 1207 11/29/23 1646 11/29/23 1755 11/29/23 1756 11/29/23 2057 11/30/23 0259 12/01/23 0423  HGB  --    < > 8.2*  8.0*   < > 7.1*   < > 8.8* 8.2* 7.1*  HCT  --    < > 24.0*  25.1*   < > 22.1*   < > 27.3* 25.5* 21.9*  PLT  --    < > 85*  --  61*  --   --  60* 71*  APTT  --   --  40*  --   --   --   --   --   --   LABPROT 14.7  --  21.1*  --   --   --   --   --   --   INR 1.1  --  1.8*  --   --   --   --   --   --   CREATININE  --    < >  --   --  0.71  --   --  0.77 1.27*   < > = values in this interval not displayed.    Estimated Creatinine Clearance: 30.5 mL/min (A) (by C-G formula based on SCr of 1.27 mg/dL (H)).   Medical History: Past Medical History:  Diagnosis Date   Atrial fibrillation (HCC)    Coronary artery disease    s/p LAD stents   Diabetes mellitus without complication (HCC)    Dyspnea    Headache    HLD (hyperlipidemia)    Hypertension    Memory loss    due to stroke in 2024   Neuromuscular disorder (HCC)    feet   Pneumonia    x 1   Seizure (HCC)    last one was in 09/2023   Stroke [facility]) 2019   Thyroid disease    Walker as ambulation aid       Assessment: Christy Poole with Hx Afib on warfarin PTA 3.75mg  every day except 2.5mg  on Sun INR down 1.4 preop > slightly elevated post op 1.8- not eating much remains nauseated - on zofran   Will begin warfarin low dose and titrate slowly - hgb low 7> PRBC, and pltc down 60> now increasing    Goal of Therapy:  INR 2.5-3.5 Monitor platelets by anticoagulation protocol: Yes   Plan:  Warfarin 2mg  x1  Daily protime and CBC Monitor s/s bleeding   Olam Chalk Pharm.D. CPP, BCPS Clinical Pharmacist [phone] 12/01/2023 10:03 AM

## 2023-12-01 NOTE — Progress Notes (Signed)
 Patient ID: Christy Poole, female   DOB: [birth year], 63 y.o.   MRN: [id number]  TCTS Evening Rounds:  Hemodynamically stable in chronic atrial fib 60's. Had a brady episode today so pacing wires left in. Chest tubes are out.  Transfused 1 unit this am and received lasix  60 today but has not diuresed. Creat bumped to 1.27 this am.  No other problems today.

## 2023-12-01 NOTE — Progress Notes (Signed)
 [address]       [place] [address]             [phone]      2 Days Post-Op  Procedure(s) (LRB): MITRAL VALVE (MV) REPLACEMENT USING ST JUDE MASTERS MECHANICAL VALVE (N/A) TRANSESOPHAGEAL ECHOCARDIOGRAM (TEE) (N/A)   Total Length of Stay:  LOS: 2 days    SUBJECTIVE: Less nauseated Ambulated in halls  Vitals:   12/01/23 0654 12/01/23 0700  BP: (!) 95/59 (!) 96/51  Pulse: (!) 54 (!) 59  Resp: 14 15  Temp: 97.7 F (36.5 C)   SpO2: 97% 98%    Intake/Output      02/06 0701 02/07 0700 02/07 0701 02/08 0700   P.O. 530    I.V. (mL/kg) 73.2 (1.5)    Blood 315    IV Piggyback 317.4    Total Intake(mL/kg) 1235.6 (24.7)    Urine (mL/kg/hr) 810 (0.7)    Blood     Chest Tube 350    Total Output 1160    Net +75.6             albumin  human Stopped (11/30/23 1208)   insulin  Stopped (11/30/23 1136)    CBC    Component Value Date/Time   WBC 15.0 (H) 12/01/2023 0423   RBC 2.45 (L) 12/01/2023 0423   HGB 7.1 (L) 12/01/2023 0423   HGB 13.7 09/29/2023 1110   HCT 21.9 (L) 12/01/2023 0423   HCT 43.8 09/29/2023 1110   PLT 71 (L) 12/01/2023 0423   PLT 141 (L) 09/29/2023 1110   MCV 89.4 12/01/2023 0423   MCV 88 09/29/2023 1110   MCH 29.0 12/01/2023 0423   MCHC 32.4 12/01/2023 0423   RDW 14.3 12/01/2023 0423   RDW 13.0 09/29/2023 1110   LYMPHSABS 1.5 11/10/2022 0912   MONOABS 0.4 12/15/2021 0749   EOSABS 0.2 11/10/2022 0912   BASOSABS 0.1 11/10/2022 0912   CMP     Component Value Date/Time   NA 133 (L) 12/01/2023 0423   NA 140 09/29/2023 1110   K 4.1 12/01/2023 0423   CL 101 12/01/2023 0423   CO2 25 12/01/2023 0423   GLUCOSE 119 (H) 12/01/2023 0423   BUN 18 12/01/2023 0423   BUN 14 09/29/2023 1110   CREATININE 1.27 (H) 12/01/2023 0423   CALCIUM  8.4 (L) 12/01/2023 0423   PROT 7.1 11/27/2023 1155   PROT 7.8 07/12/2023 1327   ALBUMIN  3.9 11/27/2023 1155   ALBUMIN  4.2 02/09/2023 1417   AST 36 11/27/2023 1155   ALT 30  11/27/2023 1155   ALKPHOS 64 11/27/2023 1155   BILITOT 1.3 (H) 11/27/2023 1155   BILITOT 0.7 02/09/2023 1417   GFRNONAA 47 (L) 12/01/2023 0423   ABG    Component Value Date/Time   PHART 7.346 (L) 11/29/2023 1756   PCO2ART 42.4 11/29/2023 1756   PO2ART 74 (L) 11/29/2023 1756   HCO3 23.2 11/29/2023 1756   TCO2 25 11/29/2023 1756   ACIDBASEDEF 2.0 11/29/2023 1756   O2SAT 94 11/29/2023 1756   CBG (last 3)  Recent Labs    11/30/23 2032 11/30/23 2331 12/01/23 0318  GLUCAP 166* 154* 114*  EXAM Lungs: decreased at bases with crackles Card: irreg Ext: Warm Neuro: intact   ASSESSMENT: POD #2 sp mv replacement (mechanical) Hemodynamics stable with rate controlled afib. Will stop amiodarone  Anemia: expected post op will transfuse today. Follow. Plts improving slowly. Will start coumadin  today DC pacing wires. Dc chest tubes after Follow  cr. Hold diuretic today Leave in unit today   [name], MD 12/01/2023

## 2023-12-01 NOTE — Anesthesia Postprocedure Evaluation (Signed)
 Anesthesia Post Note  Patient: Christy Poole  Procedure(s) Performed: MITRAL VALVE (MV) REPLACEMENT USING ST JUDE MASTERS MECHANICAL VALVE (Chest) TRANSESOPHAGEAL ECHOCARDIOGRAM (TEE)     Patient location during evaluation: SICU Anesthesia Type: General Level of consciousness: sedated Pain management: pain level controlled Vital Signs Assessment: post-procedure vital signs reviewed and stable Respiratory status: patient remains intubated per anesthesia plan Cardiovascular status: stable Postop Assessment: no apparent nausea or vomiting Anesthetic complications: no   No notable events documented.                Afsana Liera

## 2023-12-01 NOTE — Progress Notes (Signed)
 NAME:  Christy Poole, MRN:  [id number], DOB:  Mar 24, 1961, LOS: 2 ADMISSION DATE:  11/29/2023, CONSULTATION DATE: 11/28/2022 REFERRING MD: [name], CHIEF COMPLAINT: Status post mitral valve replacement  History of Present Illness:  63 year old female with chronic A-fib, diabetes type 2, coronary artery disease status post stent and rheumatic mitral valve stenosis who started with dyspnea on exertion, today she underwent mechanical mitral valve replacement and closure of left atrial appendage, remained intubated and was transferred to ICU.  PCCM was consulted for help evaluation medical management  Pertinent  Medical History   Past Medical History:  Diagnosis Date   Atrial fibrillation (HCC)    Coronary artery disease    s/p LAD stents   Diabetes mellitus without complication (HCC)    Dyspnea    Headache    HLD (hyperlipidemia)    Hypertension    Memory loss    due to stroke in 2024   Neuromuscular disorder (HCC)    feet   Pneumonia    x 1   Seizure (HCC)    last one was in 09/2023   Stroke [facility]) 2019   Thyroid disease    Walker as ambulation aid      Significant Hospital Events: Including procedures, antibiotic start and stop dates in addition to other pertinent events     Interim History / Subjective:  Remain in slow A-fib Hemoglobin is 7.1, received 1 unit PRBC Complaining of surgical site chest pain  Objective   Blood pressure (!) 113/55, pulse (!) 53, temperature 97.6 F (36.4 C), temperature source Oral, resp. rate 17, height 4' 11 (1.499 m), weight 50.1 kg, SpO2 99%. PAP: (45-54)/(22-33) 48/22 CO:  [3.5 L/min-3.7 L/min] 3.5 L/min CI:  [2.5 L/min/m2-2.6 L/min/m2] 2.5 L/min/m2      Intake/Output Summary (Last 24 hours) at 12/01/2023 0948 Last data filed at 12/01/2023 0800 Gross per 24 hour  Intake 1212.81 ml  Output 905 ml  Net 307.81 ml   Filed Weights   11/29/23 0710 11/30/23 0500 12/01/23 0500  Weight: 48 kg 48.9 kg 50.1 kg     Examination: General: Elderly female, lying on the bed HEENT: [place]/AT, eyes anicteric.  moist mucus membranes Neuro: Alert, awake following commands Chest: Central sternotomy incision looks clean and dry, coarse breath sounds, no wheezes or rhonchi.  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 Chronic A-fib status post left atrial appendage closure Coronary artery disease Continue aspirin  and statin Amiodarone  was stopped as patient became bradycardic but remained in A-fib into 50s-60s Continue as needed metoprolol  Chest tube management TCTS Chest tube output is 350 cc Pacer wire and chest tubes will be discontinued today per TCTS Continue pain control with tramadol , oxycodone  and morphine   Acute respiratory insufficiency, postop Currently on 4 L nasal cannula oxygen Encourage incentive spirometry and flutter valve  Hyperlipidemia Continue atorvastatin   Diabetes type 2 Patient hemoglobin A1c is 5.8 Continue lighting scale insulin   Expected perioperative blood loss anemia 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 Monitor H/H and PLT counts Receiving 1 more unit PRBC today due to hemoglobin being 7.1 Platelets counts are slowly improving Watch for signs  of bleeding  Acute kidney injury Patient serum creatinine went up from 0.7-1.27 Monitor intake and output Avoid nephrotoxic agent   Best Practice (right click and Reselect all SmartList Selections daily)   Diet/type: Regular consistency DVT prophylaxis: Resume Coumadin  GI prophylaxis: PPI Lines: Central line, Arterial Line, and yes and  it is still needed Foley:  Yes, and it is still needed Code Status:  full code Last date of multidisciplinary goals of care discussion [Per primary team]   Labs   CBC: Recent Labs  Lab 11/27/23 1155 11/29/23 0918 11/29/23 1032 11/29/23 1033 11/29/23 1207 11/29/23 1646 11/29/23 1755 11/29/23 1756 11/29/23 2057 11/30/23 0259 12/01/23 0423  WBC 5.4  --   --   --  12.3*  --  9.5  --   --  11.4* 15.0*  HGB 12.9   < > 7.0*   < > 8.2*  8.0*   < > 7.1* 6.5* 8.8* 8.2* 7.1*  HCT 40.1   < > 21.2*   < > 24.0*  25.1*   < > 22.1* 19.0* 27.3* 25.5* 21.9*  MCV 89.1  --   --   --  89.3  --  89.5  --   --  88.2 89.4  PLT 166  --  73*  --  85*  --  61*  --   --  60* 71*   < > = values in this interval not displayed.    Basic Metabolic Panel: Recent Labs  Lab 11/27/23 1155 11/29/23 0918 11/29/23 1033 11/29/23 1108 11/29/23 1111 11/29/23 1207 11/29/23 1646 11/29/23 1755 11/29/23 1756 11/30/23 0259 12/01/23 0423  NA 143   < > 139   < > 138   < > 139 140 141 137 133*  K 4.8   < > 4.2   < > 4.2   < > 4.5 4.1 4.2 4.1 4.1  CL 108   < > 103  --  102  --   --  106  --  107 101  CO2 25  --   --   --   --   --   --  24  --  22 25  GLUCOSE 103*   < > 119*  --  117*  --   --  114*  --  122* 119*  BUN 12   < > 10  --  10  --   --  8  --  9 18  CREATININE 1.03*   < > 0.50  --  0.60  --   --  0.71  --  0.77 1.27*  CALCIUM  10.4*  --   --   --   --   --   --  8.1*  --  8.2* 8.4*  MG  --   --   --   --   --   --   --  3.7*  --  2.8*  --    < > = values in this interval not displayed.   GFR: Estimated Creatinine Clearance: 30.5 mL/min (A) (by C-G formula based on SCr of 1.27 mg/dL (H)). Recent Labs  Lab 11/29/23 1207 11/29/23 1755 11/30/23 0259 12/01/23 0423  WBC 12.3* 9.5 11.4* 15.0*    Liver Function Tests: Recent Labs  Lab 11/27/23 1155  AST 36  ALT 30  ALKPHOS 64  BILITOT 1.3*  PROT 7.1  ALBUMIN  3.9   No results for input(s): LIPASE, AMYLASE in the last 168 hours. No  results for input(s): AMMONIA in  the last 168 hours.  ABG    Component Value Date/Time   PHART 7.346 (L) 11/29/2023 1756   PCO2ART 42.4 11/29/2023 1756   PO2ART 74 (L) 11/29/2023 1756   HCO3 23.2 11/29/2023 1756   TCO2 25 11/29/2023 1756   ACIDBASEDEF 2.0 11/29/2023 1756   O2SAT 94 11/29/2023 1756     Coagulation Profile: Recent Labs  Lab 11/27/23 1155 11/29/23 0728 11/29/23 1207  INR 1.4* 1.1 1.8*    Cardiac Enzymes: No results for input(s): CKTOTAL, CKMB, CKMBINDEX, TROPONINI in the last 168 hours.  HbA1C: Hgb A1c MFr Bld  Date/Time Value Ref Range Status  11/27/2023 11:55 AM 5.8 (H) 4.8 - 5.6 % Final    Comment:    (NOTE) Pre diabetes:          5.7%-6.4%  Diabetes:              >6.4%  Glycemic control for   <7.0% adults with diabetes   09/12/2023 02:33 AM 7.0 (H) 4.8 - 5.6 % Final    Comment:    (NOTE) Pre diabetes:          5.7%-6.4%  Diabetes:              >6.4%  Glycemic control for   <7.0% adults with diabetes     CBG: Recent Labs  Lab 11/30/23 1711 11/30/23 2032 11/30/23 2331 12/01/23 0318 12/01/23 0749  GLUCAP 137* 166* 154* 114* 150*      Valinda Novas, MD [facility] See Amion for pager If no response to pager, please call [phone] until 7pm After 7pm, Please call E-link [phone]

## 2023-12-02 LAB — BASIC METABOLIC PANEL
Anion gap: 8 (ref 5–15)
BUN: 26 mg/dL — ABNORMAL HIGH (ref 8–23)
CO2: 24 mmol/L (ref 22–32)
Calcium: 8.2 mg/dL — ABNORMAL LOW (ref 8.9–10.3)
Chloride: 102 mmol/L (ref 98–111)
Creatinine, Ser: 1.23 mg/dL — ABNORMAL HIGH (ref 0.44–1.00)
GFR, Estimated: 49 mL/min — ABNORMAL LOW (ref >60)
Glucose, Bld: 119 mg/dL — ABNORMAL HIGH (ref 70–99)
Potassium: 3.9 mmol/L (ref 3.5–5.1)
Sodium: 134 mmol/L — ABNORMAL LOW (ref 135–145)

## 2023-12-02 LAB — TYPE AND SCREEN
ABO/RH(D): AB POS
Antibody Screen: NEGATIVE
Unit division: 0
Unit division: 0

## 2023-12-02 LAB — CBC
HCT: 27.8 % — ABNORMAL LOW (ref 36.0–46.0)
Hemoglobin: 9.2 g/dL — ABNORMAL LOW (ref 12.0–15.0)
MCH: 29.8 pg (ref 26.0–34.0)
MCHC: 33.1 g/dL (ref 30.0–36.0)
MCV: 90 fL (ref 80.0–100.0)
Platelets: 73 10*3/uL — ABNORMAL LOW (ref 150–400)
RBC: 3.09 MIL/uL — ABNORMAL LOW (ref 3.87–5.11)
RDW: 14.3 % (ref 11.5–15.5)
WBC: 11.7 10*3/uL — ABNORMAL HIGH (ref 4.0–10.5)
nRBC: 0 % (ref 0.0–0.2)

## 2023-12-02 LAB — BPAM RBC
Blood Product Expiration Date: 202502282359
Blood Product Expiration Date: 202503152359
ISSUE DATE / TIME: 202502051805
ISSUE DATE / TIME: 202502070651
Unit Type and Rh: 6200
Unit Type and Rh: 8400

## 2023-12-02 LAB — GLUCOSE, CAPILLARY
Glucose-Capillary: 123 mg/dL — ABNORMAL HIGH (ref 70–99)
Glucose-Capillary: 108 mg/dL — ABNORMAL HIGH (ref 70–99)
Glucose-Capillary: 87 mg/dL (ref 70–99)
Glucose-Capillary: 107 mg/dL — ABNORMAL HIGH (ref 70–99)
Glucose-Capillary: 118 mg/dL — ABNORMAL HIGH (ref 70–99)
Glucose-Capillary: 167 mg/dL — ABNORMAL HIGH (ref 70–99)

## 2023-12-02 LAB — PROTIME-INR
INR: 1.6 — ABNORMAL HIGH (ref 0.8–1.2)
Prothrombin Time: 19.2 s — ABNORMAL HIGH (ref 11.4–15.2)

## 2023-12-02 MED ORDER — FE FUM-VIT C-VIT B12-FA 460-60-0.01-1 MG PO CAPS
1.0000 | ORAL_CAPSULE | Freq: Every day | ORAL | Status: DC
  Administered 2023-12-03 – 2023-12-07 (×5): 1 via ORAL
  Filled 2023-12-02 (×5): qty 1

## 2023-12-02 MED ORDER — WARFARIN SODIUM 2 MG PO TABS
2.0000 mg | ORAL_TABLET | Freq: Once | ORAL | Status: AC
  Administered 2023-12-02: 2 mg via ORAL
  Filled 2023-12-02: qty 1

## 2023-12-02 MED ORDER — INSULIN ASPART 100 UNIT/ML IJ SOLN
0.0000 [IU] | Freq: Three times a day (TID) | INTRAMUSCULAR | Status: DC
  Administered 2023-12-03: 2 [IU] via SUBCUTANEOUS
  Administered 2023-12-04: 4 [IU] via SUBCUTANEOUS
  Administered 2023-12-04: 2 [IU] via SUBCUTANEOUS
  Administered 2023-12-05 – 2023-12-06 (×2): 4 [IU] via SUBCUTANEOUS

## 2023-12-02 NOTE — Progress Notes (Signed)
 Patient ID: Christy Poole, female   DOB: 1961/08/23, 63 y.o.   MRN: [id number]  TCTS Evening Rounds:  Hemodynamically stable   UO ok. Received more lasix  this pm.  Was able to ambulate 2 squares today.  Had BM.  Received Coumadin  tonight.

## 2023-12-02 NOTE — Progress Notes (Signed)
 3 Days Post-Op Procedure(s) (LRB): MITRAL VALVE (MV) REPLACEMENT USING ST JUDE MASTERS MECHANICAL VALVE (N/A) TRANSESOPHAGEAL ECHOCARDIOGRAM (TEE) (N/A) Subjective:  No complaints per son who is in room with her.   Objective: Vital signs in last 24 hours: Temp:  [97.5 F (36.4 C)-98.5 F (36.9 C)] 98.3 F (36.8 C) (02/08 0749) Pulse Rate:  [61-84] 68 (02/08 1000) Cardiac Rhythm: Atrial fibrillation (02/08 0800) Resp:  [11-27] 17 (02/08 0900) BP: (88-136)/(46-76) 127/68 (02/08 1000) SpO2:  [42 %-100 %] 100 % (02/08 1000) Weight:  [49.8 kg] 49.8 kg (02/08 0500)  Hemodynamic parameters for last 24 hours:    Intake/Output from previous day: 02/07 0701 - 02/08 0700 In: 337.5 [I.V.:22.5; Blood:315] Out: 1050 [Urine:1020; Chest Tube:30] Intake/Output this shift: Total I/O In: -  Out: 220 [Urine:220]  General appearance: alert and cooperative Neurologic: intact Heart: irregularly irregular rhythm Lungs: diminished breath sounds RLL, left base. Extremities: edema mild in upper ext, no in lower Wound: incision healing well  Lab Results: Recent Labs    12/01/23 0423 12/02/23 0223  WBC 15.0* 11.7*  HGB 7.1* 9.2*  HCT 21.9* 27.8*  PLT 71* 73*   BMET:  Recent Labs    12/01/23 0423 12/02/23 0223  NA 133* 134*  K 4.1 3.9  CL 101 102  CO2 25 24  GLUCOSE 119* 119*  BUN 18 26*  CREATININE 1.27* 1.23*  CALCIUM  8.4* 8.2*    PT/INR:  Recent Labs    12/02/23 0223  LABPROT 19.2*  INR 1.6*   ABG    Component Value Date/Time   PHART 7.346 (L) 11/29/2023 1756   HCO3 23.2 11/29/2023 1756   TCO2 25 11/29/2023 1756   ACIDBASEDEF 2.0 11/29/2023 1756   O2SAT 94 11/29/2023 1756   CBG (last 3)  Recent Labs    12/01/23 2335 12/02/23 0311 12/02/23 0746  GLUCAP 139* 108* 87    Assessment/Plan: S/P Procedure(s) (LRB): MITRAL VALVE (MV) REPLACEMENT USING ST JUDE MASTERS MECHANICAL VALVE (N/A) TRANSESOPHAGEAL ECHOCARDIOGRAM (TEE) (N/A)  POD  3  Hemodynamically stable in chronic rate controlled atrial fib. Remove pacing wires today.  Coumadin  per pharmacy for mechanical MVR. INR 1.6 today.   Wt is about 4 lbs over preop. Continue diuresis.  She has been transferring to chair but not walking. PT, OT consults.  Hgb improved after transfusion yesterday.  Thrombocytopenia: no heparin .   LOS: 3 days    Christy Poole 12/02/2023

## 2023-12-02 NOTE — Progress Notes (Signed)
 PHARMACY - ANTICOAGULATION CONSULT NOTE  Pharmacy Consult for warfarin Indication:  mechanical MVR  Allergies  Allergen Reactions   Other Other (See Comments)    Patient is vegetarian no meat can eat eggs    Patient Measurements: Height: 4' 11 (149.9 cm) Weight: 49.8 kg (109 lb 12.6 oz) IBW/kg (Calculated) : 43.2   Vital Signs: Temp: 97.8 F (36.6 C) (02/08 0313) Temp Source: Oral (02/08 0313) BP: 103/55 (02/08 0600) Pulse Rate: 84 (02/08 0700)  Labs: Recent Labs    11/29/23 1207 11/29/23 1646 11/30/23 0259 12/01/23 0423 12/01/23 1504 12/02/23 0223  HGB 8.2*  8.0*   < > 8.2* 7.1*  --  9.2*  HCT 24.0*  25.1*   < > 25.5* 21.9*  --  27.8*  PLT 85*   < > 60* 71*  --  73*  APTT 40*  --   --   --   --   --   LABPROT 21.1*  --   --   --  18.5* 19.2*  INR 1.8*  --   --   --  1.5* 1.6*  CREATININE  --    < > 0.77 1.27*  --  1.23*   < > = values in this interval not displayed.    Estimated Creatinine Clearance: 31.5 mL/min (A) (by C-G formula based on SCr of 1.23 mg/dL (H)).   Medical History: Past Medical History:  Diagnosis Date   Atrial fibrillation (HCC)    Coronary artery disease    s/p LAD stents   Diabetes mellitus without complication (HCC)    Dyspnea    Headache    HLD (hyperlipidemia)    Hypertension    Memory loss    due to stroke in 2024   Neuromuscular disorder (HCC)    feet   Pneumonia    x 1   Seizure (HCC)    last one was in 09/2023   Stroke [facility]) 2019   Thyroid disease    Walker as ambulation aid       Assessment: 62yof with Hx Afib on warfarin PTA 3.75mg  every day except 2.5mg  on Sun Post-op pt not eating much remains nauseated - on zofran   Will begin warfarin low dose and titrate slowly  INR remains subtherapeutic at 1.6 (+0.1) after warfarin 2 mg x1. Hgb improved 7.1 >9.2, Plt stable.   Goal of Therapy:  INR 2.5-3.5 Monitor platelets by anticoagulation protocol: Yes   Plan:  Warfarin 2mg  x1  Daily protime and  CBC Monitor s/s bleeding   Signe Dawn, PharmD PGY2 Cardiology Pharmacy Resident [phone] 12/02/2023 7:12 AM

## 2023-12-03 ENCOUNTER — Inpatient Hospital Stay (HOSPITAL_COMMUNITY): Payer: Medicaid Other

## 2023-12-03 LAB — PROTIME-INR
INR: 1.6 — ABNORMAL HIGH (ref 0.8–1.2)
Prothrombin Time: 19.2 s — ABNORMAL HIGH (ref 11.4–15.2)

## 2023-12-03 LAB — BASIC METABOLIC PANEL
Anion gap: 6 (ref 5–15)
BUN: 28 mg/dL — ABNORMAL HIGH (ref 8–23)
CO2: 28 mmol/L (ref 22–32)
Calcium: 8.4 mg/dL — ABNORMAL LOW (ref 8.9–10.3)
Chloride: 102 mmol/L (ref 98–111)
Creatinine, Ser: 0.95 mg/dL (ref 0.44–1.00)
GFR, Estimated: >60 mL/min (ref >60)
Glucose, Bld: 155 mg/dL — ABNORMAL HIGH (ref 70–99)
Potassium: 3.7 mmol/L (ref 3.5–5.1)
Sodium: 136 mmol/L (ref 135–145)

## 2023-12-03 LAB — GLUCOSE, CAPILLARY
Glucose-Capillary: 117 mg/dL — ABNORMAL HIGH (ref 70–99)
Glucose-Capillary: 112 mg/dL — ABNORMAL HIGH (ref 70–99)
Glucose-Capillary: 133 mg/dL — ABNORMAL HIGH (ref 70–99)
Glucose-Capillary: 144 mg/dL — ABNORMAL HIGH (ref 70–99)

## 2023-12-03 LAB — CBC
HCT: 26.2 % — ABNORMAL LOW (ref 36.0–46.0)
Hemoglobin: 8.5 g/dL — ABNORMAL LOW (ref 12.0–15.0)
MCH: 29.7 pg (ref 26.0–34.0)
MCHC: 32.4 g/dL (ref 30.0–36.0)
MCV: 91.6 fL (ref 80.0–100.0)
Platelets: 96 10*3/uL — ABNORMAL LOW (ref 150–400)
RBC: 2.86 MIL/uL — ABNORMAL LOW (ref 3.87–5.11)
RDW: 14.7 % (ref 11.5–15.5)
WBC: 8.8 10*3/uL (ref 4.0–10.5)
nRBC: 0 % (ref 0.0–0.2)

## 2023-12-03 MED ORDER — POTASSIUM CHLORIDE CRYS ER 20 MEQ PO TBCR
40.0000 meq | EXTENDED_RELEASE_TABLET | Freq: Once | ORAL | Status: AC
  Administered 2023-12-03: 40 meq via ORAL
  Filled 2023-12-03: qty 2

## 2023-12-03 MED ORDER — FUROSEMIDE 10 MG/ML IJ SOLN
40.0000 mg | Freq: Two times a day (BID) | INTRAMUSCULAR | Status: DC
  Administered 2023-12-03: 40 mg via INTRAVENOUS
  Filled 2023-12-03: qty 4

## 2023-12-03 MED ORDER — METOLAZONE 2.5 MG PO TABS
2.5000 mg | ORAL_TABLET | Freq: Once | ORAL | Status: AC
  Administered 2023-12-03: 2.5 mg via ORAL
  Filled 2023-12-03: qty 1

## 2023-12-03 MED ORDER — POTASSIUM CHLORIDE CRYS ER 20 MEQ PO TBCR
20.0000 meq | EXTENDED_RELEASE_TABLET | ORAL | Status: AC
  Administered 2023-12-03 (×3): 20 meq via ORAL
  Filled 2023-12-03 (×3): qty 1

## 2023-12-03 MED ORDER — WARFARIN SODIUM 2 MG PO TABS
2.0000 mg | ORAL_TABLET | Freq: Once | ORAL | Status: AC
  Administered 2023-12-03: 2 mg via ORAL
  Filled 2023-12-03: qty 1

## 2023-12-03 NOTE — Evaluation (Signed)
 Physical Therapy Evaluation Patient Details Name: Christy Poole MRN: [id number] DOB: May 26, 1961 Today's Date: 12/03/2023  History of Present Illness  Pt is 63 yo presenting [facility] on 11/28/23 for scheduled surgery. Pt currently s/p mitral vave replacement on 11/29/23. PMH: A-fib, DM, stroe, thyroid disease. [id number]  Clinical Impression  Pt is presenting below baseline level of functioning. RN reported that pt was ambulating around unit this morning.  Currently pt is limited to Min A with gait due to impaired balance and weakness. Pt had erratic gait speed very slow then quick with poor anterior control of AD. Pt was CGA for sit to stand with Moderate reminders to maintain sternal precautions. Pt son states he is available to assist when not at work. Due to pt current functional status, home set up and available assistance at home recommending skilled physical therapy services 3x/week in order to address strength, balance and functional mobility to decrease risk for falls, injury and re-hospitalization.         If plan is discharge home, recommend the following: A little help with walking and/or transfers;Assist for transportation;Help with stairs or ramp for entrance;Assistance with cooking/housework     Equipment Recommendations BSC/3in1     Functional Status Assessment Patient has had a recent decline in their functional status and demonstrates the ability to make significant improvements in function in a reasonable and predictable amount of time.     Precautions / Restrictions Precautions Precautions: Sternal Precaution Booklet Issued: Yes (comment) Precaution Comments: reviewed precautions throughout Restrictions Weight Bearing Restrictions Per Provider Order: Yes RUE Weight Bearing Per Provider Order: Non weight bearing LUE Weight Bearing Per Provider Order: Non weight bearing      Mobility  Bed Mobility     General bed mobility comments: Pt up in recliner on arrival and  departure.    Transfers Overall transfer level: Needs assistance Equipment used: None Transfers: Sit to/from Stand Sit to Stand: Contact guard assist           General transfer comment: CGA for safety able to stand well while following sternal precautions. Requires moderate reminders to maintain sternal precautions to prevent pulling, pushing    Ambulation/Gait Ambulation/Gait assistance: Min assist Gait Distance (Feet): 20 Feet Assistive device: Rolling walker (2 wheels) Gait Pattern/deviations: Step-through pattern Gait velocity: slow followed by quick steps Gait velocity interpretation: <1.8 ft/sec, indicate of risk for recurrent falls   General Gait Details: pt took short low steps followed by short quick reiprocal steps with difficulty maintainint appropriate proximity to AD during gait. Min A for baance Frequenty asing if pt was dizzy/lightheaded. Pt stated no each time. Pt then started to vomit. Pt assisted to chair which was brought behind pt and gait was terminated at this time  Stairs Stairs:  (unable this date due to vomiting/nausea)           Balance Overall balance assessment: Needs assistance Sitting-balance support: Single extremity supported, No upper extremity supported, Feet supported, Feet unsupported Sitting balance-Leahy Scale: Good     Standing balance support: Bilateral upper extremity supported, Reliant on assistive device for balance, During functional activity Standing balance-Leahy Scale: Poor Standing balance comment: Needs external assist during gait to prevent fal         Pertinent Vitals/Pain Pain Assessment Pain Assessment: 0-10 Pain Score: 7  Pain Location: sternum/surgica site Pain Descriptors / Indicators: Discomfort, Aching Pain Intervention(s): Monitored during session, Limited activity within patient's tolerance, Patient requesting pain meds-RN notified    Home Living Family/patient expects  to be discharged to:: Private  residence Living Arrangements: Children (lives with son) Available Help at Discharge: Family;Available PRN/intermittently (son and dtr in law work) Type of Home: House Home Access: Stairs to enter   Secretary/administrator of Steps: 4 Alternate Level Stairs-Number of Steps: 20+ steps Home Layout: Two level (son states first floor is just a kitchen and a hallway) Home Equipment: Agricultural Consultant (2 wheels);Other (comment) (adjustable recliner.)      Prior Function Prior Level of Function : Needs assist  Cognitive Assist : ADLs (cognitive)   ADLs (Cognitive): Step by step cues Physical Assist : ADLs (physical)   ADLs (physical): Feeding;Grooming;Bathing;Dressing;Toileting;IADLs Mobility Comments: uses RW in home. ADLs Comments: Pt needs help with dressing, bathing and toileting. Pt DIL assists normaly but son states now it is just me unfortunatey unclear why daughter in law is unable to assist     Extremity/Trunk Assessment   Upper Extremity Assessment Upper Extremity Assessment: Defer to OT evaluation    Lower Extremity Assessment Lower Extremity Assessment: Generalized weakness    Cervical / Trunk Assessment Cervical / Trunk Assessment: Normal  Communication   Communication Communication: Other (comment) (some difficulty understanding interpreter.) Cueing Techniques: Verbal cues;Tactile cues;Gestural cues  Cognition Arousal: Alert Behavior During Therapy: WFL for tasks assessed/performed, Anxious Overall Cognitive Status: History of cognitive impairments - at baseline        General Comments General comments (skin integrity, edema, etc.): Incision is dry and intact. O2 sats above 90% on 2L O2 via [place] and HR in the 70's/80's throughout session        Assessment/Plan    PT Assessment Patient needs continued PT services  PT Problem List Decreased strength;Decreased balance;Decreased mobility;Decreased activity tolerance;Decreased safety awareness       PT  Treatment Interventions DME instruction;Therapeutic activities;Gait training;Therapeutic exercise;Patient/family education;Stair training;Balance training;Functional mobility training    PT Goals (Current goals can be found in the Care Plan section)  Acute Rehab PT Goals Patient Stated Goal: to return home with family PT Goal Formulation: With patient Time For Goal Achievement: 12/17/23 Potential to Achieve Goals: Good    Frequency Min 1X/week        AM-PAC PT 6 Clicks Mobility  Outcome Measure Help needed turning from your back to your side while in a flat bed without using bedrails?: A Little Help needed moving from lying on your back to sitting on the side of a flat bed without using bedrails?: A Little Help needed moving to and from a bed to a chair (including a wheelchair)?: A Little Help needed standing up from a chair using your arms (e.g., wheelchair or bedside chair)?: A Little Help needed to walk in hospital room?: A Little Help needed climbing 3-5 steps with a railing? : A Lot 6 Click Score: 17    End of Session Equipment Utilized During Treatment: Gait belt;Oxygen Activity Tolerance: Other (comment) (pt limited by nausea/vomiting.) Patient left: in chair;with call bell/phone within reach Nurse Communication: Mobility status;Other (comment) (nausea/vomiting) PT Visit Diagnosis: Unsteadiness on feet (R26.81);Other abnormalities of gait and mobility (R26.89)    Time: 8991-8943 PT Time Calculation (min) (ACUTE ONLY): 48 min   Charges:   PT Evaluation $PT Eval Low Complexity: 1 Low PT Treatments $Therapeutic Activity: 23-37 mins PT General Charges $$ ACUTE PT VISIT: 1 Visit       Dorothyann Maier, DPT, CLT  Acute Rehabilitation Services Office: [phone] (Secure chat preferred)   Dorothyann VEAR Maier 12/03/2023, 11:11 AM

## 2023-12-03 NOTE — Progress Notes (Signed)
 PHARMACY - ANTICOAGULATION CONSULT NOTE  Pharmacy Consult for warfarin Indication:  mechanical MVR  Allergies  Allergen Reactions   Other Other (See Comments)    Patient is vegetarian no meat can eat eggs    Patient Measurements: Height: 4' 11 (149.9 cm) Weight: 51.2 kg (112 lb 14 oz) IBW/kg (Calculated) : 43.2   Vital Signs: Temp: 97.7 F (36.5 C) (02/08 2300) Temp Source: Oral (02/08 2300) BP: 114/61 (02/09 0600) Pulse Rate: 82 (02/09 0600)  Labs: Recent Labs    12/01/23 0423 12/01/23 1504 12/02/23 0223 12/03/23 0422  HGB 7.1*  --  9.2* 8.5*  HCT 21.9*  --  27.8* 26.2*  PLT 71*  --  73* 96*  LABPROT  --  18.5* 19.2* 19.2*  INR  --  1.5* 1.6* 1.6*  CREATININE 1.27*  --  1.23* 0.95    Estimated Creatinine Clearance: 40.8 mL/min (by C-G formula based on SCr of 0.95 mg/dL).   Medical History: Past Medical History:  Diagnosis Date   Atrial fibrillation (HCC)    Coronary artery disease    s/p LAD stents   Diabetes mellitus without complication (HCC)    Dyspnea    Headache    HLD (hyperlipidemia)    Hypertension    Memory loss    due to stroke in 2024   Neuromuscular disorder (HCC)    feet   Pneumonia    x 1   Seizure (HCC)    last one was in 09/2023   Stroke [facility]) 2019   Thyroid disease    Walker as ambulation aid       Assessment: 14 YOF with Hx Afib on warfarin PTA 3.75mg  every day except 2.5mg  on Sun Post-op pt still not eating much, nausea is improving - on zofran   Start warfarin therapy 2/7, using low dose and titrate slowly  INR remains subtherapeutic at 1.6 (+/- 0) after warfarin 2 mg x1. CBC is stable. Pt with no signs of bleeding.  Goal of Therapy:  INR 2.5-3.5 Monitor platelets by anticoagulation protocol: Yes   Plan:  Warfarin 2mg  x1  Daily protime and CBC Monitor s/s bleeding   Signe Dawn, PharmD PGY2 Cardiology Pharmacy Resident [phone] 12/03/2023 7:08 AM

## 2023-12-03 NOTE — Progress Notes (Signed)
 Patient ID: Christy Poole, female   DOB: December 05, 1960, 63 y.o.   MRN: [id number]  TCTS evening rounds:  Hemodynamically stable   Sats 99%  Continues to diurese.  Walked with PT today.

## 2023-12-03 NOTE — Progress Notes (Signed)
 4 Days Post-Op Procedure(s) (LRB): MITRAL VALVE (MV) REPLACEMENT USING ST JUDE MASTERS MECHANICAL VALVE (N/A) TRANSESOPHAGEAL ECHOCARDIOGRAM (TEE) (N/A) Subjective: No complaints. Walked well with walker yesterday. Eating some.  Objective: Vital signs in last 24 hours: Temp:  [97.7 F (36.5 C)-98.4 F (36.9 C)] 97.7 F (36.5 C) (02/08 2300) Pulse Rate:  [67-110] 81 (02/09 0836) Cardiac Rhythm: Atrial fibrillation (02/09 0831) Resp:  [9-21] 11 (02/09 0800) BP: (93-127)/(54-88) 103/65 (02/09 0800) SpO2:  [89 %-100 %] 100 % (02/09 0836) Weight:  [51.2 kg] 51.2 kg (02/09 0600)  Hemodynamic parameters for last 24 hours:    Intake/Output from previous day: 02/08 0701 - 02/09 0700 In: 300 [P.O.:300] Out: 1250 [Urine:1250] Intake/Output this shift: No intake/output data recorded.  General appearance: alert and cooperative Neurologic: intact Heart: irregularly irregular rhythm, crisp mechanical valve click Lungs: diminished breath sounds bibasilar Extremities: edema still moderate Wound: incision healing well  Lab Results: Recent Labs    12/02/23 0223 12/03/23 0422  WBC 11.7* 8.8  HGB 9.2* 8.5*  HCT 27.8* 26.2*  PLT 73* 96*   BMET:  Recent Labs    12/02/23 0223 12/03/23 0422  NA 134* 136  K 3.9 3.7  CL 102 102  CO2 24 28  GLUCOSE 119* 155*  BUN 26* 28*  CREATININE 1.23* 0.95  CALCIUM  8.2* 8.4*    PT/INR:  Recent Labs    12/03/23 0422  LABPROT 19.2*  INR 1.6*   ABG    Component Value Date/Time   PHART 7.346 (L) 11/29/2023 1756   HCO3 23.2 11/29/2023 1756   TCO2 25 11/29/2023 1756   ACIDBASEDEF 2.0 11/29/2023 1756   O2SAT 94 11/29/2023 1756   CBG (last 3)  Recent Labs    12/02/23 1628 12/02/23 2218 12/03/23 0623  GLUCAP 118* 167* 117*   CXR: persistent small right pleural effusion and right basilar atelectasis.  Assessment/Plan: S/P Procedure(s) (LRB): MITRAL VALVE (MV) REPLACEMENT USING ST JUDE MASTERS MECHANICAL VALVE  (N/A) TRANSESOPHAGEAL ECHOCARDIOGRAM (TEE) (N/A)  POD 4  Hemodynamically stable in chronic rate controlled atrial fibrillation.   INR 1.6 after two doses of 2 mg Coumadin  per pharmacy.  Wt is increased 3 lbs from yesterday and 6-7 lbs over preop. Still edematous. Will increase lasix  and add 2.5 metolazone  today. Continue KCL replacement.  Thrombocytopenia improving. No heparin .  DC sleeve. Keep foley today for diuresis.  IS, ambulation.  LOS: 4 days    Dorise MARLA Fellers 12/03/2023

## 2023-12-04 ENCOUNTER — Inpatient Hospital Stay (HOSPITAL_COMMUNITY): Payer: Medicaid Other

## 2023-12-04 DIAGNOSIS — E1165 Type 2 diabetes mellitus with hyperglycemia: Secondary | ICD-10-CM

## 2023-12-04 DIAGNOSIS — J810 Acute pulmonary edema: Secondary | ICD-10-CM

## 2023-12-04 DIAGNOSIS — E875 Hyperkalemia: Secondary | ICD-10-CM

## 2023-12-04 LAB — GLUCOSE, CAPILLARY
Glucose-Capillary: 98 mg/dL (ref 70–99)
Glucose-Capillary: 145 mg/dL — ABNORMAL HIGH (ref 70–99)
Glucose-Capillary: 165 mg/dL — ABNORMAL HIGH (ref 70–99)
Glucose-Capillary: 113 mg/dL — ABNORMAL HIGH (ref 70–99)

## 2023-12-04 LAB — BASIC METABOLIC PANEL
Anion gap: 10 (ref 5–15)
BUN: 19 mg/dL (ref 8–23)
CO2: 31 mmol/L (ref 22–32)
Calcium: 9.5 mg/dL (ref 8.9–10.3)
Chloride: 94 mmol/L — ABNORMAL LOW (ref 98–111)
Creatinine, Ser: 0.84 mg/dL (ref 0.44–1.00)
GFR, Estimated: >60 mL/min (ref >60)
Glucose, Bld: 97 mg/dL (ref 70–99)
Potassium: 5.2 mmol/L — ABNORMAL HIGH (ref 3.5–5.1)
Sodium: 135 mmol/L (ref 135–145)

## 2023-12-04 LAB — PROTIME-INR
INR: 1.6 — ABNORMAL HIGH (ref 0.8–1.2)
Prothrombin Time: 18.9 s — ABNORMAL HIGH (ref 11.4–15.2)

## 2023-12-04 LAB — CBC
HCT: 26.4 % — ABNORMAL LOW (ref 36.0–46.0)
Hemoglobin: 8.6 g/dL — ABNORMAL LOW (ref 12.0–15.0)
MCH: 29.7 pg (ref 26.0–34.0)
MCHC: 32.6 g/dL (ref 30.0–36.0)
MCV: 91 fL (ref 80.0–100.0)
Platelets: 138 10*3/uL — ABNORMAL LOW (ref 150–400)
RBC: 2.9 MIL/uL — ABNORMAL LOW (ref 3.87–5.11)
RDW: 14.5 % (ref 11.5–15.5)
WBC: 8.5 10*3/uL (ref 4.0–10.5)
nRBC: 0.4 % — ABNORMAL HIGH (ref 0.0–0.2)

## 2023-12-04 MED ORDER — WARFARIN SODIUM 5 MG PO TABS
5.0000 mg | ORAL_TABLET | Freq: Once | ORAL | Status: AC
  Administered 2023-12-04: 5 mg via ORAL
  Filled 2023-12-04: qty 1

## 2023-12-04 MED ORDER — ACETAMINOPHEN 325 MG PO TABS
650.0000 mg | ORAL_TABLET | Freq: Four times a day (QID) | ORAL | Status: DC | PRN
  Administered 2023-12-06: 650 mg via ORAL
  Filled 2023-12-04: qty 2

## 2023-12-04 MED ORDER — WARFARIN SODIUM 5 MG PO TABS: 5.0000 mg | ORAL_TABLET | Freq: Once | ORAL | Status: DC

## 2023-12-04 MED ORDER — WARFARIN SODIUM 2.5 MG PO TABS: 3.7500 mg | ORAL_TABLET | Freq: Once | ORAL | Status: DC

## 2023-12-04 MED ORDER — FUROSEMIDE 10 MG/ML IJ SOLN
40.0000 mg | Freq: Every day | INTRAMUSCULAR | Status: DC
  Administered 2023-12-04: 40 mg via INTRAVENOUS
  Filled 2023-12-04: qty 4

## 2023-12-04 NOTE — Progress Notes (Signed)
      [address]       [place] [address]             [phone]      POD # 5 MVR  Up in chair  BP (!) 141/69 (BP Location: Right Arm)   Pulse 91   Temp 98.2 F (36.8 C) (Oral)   Resp 17   Ht 4\' 11"  (1.499 m)   Wt 49.1 kg   SpO2 91%   BMI 21.86 kg/m    Intake/Output Summary (Last 24 hours) at 12/04/2023 1659 Last data filed at 12/04/2023 1100 Gross per 24 hour  Intake 560 ml  Output 3030 ml  Net -2470 ml   Stable day  Landon Pinion C. Luna Salinas, MD [facility] [phone]

## 2023-12-04 NOTE — Progress Notes (Signed)
 5 Days Post-Op Procedure(s) (LRB): MITRAL VALVE (MV) REPLACEMENT USING [employer] MA[employer]ERS MECHANICAL VALVE (N/A) TRANSESOPHAGEAL ECHOCARDIOGRAM (TEE) (N/A) Subjective: No c/o  Objective: Vital signs in last 24 hours: Temp:  [97.4 F (36.3 C)-98 F (36.7 C)] 97.4 F (36.3 C) (02/09 1645) Pulse Rate:  [73-94] 80 (02/10 0700) Cardiac Rhythm: Atrial fibrillation (02/09 2000) Resp:  [8-28] 21 (02/10 0700) BP: (93-128)/(48-87) 98/66 (02/10 0700) SpO2:  [92 %-100 %] 100 % (02/10 0700) Weight:  [49.1 kg] 49.1 kg (02/10 0600)  Hemodynamic parameters for last 24 hours:    Intake/Output from previous day: 02/09 0701 - 02/10 0700 In: 560 [P.O.:560] Out: 2180 [Urine:2180] Intake/Output this shift: No intake/output data recorded.  General appearance: alert, cooperative, fatigued, and no distress Heart: irregularly irregular rhythm Lungs: dim in lower fields Abdomen: soft, non tender Extremities: no edema Wound: incis healing well  Lab Results: Recent Labs    12/03/23 0422 12/04/23 0346  WBC 8.8 8.5  HGB 8.5* 8.6*  HCT 26.2* 26.4*  PLT 96* 138*   BMET:  Recent Labs    12/03/23 0422 12/04/23 0346  NA 136 135  K 3.7 5.2*  CL 102 94*  CO2 28 31  GLUCOSE 155* 97  BUN 28* 19  CREATININE 0.95 0.84  CALCIUM  8.4* 9.5    PT/INR:  Recent Labs    12/04/23 0346  LABPROT 18.9*  INR 1.6*   ABG    Component Value Date/Time   PHART 7.346 (L) 11/29/2023 1756   HCO3 23.2 11/29/2023 1756   TCO2 25 11/29/2023 1756   ACIDBASEDEF 2.0 11/29/2023 1756   O2SAT 94 11/29/2023 1756   CBG (last 3)  Recent Labs    12/03/23 1644 12/03/23 2253 12/04/23 0728  GLUCAP 133* 144* 98    Meds Scheduled Meds:  acetaminophen   1,000 mg Oral Q6H   Or   acetaminophen  (TYLENOL ) oral liquid 160 mg/5 mL  1,000 mg Per Tube Q6H   aspirin   81 mg Oral Daily   atorvastatin   80 mg Oral Daily   bisacodyl   10 mg Oral Daily   Or   bisacodyl   10 mg Rectal Daily   Chlorhexidine  Gluconate  Cloth  6 each Topical Daily   docusate sodium   200 mg Oral Daily   Fe Fum-Vit C-Vit B12-FA  1 capsule Oral QPC breakfast   furosemide   40 mg Intravenous BID   gabapentin   100 mg Oral QHS   insulin  aspart  0-24 Units Subcutaneous TID WC   ipratropium-albuterol   3 mL Nebulization BID   levETIRAcetam   250 mg Oral BID   levothyroxine   50 mcg Oral QAC breakfast   pantoprazole   40 mg Oral Daily   warfarin  5 mg Oral ONCE-1600   Warfarin - Pharmacist Dosing Inpatient   Does not apply q1600   Continuous Infusions: PRN Meds:.dextrose , ipratropium-albuterol , metoprolol  tartrate, ondansetron  (ZOFRAN ) IV, mouth rinse, oxyCODONE , traMADol   Xrays DG CHEST PORT 1 VIEW Result Date: 12/03/2023 CLINICAL DATA:  Status post mitral valve replacement. EXAM: PORTABLE CHEST 1 VIEW COMPARISON:  12/01/2023 FINDINGS: The cardio pericardial silhouette is enlarged. Bibasilar atelectasis with small bilateral pleural effusions. Mediastinal/pericardial drains have been removed in the interval. The right IJ sheath remains in place. Telemetry leads overlie the chest. IMPRESSION: Bibasilar atelectasis with small bilateral pleural effusions. Electronically Signed   By: Donnal Fusi M.D.   On: 12/03/2023 09:54    Assessment/Plan: S/P Procedure(s) (LRB): MITRAL VALVE (MV) REPLACEMENT USING ST JUDE MASTERS MECHANICAL VALVE (N/A) TRANSESOPHAGEAL ECHOCARDIOGRAM (TEE) (  N/A) POD#5  1 afeb,  afib w/CVR, s BP 90's-120's 2 O2 sats ok on 2 liters 3 diuresing well, weight about 1 KG >preop, will cont lasix  but reduce dose ,  and d/c foley 4 normal renal fxn 5 K+ 5.2,received supplement yesterday,  cont to follow closely 6 expected ABLA remains stable 7 improving thrombocytopenia, PLT now 138K 9 INR 1.6- will give 5 of coumadin  per MD instructions 10 BS adeq control, likely resume glucophage  at discharge 11 CXR bibasilar atx, ASD 12 cont rehab and pulm hygiene    LOS: 5 days    Jurline Folger E Cornell Gaber 12/04/2023

## 2023-12-04 NOTE — Progress Notes (Signed)
 Patient brought to 4E from 2H. VSS. Telemetry box applied, CCMD notified. Patient oriented to room and staff. Call bell in reach. ? ?Kenard Gower, RN  ?

## 2023-12-04 NOTE — TOC Progression Note (Signed)
 Transition of Care [facility]) - Progression Note    Patient Details  Name: Christy Poole MRN: [id number] Date of Birth: 07-07-1961  [facility]) CM/SW Contact  Graves-Bigelow, Jari Merles, RN Phone Number: 12/04/2023, 11:18 AM  Clinical Narrative:  Patient POD-5 MVR-continues on IV Lasix .. Case Manager continues to follow for transition of care needs as the patient progresses. [facility] is following per office protocol.       Expected Discharge Plan: Home w Home Health Services Barriers to Discharge: Continued Medical Work up  Expected Discharge Plan and Services   Discharge Planning Services: CM Consult Post Acute Care Choice: Home Health Living arrangements for the past 2 months: Single Family Home                   DME Agency: NA   Social Determinants of Health (SDOH) Interventions SDOH Screenings   Food Insecurity: Patient Declined (10/12/2023)  Recent Concern: Food Insecurity - Food Insecurity Present (08/30/2023)  Housing: Unknown (10/12/2023)  Recent Concern: Housing - Medium Risk (09/14/2023)  Transportation Needs: Patient Declined (10/12/2023)  Recent Concern: Transportation Needs - Unmet Transportation Needs (08/30/2023)  Utilities: Not At Risk (09/14/2023)  Alcohol Screen: Medium Risk (10/12/2023)  Depression (PHQ2-9): Low Risk  (11/06/2023)  Recent Concern: Depression (PHQ2-9) - Medium Risk (10/12/2023)  Financial Resource Strain: Patient Declined (10/12/2023)  Recent Concern: Financial Resource Strain - High Risk (08/30/2023)  Physical Activity: Inactive (08/30/2023)  Social Connections: Socially Isolated (08/30/2023)  Stress: Stress Concern Present (08/30/2023)  Tobacco Use: Low Risk  (11/29/2023)    Readmission Risk Interventions    09/13/2023    9:20 AM  Readmission Risk Prevention Plan  Transportation Screening Complete  PCP or Specialist Appt within 5-7 Days Complete  Home Care Screening Complete  Medication Review (RN CM) Complete

## 2023-12-04 NOTE — Progress Notes (Signed)
 PHARMACY - ANTICOAGULATION CONSULT NOTE  Pharmacy Consult for warfarin Indication:  mechanical MVR  Allergies  Allergen Reactions   Other Other (See Comments)    Patient is vegetarian no meat can eat eggs    Patient Measurements: Height: 4\' 11"  (149.9 cm) Weight: 49.1 kg (108 lb 3.9 oz) IBW/kg (Calculated) : 43.2   Vital Signs: BP: 98/66 (02/10 0700) Pulse Rate: 80 (02/10 0700)  Labs: Recent Labs    12/02/23 0223 12/03/23 0422 12/04/23 0346  HGB 9.2* 8.5* 8.6*  HCT 27.8* 26.2* 26.4*  PLT 73* 96* 138*  LABPROT 19.2* 19.2* 18.9*  INR 1.6* 1.6* 1.6*  CREATININE 1.23* 0.95 0.84    Estimated Creatinine Clearance: 46.1 mL/min (by C-G formula based on SCr of 0.84 mg/dL).   Medical History: Past Medical History:  Diagnosis Date   Atrial fibrillation (HCC)    Coronary artery disease    s/p LAD stents   Diabetes mellitus without complication (HCC)    Dyspnea    Headache    HLD (hyperlipidemia)    Hypertension    Memory loss    due to stroke in 2024   Neuromuscular disorder (HCC)    feet   Pneumonia    x 1   Seizure (HCC)    last one was in 09/2023   Stroke [facility]) 2019   Thyroid disease    Walker as ambulation aid       Assessment: Christy Poole with Hx Afib on warfarin PTA 3.75mg  every day except 2.5mg  on Sun Post-op pt still not eating much, nausea is improving - on zofran   Start warfarin therapy 2/7, using low dose and titrate slowly  INR remains subtherapeutic at 1.6 after warfarin 2 mg - dose lower thatn PTA as patient not eating very much post op CBC is stable. Pt with no signs of bleeding. Per MD increase dose today and then tomorrow - back down to home dose   Goal of Therapy:  INR 2.5-3.5 Monitor platelets by anticoagulation protocol: Yes   Plan:  Warfarin 5mg  x1  Daily protime and CBC Monitor s/s bleeding    Hortensia Ma Pharm.D. CPP, BCPS Clinical Pharmacist [phone] 12/04/2023 8:23 AM

## 2023-12-04 NOTE — Progress Notes (Signed)
 NAME:  Christy Poole, MRN:  [id number], DOB:  1960/11/06, LOS: 5 ADMISSION DATE:  11/29/2023, CONSULTATION DATE: 11/28/2022 REFERRING MD: Melene Sportsman, CHIEF COMPLAINT: Status post mitral valve replacement  History of Present Illness:  63 year old female with chronic A-fib, diabetes type 2, coronary artery disease status post stent and rheumatic mitral valve stenosis who started with dyspnea on exertion, today she underwent mechanical mitral valve replacement and closure of left atrial appendage, remained intubated and was transferred to ICU.  PCCM was consulted for help evaluation medical management  Pertinent  Medical History   Past Medical History:  Diagnosis Date   Atrial fibrillation (HCC)    Coronary artery disease    s/p LAD stents   Diabetes mellitus without complication (HCC)    Dyspnea    Headache    HLD (hyperlipidemia)    Hypertension    Memory loss    due to stroke in 2024   Neuromuscular disorder (HCC)    feet   Pneumonia    x 1   Seizure (HCC)    last one was in 09/2023   Stroke [facility]) 2019   Thyroid disease    Walker as ambulation aid      Significant Hospital Events: Including procedures, antibiotic start and stop dates in addition to other pertinent events   2/6 MVR for rheumatic MS 2/7 1 unit pRBC 2/8 pacing wires out  Interim History / Subjective:  No acute events overnight  Objective   Blood pressure 98/66, pulse 80, temperature (!) 97.4 F (36.3 C), temperature source Oral, resp. rate (!) 21, height 4\' 11"  (1.499 m), weight 49.1 kg, SpO2 100%.        Intake/Output Summary (Last 24 hours) at 12/04/2023 4098 Last data filed at 12/04/2023 0800 Gross per 24 hour  Intake 440 ml  Output 2240 ml  Net -1800 ml   Filed Weights   12/02/23 0500 12/03/23 0600 12/04/23 0600  Weight: 49.8 kg 51.2 kg 49.1 kg    Examination: General:  frail appearing elderly woman sitting up in the chair in NAD HEENT:  [place]/AT, eyes anicteric Neuro: awake, alert, moving  all extremities Chest: sternotomy incision healing well, breathing comfortably on [place], basilar rhales.  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 D/c tramadol  due to seizure history. 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, improving -no current indication for transfusion  Hyperkalemia-- suspect this is a hemolyzed specimen with her improving renal function and diuresis -recheck tomorrow  H.o seizures -con't keppra  -d/c tramadol  -con't PTA gabapentin   H/o hypothyroidism -synthroid   Stable to transfer tot he floor today  Best Practice (right click and "Reselect all SmartList Selections" daily)   Diet/type: Regular consistency DVT prophylaxis: SCD,  titrating up warfarin GI prophylaxis: PPI Lines: n/a Foley:  d/c today Code Status:  full code Last date of multidisciplinary goals of care discussion [Per primary team]   Labs   CBC: Recent Labs  Lab 11/30/23 0259 12/01/23 0423 12/02/23 0223 12/03/23 0422 12/04/23 0346  WBC 11.4* 15.0* 11.7* 8.8 8.5  HGB 8.2* 7.1* 9.2* 8.5* 8.6*  HCT  25.5* 21.9* 27.8* 26.2* 26.4*  MCV 88.2 89.4 90.0 91.6 91.0  PLT 60* 71* 73* 96* 138*    Basic Metabolic Panel: Recent Labs  Lab 11/29/23 1755 11/29/23 1756 11/30/23 0259 12/01/23 0423 12/02/23 0223 12/03/23 0422 12/04/23 0346  NA 140   < > 137 133* 134* 136 135  K 4.1   < > 4.1 4.1 3.9 3.7 5.2*  CL 106  --  107 101 102 102 94*  CO2 24  --  22 25 24 28 31   GLUCOSE 114*  --  122* 119* 119* 155* 97  BUN 8  --  9 18 26* 28* 19  CREATININE 0.71  --  0.77 1.27* 1.23* 0.95 0.84  CALCIUM  8.1*  --  8.2* 8.4* 8.2* 8.4* 9.5  MG 3.7*  --  2.8*  --   --   --   --    < > = values in this interval not displayed.   GFR: Estimated Creatinine Clearance: 46.1 mL/min (by C-G formula based on SCr of 0.84 mg/dL). Recent Labs  Lab 12/01/23 0423 12/02/23 0223 12/03/23 0422 12/04/23 0346  WBC 15.0* 11.7* 8.8 8.5    Liver Function Tests: Recent Labs  Lab 11/27/23 1155  AST 36  ALT 30  ALKPHOS 64  BILITOT 1.3*  PROT 7.1  ALBUMIN  3.9    Joesph Mussel, DO 12/04/23 8:32 AM  Pulmonary & Critical Care  For contact information, see Amion. If no response to pager, please call PCCM consult pager. After hours, 7PM- 7AM, please call Elink.

## 2023-12-04 NOTE — Evaluation (Signed)
 Occupational Therapy Evaluation Patient Details Name: Christy Poole MRN: [id number] DOB: 07-Mar-1961 Today's Date: 12/04/2023   History of Present Illness Pt is 63 yo presenting to [facility] on 11/28/23 for scheduled surgery. Pt currently s/p mitral valve replacement on 11/29/23. PMH: A-fib, DM, stroe, thyroid disease.    Clinical Impression   Pt typically ambulates with a RW and is assisted in and out of the tub (stands to shower), with dressing and all IADLs. She lives with her son and his family, son can provide 24 hour care at home initially. Their home is two levels with half bath on the first floor, pt must go upstairs to sleep and bathe. Pt presents with mild post operative pain, generalized weakness, decreased activity tolerance and impaired standing balance. She needs min assist and verbal cues for bed mobility and min assist to stand with heart pillow and to side step along EOB with RW. Recommending tub transfer bench for home and HHOT.       If plan is discharge home, recommend the following: A little help with walking and/or transfers;A lot of help with bathing/dressing/bathroom;Assistance with cooking/housework;Assist for transportation;Help with stairs or ramp for entrance;Direct supervision/assist for medications management;Direct supervision/assist for financial management    Functional Status Assessment  Patient has had a recent decline in their functional status and demonstrates the ability to make significant improvements in function in a reasonable and predictable amount of time.  Equipment Recommendations  Tub/shower bench    Recommendations for Other Services       Precautions / Restrictions Precautions Precautions: Fall;Sternal Precaution Comments: reviewed precautions throughout Restrictions Weight Bearing Restrictions Per Provider Order: No      Mobility Bed Mobility Overal bed mobility: Needs Assistance Bed Mobility: Rolling, Sidelying to Sit, Sit to  Sidelying Rolling: Min assist Sidelying to sit: Min assist     Sit to sidelying: Mod assist General bed mobility comments: cues for technique, pt held heart pillow, assisted to roll with use of pad, raise trunk and pivot hips to EOB, assisted LEs back into bed    Transfers Overall transfer level: Needs assistance Equipment used: Rolling walker (2 wheels) Transfers: Sit to/from Stand Sit to Stand: Min assist           General transfer comment: light min assist from EOB holding heart pillow      Balance Overall balance assessment: Needs assistance   Sitting balance-Leahy Scale: Good     Standing balance support: Bilateral upper extremity supported, Reliant on assistive device for balance, During functional activity Standing balance-Leahy Scale: Poor                             ADL either performed or assessed with clinical judgement   ADL Overall ADL's : Needs assistance/impaired Eating/Feeding: Set up;Bed level   Grooming: Contact guard assist;Sitting   Upper Body Bathing: Minimal assistance;Sitting   Lower Body Bathing: Maximal assistance;Sit to/from stand   Upper Body Dressing : Minimal assistance;Sitting   Lower Body Dressing: Maximal assistance;Sit to/from stand   Toilet Transfer: Minimal assistance   Toileting- Clothing Manipulation and Hygiene: Maximal assistance;Sit to/from stand               Vision Ability to See in Adequate Light: 0 Adequate Patient Visual Report: No change from baseline       Perception         Praxis         Pertinent Vitals/Pain Pain Assessment  Pain Assessment: Faces Faces Pain Scale: Hurts a little bit Pain Location: sternum/surgica site Pain Descriptors / Indicators: Discomfort, Grimacing, Guarding Pain Intervention(s): Repositioned     Extremity/Trunk Assessment Upper Extremity Assessment Upper Extremity Assessment: Generalized weakness;Right hand dominant   Lower Extremity Assessment Lower  Extremity Assessment: Defer to PT evaluation   Cervical / Trunk Assessment Cervical / Trunk Assessment: Other exceptions (weakness)   Communication Communication Cueing Techniques: Verbal cues;Gestural cues   Cognition Arousal: Alert Behavior During Therapy: WFL for tasks assessed/performed, Anxious Overall Cognitive Status: History of cognitive impairments - at baseline                                 General Comments: son interpreting     General Comments       Exercises     Shoulder Instructions      Home Living Family/patient expects to be discharged to:: Private residence Living Arrangements: Children (son, daughter in law and 32 year old granddaughter) Available Help at Discharge: Family;Available 24 hours/day (son typically works, but can be home with pt initially, daughter in law also works) Type of Home: House Home Access: Stairs to enter Secretary/administrator of Steps: 4 Entrance Stairs-Rails: Right Home Layout: Two level;1/2 bath on main level Alternate Level Stairs-Number of Steps: flight Alternate Level Stairs-Rails: Right Bathroom Shower/Tub: Chief Strategy Officer: Standard     Home Equipment: Agricultural consultant (2 wheels)          Prior Functioning/Environment Prior Level of Function : Needs assist             Mobility Comments: uses RW in home. ADLs Comments: Pt needs help with ADLs, stands with assist for showering.        OT Problem List: Decreased strength;Decreased activity tolerance;Impaired balance (sitting and/or standing);Decreased knowledge of use of DME or AE;Cardiopulmonary status limiting activity;Pain;Impaired UE functional use      OT Treatment/Interventions: Self-care/ADL training;Energy conservation;Therapeutic activities;Patient/family education;Balance training    OT Goals(Current goals can be found in the care plan section) Acute Rehab OT Goals OT Goal Formulation: With patient/family Time For  Goal Achievement: 12/18/23 Potential to Achieve Goals: Good ADL Goals Pt Will Perform Grooming: with contact guard assist;standing Pt Will Perform Lower Body Bathing: with min assist;sit to/from stand Pt Will Perform Lower Body Dressing: with min assist;sit to/from stand Pt Will Transfer to Toilet: with contact guard assist;ambulating;regular height toilet Pt Will Perform Toileting - Clothing Manipulation and hygiene: with min assist;sit to/from stand Additional ADL Goal #1: Pt will complete bed mobility with CGA in preparation for ADLs. Additional ADL Goal #2: Pt and son will be knowledgeable in sternal precautions during mobility and ADLs.  OT Frequency: Min 1X/week    Co-evaluation              AM-PAC OT "6 Clicks" Daily Activity     Outcome Measure Help from another person eating meals?: None Help from another person taking care of personal grooming?: A Little Help from another person toileting, which includes using toliet, bedpan, or urinal?: A Lot Help from another person bathing (including washing, rinsing, drying)?: A Lot Help from another person to put on and taking off regular upper body clothing?: A Little Help from another person to put on and taking off regular lower body clothing?: A Lot 6 Click Score: 16   End of Session Equipment Utilized During Treatment: Rolling walker (2 wheels);Gait belt Nurse Communication:  Mobility status;Other (comment) (changed O2 sensor)  Activity Tolerance: Patient tolerated treatment well (SPo2 93% on RA) Patient left: in bed;with call bell/phone within reach;with bed alarm set;with family/visitor present  OT Visit Diagnosis: Unsteadiness on feet (R26.81);Muscle weakness (generalized) (M62.81);Other symptoms and signs involving cognitive function                Time: 6045-4098 OT Time Calculation (min): 30 min Charges:  OT General Charges $OT Visit: 1 Visit OT Evaluation $OT Eval Moderate Complexity: 1 Mod OT Treatments $Self  Care/Home Management : 8-22 mins Avanell Leigh, OTR/L Acute Rehabilitation Services Office: [phone]  Jonette Nestle 12/04/2023, 12:27 PM

## 2023-12-05 ENCOUNTER — Ambulatory Visit: Payer: Medicaid Other

## 2023-12-05 LAB — GLUCOSE, CAPILLARY
Glucose-Capillary: 108 mg/dL — ABNORMAL HIGH (ref 70–99)
Glucose-Capillary: 172 mg/dL — ABNORMAL HIGH (ref 70–99)
Glucose-Capillary: 92 mg/dL (ref 70–99)
Glucose-Capillary: 138 mg/dL — ABNORMAL HIGH (ref 70–99)

## 2023-12-05 LAB — ECHO INTRAOPERATIVE TEE
AR max vel: 1.95 cm2
AV Area VTI: 1.68 cm2
AV Area mean vel: 1.87 cm2
AV Mean grad: 2 mm[Hg]
AV Peak grad: 3.9 mm[Hg]
Ao pk vel: 0.99 m/s
Height: 59 in
MV VTI: 1.11 cm2
Weight: 1693.13 [oz_av]

## 2023-12-05 LAB — BASIC METABOLIC PANEL
Anion gap: 14 (ref 5–15)
BUN: 17 mg/dL (ref 8–23)
CO2: 28 mmol/L (ref 22–32)
Calcium: 9.6 mg/dL (ref 8.9–10.3)
Chloride: 89 mmol/L — ABNORMAL LOW (ref 98–111)
Creatinine, Ser: 0.71 mg/dL (ref 0.44–1.00)
GFR, Estimated: >60 mL/min (ref >60)
Glucose, Bld: 115 mg/dL — ABNORMAL HIGH (ref 70–99)
Potassium: 4.1 mmol/L (ref 3.5–5.1)
Sodium: 131 mmol/L — ABNORMAL LOW (ref 135–145)

## 2023-12-05 LAB — CBC
HCT: 33.5 % — ABNORMAL LOW (ref 36.0–46.0)
Hemoglobin: 11 g/dL — ABNORMAL LOW (ref 12.0–15.0)
MCH: 29.5 pg (ref 26.0–34.0)
MCHC: 32.8 g/dL (ref 30.0–36.0)
MCV: 89.8 fL (ref 80.0–100.0)
Platelets: 170 10*3/uL (ref 150–400)
RBC: 3.73 MIL/uL — ABNORMAL LOW (ref 3.87–5.11)
RDW: 14.8 % (ref 11.5–15.5)
WBC: 11.9 10*3/uL — ABNORMAL HIGH (ref 4.0–10.5)
nRBC: 0.3 % — ABNORMAL HIGH (ref 0.0–0.2)

## 2023-12-05 LAB — PROTIME-INR
INR: 1.5 — ABNORMAL HIGH (ref 0.8–1.2)
Prothrombin Time: 18.6 s — ABNORMAL HIGH (ref 11.4–15.2)

## 2023-12-05 MED ORDER — WARFARIN SODIUM 5 MG PO TABS
7.5000 mg | ORAL_TABLET | Freq: Once | ORAL | Status: AC
  Administered 2023-12-05: 7.5 mg via ORAL
  Filled 2023-12-05: qty 1

## 2023-12-05 MED ORDER — FUROSEMIDE 40 MG PO TABS: 40.0000 mg | ORAL_TABLET | Freq: Every day | ORAL | Status: DC

## 2023-12-05 MED ORDER — GUAIFENESIN-DM 100-10 MG/5ML PO SYRP: 5.0000 mL | ORAL_SOLUTION | ORAL | Status: DC | PRN

## 2023-12-05 NOTE — Progress Notes (Signed)
6 Days Post-Op Procedure(s) (LRB): MITRAL VALVE (MV) REPLACEMENT USING [employer] MA[employer]ERS MECHANICAL VALVE (N/A) TRANSESOPHAGEAL ECHOCARDIOGRAM (TEE) (N/A) Subjective: Some cough, some nausea  Objective: Vital signs in last 24 hours: Temp:  [97.5 F (36.4 C)-98.5 F (36.9 C)] 98.2 F (36.8 C) (02/11 0355) Pulse Rate:  [83-95] 95 (02/10 2030) Cardiac Rhythm: Atrial fibrillation (02/10 1930) Resp:  [12-25] 20 (02/11 0355) BP: (113-148)/(59-112) 117/59 (02/11 0355) SpO2:  [91 %-100 %] 99 % (02/11 0355) Weight:  [48.1 kg] 48.1 kg (02/11 0500)  Hemodynamic parameters for last 24 hours:    Intake/Output from previous day: 02/10 0701 - 02/11 0700 In: 720 [P.O.:720] Out: 1400 [Urine:1400] Intake/Output this shift: No intake/output data recorded.  General appearance: alert, cooperative, and no distress Heart: irregularly irregular rhythm Lungs: coarse BS Abdomen: benign Extremities: no edema Wound: incis healing well  Lab Results: Recent Labs    12/04/23 0346 12/05/23 0313  WBC 8.5 11.9*  HGB 8.6* 11.0*  HCT 26.4* 33.5*  PLT 138* 170   BMET:  Recent Labs    12/04/23 0346 12/05/23 0313  NA 135 131*  K 5.2* 4.1  CL 94* 89*  CO2 31 28  GLUCOSE 97 115*  BUN 19 17  CREATININE 0.84 0.71  CALCIUM 9.5 9.6    PT/INR:  Recent Labs    12/05/23 0313  LABPROT 18.6*  INR 1.5*   ABG    Component Value Date/Time   PHART 7.346 (L) 11/29/2023 1756   HCO3 23.2 11/29/2023 1756   TCO2 25 11/29/2023 1756   ACIDBASEDEF 2.0 11/29/2023 1756   O2SAT 94 11/29/2023 1756   CBG (last 3)  Recent Labs    12/04/23 1548 12/04/23 2151 12/05/23 0604  GLUCAP 165* 113* 108*    Meds Scheduled Meds:  aspirin  81 mg Oral Daily   atorvastatin  80 mg Oral Daily   bisacodyl  10 mg Oral Daily   Or   bisacodyl  10 mg Rectal Daily   Chlorhexidine Gluconate Cloth  6 each Topical Daily   docusate sodium  200 mg Oral Daily   Fe Fum-Vit C-Vit B12-FA  1 capsule Oral QPC breakfast    furosemide  40 mg Oral Daily   gabapentin  100 mg Oral QHS   insulin aspart  0-24 Units Subcutaneous TID WC   levETIRAcetam  250 mg Oral BID   levothyroxine  50 mcg Oral QAC breakfast   pantoprazole  40 mg Oral Daily   warfarin  7.5 mg Oral ONCE-1600   Warfarin - Pharmacist Dosing Inpatient   Does not apply q1600   Continuous Infusions: PRN Meds:.acetaminophen, dextrose, ipratropium-albuterol, metoprolol tartrate, ondansetron (ZOFRAN) IV, mouth rinse, oxyCODONE  Xrays DG CHEST PORT 1 VIEW Result Date: 12/04/2023 CLINICAL DATA:  Postop mitral valve replacement. EXAM: PORTABLE CHEST 1 VIEW COMPARISON:  Radiographs 12/03/2023 and 12/01/2023.  CT 09/09/2023. FINDINGS: 0512 hours. Right IJ sheath has been removed in the interval. The heart size and mediastinal contours are stable status post median sternotomy and mitral valve replacement. No significant change in bilateral pleural effusions, bibasilar atelectasis and mild pulmonary edema. No evidence of pneumothorax. The bones appear unchanged. IMPRESSION: Interval removal of right IJ sheath. No other significant change. Electronically Signed   By: Carey Bullocks M.D.   On: 12/04/2023 10:03    Assessment/Plan: S/P Procedure(s) (LRB): MITRAL VALVE (MV) REPLACEMENT USING ST JUDE MASTERS MECHANICAL VALVE (N/A) TRANSESOPHAGEAL ECHOCARDIOGRAM (TEE) (N/A) POD#6  1 afeb, S BP 110's-140's, afib w/CVR 2 O2 sats good  on RA 3 good UOP, not all measured, weight at preop 4 BS adeq controlled, resume glucophage at d/c 5 normal renal fxn, some hypochloremia/hyponatremia- will d/c lasix soon 6 minor leukocytosis 7 H/H improved trend, 11/33 8 thrombocytopenia resolved 9 INR 1.5, lower, was given 5mg  of coumadin yesterday, 7.5 ordered today 10 cont nebs, pulm hygiene and rehab- add robitussin, early bronchitis   LOS: 6 days    Rowe Clack PA-C Pager [phone]  12/05/2023

## 2023-12-05 NOTE — Patient Instructions (Incomplete)
Discharge Instructions:  1. You may shower, please wash incisions daily with soap and water and keep dry.  If you wish to cover wounds with dressing you may do so but please keep clean and change daily.  No tub baths or swimming until incisions have completely healed.  If your incisions become red or develop any drainage please call our office at [phone]  2. No Driving until cleared by Dr. Karolee Ohs office and you are no longer using narcotic pain medications  3. Monitor your weight daily.. Please use the same scale and weigh at same time... If you gain 5-10 lbs in 48 hours with associated lower extremity swelling, please contact our office at [phone]  4. Fever of 101.5 for at least 24 hours with no source, please contact our office at [phone]  5. Activity- up as tolerated, please walk at least 3 times per day.  Avoid strenuous activity, no lifting, pushing, or pulling with your arms over 8-10 lbs for a minimum of 6 weeks  6. If any questions or concerns arise, please do not hesitate to contact our office at [phone]

## 2023-12-05 NOTE — Progress Notes (Signed)
CARDIAC REHAB PHASE I   Pt about to go to bathroom with nursing student. Encouraged mobility and IS use today. Will continue to follow.   [id number]  Woodroe Chen, RN BSN 12/05/2023 12:16 PM

## 2023-12-05 NOTE — Progress Notes (Deleted)
 [address]       [place] [address]             [phone]       HPI: Patient returns for 1 week postoperative follow-up having undergone MV Replacement with mechanical valve on 11/29/2023. The patient's early postoperative recovery while in the hospital was notable for anemia requiring blood transfusion, Atrial Fibrillation with conversion to NSR, and some episodic bradycardia.. Since hospital discharge the patient reports ***.   No current facility-administered medications for this visit.   No current outpatient medications on file.   Facility-Administered Medications Ordered in Other Visits  Medication Dose Route Frequency Provider Last Rate Last Admin   acetaminophen (TYLENOL) tablet 650 mg  650 mg Oral Q6H PRN Eugenio Hoes, MD       aspirin chewable tablet 81 mg  81 mg Oral Daily Eugenio Hoes, MD   81 mg at 12/05/23 0931   atorvastatin (LIPITOR) tablet 80 mg  80 mg Oral Daily Leary Roca, PA-C   80 mg at 12/05/23 0930   bisacodyl (DULCOLAX) EC tablet 10 mg  10 mg Oral Daily Roddenberry, Myron G, PA-C   10 mg at 12/02/23 3295   Or   bisacodyl (DULCOLAX) suppository 10 mg  10 mg Rectal Daily Roddenberry, Myron G, PA-C       dextrose 50 % solution 0-50 mL  0-50 mL Intravenous PRN Roddenberry, Myron G, PA-C       docusate sodium (COLACE) capsule 200 mg  200 mg Oral Daily Roddenberry, Myron G, PA-C   200 mg at 12/05/23 0931   Fe Fum-Vit C-Vit B12-FA (TRIGELS-F FORTE) capsule 1 capsule  1 capsule Oral QPC breakfast Alleen Borne, MD   1 capsule at 12/05/23 0930   gabapentin (NEURONTIN) capsule 100 mg  100 mg Oral QHS Roddenberry, Myron G, PA-C   100 mg at 12/04/23 2250   guaiFENesin-dextromethorphan (ROBITUSSIN DM) 100-10 MG/5ML syrup 5 mL  5 mL Oral Q4H PRN Gold, Wayne E, PA-C       insulin aspart (novoLOG) injection 0-24 Units  0-24 Units Subcutaneous TID WC Alleen Borne, MD   4 Units at 12/04/23 1643   ipratropium-albuterol (DUONEB) 0.5-2.5  (3) MG/3ML nebulizer solution 3 mL  3 mL Nebulization Q4H PRN Lidia Collum, PA-C       levETIRAcetam (KEPPRA) tablet 250 mg  250 mg Oral BID Roddenberry, Myron G, PA-C   250 mg at 12/05/23 1884   levothyroxine (SYNTHROID) tablet 50 mcg  50 mcg Oral QAC breakfast Leary Roca, PA-C   50 mcg at 12/05/23 1660   metoprolol tartrate (LOPRESSOR) injection 2.5-5 mg  2.5-5 mg Intravenous Q2H PRN Roddenberry, Myron G, PA-C       ondansetron [facility]) injection 4 mg  4 mg Intravenous Q6H PRN Roddenberry, Myron G, PA-C   4 mg at 12/03/23 1707   Oral care mouth rinse  15 mL Mouth Rinse PRN Eugenio Hoes, MD       oxyCODONE (Oxy IR/ROXICODONE) immediate release tablet 5-10 mg  5-10 mg Oral Q3H PRN Roddenberry, Myron G, PA-C   5 mg at 12/05/23 0931   pantoprazole (PROTONIX) EC tablet 40 mg  40 mg Oral Daily Roddenberry, Myron G, PA-C   40 mg at 12/05/23 6301   warfarin (COUMADIN) tablet 7.5 mg  7.5 mg Oral ONCE-1600 Eugenio Hoes, MD       Warfarin - Pharmacist Dosing Inpatient   Does not apply q1600 Weldner,  Renae Fickle, MD   1 each at 12/04/23 1644    Physical Exam: ***  Diagnostic Tests: ***  A/P;  S/P Mechanical MVR INR  Atrial Fibrillation H/O Seizure disorder-on Kepra HLD-Lipitor Hypothyroidism- on Synthroid  RTC in 3 weeks with Dr. Dara Lords, PA-C [facility] [phone]

## 2023-12-05 NOTE — Progress Notes (Signed)
Physical Therapy Treatment Patient Details Name: Christy Poole MRN: [id number] DOB: [birth year] Today's Date: 12/05/2023   History of Present Illness Pt is 63 yo presenting to [facility]/28/23 for scheduled surgery. Pt currently s/p mitral vave replacement on 11/29/23. PMH: A-fib, DM, stroe, thyroid disease. [id number]    PT Comments  Patient alone in room. No ipad on unit for translation. RN reports she tried google translate, but was not effective for communication. Patient responds well to gestures and was able to ambulate out in hallway with single hand held assist. She will continue to benefit from skilled PT to improve safety, strength and independence.      If plan is discharge home, recommend the following: A little help with walking and/or transfers;A little help with bathing/dressing/bathroom;Assist for transportation;Help with stairs or ramp for entrance;Assistance with cooking/housework   Can travel by private vehicle      yes  Equipment Recommendations  None recommended by PT    Recommendations for Other Services       Precautions / Restrictions Precautions Precautions: Sternal;Fall Precaution/Restrictions Comments: reviewed precautions throughout Restrictions Weight Bearing Restrictions Per Provider Order: Yes Other Position/Activity Restrictions: Sternal precautions     Mobility  Bed Mobility Overal bed mobility: Modified Independent Bed Mobility: Supine to Sit, Sit to Supine     Supine to sit: Min assist Sit to supine: Modified independent (Device/Increase time)   General bed mobility comments: Reaches out for hand to assist scooting out of bed.    Transfers Overall transfer level: Needs assistance Equipment used: 1 person hand held assist Transfers: Sit to/from Stand Sit to Stand: Min assist                Ambulation/Gait Ambulation/Gait assistance: Min assist Gait Distance (Feet): 80 Feet Assistive device: 1 person hand held assist Gait  Pattern/deviations: Step-through pattern, Decreased step length - right, Decreased step length - left, Decreased stride length, Shuffle Gait velocity: slow followed by quick steps     General Gait Details: Patient ambulates with slightly erratic pace, hand held A for balance. Occasional quick steps, and forward leaning posture.   Stairs             Wheelchair Mobility     Tilt Bed    Modified Rankin (Stroke Patients Only)       Balance Overall balance assessment: Needs assistance Sitting-balance support: Feet supported Sitting balance-Leahy Scale: Good     Standing balance support: Single extremity supported, During functional activity Standing balance-Leahy Scale: Fair                              Hotel manager: No apparent difficulties Factors Affecting Communication: Non - English speaking, interpreter not available  Cognition Arousal: Alert Behavior During Therapy: WFL for tasks assessed/performed   PT - Cognitive impairments: Difficult to assess Difficult to assess due to: Non-English speaking                     PT - Cognition Comments: No i pad available to use. No family present. RN states she tried google translate, but must not have been the correct dialect as patient did not respond to it. Following commands: Intact      Cueing Cueing Techniques: Gestural cues  Exercises      General Comments        Pertinent Vitals/Pain Pain Assessment Pain Assessment: Faces Faces Pain Scale: No hurt Facial Expression: Relaxed,  neutral Body Movements: Absence of movements Muscle Tension: Relaxed    Home Living                          Prior Function            PT Goals (current goals can now be found in the care plan section) Acute Rehab PT Goals Patient Stated Goal: to return home with family PT Goal Formulation: With patient Time For Goal Achievement: 12/17/23 Potential to Achieve  Goals: Good Progress towards PT goals: Progressing toward goals    Frequency    Min 1X/week      PT Plan      Co-evaluation              AM-PAC PT "6 Clicks" Mobility   Outcome Measure  Help needed turning from your back to your side while in a flat bed without using bedrails?: A Little Help needed moving from lying on your back to sitting on the side of a flat bed without using bedrails?: A Little Help needed moving to and from a bed to a chair (including a wheelchair)?: A Little Help needed standing up from a chair using your arms (e.g., wheelchair or bedside chair)?: A Little Help needed to walk in hospital room?: A Little Help needed climbing 3-5 steps with a railing? : A Lot 6 Click Score: 17    End of Session   Activity Tolerance: Patient tolerated treatment well Patient left: in bed;with call bell/phone within reach;with bed alarm set Nurse Communication: Mobility status PT Visit Diagnosis: Difficulty in walking, not elsewhere classified (R26.2);Muscle weakness (generalized) (M62.81)     Time: 1610-9604 PT Time Calculation (min) (ACUTE ONLY): 15 min  Charges:    $Gait Training: 8-22 mins PT General Charges $$ ACUTE PT VISIT: 1 Visit                     Angelis Gates, PT, GCS 12/05/23,1:35 PM

## 2023-12-06 LAB — CBC
HCT: 29.4 % — ABNORMAL LOW (ref 36.0–46.0)
Hemoglobin: 9.9 g/dL — ABNORMAL LOW (ref 12.0–15.0)
MCH: 29.9 pg (ref 26.0–34.0)
MCHC: 33.7 g/dL (ref 30.0–36.0)
MCV: 88.8 fL (ref 80.0–100.0)
Platelets: 200 10*3/uL (ref 150–400)
RBC: 3.31 MIL/uL — ABNORMAL LOW (ref 3.87–5.11)
RDW: 15.6 % — ABNORMAL HIGH (ref 11.5–15.5)
WBC: 11.9 10*3/uL — ABNORMAL HIGH (ref 4.0–10.5)
nRBC: 0.2 % (ref 0.0–0.2)

## 2023-12-06 LAB — BASIC METABOLIC PANEL
Anion gap: 15 (ref 5–15)
BUN: 13 mg/dL (ref 8–23)
CO2: 28 mmol/L (ref 22–32)
Calcium: 9.3 mg/dL (ref 8.9–10.3)
Chloride: 89 mmol/L — ABNORMAL LOW (ref 98–111)
Creatinine, Ser: 0.71 mg/dL (ref 0.44–1.00)
GFR, Estimated: >60 mL/min (ref >60)
Glucose, Bld: 102 mg/dL — ABNORMAL HIGH (ref 70–99)
Potassium: 4 mmol/L (ref 3.5–5.1)
Sodium: 132 mmol/L — ABNORMAL LOW (ref 135–145)

## 2023-12-06 LAB — GLUCOSE, CAPILLARY
Glucose-Capillary: 85 mg/dL (ref 70–99)
Glucose-Capillary: 175 mg/dL — ABNORMAL HIGH (ref 70–99)
Glucose-Capillary: 109 mg/dL — ABNORMAL HIGH (ref 70–99)
Glucose-Capillary: 174 mg/dL — ABNORMAL HIGH (ref 70–99)

## 2023-12-06 LAB — PROTIME-INR
INR: 1.9 — ABNORMAL HIGH (ref 0.8–1.2)
Prothrombin Time: 22.1 s — ABNORMAL HIGH (ref 11.4–15.2)

## 2023-12-06 MED ORDER — WARFARIN SODIUM 5 MG PO TABS
5.0000 mg | ORAL_TABLET | Freq: Once | ORAL | Status: AC
  Administered 2023-12-06: 5 mg via ORAL
  Filled 2023-12-06: qty 1

## 2023-12-06 MED ORDER — WARFARIN - PHYSICIAN DOSING INPATIENT: Freq: Every day | Status: DC

## 2023-12-06 NOTE — Progress Notes (Signed)
Occupational Therapy Treatment Patient Details Name: Christy Poole MRN: [id number] DOB: [birth year] Today's Date: 12/06/2023   History of present illness Pt is 63 yo presenting to [facility] on 11/28/23 for scheduled surgery. Pt currently s/p mitral vave replacement on 11/29/23. PMH: A-fib, DM, stroe, thyroid disease. [id number]   OT comments  Pt up in chair and sleepy. Gestured desire to return to bed. Hand held assist to walk to bathroom for toileting and sink to wash hands prior to return to bed. Pt attempting to crawl into bed, multimodal cues for technique, adhering to sternal precautions. VSS on RA.       If plan is discharge home, recommend the following:  A little help with walking and/or transfers;A lot of help with bathing/dressing/bathroom;Assistance with cooking/housework;Assist for transportation;Help with stairs or ramp for entrance;Direct supervision/assist for medications management;Direct supervision/assist for financial management   Equipment Recommendations  Tub/shower bench    Recommendations for Other Services      Precautions / Restrictions Precautions Precautions: Sternal;Fall Precaution/Restrictions Comments: adhering to precautions with sit to stand Restrictions Weight Bearing Restrictions Per Provider Order: No       Mobility Bed Mobility Overal bed mobility: Modified Independent                  Transfers Overall transfer level: Needs assistance Equipment used: 1 person hand held assist Transfers: Sit to/from Stand Sit to Stand: Contact guard assist           General transfer comment: slow to rise, no physical assist     Balance Overall balance assessment: Needs assistance   Sitting balance-Leahy Scale: Good     Standing balance support: Single extremity supported, During functional activity Standing balance-Leahy Scale: Fair                             ADL either performed or assessed with clinical judgement   ADL Overall ADL's  : Needs assistance/impaired     Grooming: Wash/dry hands;Standing;Set up                   Toilet Transfer: Minimal assistance;Ambulation;BSC/3in1   Toileting- Architect and Hygiene: Contact guard assist;Sit to/from stand       Functional mobility during ADLs: Minimal assistance      Extremity/Trunk Assessment              Vision       Perception     Praxis     Communication     Cognition Arousal: Alert Behavior During Therapy: WFL for tasks assessed/performed Cognition: Difficult to assess Difficult to assess due to: Non-English speaking           OT - Cognition Comments: communicating with gestures                          Cueing   Cueing Techniques: Gestural cues  Exercises      Shoulder Instructions       General Comments      Pertinent Vitals/ Pain       Pain Assessment Pain Assessment: Faces Faces Pain Scale: Hurts a little bit Pain Location: sternum/surgical site Pain Descriptors / Indicators: Discomfort, Grimacing, Guarding Pain Intervention(s): Monitored during session, Repositioned  Home Living  Prior Functioning/Environment              Frequency  Min 1X/week        Progress Toward Goals  OT Goals(current goals can now be found in the care plan section)  Progress towards OT goals: Progressing toward goals  Acute Rehab OT Goals OT Goal Formulation: With patient/family Time For Goal Achievement: 12/18/23 Potential to Achieve Goals: Good  Plan      Co-evaluation                 AM-PAC OT "6 Clicks" Daily Activity     Outcome Measure   Help from another person eating meals?: None Help from another person taking care of personal grooming?: A Little Help from another person toileting, which includes using toliet, bedpan, or urinal?: A Little Help from another person bathing (including washing, rinsing, drying)?: A  Little Help from another person to put on and taking off regular upper body clothing?: A Little Help from another person to put on and taking off regular lower body clothing?: A Little 6 Click Score: 19    End of Session Equipment Utilized During Treatment: Gait belt  OT Visit Diagnosis: Unsteadiness on feet (R26.81);Muscle weakness (generalized) (M62.81)   Activity Tolerance Patient limited by fatigue   Patient Left in bed;with call bell/phone within reach;with bed alarm set   Nurse Communication          Time: 2956-2130 OT Time Calculation (min): 15 min  Charges: OT General Charges $OT Visit: 1 Visit OT Treatments $Self Care/Home Management : 8-22 mins  Berna Spare, OTR/L Acute Rehabilitation Services Office: [phone]   Evern Bio 12/06/2023, 3:00 PM

## 2023-12-06 NOTE — Plan of Care (Signed)
Problem: Health Behavior/Discharge Planning: Goal: Ability to manage health-related needs will improve Outcome: Progressing   Problem: Clinical Measurements: Goal: Respiratory complications will improve Outcome: Progressing Goal: Cardiovascular complication will be avoided Outcome: Progressing   Problem: Activity: Goal: Risk for activity intolerance will decrease Outcome: Progressing

## 2023-12-06 NOTE — Progress Notes (Incomplete)
Mobility Specialist Progress Note:  Pre Mobility: During Mobility: Post Mobility:  Christy Poole Mobility Specialist Please contact via SecureChat or  Rehab office at [phone]

## 2023-12-06 NOTE — Progress Notes (Signed)
Mobility Specialist Progress Note:    12/06/23 1113  Mobility  Activity Transferred to/from [facility]  Level of Assistance Contact guard assist, steadying assist  Assistive Device  (HHA)  Distance Ambulated (ft) 175 ft  RUE Weight Bearing Per Provider Order NWB  LUE Weight Bearing Per Provider Order NWB  Activity Response Tolerated well  Mobility Referral Yes  Mobility visit 1 Mobility  Mobility Specialist Start Time (ACUTE ONLY) 1100  Mobility Specialist Stop Time (ACUTE ONLY) 1113  Mobility Specialist Time Calculation (min) (ACUTE ONLY) 13 min   Pt received in chair, agreeable to mobility session. Requested assistance to [facility]. HHA and CGA required for safety. Educated pt not to strain while on [facility]. Pt eager to ambulate in hallway. Tolerated well, asx throughout. Returned to room where pt brushed teeth. Returned to chair with all needs met, call bell in reach.   Feliciana Rossetti Mobility Specialist Please contact via Special educational needs teacher or  Rehab office at [phone]

## 2023-12-06 NOTE — Progress Notes (Incomplete)
Mobility Specialist Progress Note:   Christy Poole Mobility Specialist Please contact via SecureChat or  Rehab office at [phone]

## 2023-12-06 NOTE — TOC Progression Note (Signed)
Transition of Care (TOC) - Progression Note  Donn Pierini RN, BSN Transitions of Care Unit 4E- RN Case Manager See Treatment Team for direct phone #   Patient Details  Name: Christy Poole MRN: [id number] Date of Birth: 03-23-1961  Transition of Care [facility]) CM/SW Contact  Zenda Alpers Lenn Sink, RN Phone Number: 12/06/2023, 3:00 PM  Clinical Narrative:    Msg received from Cardiac Rehab that son requesting RW for home. Orders also placed for DME- BSC.   Pt does not speak english and translation tablet not available on unit and no translation app for dialect that pt speaks. No family at bedside at present. TC made to pt's son to discuss [facility] and DME needs. Son confirmed that pt needs RW, BSC, and tub bench for discharge home (does not have DME already). Son voiced that he does not have preference for provider and agreeable to use in house provider for delivery to room. Also re-affirmed HH arrangements with [facility] with [facility] office referral for HH needs- son voiced he is agreeable to referral as he does not have a preference as long as they take insurance.  [facility] liaison following for start of care.   Orders have been placed for all needed DME- RW/BSC/tub bench.  Call made to [employer] liaison for DME referral- RW/BSC/Tub bench to be delivered to room prior to discharge.    Expected Discharge Plan: Home w Home Health Services Barriers to Discharge: Continued Medical Work up  Expected Discharge Plan and Services   Discharge Planning Services: CM Consult Post Acute Care Choice: Home Health Living arrangements for the past 2 months: Single Family Home                 DME Arranged: Bedside commode, Tub bench, Walker rolling DME Agency: [employer] Date DME Agency Contacted: 12/06/23 Time DME Agency Contacted: 1459 Representative spoke with at DME Agency: Ian Malkin HH Arranged: PT, OT HH Agency: [facility] Date HH Agency Contacted: 11/29/23   Representative  spoke with at [facility] Agency: Aggie Cosier   Social Determinants of Health (SDOH) Interventions SDOH Screenings   Food Insecurity: Patient Declined (10/12/2023)  Recent Concern: Food Insecurity - Food Insecurity Present (08/30/2023)  Housing: Unknown (10/12/2023)  Recent Concern: Housing - Medium Risk (09/14/2023)  Transportation Needs: Patient Declined (10/12/2023)  Recent Concern: Transportation Needs - Unmet Transportation Needs (08/30/2023)  Utilities: Not At Risk (09/14/2023)  Alcohol Screen: Medium Risk (10/12/2023)  Depression (PHQ2-9): Low Risk  (11/06/2023)  Recent Concern: Depression (PHQ2-9) - Medium Risk (10/12/2023)  Financial Resource Strain: Patient Declined (10/12/2023)  Recent Concern: Financial Resource Strain - High Risk (08/30/2023)  Physical Activity: Inactive (08/30/2023)  Social Connections: Socially Isolated (08/30/2023)  Stress: Stress Concern Present (08/30/2023)  Tobacco Use: Low Risk  (11/29/2023)    Readmission Risk Interventions    09/13/2023    9:20 AM  Readmission Risk Prevention Plan  Transportation Screening Complete  PCP or Specialist Appt within 5-7 Days Complete  Home Care Screening Complete  Medication Review (RN CM) Complete

## 2023-12-06 NOTE — Progress Notes (Signed)
7 Days Post-Op Procedure(s) (LRB): MITRAL VALVE (MV) REPLACEMENT USING [employer] MECHANICAL VALVE (N/A) TRANSESOPHAGEAL ECHOCARDIOGRAM (TEE) (N/A) Subjective: Not SOB  Objective: Vital signs in last 24 hours: Temp:  [97.6 F (36.4 C)-98.5 F (36.9 C)] 98.5 F (36.9 C) (02/12 0515) Pulse Rate:  [81-95] 85 (02/12 0515) Cardiac Rhythm: Atrial fibrillation (02/11 2000) Resp:  [19-20] 20 (02/12 0515) BP: (120-146)/(62-84) 124/79 (02/12 0515) SpO2:  [94 %-99 %] 99 % (02/12 0515) Weight:  [47.1 kg] 47.1 kg (02/12 0500)  Hemodynamic parameters for last 24 hours:    Intake/Output from previous day: No intake/output data recorded. Intake/Output this shift: No intake/output data recorded.  General appearance: alert, cooperative, and no distress Heart: irregularly irregular rhythm Lungs: coarse BS Abdomen: benign Extremities: No edema Wound: incis healing well  Lab Results: Recent Labs    12/05/23 0313 12/06/23 0329  WBC 11.9* 11.9*  HGB 11.0* 9.9*  HCT 33.5* 29.4*  PLT 170 200   BMET:  Recent Labs    12/05/23 0313 12/06/23 0329  NA 131* 132*  K 4.1 4.0  CL 89* 89*  CO2 28 28  GLUCOSE 115* 102*  BUN 17 13  CREATININE 0.71 0.71  CALCIUM 9.6 9.3    PT/INR:  Recent Labs    12/06/23 0329  LABPROT 22.1*  INR 1.9*   ABG    Component Value Date/Time   PHART 7.346 (L) 11/29/2023 1756   HCO3 23.2 11/29/2023 1756   TCO2 25 11/29/2023 1756   ACIDBASEDEF 2.0 11/29/2023 1756   O2SAT 94 11/29/2023 1756   CBG (last 3)  Recent Labs    12/05/23 1652 12/05/23 2123 12/06/23 0617  GLUCAP 92 138* 85    Meds Scheduled Meds:  aspirin  81 mg Oral Daily   atorvastatin  80 mg Oral Daily   bisacodyl  10 mg Oral Daily   Or   bisacodyl  10 mg Rectal Daily   docusate sodium  200 mg Oral Daily   Fe Fum-Vit C-Vit B12-FA  1 capsule Oral QPC breakfast   gabapentin  100 mg Oral QHS   insulin aspart  0-24 Units Subcutaneous TID WC   levETIRAcetam  250 mg Oral  BID   levothyroxine  50 mcg Oral QAC breakfast   pantoprazole  40 mg Oral Daily   Warfarin - Pharmacist Dosing Inpatient   Does not apply q1600   Continuous Infusions: PRN Meds:.acetaminophen, dextrose, guaiFENesin-dextromethorphan, ipratropium-albuterol, metoprolol tartrate, ondansetron (ZOFRAN) IV, mouth rinse, oxyCODONE  Xrays No results found.  Assessment/Plan: S/P Procedure(s) (LRB): MITRAL VALVE (MV) REPLACEMENT USING [facility] [employer]ERS MECHANICAL VALVE (N/A) TRANSESOPHAGEAL ECHOCARDIOGRAM (TEE) (N/A) POD#7  1 afeb, afib w/CVR, s BP 120's-140's 2 sats good on RA 3 weight slightly below preop, off diuretics 4 BS good control 5 normal renal fxn 6 minor hyponatremia, hypochloremia- monitor clinically  7 minor leukocytosis( WBC11.9- stable), monitor clinically , has some bronchitis 8 INR 1.9- cont coumadin for mech valve, chronic afib 9 push pulm hygiene and rehab as able, poss home in 1-2 days     LOS: 7 days    Rowe Clack PA-C Pager [phone] 12/06/2023

## 2023-12-06 NOTE — Progress Notes (Signed)
CARDIAC REHAB PHASE I    Post [facility] education including site care, restrictions, heart healthy diabetic diet, sternal precautions, IS use at home, home needs at discharge, exercise guidelines and CRP2 reviewed pt and son. Son was able to translate for pt, translation tablet not available. All questions and concerns addressed. Will refer to [facility] for CRP2. Will continue to follow. Mobility team will plan to walk with pt after education completed.  [id number]  Woodroe Chen, RN BSN 12/06/2023 11:45 AM

## 2023-12-06 NOTE — Plan of Care (Signed)

## 2023-12-07 ENCOUNTER — Ambulatory Visit: Payer: Medicaid Other

## 2023-12-07 LAB — PROTIME-INR
INR: 2.4 — ABNORMAL HIGH (ref 0.8–1.2)
Prothrombin Time: 26.5 s — ABNORMAL HIGH (ref 11.4–15.2)

## 2023-12-07 LAB — GLUCOSE, CAPILLARY: Glucose-Capillary: 94 mg/dL (ref 70–99)

## 2023-12-07 MED ORDER — OXYCODONE HCL 5 MG PO TABS: 5.0000 mg | ORAL_TABLET | Freq: Four times a day (QID) | ORAL | 0 refills | Status: AC | PRN

## 2023-12-07 MED ORDER — SODIUM CHLORIDE 0.9% FLUSH: 3.0000 mL | INTRAVENOUS | Status: DC | PRN

## 2023-12-07 MED ORDER — ~~LOC~~ CARDIAC SURGERY, PATIENT & FAMILY EDUCATION: Freq: Once | Status: DC

## 2023-12-07 MED ORDER — WARFARIN SODIUM 2.5 MG PO TABS: 2.5000 mg | ORAL_TABLET | ORAL | Status: DC

## 2023-12-07 MED ORDER — MOMETASONE FURO-FORMOTEROL FUM 100-5 MCG/ACT IN AERO: 2.0000 | INHALATION_SPRAY | Freq: Two times a day (BID) | RESPIRATORY_TRACT | Status: AC | PRN

## 2023-12-07 MED ORDER — SODIUM CHLORIDE 0.9 % IV SOLN: 250.0000 mL | INTRAVENOUS | Status: DC | PRN

## 2023-12-07 MED ORDER — SODIUM CHLORIDE 0.9% FLUSH: 3.0000 mL | Freq: Two times a day (BID) | INTRAVENOUS | Status: DC

## 2023-12-07 NOTE — Progress Notes (Signed)
8 Days Post-Op Procedure(s) (LRB): MITRAL VALVE (MV) REPLACEMENT USING [employer] J[employer] MA[employer]ERS MECHANICAL VALVE (N/A) TRANSESOPHAGEAL ECHOCARDIOGRAM (TEE) (N/A) Subjective: Feels ok  Objective: Vital signs in last 24 hours: Temp:  [97.6 F (36.4 C)-98.6 F (37 C)] 98.3 F (36.8 C) (02/13 0418) Pulse Rate:  [77-107] 82 (02/13 0525) Cardiac Rhythm: Atrial fibrillation (02/12 1930) Resp:  [15-21] 21 (02/13 0525) BP: (102-119)/(53-64) 102/62 (02/13 0418) SpO2:  [74 %-100 %] 74 % (02/13 0525) Weight:  [47.7 kg] 47.7 kg (02/13 0525)  Hemodynamic parameters for last 24 hours:    Intake/Output from previous day: 02/12 0701 - 02/13 0700 In: 240 [P.O.:240] Out: 1300 [Urine:1300] Intake/Output this shift: No intake/output data recorded.  General appearance: alert, cooperative, and no distress Heart: irregularly irregular rhythm Lungs: clear to auscultation bilaterally Abdomen: benign Extremities: no edema Wound: incis healing well  Lab Results: Recent Labs    12/05/23 0313 12/06/23 0329  WBC 11.9* 11.9*  HGB 11.0* 9.9*  HCT 33.5* 29.4*  PLT 170 200   BMET:  Recent Labs    12/05/23 0313 12/06/23 0329  NA 131* 132*  K 4.1 4.0  CL 89* 89*  CO2 28 28  GLUCOSE 115* 102*  BUN 17 13  CREATININE 0.71 0.71  CALCIUM 9.6 9.3    PT/INR:  Recent Labs    12/07/23 0340  LABPROT 26.5*  INR 2.4*   ABG    Component Value Date/Time   PHART 7.346 (L) 11/29/2023 1756   HCO3 23.2 11/29/2023 1756   TCO2 25 11/29/2023 1756   ACIDBASEDEF 2.0 11/29/2023 1756   O2SAT 94 11/29/2023 1756   CBG (last 3)  Recent Labs    12/06/23 1628 12/06/23 2118 12/07/23 0625  GLUCAP 109* 174* 94    Meds Scheduled Meds:  aspirin  81 mg Oral Daily   atorvastatin  80 mg Oral Daily   bisacodyl  10 mg Oral Daily   Or   bisacodyl  10 mg Rectal Daily   docusate sodium  200 mg Oral Daily   Fe Fum-Vit C-Vit B12-FA  1 capsule Oral QPC breakfast   gabapentin  100 mg Oral QHS   insulin aspart   0-24 Units Subcutaneous TID WC   levETIRAcetam  250 mg Oral BID   levothyroxine  50 mcg Oral QAC breakfast   pantoprazole  40 mg Oral Daily   Warfarin - Physician Dosing Inpatient   Does not apply q1600   Continuous Infusions: PRN Meds:.acetaminophen, dextrose, guaiFENesin-dextromethorphan, ipratropium-albuterol, metoprolol tartrate, ondansetron (ZOFRAN) IV, mouth rinse, oxyCODONE  Xrays No results found.  Assessment/Plan: S/P Procedure(s) (LRB): MITRAL VALVE (MV) REPLACEMENT USING [employer] J[employer] MA[employer]ERS MECHANICAL VALVE (N/A) TRANSESOPHAGEAL ECHOCARDIOGRAM (TEE) (N/A) POD#8  1 afeb, VSS, afib, CVR 2 O2 sats on RA 3 weight stable 4 BS well controlled 5 INR 2.4, will resume home schedule for coumadin, and check next week 6 stable for discharge  LOS: 8 days    Rowe Clack PA-C Pager [phone] 12/07/2023

## 2023-12-07 NOTE — Discharge Instructions (Signed)
Information on my medicine - Coumadin   (Warfarin)  This medication education was reviewed with me or my healthcare representative as part of my discharge preparation.    Why was Coumadin prescribed for you? Coumadin was prescribed for you because you have a blood clot or a medical condition that can cause an increased risk of forming blood clots. Blood clots can cause serious health problems by blocking the flow of blood to the heart, lung, or brain. Coumadin can prevent harmful blood clots from forming. As a reminder your indication for Coumadin is:  Blood Clot Prevention after Heart Valve Surgery  What test will check on my response to Coumadin? While on Coumadin (warfarin) you will need to have an INR test regularly to ensure that your dose is keeping you in the desired range. The INR (international normalized ratio) number is calculated from the result of the laboratory test called prothrombin time (PT).  If an INR APPOINTMENT HAS NOT ALREADY BEEN MADE FOR YOU please schedule an appointment to have this lab work done by your health care provider within 7 days. Your INR goal is usually a number between:  2 to 3 or your provider may give you a more narrow range like 2-2.5.  Ask your health care provider during an office visit what your goal INR is.  What  do you need to  know  About  COUMADIN? Take Coumadin (warfarin) exactly as prescribed by your healthcare provider about the same time each day.  DO NOT stop taking without talking to the doctor who prescribed the medication.  Stopping without other blood clot prevention medication to take the place of Coumadin may increase your risk of developing a new clot or stroke.  Get refills before you run out.  What do you do if you miss a dose? If you miss a dose, take it as soon as you remember on the same day then continue your regularly scheduled regimen the next day.  Do not take two doses of Coumadin at the same time.  Important Safety  Information A possible side effect of Coumadin (Warfarin) is an increased risk of bleeding. You should call your healthcare provider right away if you experience any of the following: Bleeding from an injury or your nose that does not stop. Unusual colored urine (red or dark brown) or unusual colored stools (red or black). Unusual bruising for unknown reasons. A serious fall or if you hit your head (even if there is no bleeding).  Some foods or medicines interact with Coumadin (warfarin) and might alter your response to warfarin. To help avoid this: Eat a balanced diet, maintaining a consistent amount of Vitamin K. Notify your provider about major diet changes you plan to make. Avoid alcohol or limit your intake to 1 drink for women and 2 drinks for men per day. (1 drink is 5 oz. wine, 12 oz. beer, or 1.5 oz. liquor.)  Make sure that ANY health care provider who prescribes medication for you knows that you are taking Coumadin (warfarin).  Also make sure the healthcare provider who is monitoring your Coumadin knows when you have started a new medication including herbals and non-prescription products.  Coumadin (Warfarin)  Major Drug Interactions  Increased Warfarin Effect Decreased Warfarin Effect  Alcohol (large quantities) Antibiotics (esp. Septra/Bactrim, Flagyl, Cipro) Amiodarone (Cordarone) Aspirin (ASA) Cimetidine (Tagamet) Megestrol (Megace) NSAIDs (ibuprofen, naproxen, etc.) Piroxicam (Feldene) Propafenone (Rythmol SR) Propranolol (Inderal) Isoniazid (INH) Posaconazole (Noxafil) Barbiturates (Phenobarbital) Carbamazepine (Tegretol) Chlordiazepoxide (Librium) Cholestyramine (Questran) Griseofulvin  Oral Contraceptives Rifampin Sucralfate (Carafate) Vitamin K   Coumadin (Warfarin) Major Herbal Interactions  Increased Warfarin Effect Decreased Warfarin Effect  Garlic Ginseng Ginkgo biloba Coenzyme Q10 Green tea St. John's wort    Coumadin (Warfarin) FOOD  Interactions  Eat a consistent number of servings per week of foods HIGH in Vitamin K (1 serving =  cup)  Collards (cooked, or boiled & drained) Kale (cooked, or boiled & drained) Mustard greens (cooked, or boiled & drained) Parsley *serving size only =  cup Spinach (cooked, or boiled & drained) Swiss chard (cooked, or boiled & drained) Turnip greens (cooked, or boiled & drained)  Eat a consistent number of servings per week of foods MEDIUM-HIGH in Vitamin K (1 serving = 1 cup)  Asparagus (cooked, or boiled & drained) Broccoli (cooked, boiled & drained, or raw & chopped) Brussel sprouts (cooked, or boiled & drained) *serving size only =  cup Lettuce, raw (green leaf, endive, romaine) Spinach, raw Turnip greens, raw & chopped   These websites have more information on Coumadin (warfarin):  http://www.king-russell.com/; https://www.hines.net/;

## 2023-12-07 NOTE — Progress Notes (Signed)
Physical Therapy Treatment Patient Details Name: Christy Poole MRN: [id number] DOB: 11/22/1960 Today's Date: 12/07/2023   History of Present Illness 63 yo admitted 11/28/23 for MVR on 11/29/23. PMH: A-fib, DM, CVA, thyroid disease.    PT Comments  Pt pleasant with son present to interpret. Both educated for all sternal precautions, transfers, gait, stairs, activity progression and functional mobility. Son concerned with schedule and needing to get home. Pt with mobility appropriate for return home and son states RW at home for use.     If plan is discharge home, recommend the following: A little help with walking and/or transfers;A little help with bathing/dressing/bathroom;Assist for transportation;Help with stairs or ramp for entrance;Assistance with cooking/housework   Can travel by private vehicle        Equipment Recommendations  None recommended by PT    Recommendations for Other Services       Precautions / Restrictions Precautions Precautions: Sternal;Fall Precaution Booklet Issued: No Precaution/Restrictions Comments: educated with son present to interpret     Mobility  Bed Mobility Overal bed mobility: Needs Assistance Bed Mobility: Rolling, Sidelying to Sit Rolling: Supervision Sidelying to sit: Supervision   Sit to supine: Supervision   General bed mobility comments: verbal and tactile cues to maintain precautions with rolling and rise from side then return to supine. Increased time and cues for scooting to EOB without use of hands    Transfers Overall transfer level: Needs assistance   Transfers: Sit to/from Stand Sit to Stand: Supervision           General transfer comment: cues for hand placement    Ambulation/Gait Ambulation/Gait assistance: Contact guard assist Gait Distance (Feet): 300 Feet Assistive device: 1 person hand held assist Gait Pattern/deviations: Step-through pattern, Decreased stride length   Gait velocity interpretation: 1.31 -  2.62 ft/sec, indicative of limited community ambulator   General Gait Details: cues for direction, CGA with HHA   Stairs Stairs: Yes Stairs assistance: Supervision Stair Management: Alternating pattern, Forwards, One rail Left Number of Stairs: 10 General stair comments: cues for sequence and proximity to rail   Wheelchair Mobility     Tilt Bed    Modified Rankin (Stroke Patients Only)       Balance Overall balance assessment: Mild deficits observed, not formally tested Sitting-balance support: Feet supported, No upper extremity supported Sitting balance-Leahy Scale: Good     Standing balance support: Single extremity supported, During functional activity Standing balance-Leahy Scale: Good                              Communication Communication Communication: No apparent difficulties  Cognition Arousal: Alert Behavior During Therapy: WFL for tasks assessed/performed   PT - Cognitive impairments: Difficult to assess Difficult to assess due to: Non-English speaking                     PT - Cognition Comments: I pad does not have language, son present to interpret Following commands: Intact      Cueing Cueing Techniques: Verbal cues, Gestural cues, Tactile cues  Exercises      General Comments        Pertinent Vitals/Pain Pain Assessment Pain Assessment: 0-10 Pain Score: 2  Pain Location: sternum/surgical site Pain Descriptors / Indicators: Aching Pain Intervention(s): Limited activity within patient's tolerance, Monitored during session, Repositioned    Home Living  Prior Function            PT Goals (current goals can now be found in the care plan section) Progress towards PT goals: Progressing toward goals    Frequency    Min 1X/week      PT Plan      Co-evaluation              AM-PAC PT "6 Clicks" Mobility   Outcome Measure  Help needed turning from your back to your  side while in a flat bed without using bedrails?: A Little Help needed moving from lying on your back to sitting on the side of a flat bed without using bedrails?: A Little Help needed moving to and from a bed to a chair (including a wheelchair)?: A Little Help needed standing up from a chair using your arms (e.g., wheelchair or bedside chair)?: A Little Help needed to walk in hospital room?: A Little Help needed climbing 3-5 steps with a railing? : A Little 6 Click Score: 18    End of Session   Activity Tolerance: Patient tolerated treatment well Patient left: in bed;with call bell/phone within reach;with family/visitor present Nurse Communication: Mobility status PT Visit Diagnosis: Difficulty in walking, not elsewhere classified (R26.2);Muscle weakness (generalized) (M62.81)     Time: 4098-1191 PT Time Calculation (min) (ACUTE ONLY): 19 min  Charges:    $Gait Training: 8-22 mins PT General Charges $$ ACUTE PT VISIT: 1 Visit                     Merryl Hacker, PT Acute Rehabilitation Services Office: [phone]    Enedina Finner Nya Monds 12/07/2023, 11:57 AM

## 2023-12-07 NOTE — Progress Notes (Signed)
Son has arrived to hospital, he was unaware of prior attempt to notify him that patient will be discharging home. Son, Darcus Austin stated that he had obligations and unable to stay to wait for discharge equipment to be delivered. I advised the son that she would be discharging today, if he needs to leave and come back to pick her up we can have her at the entrance upon his return. Son opted to stay and wait for patient to be discharged.

## 2023-12-07 NOTE — TOC Transition Note (Signed)
Transition of Care (TOC) - Discharge Note Donn Pierini RN, BSN Transitions of Care Unit 4E- RN Case Manager See Treatment Team for direct phone #   Patient Details  Name: Christy Poole MRN: [id number] Date of Birth: [birth year]  Transition [facility]) CM/SW Contact:  Darrold Span, RN Phone Number: 12/07/2023, 11:28 AM   Clinical Narrative:    Pt stable for transition home today, DME still pending delivery, CM checked with [employer] liaison to see about delivery ETA.  Per liaison they are out of stock on tub benches and will have tub bench shipped to home. RW and BSC will be delivered to room shortly.   Son to transport home.   [facility] liaison notified of discharge for start of care (HHPTOT).   No further TOC needs noted.    Final next level of care: Home w Home Health Services Barriers to Discharge: Barriers Resolved   Patient Goals and CMS Choice Patient states their goals for this hospitalization and ongoing recovery are:: plan to return home          Discharge Placement                 Home w/ [facility]      Discharge Plan and Services Additional resources added to the After Visit Summary for     Discharge Planning Services: CM Consult Post Acute Care Choice: Home Health          DME Arranged: Bedside commode, Tub bench, Walker rolling DME Agency: [employer] Date DME Agency Contacted: 12/06/23 Time DME Agency Contacted: 1459 Representative spoke with at DME Agency: Ian Malkin HH Arranged: PT, OT HH Agency: [facility] Date HH Agency Contacted: 11/29/23   Representative spoke with at [facility] Agency: Aggie Cosier  Social Drivers of Health (SDOH) Interventions SDOH Screenings   Food Insecurity: Patient Declined (10/12/2023)  Recent Concern: Food Insecurity - Food Insecurity Present (08/30/2023)  Housing: Unknown (10/12/2023)  Recent Concern: Housing - Medium Risk (09/14/2023)  Transportation Needs: Patient Declined (10/12/2023)  Recent  Concern: Transportation Needs - Unmet Transportation Needs (08/30/2023)  Utilities: Not At Risk (09/14/2023)  Alcohol Screen: Medium Risk (10/12/2023)  Depression (PHQ2-9): Low Risk  (11/06/2023)  Recent Concern: Depression (PHQ2-9) - Medium Risk (10/12/2023)  Financial Resource Strain: Patient Declined (10/12/2023)  Recent Concern: Financial Resource Strain - High Risk (08/30/2023)  Physical Activity: Inactive (08/30/2023)  Social Connections: Socially Isolated (08/30/2023)  Stress: Stress Concern Present (08/30/2023)  Tobacco Use: Low Risk  (11/29/2023)     Readmission Risk Interventions    12/07/2023   11:27 AM 09/13/2023    9:20 AM  Readmission Risk Prevention Plan  Post Dischage Appt Complete   Medication Screening Complete   Transportation Screening Complete Complete  PCP or Specialist Appt within 5-7 Days  Complete  Home Care Screening  Complete  Medication Review (RN CM)  Complete

## 2023-12-07 NOTE — Progress Notes (Signed)
Signed and held orders release this morning pending from 02/10. Duplicate order for CXR post MV placement. This XR was obtained on the 2/10. Newly released order discontinued, patient is discharging home.

## 2023-12-07 NOTE — Progress Notes (Signed)
Attempted to notify Son [name] via phone call that patient has discharge orders and will be leaving today.

## 2023-12-08 ENCOUNTER — Ambulatory Visit: Payer: Medicaid Other

## 2023-12-08 DIAGNOSIS — Z48812 Encounter for surgical aftercare following surgery on the circulatory system: Secondary | ICD-10-CM | POA: Diagnosis not present

## 2023-12-11 ENCOUNTER — Other Ambulatory Visit: Payer: Self-pay | Admitting: Thoracic Surgery (Cardiothoracic Vascular Surgery)

## 2023-12-11 DIAGNOSIS — I050 Rheumatic mitral stenosis: Secondary | ICD-10-CM

## 2023-12-12 ENCOUNTER — Telehealth: Payer: Self-pay

## 2023-12-12 ENCOUNTER — Ambulatory Visit: Payer: Medicaid Other

## 2023-12-12 NOTE — Telephone Encounter (Signed)
 Called and spoke with pt's son d/t pt's scheduled appt with Coumadin clinic tomorrow afternoon. Our clinic is closing early tomorrow d/t possible inclement weather and appt needed to be rescheduled to earlier time (before noon). Pt's son states pt now has Home Health thorough [facility] and requested if INR could be checked at next home health visit instead of coming to clinic. I called and spoke with [place] at [facility]. Provided verbal order for INR to be checked at next Home Health visit. INR will be checked on Thursday, 12/14/23 and results will be called to the [facility] coumadin clinic [phone].

## 2023-12-13 ENCOUNTER — Ambulatory Visit: Payer: Medicaid Other

## 2023-12-14 ENCOUNTER — Ambulatory Visit (INDEPENDENT_AMBULATORY_CARE_PROVIDER_SITE_OTHER): Payer: Medicaid Other | Admitting: Internal Medicine

## 2023-12-14 ENCOUNTER — Ambulatory Visit (INDEPENDENT_AMBULATORY_CARE_PROVIDER_SITE_OTHER): Payer: Self-pay | Admitting: Physician Assistant

## 2023-12-14 ENCOUNTER — Ambulatory Visit: Payer: Medicaid Other

## 2023-12-14 VITALS — BP 129/77 | HR 77 | Resp 20 | Wt 104.4 lb

## 2023-12-14 DIAGNOSIS — Z952 Presence of prosthetic heart valve: Secondary | ICD-10-CM

## 2023-12-14 DIAGNOSIS — Z5181 Encounter for therapeutic drug level monitoring: Secondary | ICD-10-CM | POA: Diagnosis not present

## 2023-12-14 DIAGNOSIS — I4821 Permanent atrial fibrillation: Secondary | ICD-10-CM

## 2023-12-14 LAB — POCT INR: INR: 1.5 — AB (ref 2.0–3.0)

## 2023-12-14 MED ORDER — ENOXAPARIN SODIUM 40 MG/0.4ML IJ SOSY: 40.0000 mg | PREFILLED_SYRINGE | Freq: Two times a day (BID) | INTRAMUSCULAR | 1 refills | Status: DC

## 2023-12-14 NOTE — Patient Instructions (Signed)
 Please resume Lovenox Injections twice daily..... Coumadin pharmacy will stop injection once INR (Blood) in thinned.   Discharge Instructions:  1. You may shower, please wash incisions daily with soap and water and keep dry.  If you wish to cover wounds with dressing you may do so but please keep clean and change daily.  No tub baths or swimming until incisions have completely healed.  If your incisions become red or develop any drainage please call our office at [phone]  2. No Driving until cleared by Dr. Karolee Ohs office and you are no longer using narcotic pain medications  3. Monitor your weight daily.. Please use the same scale and weigh at same time... If you gain 5-10 lbs in 48 hours with associated lower extremity swelling, please contact our office at [phone]  4. Fever of 101.5 for at least 24 hours with no source, please contact our office at [phone]  5. Activity- up as tolerated, please walk at least 3 times per day.  Avoid strenuous activity, no lifting, pushing, or pulling with your arms over 8-10 lbs for a minimum of 6 weeks  6. If any questions or concerns arise, please do not hesitate to contact our office at [phone]

## 2023-12-14 NOTE — Progress Notes (Signed)
 [address]       [place] [address]             [id number]   HPI: Patient returns for 1 week postoperative follow-up having undergone MV Replacement with mechanical valve on 11/29/2023. The patient's early postoperative recovery while in the hospital was notable for anemia requiring blood transfusion, Atrial Fibrillation with conversion to NSR, and some episodic bradycardia. The patient is accompanied by her son.  Translation is provided by Riz.  The son is upset about patient discharge from hospital.  He states he was not educated about care on discharge.  Nor was he told she would be discharging home.  He states he felt very unprepared as he had a baby at home and his wife works at hospital.  Since hospital discharge the patient reports she continues to have pain along her chest.  She has been instructed via home health nurse to work on taking deep breaths.  The son is concerned that she is not doing very much.  He states she eats and likes to watch tv.  She had her INR drawn by home health due to the weather.  This came back low at 1.5 mg for a mechanical INR.  I asked her son about her dietary habits and he states she is eating a lot of lentils, rice, and traditional [place] food.  I explained that the lentils contain Vit K which could affect INR level.  I instructed him to provide pharmacy with food intake.  Her bowel habits are normal.  They have been washing her incisions.  She has some bruising along left arm and abdomen.  Current Outpatient Medications  Medication Sig Dispense Refill   acetaminophen (TYLENOL) 500 MG tablet Take 500 mg by mouth every 6 (six) hours as needed for mild pain (pain score 1-3), headache or fever.     albuterol (VENTOLIN HFA) 108 (90 Base) MCG/ACT inhaler Inhale 1-2 puffs into the lungs every 4 (four) hours as needed for wheezing or shortness of breath. 1 each 2   aspirin EC (ASPIRIN LOW DOSE) 81 MG tablet TAKE 1 TABLET (81 MG TOTAL) BY MOUTH  DAILY. (SWALLOW WHOLE) 90 tablet 3   atorvastatin (LIPITOR) 80 MG tablet TAKE 1 TABLET (80 MG TOTAL) BY MOUTH DAILY. 90 tablet 3   cetaphil (CETAPHIL) lotion Apply 1 Application topically daily as needed for dry skin.     gabapentin (NEURONTIN) 100 MG capsule Take 1 capsule (100 mg total) by mouth at bedtime. 60 capsule 1   levETIRAcetam (KEPPRA) 250 MG tablet Take 1 tablet (250 mg total) by mouth 2 (two) times daily. 180 tablet 3   levothyroxine (SYNTHROID) 50 MCG tablet TAKE 1 TABLET (50 MCG TOTAL) BY MOUTH AT BEDTIME. 90 tablet 0   metFORMIN (GLUCOPHAGE-XR) 500 MG 24 hr tablet TAKE 1 TABLET (500 MG TOTAL) BY MOUTH DAILY WITH BREAKFAST. 90 tablet 1   mometasone-formoterol (DULERA) 100-5 MCG/ACT AERO Inhale 2 puffs into the lungs 2 (two) times daily as needed for wheezing or shortness of breath.     oxyCODONE (OXY IR/ROXICODONE) 5 MG immediate release tablet Take 1 tablet (5 mg total) by mouth every 6 (six) hours as needed for up to 7 days for severe pain (pain score 7-10). 28 tablet 0   warfarin (COUMADIN) 2.5 MG tablet Take 1-1.5 tablets (2.5-3.75 mg total) by mouth See admin instructions. Take 3.75 mg on Thurs, Fri, Sat, Mon, Tues, Wed. Take 2.5 mg on Sun.  No current facility-administered medications for this visit.    Physical Exam:  BP 129/77 (BP Location: Left Arm, Patient Position: Sitting, Cuff Size: Normal)   Pulse 77   Resp 20   Wt 104 lb 6.4 oz (47.4 kg)   BMI 21.09 kg/m   Gen: NAD Heart: RRR, + valve click Lungs: CTA bilaterally Ext: no edema Incisions: well healed, no evidence of infection  Diagnostic Tests:   Not Performed [facility] closed early  A/P;  S/P Mechanical MVR- overall patient is progressing.  They were instructed to increase distance and frequency of ambulation INR-1.5 today, instructed to resume Lovenox injections until coumadin level is therapeutic.Marland Kitchen spoke with coumadin clinic and they are aware of plan and are monitoring patient closely..  planning for repeat INR check Monday Atrial Fibrillation H/O Seizure disorder-on Kepra HLD-Lipitor Hypothyroidism- on Synthroid  Provide patient with education materials about foods that interact with warfarin.  Encouraged to ambulate at least 3 times per day in the hospital.  Her level of pain is expected with her surgery and will continue to improve with time.  Gave wound care instructions of continued showering and bruising should resolve with time.  They will get CXR in the next 24-48 hours.. We will contact with results  RTC in 3 weeks with Dr. Dara Lords, PA-C [facility] [phone]

## 2023-12-14 NOTE — Patient Instructions (Addendum)
 Description   START Lovenox 40mg  BID.   Spoke to home health nurse and instructed for pt to take 2 tablets  of warfarin today and tomorrow then resume taking 1.5 tablets every day except 1 tablet of Sundays.  Stay consistent with greens each week (2 per week)  Recheck INR on 2/24 by home health.  Coumadin Clinic [phone]

## 2023-12-14 NOTE — Addendum Note (Signed)
 Addended by: Cory Roughen on: 12/14/2023 03:14 PM   Modules accepted: Orders

## 2023-12-15 ENCOUNTER — Other Ambulatory Visit: Payer: Self-pay | Admitting: Cardiology

## 2023-12-15 DIAGNOSIS — I4821 Permanent atrial fibrillation: Secondary | ICD-10-CM

## 2023-12-15 DIAGNOSIS — I050 Rheumatic mitral stenosis: Secondary | ICD-10-CM

## 2023-12-18 ENCOUNTER — Ambulatory Visit (INDEPENDENT_AMBULATORY_CARE_PROVIDER_SITE_OTHER): Payer: Self-pay

## 2023-12-18 DIAGNOSIS — Z5181 Encounter for therapeutic drug level monitoring: Secondary | ICD-10-CM

## 2023-12-18 LAB — POCT INR: INR: 1.6 — AB (ref 2.0–3.0)

## 2023-12-18 NOTE — Patient Instructions (Addendum)
 Description   Continue Lovenox 40mg  BID.  Spoke to home health nurse [phone] and instructed for pt take 2 tablets of warfarin today, tomorrow, and Wednesday then START taking 1.5 tablets every day except 2 tablets of Sundays. DO NOT EAT GREENS AT THIS TIME  Stay consistent with greens each week (2 per week)  Recheck INR on 2/28 by home health.  Coumadin Clinic [phone]

## 2023-12-19 ENCOUNTER — Telehealth: Payer: Self-pay | Admitting: Cardiology

## 2023-12-19 ENCOUNTER — Ambulatory Visit: Payer: Medicaid Other

## 2023-12-19 NOTE — Telephone Encounter (Signed)
 Called and spoke with pt's son (DPR). Confirmed pt did have a small nose bleed yesterday; however, pts son states it was easily stopped when she used a tissue after 2-3 minutes. No other signs or symptoms of bleeding. I did recommend saline nasal spray twice per day and to put a humidifier in bedroom if possible per Gillian Shields, NP. He verbalized understanding and was very appreciative foe the call. Pt scheduled to have INR rechecked by Home Health on Friday, 01/19/24. Made pt's son aware if other bleeding symptoms occur, she should seek immediate medical attention.

## 2023-12-19 NOTE — Telephone Encounter (Signed)
 Difficult to address as I have never seen her. She just saw Coumadin Clinic yesterday. She is on Lovenox until her INR is therapeutic on Coumadin. Will route to Coumadin clinic so they are aware.   Nose bleeds are common in cold weather particularly as heat is very drying. Recommend saline nasal spray twice per day. Put humidified in bedroom if possible.   Alver Sorrow, NP

## 2023-12-19 NOTE — Telephone Encounter (Signed)
 Spoke to [facility] from [facility] [place].  Hollyn spoke to pt's son who is her interpreter. Son stated pt has a nose bleed "recently" unable to give specific time of nose bleed and stated he let provider know but currently have no record of.  Hollyn concerned on pt being on Lovenox, Coumadin and ASA. Son also concerned on home health aide obtaining INR on 2/20 (due to inclement weather). Advised Hollyn that pt will need to f/u with Coumadin clinic.  Will route medication concerns to APP. She verbalized understanding and will call son back when she heard back from [place].

## 2023-12-19 NOTE — Telephone Encounter (Signed)
 [facility] calling to discuss concerns with pts medlist. Requesting cb so she can f/u with pt's son

## 2023-12-20 ENCOUNTER — Telehealth (HOSPITAL_BASED_OUTPATIENT_CLINIC_OR_DEPARTMENT_OTHER): Payer: Self-pay | Admitting: Cardiology

## 2023-12-20 NOTE — Telephone Encounter (Signed)
 Caller [facility]) wants a call back directly to [phone] to discuss next steps regarding patient's blood thinners.  See note dated 2/25.

## 2023-12-20 NOTE — Telephone Encounter (Signed)
Attempted to return call, no answer, unable to leave message.

## 2023-12-20 NOTE — Telephone Encounter (Signed)
 Calling back to get clarification. Please advise

## 2023-12-20 NOTE — Telephone Encounter (Signed)
 Spoke with [facility] regarding Warfarin and Lovenox Reviewed recommendation of anitcoag clinic from 12/18/2023 [facility] did want for them do be aware that [facility] had to stick patient several times secondary to machine coming up error in case they preferred patient come into office for INR checks    Will forward to Anticoag clinic so they will be aware when [facility] calls in Friday with INR

## 2023-12-21 ENCOUNTER — Ambulatory Visit (INDEPENDENT_AMBULATORY_CARE_PROVIDER_SITE_OTHER): Payer: Self-pay

## 2023-12-21 DIAGNOSIS — Z5181 Encounter for therapeutic drug level monitoring: Secondary | ICD-10-CM

## 2023-12-21 LAB — POCT INR: INR: 1.9 — AB (ref 2.0–3.0)

## 2023-12-21 NOTE — Patient Instructions (Signed)
 Description   Continue Lovenox 40mg  BID. Do not eat greens this week.  Spoke to home health nurse Vernona Rieger, RN and instructed for pt take 2 tablets of warfarin today and 2 tablets tomorrow then START taking 1.5 tablets every day except 2 tablets of Sundays and Tuesdays.  Stay consistent with greens each week (2 per week)  Recheck INR in 1 week  Coumadin Clinic [phone]

## 2023-12-21 NOTE — Telephone Encounter (Signed)
 Called Hollyn with [facility] to clarify why they attempted to check pt's INR yesterday. Hollyn states they are a case management team that was consulted when pt was discharged from the hospital through [facility]. She states when she saw Warfarin and Lovenox on pt's medication list, she felt that an INR should be checked while she was at pt's home. Hollyn was unaware that Ms Foland's INR is monitored by our Coumadin Clinic and being checked by [facility]. She apologized for the confusion and appreciated our office reaching out to clarify.   I called [facility] and spoke with Rochelle, RN. She states pt is scheduled to have her INR checked today by Vernona Rieger.  I called and spoke with Vernona Rieger. Confirmed she will go to pt's home today and check INR. Provided direct coumadin clinic number to call with INR results [phone]).

## 2023-12-27 ENCOUNTER — Ambulatory Visit (HOSPITAL_BASED_OUTPATIENT_CLINIC_OR_DEPARTMENT_OTHER): Payer: Medicaid Other | Admitting: Cardiology

## 2023-12-27 ENCOUNTER — Ambulatory Visit: Payer: Medicaid Other | Attending: Cardiology

## 2023-12-27 ENCOUNTER — Encounter (HOSPITAL_BASED_OUTPATIENT_CLINIC_OR_DEPARTMENT_OTHER): Payer: Self-pay | Admitting: Cardiology

## 2023-12-27 VITALS — BP 110/68 | HR 82 | Ht 59.0 in | Wt 102.2 lb

## 2023-12-27 DIAGNOSIS — Z8673 Personal history of transient ischemic attack (TIA), and cerebral infarction without residual deficits: Secondary | ICD-10-CM | POA: Diagnosis not present

## 2023-12-27 DIAGNOSIS — Z952 Presence of prosthetic heart valve: Secondary | ICD-10-CM

## 2023-12-27 DIAGNOSIS — Z7901 Long term (current) use of anticoagulants: Secondary | ICD-10-CM | POA: Insufficient documentation

## 2023-12-27 DIAGNOSIS — I4821 Permanent atrial fibrillation: Secondary | ICD-10-CM | POA: Diagnosis not present

## 2023-12-27 DIAGNOSIS — I050 Rheumatic mitral stenosis: Secondary | ICD-10-CM

## 2023-12-27 DIAGNOSIS — D6869 Other thrombophilia: Secondary | ICD-10-CM

## 2023-12-27 LAB — POCT INR: INR: 1.7 — AB (ref 2.0–3.0)

## 2023-12-27 NOTE — Progress Notes (Signed)
 Cardiology Office Note:  .   Date:  12/27/2023  ID:  STUTI SANDIN, DOB [birth year], MRN [id number] PCP: de [place], Raymond J, MD  [facility] Providers Cardiologist:  Jodelle Red, MD {  History of Present Illness: .   Christy Poole is a 63 y.o. 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 Initially seen by Dr. Royann Shivers in the hospital 11/2021.   Cardiac history: Rheumatic mitral stenosis, s/p balloon valvuloplasty in [place] in ~2016/2017. Reported prior cath without significant CAD. Followed in [place] and then [facility] in [place]. History of TIA/CVA as well, initial 2019 and most recent 11/2021. Echo 11/2021 with moderate MS, Wilkins score 7-8. Permanent atrial fibrillation. On coumadin.   CV risk factors: She is vegetarian and eats primarily [place] food. She does not exercise regularly. Father had MI, brother had bypass surgery.  Today: Here with two family members today who serve as interpreters.   Underwent mitral valve replacement 11/29/23 with Dr. Leafy Ro, now has 27 mm St Jude mechanical mitral valve. Also s/p LAA closure. Post op pain well controlled. No significant bleeding on blood thinner. Bleeding is well controlled. Having bowel movements. Appetite not at baseline.   Has follow up echo scheduled for 01/11/24. Has appt with Dr. Leafy Ro on 01/04/24.  Family notes that at some point in the future they would like to travel to [place] for up to 2 mos. Discussed that once that is planned, to let us know and we can try to arrange for INR monitoring long distance to make sure she remains optimized on her coumadin.  ROS: Denies shortness of breath at rest. No PND, orthopnea, LE edema or unexpected weight gain. ROS otherwise negative except as noted.   Studies Reviewed: Marland Kitchen    EKG:       Physical Exam:    VS:  BP 110/68   Pulse 82   Ht 4\' 11"  (1.499 m)   Wt 102 lb 3.2 oz (46.4 kg)   SpO2 96%   BMI 20.64 kg/m    Wt Readings from Last 3 Encounters:  12/27/23 102 lb 3.2 oz (46.4 kg)  12/14/23 104 lb 6.4 oz (47.4 kg)  12/07/23 105 lb 2.6 oz (47.7 kg)    GEN: Well nourished, well developed in no acute distress HEENT: Normal, moist mucous membranes NECK: No JVD CARDIAC: irregularly irregular rhythm, normal S1 and S2, no rubs or gallops. Crisp mechanical click. VASCULAR: Radial and DP pulses 2+ bilaterally. No carotid bruits RESPIRATORY:  Clear to auscultation without rales, wheezing or rhonchi  ABDOMEN: Soft, non-tender, non-distended MUSCULOSKELETAL:  Ambulates independently SKIN: Warm and dry, no edema NEUROLOGIC:  Alert and oriented x 3. No focal neuro deficits noted. PSYCHIATRIC:  Normal affect    ASSESSMENT AND PLAN: .     Rheumatic mitral stenosis, now s/p mechanical mitral valve 27 mm 11/29/23 with Dr. Leafy Ro -continue aspirin for mechanical valve -continue coumadin, goal 2.5 -2.5, last 1.9, bridging with lovenox -reviewed vitamin K rich foods, eating a stable diet -discussed need for antibiotic prophylaxis before dental visits -now off metoprolol, rate in the 80s -reviewed red flag warning signs that need immediate medical attention  Cardiac rehab: ok to start cardiac rehab from my perspective. Has appt with Dr. Leafy Ro 01/04/24.   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 Limit salt. -recommend  moderate walking, 3-5 times/week for 30-50 minutes each session. Aim for at least 150 minutes.week. Goal should be pace of 3 miles/hours, or walking 1.5 miles in 30 minutes -recommend avoidance of tobacco products. Avoid excess alcohol.  Dispo: 4-6 mos or sooner as needed  Signed, Jodelle Red, MD   Jodelle Red, MD, PhD, [facility]  [facility]  [place]  Heart & Vascular at [facility] at [facility] [address] [place], [place] [address] [phone]

## 2023-12-27 NOTE — Patient Instructions (Signed)
 Description   Continue Lovenox 40mg  BID. Do not eat greens this week.  Take 2 tablets today, then start taking START taking 1.5 tablets every day except 2 tablets of Sundays, Tuesdays, and Thurdays.  Stay consistent with greens each week (2 per week)  Recheck INR on Monday. Coumadin Clinic [phone]

## 2023-12-27 NOTE — Patient Instructions (Signed)
 Medication Instructions:  Your physician recommends that you continue on your current medications as directed. Please refer to the Current Medication list given to you today.  If you plan on having dental surgery done, please call us at [phone] so we can prescribe antibiotics.  Follow-Up: At [facility], you and your health needs are our priority.  As part of our continuing mission to provide you with exceptional heart care, we have created designated Provider Care Teams.  These Care Teams include your primary Cardiologist (physician) and Advanced Practice Providers (APPs -  Physician Assistants and Nurse Practitioners) who all work together to provide you with the care you need, when you need it.  We recommend signing up for the patient portal called "MyChart".  Sign up information is provided on this After Visit Summary.  MyChart is used to connect with patients for Virtual Visits (Telemedicine).  Patients are able to view lab/test results, encounter notes, upcoming appointments, etc.  Non-urgent messages can be sent to your provider as well.   To learn more about what you can do with MyChart, go to [email or web address].    Your next appointment:   4 to 6 month(s)  Provider:   Jodelle Red, MD, Gillian Shields, NP or Eligha Bridegroom, NP

## 2024-01-01 ENCOUNTER — Ambulatory Visit: Attending: Cardiovascular Disease

## 2024-01-01 ENCOUNTER — Telehealth (HOSPITAL_BASED_OUTPATIENT_CLINIC_OR_DEPARTMENT_OTHER): Payer: Self-pay | Admitting: *Deleted

## 2024-01-01 DIAGNOSIS — Z7901 Long term (current) use of anticoagulants: Secondary | ICD-10-CM | POA: Diagnosis present

## 2024-01-01 DIAGNOSIS — Z8673 Personal history of transient ischemic attack (TIA), and cerebral infarction without residual deficits: Secondary | ICD-10-CM | POA: Insufficient documentation

## 2024-01-01 DIAGNOSIS — I4821 Permanent atrial fibrillation: Secondary | ICD-10-CM | POA: Diagnosis present

## 2024-01-01 LAB — POCT INR: INR: 1.8 — AB (ref 2.0–3.0)

## 2024-01-01 NOTE — Patient Instructions (Signed)
 Continue Lovenox 40mg  BID. Do not eat greens this week.  Take 2.5 tablets todayonly, then continue taking 1.5 tablets every day except 2 tablets of Sundays, Tuesdays, and Thurdays.  Stay consistent with greens each week (2 per week)  Recheck INR in 1 week. Coumadin Clinic [phone]

## 2024-01-01 NOTE — Telephone Encounter (Signed)
 This has not been received please have them refax [place] this information

## 2024-01-01 NOTE — Telephone Encounter (Signed)
 Copied from CRM [id number]. Topic: General - Other >> Jan 01, 2024 12:21 PM [place] B wrote: Reason for CRM: Myriam Jacobson from Home care delivered called in , states refxed doc on 03/ 7 that needs to be completed with info that was missing previously. Please cb if received

## 2024-01-02 ENCOUNTER — Ambulatory Visit: Payer: Medicaid Other

## 2024-01-03 NOTE — Progress Notes (Signed)
**Note Christy-Identified via Obfuscation**  [address]       [place] [address]             [phone]           Christy Poole [facility] Medical Record #[id number] Date of Birth: 1960/11/12  Christy Harbour, NP Christy Poole, Christy J, MD  Chief Complaint:     History of Present Illness:           Past Medical History:  Diagnosis Date   Atrial fibrillation [facility])    Coronary artery disease    s/p LAD stents   Diabetes mellitus without complication (HCC)    Dyspnea    Headache    HLD (hyperlipidemia)    Hypertension    Memory loss    due to stroke in 2024   Neuromuscular disorder (HCC)    feet   Pneumonia    x 1   Seizure (HCC)    last one was in 09/2023   Stroke [facility]) 2019   Thyroid disease    Walker as ambulation aid     Past Surgical History:  Procedure Laterality Date   MITRAL VALVE REPLACEMENT N/A 11/29/2023   Procedure: MITRAL VALVE (MV) REPLACEMENT USING ST JUDE MASTERS MECHANICAL VALVE;  Surgeon: Christy Hoes, MD;  Location: [facility] OR;  Service: Open Heart Surgery;  Laterality: N/A;   RIGHT/LEFT HEART CATH AND CORONARY ANGIOGRAPHY N/A 09/07/2023   Procedure: RIGHT/LEFT HEART CATH AND CORONARY ANGIOGRAPHY;  Surgeon: Christy Pyo, MD;  Location: [facility] INVASIVE CV LAB;  Service: Cardiovascular;  Laterality: N/A;   TEE WITHOUT CARDIOVERSION N/A 11/29/2023   Procedure: TRANSESOPHAGEAL ECHOCARDIOGRAM (TEE);  Surgeon: Christy Hoes, MD;  Location: [facility] OR;  Service: Open Heart Surgery;  Laterality: N/A;   TRANSESOPHAGEAL ECHOCARDIOGRAM (CATH LAB) N/A 10/03/2023   Procedure: TRANSESOPHAGEAL ECHOCARDIOGRAM;  Surgeon: Christy Si, MD;  Location: [facility] INVASIVE CV LAB;  Service: Cardiovascular;  Laterality: N/A;    Social History   Tobacco Use  Smoking Status Never   Passive exposure: Never  Smokeless Tobacco Never    Social History   Substance and Sexual Activity  Alcohol Use Never    Social History   Socioeconomic History   Marital status: Widowed    Spouse name: Not  on file   Number of children: Not on file   Years of education: Not on file   Highest education level: GED or equivalent  Occupational History   Not on file  Tobacco Use   Smoking status: Never    Passive exposure: Never   Smokeless tobacco: Never  Vaping Use   Vaping status: Never Used  Substance and Sexual Activity   Alcohol use: Never   Drug use: Never   Sexual activity: Not Currently    Birth control/protection: Post-menopausal  Other Topics Concern   Not on file  Social History Narrative   Not on file   Social Drivers of Health   Financial Resource Strain: Patient Declined (10/12/2023)   Overall Financial Resource Strain (CARDIA)    Difficulty of Paying Living Expenses: Patient declined  Recent Concern: Financial Resource Strain - High Risk (08/30/2023)   Overall Financial Resource Strain (CARDIA)    Difficulty of Paying Living Expenses: Hard  Food Insecurity: Patient Declined (10/12/2023)   Hunger Vital Sign    Worried About Running Out of Food in the Last Year: Patient declined    Ran Out of Food in the Last Year: Patient declined  Recent Concern: Food Insecurity -  Food Insecurity Present (08/30/2023)   Hunger Vital Sign    Worried About Running Out of Food in the Last Year: Often true    Ran Out of Food in the Last Year: Often true  Transportation Needs: Patient Declined (10/12/2023)   PRAPARE - Administrator, Civil Service (Medical): Patient declined    Lack of Transportation (Non-Medical): Patient declined  Recent Concern: Transportation Needs - Unmet Transportation Needs (08/30/2023)   PRAPARE - Administrator, Civil Service (Medical): Yes    Lack of Transportation (Non-Medical): Yes  Physical Activity: Inactive (08/30/2023)   Exercise Vital Sign    Days of Exercise per Week: 0 days    Minutes of Exercise per Session: 10 min  Stress: Stress Concern Present (08/30/2023)   [employer] of Occupational Health - Occupational Stress  Questionnaire    Feeling of Stress : Very much  Social Connections: Socially Isolated (08/30/2023)   Social Connection and Isolation Panel [NHANES]    Frequency of Communication with Friends and Family: Once a week    Frequency of Social Gatherings with Friends and Family: Three times a week    Attends Religious Services: Never    Active Member of Clubs or Organizations: No    Attends Banker Meetings: Not on file    Marital Status: Widowed  Intimate Partner Violence: Patient Unable To Answer (11/29/2023)   Humiliation, Afraid, Rape, and Kick questionnaire    Fear of Current or Ex-Partner: Patient unable to answer    Emotionally Abused: Patient unable to answer    Physically Abused: Patient unable to answer    Sexually Abused: Patient unable to answer    Allergies  Allergen Reactions   Other Other (See Comments)    Patient is vegetarian no meat can eat eggs    Current Outpatient Medications  Medication Sig Dispense Refill   acetaminophen (TYLENOL) 500 MG tablet Take 500 mg by mouth every 6 (six) hours as needed for mild pain (pain score 1-3), headache or fever.     albuterol (VENTOLIN HFA) 108 (90 Base) MCG/ACT inhaler Inhale 1-2 puffs into the lungs every 4 (four) hours as needed for wheezing or shortness of breath. 1 each 2   aspirin EC (ASPIRIN LOW DOSE) 81 MG tablet TAKE 1 TABLET (81 MG TOTAL) BY MOUTH DAILY. (SWALLOW WHOLE) 90 tablet 3   atorvastatin (LIPITOR) 80 MG tablet TAKE 1 TABLET (80 MG TOTAL) BY MOUTH DAILY. 90 tablet 3   cetaphil (CETAPHIL) lotion Apply 1 Application topically daily as needed for dry skin.     enoxaparin (LOVENOX) 40 MG/0.4ML injection Inject 0.4 mLs (40 mg total) into the skin every 12 (twelve) hours. 8 mL 1   gabapentin (NEURONTIN) 100 MG capsule Take 1 capsule (100 mg total) by mouth at bedtime. 60 capsule 1   levETIRAcetam (KEPPRA) 250 MG tablet Take 1 tablet (250 mg total) by mouth 2 (two) times daily. 180 tablet 3   levothyroxine  (SYNTHROID) 50 MCG tablet TAKE 1 TABLET (50 MCG TOTAL) BY MOUTH AT BEDTIME. 90 tablet 0   metFORMIN (GLUCOPHAGE-XR) 500 MG 24 hr tablet TAKE 1 TABLET (500 MG TOTAL) BY MOUTH DAILY WITH BREAKFAST. 90 tablet 1   mometasone-formoterol (DULERA) 100-5 MCG/ACT AERO Inhale 2 puffs into the lungs 2 (two) times daily as needed for wheezing or shortness of breath.     warfarin (COUMADIN) 2.5 MG tablet TAKE 1 TO 2 TABLETS DAILY OR AS PRESCRIBED BY COUMADIN CLINIC 60 tablet 2  No current facility-administered medications for this visit.     No family history on file.     Physical Exam:      Diagnostic Studies & Laboratory data: I have personally reviewed the following studies and agree with the findings     Recent Radiology Findings:       Recent Lab Findings: Lab Results  Component Value Date   WBC 11.9 (H) 12/06/2023   HGB 9.9 (L) 12/06/2023   HCT 29.4 (L) 12/06/2023   PLT 200 12/06/2023   GLUCOSE 102 (H) 12/06/2023   CHOL 104 12/01/2021   TRIG 84 12/01/2021   HDL 51 12/01/2021   LDLCALC 36 12/01/2021   ALT 30 11/27/2023   AST 36 11/27/2023   NA 132 (L) 12/06/2023   K 4.0 12/06/2023   CL 89 (L) 12/06/2023   CREATININE 0.71 12/06/2023   BUN 13 12/06/2023   CO2 28 12/06/2023   TSH 1.940 07/12/2023   INR 1.8 (A) 01/01/2024   HGBA1C 5.8 (H) 11/27/2023      Assessment / Plan:        I have spent *** min in review of the records, viewing studies and in face to face with patient and in coordination of future care    Christy Poole 01/03/2024 7:11 PM

## 2024-01-04 ENCOUNTER — Ambulatory Visit
Admission: RE | Admit: 2024-01-04 | Discharge: 2024-01-04 | Disposition: A | Discharge status: Home / Self Care | Source: Ambulatory Visit | Attending: Thoracic Surgery (Cardiothoracic Vascular Surgery) | Admitting: Thoracic Surgery (Cardiothoracic Vascular Surgery)

## 2024-01-04 ENCOUNTER — Encounter: Payer: Self-pay | Admitting: Physician Assistant

## 2024-01-04 ENCOUNTER — Ambulatory Visit: Payer: Self-pay | Admitting: Physician Assistant

## 2024-01-04 VITALS — BP 133/82 | HR 87 | Resp 18 | Ht 59.0 in | Wt 102.0 lb

## 2024-01-04 DIAGNOSIS — I050 Rheumatic mitral stenosis: Secondary | ICD-10-CM

## 2024-01-04 DIAGNOSIS — Z952 Presence of prosthetic heart valve: Secondary | ICD-10-CM

## 2024-01-04 NOTE — Patient Instructions (Signed)
 Continue taking your Coumadin and Lovenox injections as advised by the Coumadin Clinic.  You may gradually increase activity but continue to observe sternal precautions with no lifting, pushing, or pulling greater than 15 pounds.  He may begin cardiac rehab.  A referral was made today to the cardiac rehab service.  Follow-up with cardiac surgery as needed.

## 2024-01-10 ENCOUNTER — Ambulatory Visit (HOSPITAL_BASED_OUTPATIENT_CLINIC_OR_DEPARTMENT_OTHER): Payer: Medicaid Other | Admitting: Family Medicine

## 2024-01-10 ENCOUNTER — Ambulatory Visit: Attending: Cardiovascular Disease

## 2024-01-10 DIAGNOSIS — Z7901 Long term (current) use of anticoagulants: Secondary | ICD-10-CM | POA: Insufficient documentation

## 2024-01-10 DIAGNOSIS — Z8673 Personal history of transient ischemic attack (TIA), and cerebral infarction without residual deficits: Secondary | ICD-10-CM | POA: Insufficient documentation

## 2024-01-10 DIAGNOSIS — I4821 Permanent atrial fibrillation: Secondary | ICD-10-CM | POA: Diagnosis present

## 2024-01-10 LAB — POCT INR: INR: 2 (ref 2.0–3.0)

## 2024-01-10 NOTE — Patient Instructions (Signed)
 Description   Continue Lovenox 40mg  BID. Take 2.5 tablets today and 2.5 tablets tomorrow and then START taking 2 tabltes daily except 1.5 tablets every Friday.  Stay consistent with greens each week (2 per week)  Recheck INR in 1 week. Coumadin Clinic [phone]

## 2024-01-11 ENCOUNTER — Other Ambulatory Visit: Payer: Self-pay | Admitting: Physician Assistant

## 2024-01-11 ENCOUNTER — Other Ambulatory Visit (HOSPITAL_BASED_OUTPATIENT_CLINIC_OR_DEPARTMENT_OTHER): Payer: Self-pay | Admitting: Family Medicine

## 2024-01-11 ENCOUNTER — Ambulatory Visit (HOSPITAL_COMMUNITY)
Admission: RE | Admit: 2024-01-11 | Discharge: 2024-01-11 | Disposition: A | Discharge status: Home / Self Care | Payer: Medicaid Other | Source: Ambulatory Visit | Attending: Cardiovascular Disease | Admitting: Cardiovascular Disease

## 2024-01-11 DIAGNOSIS — Z952 Presence of prosthetic heart valve: Secondary | ICD-10-CM | POA: Insufficient documentation

## 2024-01-11 DIAGNOSIS — Z5181 Encounter for therapeutic drug level monitoring: Secondary | ICD-10-CM

## 2024-01-11 DIAGNOSIS — I4821 Permanent atrial fibrillation: Secondary | ICD-10-CM

## 2024-01-11 LAB — ECHOCARDIOGRAM COMPLETE
AR max vel: 1.22 cm2
AV Area VTI: 1.14 cm2
AV Area mean vel: 1.11 cm2
AV Mean grad: 4 mmHg
AV Peak grad: 6.7 mmHg
Ao pk vel: 1.3 m/s
Area-P 1/2: 4.8 cm2
MV VTI: 0.98 cm2
S' Lateral: 2.64 cm

## 2024-01-16 ENCOUNTER — Encounter (HOSPITAL_BASED_OUTPATIENT_CLINIC_OR_DEPARTMENT_OTHER): Payer: Self-pay | Admitting: Family Medicine

## 2024-01-16 ENCOUNTER — Encounter

## 2024-01-16 ENCOUNTER — Ambulatory Visit (INDEPENDENT_AMBULATORY_CARE_PROVIDER_SITE_OTHER): Admitting: Family Medicine

## 2024-01-16 ENCOUNTER — Ambulatory Visit: Attending: Cardiology | Admitting: Pharmacist

## 2024-01-16 VITALS — BP 126/84 | HR 93 | Ht 59.0 in | Wt 104.0 lb

## 2024-01-16 DIAGNOSIS — Z7901 Long term (current) use of anticoagulants: Secondary | ICD-10-CM | POA: Insufficient documentation

## 2024-01-16 DIAGNOSIS — E1122 Type 2 diabetes mellitus with diabetic chronic kidney disease: Secondary | ICD-10-CM

## 2024-01-16 DIAGNOSIS — N182 Chronic kidney disease, stage 2 (mild): Secondary | ICD-10-CM | POA: Diagnosis not present

## 2024-01-16 DIAGNOSIS — Z5181 Encounter for therapeutic drug level monitoring: Secondary | ICD-10-CM | POA: Insufficient documentation

## 2024-01-16 DIAGNOSIS — I4821 Permanent atrial fibrillation: Secondary | ICD-10-CM | POA: Insufficient documentation

## 2024-01-16 DIAGNOSIS — D649 Anemia, unspecified: Secondary | ICD-10-CM | POA: Diagnosis not present

## 2024-01-16 DIAGNOSIS — Z8673 Personal history of transient ischemic attack (TIA), and cerebral infarction without residual deficits: Secondary | ICD-10-CM | POA: Diagnosis present

## 2024-01-16 LAB — POCT INR: INR: 1.8 — AB (ref 2.0–3.0)

## 2024-01-16 NOTE — Assessment & Plan Note (Signed)
 Patient continues with metformin, tolerating medication well.  Recent A1c controlled, checked last month Can continue with current medication regimen.

## 2024-01-16 NOTE — Patient Instructions (Addendum)
 Description   Continue Lovenox 40mg  twice a day.  Take 3 tablets today and Thursday and take 2 tablets by mouth all other days of the week  Coumadin Clinic [phone]

## 2024-01-16 NOTE — Assessment & Plan Note (Signed)
 During hospitalization, patient did have notable anemia and did need to receive blood transfusion.  Currently doing well, does have mild symptoms, however no overt symptoms or clinical findings to suggest severe anemia currently.  We can proceed with labs today to assess current status including both CBC as well as checking iron storage and B12 and folate.  Further recommendations pending results of labs.

## 2024-01-16 NOTE — Progress Notes (Signed)
    Procedures performed today:    None.  Independent interpretation of notes and tests performed by another provider:   None.  Brief History, Exam, Impression, and Recommendations:    BP 126/84 (BP Location: Left Arm, Patient Position: Sitting, Cuff Size: Normal)   Pulse 93   Ht 4\' 11"  (1.499 m)   Wt 104 lb (47.2 kg)   SpO2 97%   BMI 21.01 kg/m   Patient and family do have questions about medication regimen.  They are unsure about whether patient should be taking metoprolol.  She was receiving this in the hospital, however on discharge, her paperwork work indicated to stop this medication and they are unclear as to if this is accurate.  She is currently not taking metoprolol. She does have home concerns related to anticoagulation with Coumadin and in particular is struggling with Lovenox as she reports that these injections are painful. Did review chart from recent hospitalization.  Discharge summary does indicate that patient is taking metoprolol and would suggest to continue with medication, however later within same discharge summary it does indicate that metoprolol is one of her medications to be stopped.  Certainly there is some confusion on whether patient should continue with metoprolol or not.  In office today, blood pressure is appropriate with normal heart rate, however is slightly at higher end of heart rate range.  Ultimately, do feel that she likely would be reasonable candidate to continue with beta-blocker.  I advised her and family to reach out to cardiology office and/or cardiothoracic surgeons office in order to review whether this is a medication that she should continue with.  Additionally, she was referred to cardiac rehab and it does appear that this is in process, however may still require further information from heart specialist.  Advised on checking with them regarding this as well  Type 2 diabetes mellitus with stage 2 chronic kidney disease, without long-term  current use of insulin (HCC) Assessment & Plan: Patient continues with metformin, tolerating medication well.  Recent A1c controlled, checked last month Can continue with current medication regimen.  Orders: -     Ambulatory referral to Ophthalmology -     Basic metabolic panel  Anemia, unspecified type Assessment & Plan: During hospitalization, patient did have notable anemia and did need to receive blood transfusion.  Currently doing well, does have mild symptoms, however no overt symptoms or clinical findings to suggest severe anemia currently.  We can proceed with labs today to assess current status including both CBC as well as checking iron storage and B12 and folate.  Further recommendations pending results of labs.  Orders: -     Basic metabolic panel -     CBC with Differential/Platelet -     Iron, TIBC and Ferritin Panel  Return in about 4 months (around 05/17/2024). 40  Spent 43 minutes on this patient encounter, including preparation, chart review, face-to-face counseling with patient and coordination of care, and documentation of encounter   ___________________________________________ Christy Valverde de [place], MD, ABFM, CAQSM Primary Care and Sports Medicine [facility]

## 2024-01-16 NOTE — Patient Instructions (Signed)
  Medication Instructions:  Your physician recommends that you continue on your current medications as directed. Please refer to the Current Medication list given to you today. --If you need a refill on any your medications before your next appointment, please call your pharmacy first. If no refills are authorized on file call the office.-- Lab Work: Your physician has recommended that you have lab work today: today If you have labs (blood work) drawn today and your tests are completely normal, you will receive your results via MyChart message OR a phone call from our staff.  Please ensure you check your voicemail in the event that you authorized detailed messages to be left on a delegated number. If you have any lab test that is abnormal or we need to change your treatment, we will call you to review the results.   Follow-Up: Your next appointment:   Your physician recommends that you schedule a follow-up appointment in: 4 month follow up  with Dr. de [place]  You will receive a text message or e-mail with a link to a survey about your care and experience with [place] today! We would greatly appreciate your feedback!   Thanks for letting us be apart of your health journey!!  Primary Care and Sports Medicine   Dr. Ceasar Mons [place]   We encourage you to activate your patient portal called "MyChart".  Sign up information is provided on this After Visit Summary.  MyChart is used to connect with patients for Virtual Visits (Telemedicine).  Patients are able to view lab/test results, encounter notes, upcoming appointments, etc.  Non-urgent messages can be sent to your provider as well. To learn more about what you can do with MyChart, please visit --  [email or web address].

## 2024-01-17 LAB — CBC WITH DIFFERENTIAL/PLATELET
Basophils Absolute: 0.1 10*3/uL (ref 0.0–0.2)
Basos: 1 %
EOS (ABSOLUTE): 0.2 10*3/uL (ref 0.0–0.4)
Eos: 3 %
Hematocrit: 37 % (ref 34.0–46.6)
Hemoglobin: 11.5 g/dL (ref 11.1–15.9)
Immature Grans (Abs): 0 10*3/uL (ref 0.0–0.1)
Immature Granulocytes: 0 %
Lymphocytes Absolute: 1.4 10*3/uL (ref 0.7–3.1)
Lymphs: 20 %
MCH: 28.2 pg (ref 26.6–33.0)
MCHC: 31.1 g/dL — ABNORMAL LOW (ref 31.5–35.7)
MCV: 91 fL (ref 79–97)
Monocytes Absolute: 0.6 10*3/uL (ref 0.1–0.9)
Monocytes: 9 %
Neutrophils Absolute: 4.6 10*3/uL (ref 1.4–7.0)
Neutrophils: 67 %
Platelets: 236 10*3/uL (ref 150–450)
RBC: 4.08 x10E6/uL (ref 3.77–5.28)
RDW: 12.4 % (ref 11.7–15.4)
WBC: 6.9 10*3/uL (ref 3.4–10.8)

## 2024-01-17 LAB — IRON,TIBC AND FERRITIN PANEL
Ferritin: 42 ng/mL (ref 15–150)
Iron Saturation: 12 % — ABNORMAL LOW (ref 15–55)
Iron: 50 ug/dL (ref 27–139)
Total Iron Binding Capacity: 433 ug/dL (ref 250–450)
UIBC: 383 ug/dL — ABNORMAL HIGH (ref 118–369)

## 2024-01-17 LAB — BASIC METABOLIC PANEL
BUN/Creatinine Ratio: 15 (ref 12–28)
BUN: 10 mg/dL (ref 8–27)
CO2: 21 mmol/L (ref 20–29)
Calcium: 10.2 mg/dL (ref 8.7–10.3)
Chloride: 103 mmol/L (ref 96–106)
Creatinine, Ser: 0.67 mg/dL (ref 0.57–1.00)
Glucose: 90 mg/dL (ref 70–99)
Potassium: 4.9 mmol/L (ref 3.5–5.2)
Sodium: 139 mmol/L (ref 134–144)
eGFR: 98 mL/min/{1.73_m2} (ref >59)

## 2024-01-23 ENCOUNTER — Ambulatory Visit: Attending: Cardiology

## 2024-01-23 DIAGNOSIS — I4821 Permanent atrial fibrillation: Secondary | ICD-10-CM | POA: Insufficient documentation

## 2024-01-23 DIAGNOSIS — Z7901 Long term (current) use of anticoagulants: Secondary | ICD-10-CM | POA: Insufficient documentation

## 2024-01-23 DIAGNOSIS — Z8673 Personal history of transient ischemic attack (TIA), and cerebral infarction without residual deficits: Secondary | ICD-10-CM | POA: Insufficient documentation

## 2024-01-23 LAB — POCT INR: INR: 3.1 — AB (ref 2.0–3.0)

## 2024-01-23 NOTE — Patient Instructions (Signed)
 Stop Lovenox Injections.  Continue 2 tablets Daily, except 3 tablets on Tuesday and Thursday.  INR in 3 weeks  Coumadin Clinic [phone]

## 2024-01-24 ENCOUNTER — Encounter

## 2024-01-29 ENCOUNTER — Ambulatory Visit: Payer: Medicaid Other | Admitting: Adult Health

## 2024-02-14 ENCOUNTER — Ambulatory Visit: Attending: Cardiovascular Disease

## 2024-02-14 DIAGNOSIS — I4821 Permanent atrial fibrillation: Secondary | ICD-10-CM | POA: Insufficient documentation

## 2024-02-14 DIAGNOSIS — Z7901 Long term (current) use of anticoagulants: Secondary | ICD-10-CM | POA: Insufficient documentation

## 2024-02-14 DIAGNOSIS — Z8673 Personal history of transient ischemic attack (TIA), and cerebral infarction without residual deficits: Secondary | ICD-10-CM | POA: Insufficient documentation

## 2024-02-14 LAB — POCT INR: INR: 2.7 (ref 2.0–3.0)

## 2024-02-14 IMAGING — CT CT HEAD W/O CM
3 series · 15 of 45 positions shown, 18 images · non-contrast
Comparison: None.

CLINICAL DATA: Delirium



[Series 3: head 5.0 h30s · axial · 0.36mm/px · z∈[+106,+221]mm · 9 of 28 slices shown, 12 images]
[im 3/28  brain]
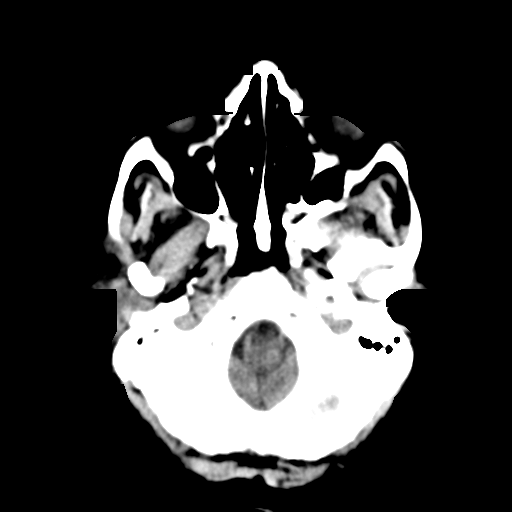
[im 3/28  bone]
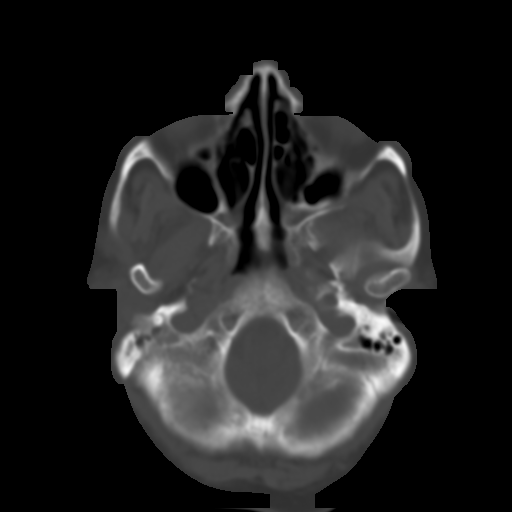
[im 6/28  brain]
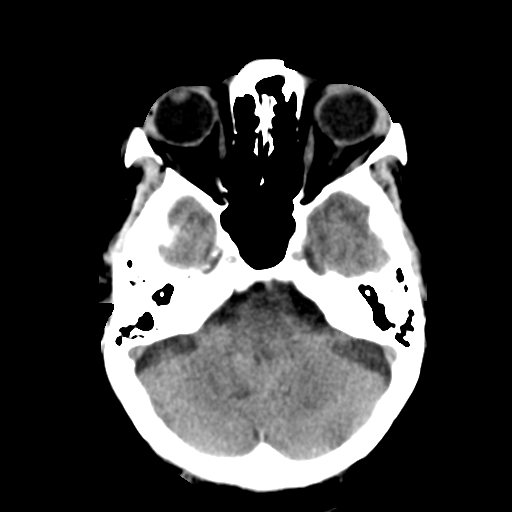
[im 9/28  brain]
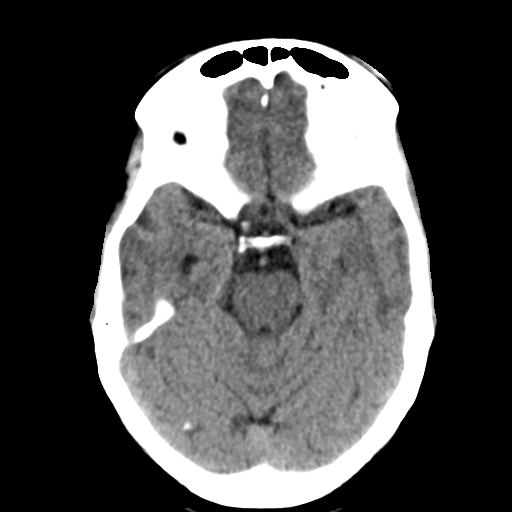
[im 12/28  brain]
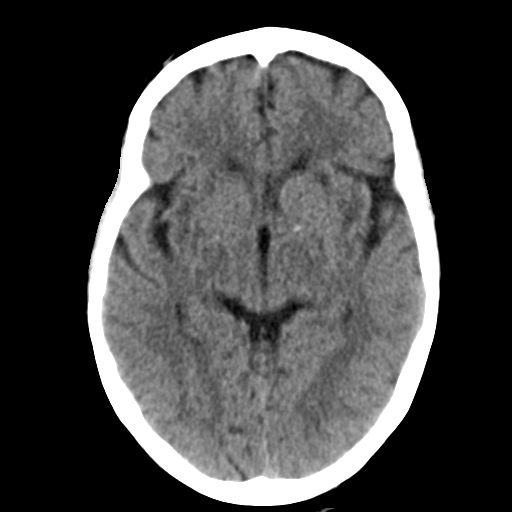
[im 15/28  brain]
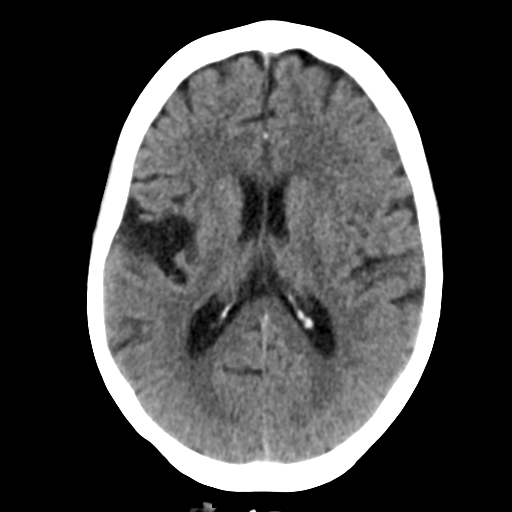
[im 15/28  bone]
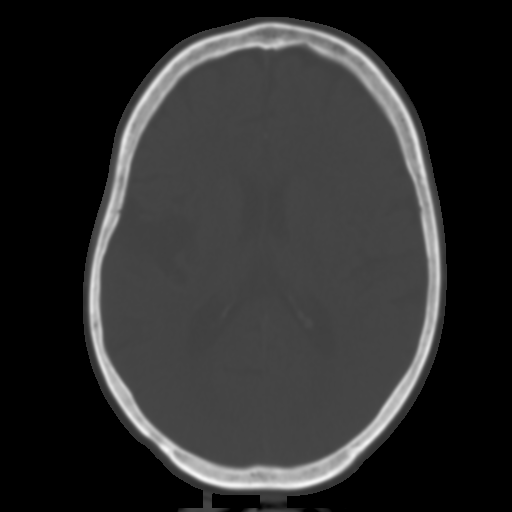
[im 17/28  brain]
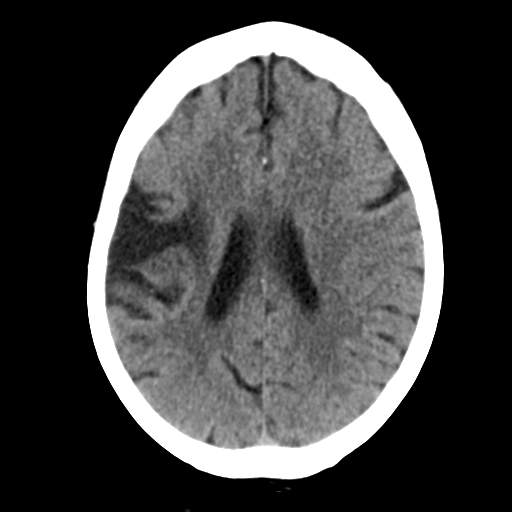
[im 20/28  brain]
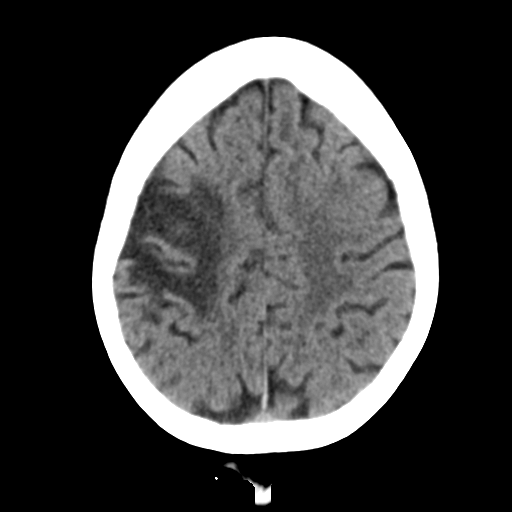
[im 23/28  brain]
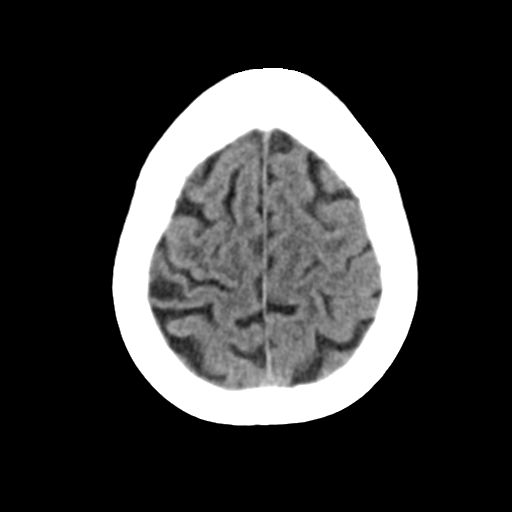
[im 26/28  brain]
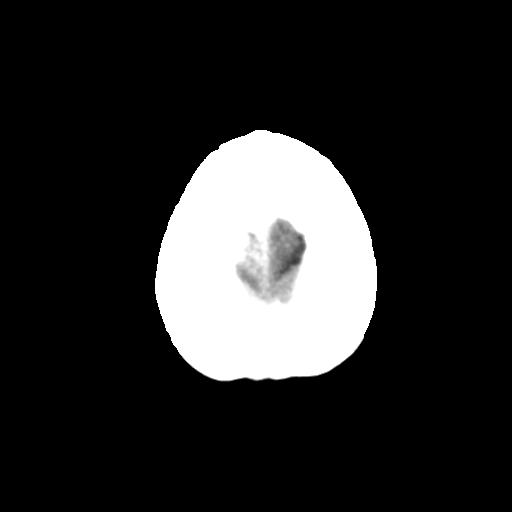
[im 26/28  bone]
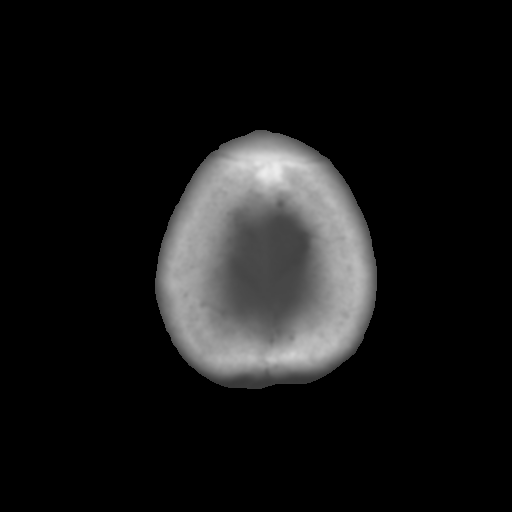

[Series 5: head 3.0 mpr cor · coronal · 0.26mm/px · 3 of 65 slices shown]
[im 22/65  brain]
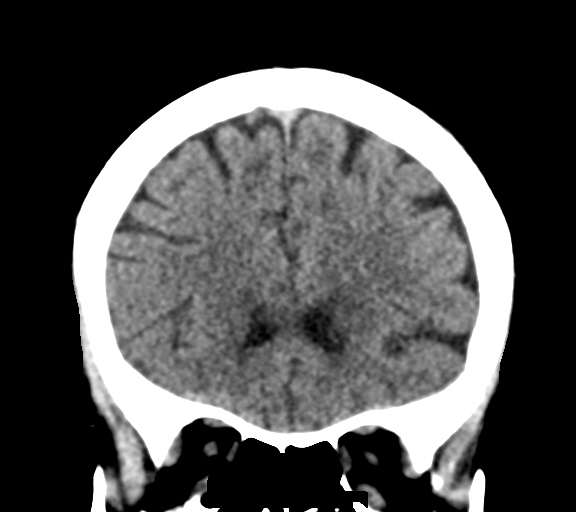
[im 29/65  brain]
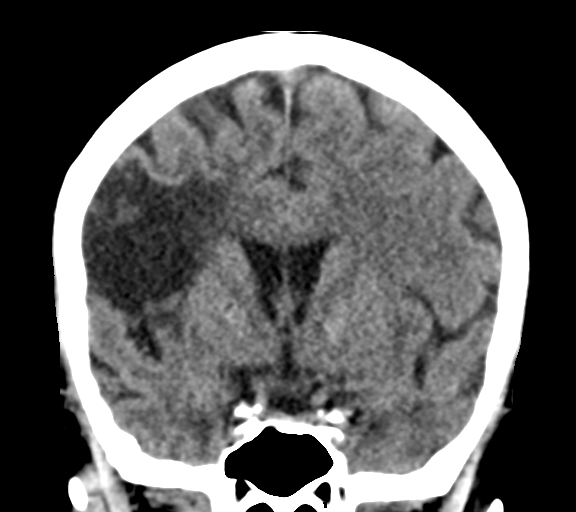
[im 36/65  brain]
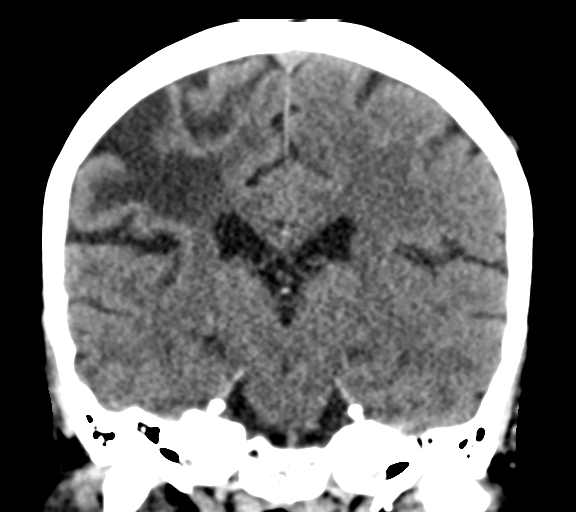

[Series 6: head 3.0 mpr sag · sagittal · 0.26mm/px · 3 of 49 slices shown]
[im 17/49  brain]
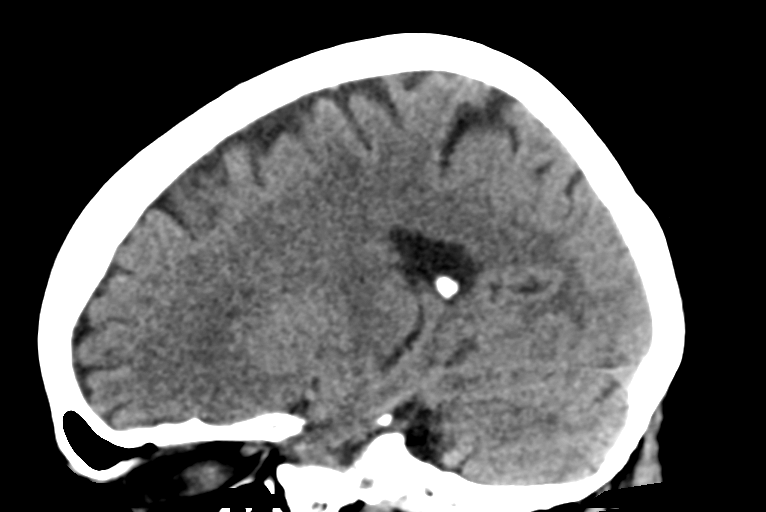
[im 25/49  brain]
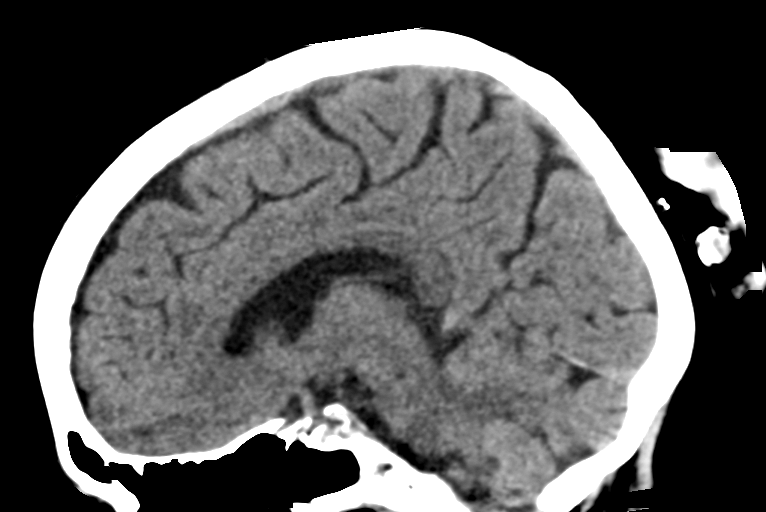
[im 33/49  brain]
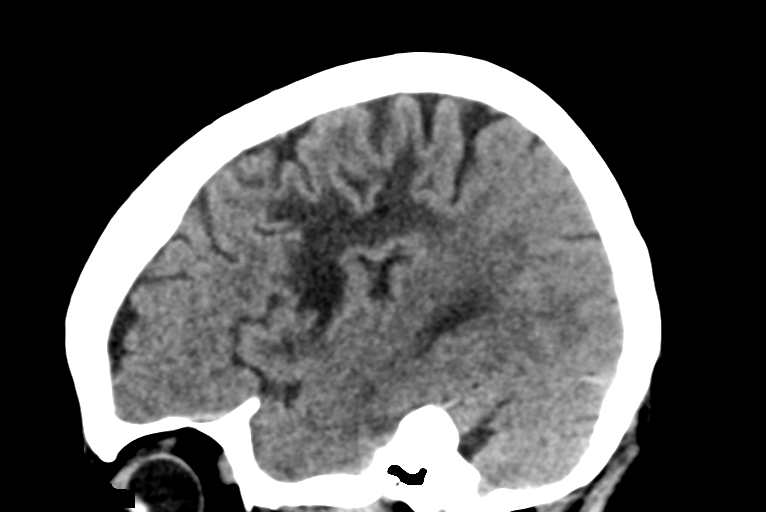

[15 of 45 positions shown; findings below may reference images not displayed]

FINDINGS: Brain: No acute territorial infarction, hemorrhage, or intracranial
mass. Moderate encephalomalacia within the right frontal lobe
consistent with remote infarct. Encephalomalacia involving right
insula and sub insula. Nonenlarged ventricles.

Vascular: No hyperdense vessels.  Carotid vascular calcification.

Skull: Normal. Negative for fracture or focal lesion.

Sinuses/Orbits: No acute finding.

Other: None
IMPRESSION: 1. No CT evidence for acute intracranial abnormality.
2. Remote right MCA infarct.

## 2024-02-14 NOTE — Patient Instructions (Addendum)
 Description   Continue 2 tablets Daily, except 3 tablets on Tuesday and Thursday.   Recheck INR in 4 weeks Coumadin  Clinic [phone]

## 2024-02-19 ENCOUNTER — Encounter (HOSPITAL_BASED_OUTPATIENT_CLINIC_OR_DEPARTMENT_OTHER): Payer: Self-pay | Admitting: Family Medicine

## 2024-02-29 ENCOUNTER — Telehealth (HOSPITAL_BASED_OUTPATIENT_CLINIC_OR_DEPARTMENT_OTHER): Payer: Self-pay

## 2024-02-29 NOTE — Telephone Encounter (Signed)
-----   Message from [facility] sent at 02/29/2024 10:23 AM EDT ----- I wasn't sure if CT surgery had called her about her echo, but her replaced mitral valve is functioning normally, and the rest of the heart looks good. Hope she is recovering well!

## 2024-02-29 NOTE — Telephone Encounter (Signed)
 Called and spoke to pt's son. Informed him that pt is to remain off of Metoprolol . Son informed that he has given pt Metoporlol and if this was an issue. Advised son to monitor pt's HR and stop giving Metoprolol  - if he has any further concerns to please contact office. He verbalized understanding - son also informed of upcoming appt.

## 2024-02-29 NOTE — Telephone Encounter (Signed)
 Called patient with help of interpreter, ID#: [id number]. Called and spoke to pt's son of echo results. Son concerned if pt still needs to take Metoprolol  - will f/u with Dr. Veryl Gottron and call back

## 2024-03-08 NOTE — Telephone Encounter (Signed)
 This is not a [facility] patient.  Please route this call the office listed in the CRM note below.   Copied from CRM [id number]. Topic: Appointments - Transfer of Care >> Mar 06, 2024  2:23 PM Retta Caster wrote: Pt is requesting to transfer FROM: Dr. Court Distance [place] Pt is requesting to transfer TO: Dr. Barbra Ley or anyone that will except patient Reason for requested transfer: Location is too far It is the responsibility of the team the patient would like to transfer to (Dr. Barbra Ley) to reach out to the patient if for any reason this transfer is not acceptable. Call back [phone] Son [name]

## 2024-03-13 ENCOUNTER — Ambulatory Visit: Attending: Cardiovascular Disease | Admitting: *Deleted

## 2024-03-13 DIAGNOSIS — Z7901 Long term (current) use of anticoagulants: Secondary | ICD-10-CM | POA: Diagnosis present

## 2024-03-13 DIAGNOSIS — I4821 Permanent atrial fibrillation: Secondary | ICD-10-CM | POA: Insufficient documentation

## 2024-03-13 DIAGNOSIS — Z8673 Personal history of transient ischemic attack (TIA), and cerebral infarction without residual deficits: Secondary | ICD-10-CM | POA: Insufficient documentation

## 2024-03-13 LAB — POCT INR: INR: 3.2 — AB (ref 2.0–3.0)

## 2024-03-13 NOTE — Patient Instructions (Signed)
 Description   Continue taking warfarin 2 tablets daily, except 3 tablets on Tuesday and Thursday.   Recheck INR in 5 weeks Coumadin  Clinic [phone]

## 2024-03-16 ENCOUNTER — Other Ambulatory Visit: Payer: Self-pay | Admitting: Cardiology

## 2024-03-16 DIAGNOSIS — I050 Rheumatic mitral stenosis: Secondary | ICD-10-CM

## 2024-03-16 DIAGNOSIS — I4821 Permanent atrial fibrillation: Secondary | ICD-10-CM

## 2024-03-19 NOTE — Telephone Encounter (Signed)
 Refill request for warfarin:  Last INR was 3.2 on 03/13/24 Next INR due 04/16/24 LOV was 12/27/23  Refill approved.

## 2024-04-10 ENCOUNTER — Telehealth (HOSPITAL_COMMUNITY): Payer: Self-pay

## 2024-04-10 NOTE — Telephone Encounter (Signed)
 Pt insurance is active and benefits verified through Medicare B. Co-pay $0, DED $0/$0 met, out of pocket $257/$10.53 met, co-insurance 20%. No pre-authorization required. [employer], 04/10/2024 @ 8:32am, REF# [id number].  How many CR sessions are covered? (36 visits for TCR, 72 visits for ICR)72 Is this a lifetime maximum or an annual maximum? Annual Has the member used any of these services to date? No Is there a time limit (weeks/months) on start of program and/or program completion? No  2ndary insurance is active and benefits verified through [facility]. Co-pay $0, DED $0/$0 met, out of pocket $0/$0 met, co-insurance 0%. No pre-authorization required. [employer] 04/10/2024 @ 8:35am, REF# [id number].

## 2024-04-10 NOTE — Telephone Encounter (Signed)
 Called to schedule patient for cardiac rehab- spoke with patient's son (release of information on file). Patient will come in for orientation on 7/01 and will attend the 6:45 exercise class.  Sent MyChart message.

## 2024-04-16 ENCOUNTER — Ambulatory Visit: Attending: Cardiovascular Disease | Admitting: *Deleted

## 2024-04-16 DIAGNOSIS — Z8673 Personal history of transient ischemic attack (TIA), and cerebral infarction without residual deficits: Secondary | ICD-10-CM | POA: Diagnosis present

## 2024-04-16 DIAGNOSIS — Z7901 Long term (current) use of anticoagulants: Secondary | ICD-10-CM | POA: Diagnosis present

## 2024-04-16 DIAGNOSIS — I4821 Permanent atrial fibrillation: Secondary | ICD-10-CM | POA: Insufficient documentation

## 2024-04-16 LAB — POCT INR: INR: 3.8 — AB (ref 2.0–3.0)

## 2024-04-16 NOTE — Patient Instructions (Signed)
 Description   Tomorrow take 1 tablet of warfarin (already taken today's dose) then continue taking warfarin 2 tablets daily, except 3 tablets on Tuesday and Thursday.   Recheck INR in 4 weeks Coumadin  Clinic [phone]

## 2024-04-16 NOTE — Progress Notes (Signed)
Please see anticoagulation encounter.

## 2024-04-17 ENCOUNTER — Other Ambulatory Visit (HOSPITAL_BASED_OUTPATIENT_CLINIC_OR_DEPARTMENT_OTHER): Payer: Self-pay | Admitting: Family Medicine

## 2024-04-17 ENCOUNTER — Encounter

## 2024-04-18 ENCOUNTER — Telehealth (HOSPITAL_BASED_OUTPATIENT_CLINIC_OR_DEPARTMENT_OTHER): Payer: Self-pay | Admitting: *Deleted

## 2024-04-18 NOTE — Telephone Encounter (Signed)
 Copied from CRM [id number]. Topic: Appointments - Appointment Cancel/Reschedule >> Apr 18, 2024 12:38 PM Silvana PARAS wrote: Pt's son calling due to cancelled appt on 6/25. It has been rescheduled for 7/23 at 8:45 ) CVT HVT). Pt is needing alternative dates and times without the warning solution appearing while I try to schedule. Number is [phone].

## 2024-04-18 NOTE — Telephone Encounter (Signed)
 Please call patient son and let him know he needs to reach out to cardiology regarding this appt

## 2024-04-22 ENCOUNTER — Encounter (HOSPITAL_BASED_OUTPATIENT_CLINIC_OR_DEPARTMENT_OTHER): Payer: Self-pay

## 2024-04-23 ENCOUNTER — Encounter (HOSPITAL_BASED_OUTPATIENT_CLINIC_OR_DEPARTMENT_OTHER): Payer: Self-pay | Admitting: Nurse Practitioner

## 2024-04-23 ENCOUNTER — Telehealth (HOSPITAL_BASED_OUTPATIENT_CLINIC_OR_DEPARTMENT_OTHER): Payer: Self-pay | Admitting: Family Medicine

## 2024-04-23 ENCOUNTER — Encounter (HOSPITAL_COMMUNITY)
Admission: RE | Admit: 2024-04-23 | Discharge: 2024-04-23 | Disposition: A | Discharge status: Home / Self Care | Source: Ambulatory Visit | Attending: Cardiology | Admitting: Cardiology

## 2024-04-23 ENCOUNTER — Ambulatory Visit (INDEPENDENT_AMBULATORY_CARE_PROVIDER_SITE_OTHER): Admitting: Nurse Practitioner

## 2024-04-23 VITALS — BP 98/60 | HR 63 | Ht 58.25 in | Wt 102.3 lb

## 2024-04-23 VITALS — BP 132/88 | HR 64 | Ht 59.0 in | Wt 100.0 lb

## 2024-04-23 DIAGNOSIS — D6869 Other thrombophilia: Secondary | ICD-10-CM

## 2024-04-23 DIAGNOSIS — I4821 Permanent atrial fibrillation: Secondary | ICD-10-CM | POA: Diagnosis not present

## 2024-04-23 DIAGNOSIS — R072 Precordial pain: Secondary | ICD-10-CM

## 2024-04-23 DIAGNOSIS — Z952 Presence of prosthetic heart valve: Secondary | ICD-10-CM | POA: Insufficient documentation

## 2024-04-23 DIAGNOSIS — I050 Rheumatic mitral stenosis: Secondary | ICD-10-CM

## 2024-04-23 DIAGNOSIS — Z8673 Personal history of transient ischemic attack (TIA), and cerebral infarction without residual deficits: Secondary | ICD-10-CM

## 2024-04-23 MED ORDER — METOPROLOL SUCCINATE ER 50 MG PO TB24: 25.0000 mg | ORAL_TABLET | Freq: Every day | ORAL | Status: DC

## 2024-04-23 NOTE — Telephone Encounter (Signed)
 Recieved Via fax Made Copies Attached billing form  placed in Providers Box Advised pt to allow 7-10 business days

## 2024-04-23 NOTE — Progress Notes (Signed)
 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  Reported prior cath without significant CAD.  Followed in [place]  and then heart from [facility] in [place].  History of TIA/CVA as well, initially 2019 and 11/2021.  Echo 11/2021 with moderate MS, Wilken score 7-8.  Permanent atrial fibrillation, on Coumadin .  She underwent mitral valve replacement 11/29/2023 with Dr. Maryjane, now has 27 mm Saint Jude mechanical mitral valve.  Also s/p LAA closure.  Last cardiology clinic visit 12/27/2023 with Dr. Bruckner.  She was on aspirin  and Coumadin  with INR goal 2.5-3.5.  She was off metoprolol  with heart rate in the 80s bpm.  History of seizures followed by neurology.  Echo 01/11/2024 revealed normal LVEF 55 to 60%, mildly enlarged RV, mild biatrial enlargement, normal structure and function of mitral valve prosthesis, no concerns.  CV risk factors: Diet- - primarily vegetarian, [place] food. No regular exercise regimen, Father had MI, brother had bypass surgery.   History of Present Illness Discussed the use of AI scribe software for clinical note transcription with the patient, who gave verbal consent to proceed. History of Present Illness Christy Poole is a very pleasant 63 year old female who is here with her son Christy Poole, who is interpreting for her. She went to cardiac rehab today for her initial intake and was noted to have irregular heart rhythm.  EKG reveals coarse A-fib with well-controlled ventricular rate.  She is asymptomatic. She experiences fatigue and minor chest pain, particularly during physical exertion.   Her family has limited her activities and she generally only performs light tasks such as cleaning the kitchen and playing with her granddaughter. She has shortness of breath at times.  She has difficulty falling asleep despite fatigue, but no chest pain or discomfort occurs during sleep. She denies orthopnea, PND, palpitations, edema, presyncope or syncope. No bleeding concerns. She has maintained consistent follow-up with our Coumadin  clinic.   ROS: See HPI  Studies Reviewed EKG Interpretation Date/Time:  Tuesday April 23 2024 10:13:05 EDT Ventricular Rate:  64 PR Interval:    QRS Duration:  68 QT Interval:  396 QTC Calculation: 408 R Axis:   118  Text Interpretation: Atrial fibrillation Right axis deviation Low voltage QRS Cannot rule out Anterior infarct , age undetermined When compared with ECG of 30-Nov-2023 06:52, Atrial fibrillation has replaced Sinus rhythm Nonspecific T wave abnormality, improved in Anterolateral leads Confirmed by Percy Browning [id number]) on 04/23/2024 4:45:48 PM     Lipoprotein (a)  Date/Time Value Ref Range Status  09/08/2023 02:41 AM 12.5 <75.0 nmol/L Final    Comment:    (NOTE) Note:  Values greater than or equal to 75.0 nmol/L may       indicate an independent risk factor for CHD,       but must be evaluated with caution when applied       to non-Caucasian populations due to the       influence of genetic factors on Lp(a) across       ethnicities. Performed At: [facility] [address] [place], [place] [id number] Jennette Shorter MD Ey:[phone]     Risk Assessment/Calculations  CHA2DS2-VASc Score = 6   This indicates a 9.7% annual risk of stroke. The patient's score is based upon: CHF History: 0 HTN History: 1 Diabetes History: 1 Stroke History: 2 Vascular Disease History: 1 Age Score: 0 Gender Score: 1            Physical Exam VS:  BP 132/88 (BP Location: Right Arm, Patient Position: Sitting, Cuff Size: Normal)   Pulse  64   Ht 4' 11 (1.499 m)   Wt 100 lb (45.4 kg)   SpO2 98%   BMI 20.20 kg/m    Wt Readings from Last 3 Encounters:  04/23/24 100 lb (45.4 kg)  04/23/24 102 lb 4.7 oz (46.4 kg)  01/16/24 104 lb (47.2 kg)    GEN: Well nourished, well developed in no acute distress NECK: No JVD; No carotid bruits CARDIAC: RRR, no murmurs, rubs, gallops RESPIRATORY:  Clear to auscultation without rales, wheezing or rhonchi  ABDOMEN: Soft, non-tender, non-distended EXTREMITIES:  No edema; No deformity    Assessment & Plan Atrial fibrillation on chronic anticoagulation/History of stroke Noted to have permanent atrial fibrillation, however EKG on 11/30/2023 revealed sinus rhythm.  HR is well-controlled.  Says she has valvular atrial fibrillation with mechanical valve, she will need to continue Coumadin  for stroke prevention.  Her presenting  metoprolol  at 25 mg daily. Consider home HR monitoring device for peace of mind. Encouraged gradual increase in physical activity and participation in cardiac rehab with heart rate and blood pressure monitoring.   Rheumatic mitral stenosis S/p mitral valve replacement S/p mechanical mitral valve 27 mm replacement 11/29/2023. On Coumadin , goal 2.5-3.5.  INR 3.8 on 04/16/2024. Her son acknowledges change in Coumadin  dosing and consistency with greens in her diet.  HR is well-controlled.  She has some shortness of breath with activity but no dyspnea, orthopnea, PND, or chest pain.  No acute concerns today.  She may proceed with cardiac rehab.  Chest pain   Minor chest pain occurs with activity. Symptoms not concerning for angina.  She had minimal nonobstructive coronary artery with patent mid LAD stents on cardiac cath 09/07/2023. Admits she has not been very active at home since surgery.  Encouraged gradual increase of regular activity while avoiding overexertion and participation in cardiac rehab.        Dispo: 1 month with me  Signed, Rosaline Bane, NP-C

## 2024-04-23 NOTE — Progress Notes (Signed)
 Cardiac Rehab Medication Review   Does the patient  feel that his/her medications are working for him/her?  yes  Has the patient been experiencing any side effects to the medications prescribed?  no  Does the patient measure his/her own blood pressure or blood glucose at home?  no   Does the patient have any problems obtaining medications due to transportation or finances?   no  Understanding of regimen: Per son, he dispenses her medications to her. Understanding of indications: good Potential of compliance: excellent    Comments: Per son, they only check blood pressure at [place] appointments and if symptoms. Doesn't check blood sugar at home. Doesn't have strips.    Arnoldo Gal 04/23/2024 8:53 AM

## 2024-04-23 NOTE — Patient Instructions (Signed)
 Medication Instructions:   RESTART Toprol  XL one half (0.5) tablet by mouth ( 25 mg) daily.  *If you need a refill on your cardiac medications before your next appointment, please call your pharmacy*  Lab Work:  None ordered.  If you have labs (blood work) drawn today and your tests are completely normal, you will receive your results only by: MyChart Message (if you have MyChart) OR A paper copy in the mail If you have any lab test that is abnormal or we need to change your treatment, we will call you to review the results.  Testing/Procedures:  None ordered.  Follow-Up: At [facility], you and your health needs are our priority.  As part of our continuing mission to provide you with exceptional heart care, our providers are all part of one team.  This team includes your primary Cardiologist (physician) and Advanced Practice Providers or APPs (Physician Assistants and Nurse Practitioners) who all work together to provide you with the care you need, when you need it.  Your next appointment:   1 month(s)  Provider:   Rosaline Bane, NP    We recommend signing up for the patient portal called MyChart.  Sign up information is provided on this After Visit Summary.  MyChart is used to connect with patients for Virtual Visits (Telemedicine).  Patients are able to view lab/test results, encounter notes, upcoming appointments, etc.  Non-urgent messages can be sent to your provider as well.   To learn more about what you can do with MyChart, go to [email or web address].

## 2024-04-23 NOTE — Progress Notes (Signed)
 Incomplete Session Note  Patient Details  Name: Christy Poole MRN: [id number] Date of Birth: 05-21-1961 Referring Provider:    Leone M Fidel did not complete her rehab session.  Patient here for cardiac rehab orientation. Patient appears to have an irregular rhythm. Heart rate 67.Patient asymptomatic.  Blood pressure 98/60. Oxygen saturation 99% on room air. Medications reviewed with the interpreter taking as prescribed.Will review today's ECG tracings with onsite provider Jackee Wyn Raddle NP. Patient is on warfarin has a history of Afib. The patient has an appointment today at 1005 to see Rosaline Bane NP.  ECG tracings shown to onsite provider Jackee Wyn NP. Jackee said that the patient may proceed with her 6 minute walk test due to time limits will reschedule. Patient was given a copy of today's ECG tracings for Rosaline Bane NP to review. Will reschedule walk test to this upcoming Thursday.Hadassah Elpidio Quan RN BSN

## 2024-04-25 ENCOUNTER — Encounter (HOSPITAL_BASED_OUTPATIENT_CLINIC_OR_DEPARTMENT_OTHER): Payer: Self-pay | Admitting: *Deleted

## 2024-04-25 ENCOUNTER — Encounter (HOSPITAL_COMMUNITY)
Admission: RE | Admit: 2024-04-25 | Discharge: 2024-04-25 | Disposition: A | Discharge status: Home / Self Care | Source: Ambulatory Visit | Attending: Cardiology | Admitting: Cardiology

## 2024-04-25 VITALS — BP 106/60 | HR 87 | Ht 58.25 in | Wt 100.3 lb

## 2024-04-25 DIAGNOSIS — Z952 Presence of prosthetic heart valve: Secondary | ICD-10-CM

## 2024-04-25 NOTE — Progress Notes (Signed)
 Cardiac Individual Treatment Plan  Patient Details  Name: Christy Poole MRN: [id number] Date of Birth: [birth year] Referring Provider:   Flowsheet Row INTENSIVE CARDIAC REHAB ORIENT from 04/25/2024 in [facility] Heart, V[facility], & [facility]  Referring Provider Shelda Bruckner, MD    Initial Encounter Date:  Flowsheet Row INTENSIVE CARDIAC REHAB ORIENT from 04/25/2024 in [facility] for Heart, Vascular, & Lung Health  Date 04/25/24    Visit Diagnosis: 11/28/23 MVR (mitral valve replacement)  Patient's Home Medications on Admission:  Current Outpatient Medications:    acetaminophen  (TYLENOL ) 500 MG tablet, Take 500 mg by mouth every 6 (six) hours as needed for mild pain (pain score 1-3), headache or fever., Disp: , Rfl:    albuterol  (VENTOLIN  HFA) 108 (90 Base) MCG/ACT inhaler, Inhale 1-2 puffs into the lungs every 4 (four) hours as needed for wheezing or shortness of breath., Disp: 1 each, Rfl: 2   aspirin  EC (ASPIRIN  LOW DOSE) 81 MG tablet, TAKE 1 TABLET (81 MG TOTAL) BY MOUTH DAILY. (SWALLOW WHOLE), Disp: 90 tablet, Rfl: 3   atorvastatin  (LIPITOR ) 80 MG tablet, TAKE 1 TABLET (80 MG TOTAL) BY MOUTH DAILY., Disp: 90 tablet, Rfl: 3   cetaphil (CETAPHIL) lotion, Apply 1 Application topically daily as needed for dry skin., Disp: , Rfl:    enoxaparin  (LOVENOX ) 40 MG/0.4ML injection, Inject 0.4 mLs (40 mg total) into the skin every 12 (twelve) hours., Disp: 8 mL, Rfl: 1   gabapentin  (NEURONTIN ) 100 MG capsule, Take 1 capsule (100 mg total) by mouth at bedtime., Disp: 60 capsule, Rfl: 1   levETIRAcetam  (KEPPRA ) 250 MG tablet, Take 1 tablet (250 mg total) by mouth 2 (two) times daily., Disp: 180 tablet, Rfl: 3   levothyroxine  (SYNTHROID ) 50 MCG tablet, TAKE 1 TABLET (50 MCG TOTAL) BY MOUTH AT BEDTIME., Disp: 90 tablet, Rfl: 0   metFORMIN  (GLUCOPHAGE -XR) 500 MG 24 hr tablet, TAKE 1 TABLET (500 MG TOTAL) BY MOUTH DAILY WITH BREAKFAST., Disp:  90 tablet, Rfl: 1   metoprolol  succinate (TOPROL -XL) 50 MG 24 hr tablet, Take 0.5 tablets (25 mg total) by mouth daily. Take with or immediately following a meal., Disp: , Rfl:    mometasone -formoterol  (DULERA ) 100-5 MCG/ACT AERO, Inhale 2 puffs into the lungs 2 (two) times daily as needed for wheezing or shortness of breath., Disp: , Rfl:    warfarin (COUMADIN ) 2.5 MG tablet, TAKE 2 TO 3 TABLETS DAILY OR AS PRESCRIBED BY COUMADIN  CLINIC, Disp: 70 tablet, Rfl: 2  Past Medical History: Past Medical History:  Diagnosis Date   Atrial fibrillation (HCC)    Coronary artery disease    s/p LAD stents   Diabetes mellitus without complication (HCC)    Dyspnea    Headache    HLD (hyperlipidemia)    Hypertension    Memory loss    due to stroke in 2024   Neuromuscular disorder (HCC)    feet   Pneumonia    x 1   Seizure (HCC)    last one was in 09/2023   Stroke [facility]) 2019   Thyroid disease    Walker as ambulation aid     Tobacco Use: Social History   Tobacco Use  Smoking Status Never   Passive exposure: Never  Smokeless Tobacco Never    Labs: Review Flowsheet  More data exists      Latest Ref Rng & Units 05/15/2023 09/07/2023 09/12/2023 11/27/2023 11/29/2023  Labs for ITP Cardiac and Pulmonary Rehab  Hemoglobin  A1c 4.8 - 5.6 % 6.6  - 7.0  5.8  -  PH, Arterial 7.35 - 7.45 - 7.443  - - 7.346  7.300  7.290  7.339  7.394  7.390   PCO2 arterial 32 - 48 mmHg - 39.1  - - 42.4  41.8  46.7  44.4  31.5  36.6   Bicarbonate 20.0 - 28.0 mmol/L - 28.1  28.2  26.8  - - 23.2  20.6  22.7  23.9  25.6  19.3  22.1   TCO2 22 - 32 mmol/L - 29  30  28   - - 25  22  24  25  25  26  27  20  24  22  23    Acid-base deficit 0.0 - 2.0 mmol/L - - - - 2.0  5.0  4.0  2.0  1.0  5.0  2.0   O2 Saturation % - 61  69  91  - - 94  93  97  100  84  100  100     Details       Multiple values from one day are sorted in reverse-chronological order         Capillary Blood Glucose: Lab Results  Component Value Date    GLUCAP 94 12/07/2023   GLUCAP 174 (H) 12/06/2023   GLUCAP 109 (H) 12/06/2023   GLUCAP 175 (H) 12/06/2023   GLUCAP 85 12/06/2023     Exercise Target Goals: Exercise Program Goal: Individual exercise prescription set using results from initial 6 min walk test and THRR while considering  patient's activity barriers and safety.   Exercise Prescription Goal: Initial exercise prescription builds to 30-45 minutes a day of aerobic activity, 2-3 days per week.  Home exercise guidelines will be given to patient during program as part of exercise prescription that the participant will acknowledge.  Activity Barriers & Risk Stratification:  Activity Barriers & Cardiac Risk Stratification - 04/23/24 0831       Activity Barriers & Cardiac Risk Stratification   Activity Barriers Other (comment);Assistive Device;Balance Concerns    Comments CVA- left facial/ left arm deficits. Numbness tingling in hands/ feet.    Cardiac Risk Stratification High          6 Minute Walk:  6 Minute Walk     Row Name 04/25/24 0932         6 Minute Walk   Phase Initial     Distance 1455 feet     Walk Time 6 minutes     # of Rest Breaks 0     MPH 2.76     METS 3.63     RPE 7     Perceived Dyspnea  1     VO2 Peak 12.71     Symptoms Yes (comment)     Comments SOB- resolved with rest. No pain.     Resting HR 87 bpm     Resting BP 106/60     Resting Oxygen Saturation  100 %     Exercise Oxygen Saturation  during 6 min walk 96 %     Max Ex. HR 114 bpm     Max Ex. BP 124/66     2 Minute Post BP 114/68        Oxygen Initial Assessment:   Oxygen Re-Evaluation:   Oxygen Discharge (Final Oxygen Re-Evaluation):   Initial Exercise Prescription:  Initial Exercise Prescription - 04/25/24 0900       Date of Initial Exercise RX and  Referring Provider   Date 04/25/24    Referring Provider Shelda Bruckner, MD    Expected Discharge Date 07/17/24      NuStep   Level 1    SPM 70    Minutes  30    METs 2      Prescription Details   Frequency (times per week) 3    Duration Progress to 30 minutes of continuous aerobic without signs/symptoms of physical distress      Intensity   THRR 40-80% of Max Heartrate 62-124    Ratings of Perceived Exertion 11-13    Perceived Dyspnea 0-4      Progression   Progression Continue progressive overload as per policy without signs/symptoms or physical distress.      Resistance Training   Training Prescription Yes    Weight 1    Reps 10-15          Perform Capillary Blood Glucose checks as needed.  Exercise Prescription Changes:   Exercise Comments:   Exercise Goals and Review:   Exercise Goals     Row Name 04/23/24 0831             Exercise Goals   Increase Physical Activity Yes       Intervention Provide advice, education, support and counseling about physical activity/exercise needs.;Develop an individualized exercise prescription for aerobic and resistive training based on initial evaluation findings, risk stratification, comorbidities and participant's personal goals.       Expected Outcomes Short Term: Attend rehab on a regular basis to increase amount of physical activity.;Long Term: Exercising regularly at least 3-5 days a week.;Long Term: Add in home exercise to make exercise part of routine and to increase amount of physical activity.       Increase Strength and Stamina Yes       Intervention Provide advice, education, support and counseling about physical activity/exercise needs.;Develop an individualized exercise prescription for aerobic and resistive training based on initial evaluation findings, risk stratification, comorbidities and participant's personal goals.       Expected Outcomes Short Term: Increase workloads from initial exercise prescription for resistance, speed, and METs.;Short Term: Perform resistance training exercises routinely during rehab and add in resistance training at home;Long Term: Improve  cardiorespiratory fitness, muscular endurance and strength as measured by increased METs and functional capacity ( )       Able to understand and use rate of perceived exertion (RPE) scale Yes       Intervention Provide education and explanation on how to use RPE scale       Expected Outcomes Long Term:  Able to use RPE to guide intensity level when exercising independently;Short Term: Able to use RPE daily in rehab to express subjective intensity level       Knowledge and understanding of Target Heart Rate Range (THRR) Yes       Intervention Provide education and explanation of THRR including how the numbers were predicted and where they are located for reference       Expected Outcomes Short Term: Able to state/look up THRR;Long Term: Able to use THRR to govern intensity when exercising independently;Short Term: Able to use daily as guideline for intensity in rehab       Able to check pulse independently Yes       Intervention Provide education and demonstration on how to check pulse in carotid and radial arteries.;Review the importance of being able to check your own pulse for safety during independent exercise  Expected Outcomes Short Term: Able to explain why pulse checking is important during independent exercise;Long Term: Able to check pulse independently and accurately       Understanding of Exercise Prescription Yes       Intervention Provide education, explanation, and written materials on patient's individual exercise prescription       Expected Outcomes Short Term: Able to explain program exercise prescription;Long Term: Able to explain home exercise prescription to exercise independently          Exercise Goals Re-Evaluation :   Discharge Exercise Prescription (Final Exercise Prescription Changes):   Nutrition:  Target Goals: Understanding of nutrition guidelines, daily intake of sodium 1500mg , cholesterol 200mg , calories 30% from fat and 7% or less from saturated fats,  daily to have 5 or more servings of fruits and vegetables.  Biometrics:  Pre Biometrics - 04/25/24 0930       Pre Biometrics   Waist Circumference 33 inches    Hip Circumference 36.25 inches    Waist to Hip Ratio 0.91 %    Triceps Skinfold 18 mm    % Body Fat 33 %    Grip Strength 4 kg    Flexibility 18 in    Single Leg Stand 3.63 seconds           Nutrition Therapy Plan and Nutrition Goals:   Nutrition Assessments:  MEDIFICTS Score Key: >=70 Need to make dietary changes  40-70 Heart Healthy Diet <= 40 Therapeutic Level Cholesterol Diet    Picture Your Plate Scores: <59 Unhealthy dietary pattern with much room for improvement. 41-50 Dietary pattern unlikely to meet recommendations for good health and room for improvement. 51-60 More healthful dietary pattern, with some room for improvement.  >60 Healthy dietary pattern, although there may be some specific behaviors that could be improved.    Nutrition Goals Re-Evaluation:   Nutrition Goals Re-Evaluation:   Nutrition Goals Discharge (Final Nutrition Goals Re-Evaluation):   Psychosocial: Target Goals: Acknowledge presence or absence of significant depression and/or stress, maximize coping skills, provide positive support system. Participant is able to verbalize types and ability to use techniques and skills needed for reducing stress and depression.  Initial Review & Psychosocial Screening:  Initial Psych Review & Screening - 04/23/24 0854       Initial Review   Current issues with Current Stress Concerns    Comments Concerned about her health and the impact/ burden on her family.      Family Dynamics   Good Support System? Yes    Comments She has good support from her son.      Barriers   Psychosocial barriers to participate in program The patient should benefit from training in stress management and relaxation.;Psychosocial barriers identified (see note)      Screening Interventions   Interventions  Encouraged to exercise;To provide support and resources with identified psychosocial needs;Provide feedback about the scores to participant    Expected Outcomes Short Term goal: Utilizing psychosocial counselor, staff and physician to assist with identification of specific Stressors or current issues interfering with healing process. Setting desired goal for each stressor or current issue identified.;Long Term Goal: Stressors or current issues are controlled or eliminated.;Short Term goal: Identification and review with participant of any Quality of Life or Depression concerns found by scoring the questionnaire.;Long Term goal: The participant improves quality of Life and PHQ9 Scores as seen by post scores and/or verbalization of changes          Quality of Life Scores:  Quality of Life - 04/23/24 1535       Quality of Life   Select Quality of Life      Quality of Life Scores   Health/Function Pre 24.86 %    Socioeconomic Pre 27 %    Psych/Spiritual Pre 30 %    Family Pre 30 %    GLOBAL Pre 27.1 %         Scores of 19 and below usually indicate a poorer quality of life in these areas.  A difference of  2-3 points is a clinically meaningful difference.  A difference of 2-3 points in the total score of the Quality of Life Index has been associated with significant improvement in overall quality of life, self-image, physical symptoms, and general health in studies assessing change in quality of life.  PHQ-9: Review Flowsheet  More data exists      04/23/2024 01/16/2024 11/06/2023 10/12/2023 09/18/2023  Depression screen PHQ 2/9  Decreased Interest 2 0 0 0 0  Down, Depressed, Hopeless 1 0 0 1 1  PHQ - 2 Score 3 0 0 1 1  Altered sleeping 1 0 - 1 0  Tired, decreased energy 2 0 - 3 3  Change in appetite 0 0 - 0 2  Feeling bad or failure about yourself  0 0 - 1 0  Trouble concentrating 0 0 - 1 1  Moving slowly or fidgety/restless 0 0 - 1 1  Suicidal thoughts 0 0 - 0 0  PHQ-9 Score 6 0 -  8 8  Difficult doing work/chores Not difficult at all Not difficult at all - Somewhat difficult Somewhat difficult   Interpretation of Total Score  Total Score Depression Severity:  1-4 = Minimal depression, 5-9 = Mild depression, 10-14 = Moderate depression, 15-19 = Moderately severe depression, 20-27 = Severe depression   Psychosocial Evaluation and Intervention:   Psychosocial Re-Evaluation:   Psychosocial Discharge (Final Psychosocial Re-Evaluation):   Vocational Rehabilitation: Provide vocational rehab assistance to qualifying candidates.   Vocational Rehab Evaluation & Intervention:  Vocational Rehab - 04/23/24 0856       Initial Vocational Rehab Evaluation & Intervention   Assessment shows need for Vocational Rehabilitation No      Vocational Rehab Re-Evaulation   Comments Retired, no VR needs.          Education: Education Goals: Education classes will be provided on a weekly basis, covering required topics. Participant will state understanding/return demonstration of topics presented.     Core Videos: Exercise    Move It!  Clinical staff conducted group or individual video education with verbal and written material and guidebook.  Patient learns the recommended Pritikin exercise program. Exercise with the goal of living a long, healthy life. Some of the health benefits of exercise include controlled diabetes, healthier blood pressure levels, improved cholesterol levels, improved heart and lung capacity, improved sleep, and better body composition. Everyone should speak with their doctor before starting or changing an exercise routine.  Biomechanical Limitations Clinical staff conducted group or individual video education with verbal and written material and guidebook.  Patient learns how biomechanical limitations can impact exercise and how we can mitigate and possibly overcome limitations to have an impactful and balanced exercise routine.  Body  Composition Clinical staff conducted group or individual video education with verbal and written material and guidebook.  Patient learns that body composition (ratio of muscle mass to fat mass) is a key component to assessing overall fitness, rather than body weight alone. Increased  fat mass, especially visceral belly fat, can put us  at increased risk for metabolic syndrome, type 2 diabetes, heart disease, and even death. It is recommended to combine diet and exercise (cardiovascular and resistance training) to improve your body composition. Seek guidance from your physician and exercise physiologist before implementing an exercise routine.  Exercise Action Plan Clinical staff conducted group or individual video education with verbal and written material and guidebook.  Patient learns the recommended strategies to achieve and enjoy long-term exercise adherence, including variety, self-motivation, self-efficacy, and positive decision making. Benefits of exercise include fitness, good health, weight management, more energy, better sleep, less stress, and overall well-being.  Medical   Heart Disease Risk Reduction Clinical staff conducted group or individual video education with verbal and written material and guidebook.  Patient learns our heart is our most vital organ as it circulates oxygen, nutrients, white blood cells, and hormones throughout the entire body, and carries waste away. Data supports a plant-based eating plan like the Pritikin Program for its effectiveness in slowing progression of and reversing heart disease. The video provides a number of recommendations to address heart disease.   Metabolic Syndrome and Belly Fat  Clinical staff conducted group or individual video education with verbal and written material and guidebook.  Patient learns what metabolic syndrome is, how it leads to heart disease, and how one can reverse it and keep it from coming back. You have metabolic syndrome if  you have 3 of the following 5 criteria: abdominal obesity, high blood pressure, high triglycerides, low HDL cholesterol, and high blood sugar.  Hypertension and Heart Disease Clinical staff conducted group or individual video education with verbal and written material and guidebook.  Patient learns that high blood pressure, or hypertension, is very common in the [place] . Hypertension is largely due to excessive salt intake, but other important risk factors include being overweight, physical inactivity, drinking too much alcohol, smoking, and not eating enough potassium from fruits and vegetables. High blood pressure is a leading risk factor for heart attack, stroke, congestive heart failure, dementia, kidney failure, and premature death. Long-term effects of excessive salt intake include stiffening of the arteries and thickening of heart muscle and organ damage. Recommendations include ways to reduce hypertension and the risk of heart disease.  Diseases of Our Time - Focusing on Diabetes Clinical staff conducted group or individual video education with verbal and written material and guidebook.  Patient learns why the best way to stop diseases of our time is prevention, through food and other lifestyle changes. Medicine (such as prescription pills and surgeries) is often only a Band-Aid on the problem, not a long-term solution. Most common diseases of our time include obesity, type 2 diabetes, hypertension, heart disease, and cancer. The Pritikin Program is recommended and has been proven to help reduce, reverse, and/or prevent the damaging effects of metabolic syndrome.  Nutrition   Overview of the Pritikin Eating Plan  Clinical staff conducted group or individual video education with verbal and written material and guidebook.  Patient learns about the Pritikin Eating Plan for disease risk reduction. The Pritikin Eating Plan emphasizes a wide variety of unrefined, minimally-processed  carbohydrates, like fruits, vegetables, whole grains, and legumes. Go, Caution, and Stop food choices are explained. Plant-based and lean animal proteins are emphasized. Rationale provided for low sodium intake for blood pressure control, low added sugars for blood sugar stabilization, and low added fats and oils for coronary artery disease risk reduction and weight management.  Calorie Density  Clinical staff conducted group or individual video education with verbal and written material and guidebook.  Patient learns about calorie density and how it impacts the Pritikin Eating Plan. Knowing the characteristics of the food you choose will help you decide whether those foods will lead to weight gain or weight loss, and whether you want to consume more or less of them. Weight loss is usually a side effect of the Pritikin Eating Plan because of its focus on low calorie-dense foods.  Label Reading  Clinical staff conducted group or individual video education with verbal and written material and guidebook.  Patient learns about the Pritikin recommended label reading guidelines and corresponding recommendations regarding calorie density, added sugars, sodium content, and whole grains.  Dining Out - Part 1  Clinical staff conducted group or individual video education with verbal and written material and guidebook.  Patient learns that restaurant meals can be sabotaging because they can be so high in calories, fat, sodium, and/or sugar. Patient learns recommended strategies on how to positively address this and avoid unhealthy pitfalls.  Facts on Fats  Clinical staff conducted group or individual video education with verbal and written material and guidebook.  Patient learns that lifestyle modifications can be just as effective, if not more so, as many medications for lowering your risk of heart disease. A Pritikin lifestyle can help to reduce your risk of inflammation and atherosclerosis (cholesterol  build-up, or plaque, in the artery walls). Lifestyle interventions such as dietary choices and physical activity address the cause of atherosclerosis. A review of the types of fats and their impact on blood cholesterol levels, along with dietary recommendations to reduce fat intake is also included.  Nutrition Action Plan  Clinical staff conducted group or individual video education with verbal and written material and guidebook.  Patient learns how to incorporate Pritikin recommendations into their lifestyle. Recommendations include planning and keeping personal health goals in mind as an important part of their success.  Healthy Mind-Set    Healthy Minds, Bodies, Hearts  Clinical staff conducted group or individual video education with verbal and written material and guidebook.  Patient learns how to identify when they are stressed. Video will discuss the impact of that stress, as well as the many benefits of stress management. Patient will also be introduced to stress management techniques. The way we think, act, and feel has an impact on our hearts.  How Our Thoughts Can Heal Our Hearts  Clinical staff conducted group or individual video education with verbal and written material and guidebook.  Patient learns that negative thoughts can cause depression and anxiety. This can result in negative lifestyle behavior and serious health problems. Cognitive behavioral therapy is an effective method to help control our thoughts in order to change and improve our emotional outlook.  Additional Videos:  Exercise    Improving Performance  Clinical staff conducted group or individual video education with verbal and written material and guidebook.  Patient learns to use a non-linear approach by alternating intensity levels and lengths of time spent exercising to help burn more calories and lose more body fat. Cardiovascular exercise helps improve heart health, metabolism, hormonal balance, blood sugar  control, and recovery from fatigue. Resistance training improves strength, endurance, balance, coordination, reaction time, metabolism, and muscle mass. Flexibility exercise improves circulation, posture, and balance. Seek guidance from your physician and exercise physiologist before implementing an exercise routine and learn your capabilities and proper form for all exercise.  Introduction to Yoga  Clinical staff conducted  group or individual video education with verbal and written material and guidebook.  Patient learns about yoga, a discipline of the coming together of mind, breath, and body. The benefits of yoga include improved flexibility, improved range of motion, better posture and core strength, increased lung function, weight loss, and positive self-image. Yoga's heart health benefits include lowered blood pressure, healthier heart rate, decreased cholesterol and triglyceride levels, improved immune function, and reduced stress. Seek guidance from your physician and exercise physiologist before implementing an exercise routine and learn your capabilities and proper form for all exercise.  Medical   Aging: Enhancing Your Quality of Life  Clinical staff conducted group or individual video education with verbal and written material and guidebook.  Patient learns key strategies and recommendations to stay in good physical health and enhance quality of life, such as prevention strategies, having an advocate, securing a Health Care Proxy and Power of Attorney, and keeping a list of medications and system for tracking them. It also discusses how to avoid risk for bone loss.  Biology of Weight Control  Clinical staff conducted group or individual video education with verbal and written material and guidebook.  Patient learns that weight gain occurs because we consume more calories than we burn (eating more, moving less). Even if your body weight is normal, you may have higher ratios of fat compared to  muscle mass. Too much body fat puts you at increased risk for cardiovascular disease, heart attack, stroke, type 2 diabetes, and obesity-related cancers. In addition to exercise, following the Pritikin Eating Plan can help reduce your risk.  Decoding Lab Results  Clinical staff conducted group or individual video education with verbal and written material and guidebook.  Patient learns that lab test reflects one measurement whose values change over time and are influenced by many factors, including medication, stress, sleep, exercise, food, hydration, pre-existing medical conditions, and more. It is recommended to use the knowledge from this video to become more involved with your lab results and evaluate your numbers to speak with your doctor.   Diseases of Our Time - Overview  Clinical staff conducted group or individual video education with verbal and written material and guidebook.  Patient learns that according to the CDC, 50% to 70% of chronic diseases (such as obesity, type 2 diabetes, elevated lipids, hypertension, and heart disease) are avoidable through lifestyle improvements including healthier food choices, listening to satiety cues, and increased physical activity.  Sleep Disorders Clinical staff conducted group or individual video education with verbal and written material and guidebook.  Patient learns how good quality and duration of sleep are important to overall health and well-being. Patient also learns about sleep disorders and how they impact health along with recommendations to address them, including discussing with a physician.  Nutrition  Dining Out - Part 2 Clinical staff conducted group or individual video education with verbal and written material and guidebook.  Patient learns how to plan ahead and communicate in order to maximize their dining experience in a healthy and nutritious manner. Included are recommended food choices based on the type of restaurant the patient  is visiting.   Fueling a Banker conducted group or individual video education with verbal and written material and guidebook.  There is a strong connection between our food choices and our health. Diseases like obesity and type 2 diabetes are very prevalent and are in large-part due to lifestyle choices. The Pritikin Eating Plan provides plenty of food and hunger-curbing satisfaction.  It is easy to follow, affordable, and helps reduce health risks.  Menu Workshop  Clinical staff conducted group or individual video education with verbal and written material and guidebook.  Patient learns that restaurant meals can sabotage health goals because they are often packed with calories, fat, sodium, and sugar. Recommendations include strategies to plan ahead and to communicate with the manager, chef, or server to help order a healthier meal.  Planning Your Eating Strategy  Clinical staff conducted group or individual video education with verbal and written material and guidebook.  Patient learns about the Pritikin Eating Plan and its benefit of reducing the risk of disease. The Pritikin Eating Plan does not focus on calories. Instead, it emphasizes high-quality, nutrient-rich foods. By knowing the characteristics of the foods, we choose, we can determine their calorie density and make informed decisions.  Targeting Your Nutrition Priorities  Clinical staff conducted group or individual video education with verbal and written material and guidebook.  Patient learns that lifestyle habits have a tremendous impact on disease risk and progression. This video provides eating and physical activity recommendations based on your personal health goals, such as reducing LDL cholesterol, losing weight, preventing or controlling type 2 diabetes, and reducing high blood pressure.  Vitamins and Minerals  Clinical staff conducted group or individual video education with verbal and written material  and guidebook.  Patient learns different ways to obtain key vitamins and minerals, including through a recommended healthy diet. It is important to discuss all supplements you take with your doctor.   Healthy Mind-Set    Smoking Cessation  Clinical staff conducted group or individual video education with verbal and written material and guidebook.  Patient learns that cigarette smoking and tobacco addiction pose a serious health risk which affects millions of people. Stopping smoking will significantly reduce the risk of heart disease, lung disease, and many forms of cancer. Recommended strategies for quitting are covered, including working with your doctor to develop a successful plan.  Culinary   Becoming a Set designer conducted group or individual video education with verbal and written material and guidebook.  Patient learns that cooking at home can be healthy, cost-effective, quick, and puts them in control. Keys to cooking healthy recipes will include looking at your recipe, assessing your equipment needs, planning ahead, making it simple, choosing cost-effective seasonal ingredients, and limiting the use of added fats, salts, and sugars.  Cooking - Breakfast and Snacks  Clinical staff conducted group or individual video education with verbal and written material and guidebook.  Patient learns how important breakfast is to satiety and nutrition through the entire day. Recommendations include key foods to eat during breakfast to help stabilize blood sugar levels and to prevent overeating at meals later in the day. Planning ahead is also a key component.  Cooking - Educational psychologist conducted group or individual video education with verbal and written material and guidebook.  Patient learns eating strategies to improve overall health, including an approach to cook more at home. Recommendations include thinking of animal protein as a side on your plate rather  than center stage and focusing instead on lower calorie dense options like vegetables, fruits, whole grains, and plant-based proteins, such as beans. Making sauces in large quantities to freeze for later and leaving the skin on your vegetables are also recommended to maximize your experience.  Cooking - Healthy Salads and Dressing Clinical staff conducted group or individual video education with verbal and written material  and guidebook.  Patient learns that vegetables, fruits, whole grains, and legumes are the foundations of the Pritikin Eating Plan. Recommendations include how to incorporate each of these in flavorful and healthy salads, and how to create homemade salad dressings. Proper handling of ingredients is also covered. Cooking - Soups and [employer] - Soups and Desserts Clinical staff conducted group or individual video education with verbal and written material and guidebook.  Patient learns that Pritikin soups and desserts make for easy, nutritious, and delicious snacks and meal components that are low in sodium, fat, sugar, and calorie density, while high in vitamins, minerals, and filling fiber. Recommendations include simple and healthy ideas for soups and desserts.   Overview     The Pritikin Solution Program Overview Clinical staff conducted group or individual video education with verbal and written material and guidebook.  Patient learns that the results of the Pritikin Program have been documented in more than 100 articles published in peer-reviewed journals, and the benefits include reducing risk factors for (and, in some cases, even reversing) high cholesterol, high blood pressure, type 2 diabetes, obesity, and more! An overview of the three key pillars of the Pritikin Program will be covered: eating well, doing regular exercise, and having a healthy mind-set.  WORKSHOPS  Exercise: Exercise Basics: Building Your Action Plan Clinical staff led group instruction and  group discussion with PowerPoint presentation and patient guidebook. To enhance the learning environment the use of posters, models and videos may be added. At the conclusion of this workshop, patients will comprehend the difference between physical activity and exercise, as well as the benefits of incorporating both, into their routine. Patients will understand the FITT (Frequency, Intensity, Time, and Type) principle and how to use it to build an exercise action plan. In addition, safety concerns and other considerations for exercise and cardiac rehab will be addressed by the presenter. The purpose of this lesson is to promote a comprehensive and effective weekly exercise routine in order to improve patients' overall level of fitness.   Managing Heart Disease: Your Path to a Healthier Heart Clinical staff led group instruction and group discussion with PowerPoint presentation and patient guidebook. To enhance the learning environment the use of posters, models and videos may be added.At the conclusion of this workshop, patients will understand the anatomy and physiology of the heart. Additionally, they will understand how Pritikin's three pillars impact the risk factors, the progression, and the management of heart disease.  The purpose of this lesson is to provide a high-level overview of the heart, heart disease, and how the Pritikin lifestyle positively impacts risk factors.  Exercise Biomechanics Clinical staff led group instruction and group discussion with PowerPoint presentation and patient guidebook. To enhance the learning environment the use of posters, models and videos may be added. Patients will learn how the structural parts of their bodies function and how these functions impact their daily activities, movement, and exercise. Patients will learn how to promote a neutral spine, learn how to manage pain, and identify ways to improve their physical movement in order to  promote healthy living. The purpose of this lesson is to expose patients to common physical limitations that impact physical activity. Participants will learn practical ways to adapt and manage aches and pains, and to minimize their effect on regular exercise. Patients will learn how to maintain good posture while sitting, walking, and lifting.  Balance Training and Fall Prevention  Clinical staff led group instruction and group discussion with PowerPoint presentation  and patient guidebook. To enhance the learning environment the use of posters, models and videos may be added. At the conclusion of this workshop, patients will understand the importance of their sensorimotor skills (vision, proprioception, and the vestibular system) in maintaining their ability to balance as they age. Patients will apply a variety of balancing exercises that are appropriate for their current level of function. Patients will understand the common causes for poor balance, possible solutions to these problems, and ways to modify their physical environment in order to minimize their fall risk. The purpose of this lesson is to teach patients about the importance of maintaining balance as they age and ways to minimize their risk of falling.  WORKSHOPS   Nutrition:  Fueling a Ship broker led group instruction and group discussion with PowerPoint presentation and patient guidebook. To enhance the learning environment the use of posters, models and videos may be added. Patients will review the foundational principles of the Pritikin Eating Plan and understand what constitutes a serving size in each of the food groups. Patients will also learn Pritikin-friendly foods that are better choices when away from home and review make-ahead meal and snack options. Calorie density will be reviewed and applied to three nutrition priorities: weight maintenance, weight loss, and weight gain. The purpose of this lesson is to  reinforce (in a group setting) the key concepts around what patients are recommended to eat and how to apply these guidelines when away from home by planning and selecting Pritikin-friendly options. Patients will understand how calorie density may be adjusted for different weight management goals.  Mindful Eating  Clinical staff led group instruction and group discussion with PowerPoint presentation and patient guidebook. To enhance the learning environment the use of posters, models and videos may be added. Patients will briefly review the concepts of the Pritikin Eating Plan and the importance of low-calorie dense foods. The concept of mindful eating will be introduced as well as the importance of paying attention to internal hunger signals. Triggers for non-hunger eating and techniques for dealing with triggers will be explored. The purpose of this lesson is to provide patients with the opportunity to review the basic principles of the Pritikin Eating Plan, discuss the value of eating mindfully and how to measure internal cues of hunger and fullness using the Hunger Scale. Patients will also discuss reasons for non-hunger eating and learn strategies to use for controlling emotional eating.  Targeting Your Nutrition Priorities Clinical staff led group instruction and group discussion with PowerPoint presentation and patient guidebook. To enhance the learning environment the use of posters, models and videos may be added. Patients will learn how to determine their genetic susceptibility to disease by reviewing their family history. Patients will gain insight into the importance of diet as part of an overall healthy lifestyle in mitigating the impact of genetics and other environmental insults. The purpose of this lesson is to provide patients with the opportunity to assess their personal nutrition priorities by looking at their family history, their own health history and current risk factors. Patients will  also be able to discuss ways of prioritizing and modifying the Pritikin Eating Plan for their highest risk areas  Menu  Clinical staff led group instruction and group discussion with PowerPoint presentation and patient guidebook. To enhance the learning environment the use of posters, models and videos may be added. Using menus brought in from [employer], or printed from [employer], patients will apply the Pritikin dining out guidelines that  were presented in the [employer] video. Patients will also be able to practice these guidelines in a variety of provided scenarios. The purpose of this lesson is to provide patients with the opportunity to practice hands-on learning of the Pritikin Dining Out guidelines with actual menus and practice scenarios.  Label Reading Clinical staff led group instruction and group discussion with PowerPoint presentation and patient guidebook. To enhance the learning environment the use of posters, models and videos may be added. Patients will review and discuss the Pritikin label reading guidelines presented in Pritikin's Label Reading Educational series video. Using fool labels brought in from local grocery stores and markets, patients will apply the label reading guidelines and determine if the packaged food meet the Pritikin guidelines. The purpose of this lesson is to provide patients with the opportunity to review, discuss, and practice hands-on learning of the Pritikin Label Reading guidelines with actual packaged food labels. Cooking School  Pritikin's [employer] are designed to teach patients ways to prepare quick, simple, and affordable recipes at home. The importance of nutrition's role in chronic disease risk reduction is reflected in its emphasis in the overall Pritikin program. By learning how to prepare essential core Pritikin Eating Plan recipes, patients will increase control over what they eat; be able to customize the  flavor of foods without the use of added salt, sugar, or fat; and improve the quality of the food they consume. By learning a set of core recipes which are easily assembled, quickly prepared, and affordable, patients are more likely to prepare more healthy foods at home. These workshops focus on convenient breakfasts, simple entres, side dishes, and desserts which can be prepared with minimal effort and are consistent with nutrition recommendations for cardiovascular risk reduction. Cooking [employer] are taught by a Armed forces logistics/support/administrative officer (RD) who has been trained by the [employer]. The chef or RD has a clear understanding of the importance of minimizing - if not completely eliminating - added fat, sugar, and sodium in recipes. Throughout the series of Cooking School Workshop sessions, patients will learn about healthy ingredients and efficient methods of cooking to build confidence in their capability to prepare    Cooking School weekly topics:  Adding Flavor- Sodium-Free  Fast and Healthy Breakfasts  Powerhouse Plant-Based Proteins  Satisfying Salads and Dressings  Simple Sides and Sauces  International Cuisine-Spotlight on the [employer] Zones  Delicious Desserts  Savory Soups  [employer] - Meals in a Astronomer Appetizers and Snacks  Comforting Weekend Breakfasts  One-Pot Wonders   Fast Evening Meals  Landscape architect Your Pritikin Plate  WORKSHOPS   Healthy Mindset (Psychosocial):  Focused Goals, Sustainable Changes Clinical staff led group instruction and group discussion with PowerPoint presentation and patient guidebook. To enhance the learning environment the use of posters, models and videos may be added. Patients will be able to apply effective goal setting strategies to establish at least one personal goal, and then take consistent, meaningful action toward that goal. They will learn to identify common barriers to achieving  personal goals and develop strategies to overcome them. Patients will also gain an understanding of how our mind-set can impact our ability to achieve goals and the importance of cultivating a positive and growth-oriented mind-set. The purpose of this lesson is to provide patients with a deeper understanding of how to set and achieve personal goals, as well as the tools and strategies needed to overcome common obstacles which  may arise along the way.  From Head to Heart: The Power of a Healthy Outlook  Clinical staff led group instruction and group discussion with PowerPoint presentation and patient guidebook. To enhance the learning environment the use of posters, models and videos may be added. Patients will be able to recognize and describe the impact of emotions and mood on physical health. They will discover the importance of self-care and explore self-care practices which may work for them. Patients will also learn how to utilize the 4 C's to cultivate a healthier outlook and better manage stress and challenges. The purpose of this lesson is to demonstrate to patients how a healthy outlook is an essential part of maintaining good health, especially as they continue their cardiac rehab journey.  Healthy Sleep for a Healthy Heart Clinical staff led group instruction and group discussion with PowerPoint presentation and patient guidebook. To enhance the learning environment the use of posters, models and videos may be added. At the conclusion of this workshop, patients will be able to demonstrate knowledge of the importance of sleep to overall health, well-being, and quality of life. They will understand the symptoms of, and treatments for, common sleep disorders. Patients will also be able to identify daytime and nighttime behaviors which impact sleep, and they will be able to apply these tools to help manage sleep-related challenges. The purpose of this lesson is to provide patients with a general  overview of sleep and outline the importance of quality sleep. Patients will learn about a few of the most common sleep disorders. Patients will also be introduced to the concept of "sleep hygiene," and discover ways to self-manage certain sleeping problems through simple daily behavior changes. Finally, the workshop will motivate patients by clarifying the links between quality sleep and their goals of heart-healthy living.   Recognizing and Reducing Stress Clinical staff led group instruction and group discussion with PowerPoint presentation and patient guidebook. To enhance the learning environment the use of posters, models and videos may be added. At the conclusion of this workshop, patients will be able to understand the types of stress reactions, differentiate between acute and chronic stress, and recognize the impact that chronic stress has on their health. They will also be able to apply different coping mechanisms, such as reframing negative self-talk. Patients will have the opportunity to practice a variety of stress management techniques, such as deep abdominal breathing, progressive muscle relaxation, and/or guided imagery.  The purpose of this lesson is to educate patients on the role of stress in their lives and to provide healthy techniques for coping with it.  Learning Barriers/Preferences:  Learning Barriers/Preferences - 04/23/24 0856       Learning Barriers/Preferences   Learning Barriers Language;Sight    Learning Preferences Individual Instruction          Education Topics:  Knowledge Questionnaire Score:  Knowledge Questionnaire Score - 04/23/24 1546       Knowledge Questionnaire Score   Pre Score 6/24 completed with interpreter          Core Components/Risk Factors/Patient Goals at Admission:  Personal Goals and Risk Factors at Admission - 04/23/24 0857       Core Components/Risk Factors/Patient Goals on Admission   Diabetes Yes    Intervention Provide  education about signs/symptoms and action to take for hypo/hyperglycemia.;Provide education about proper nutrition, including hydration, and aerobic/resistive exercise prescription along with prescribed medications to achieve blood glucose in normal ranges: Fasting glucose 65-99 mg/dL    Expected Outcomes  Short Term: Participant verbalizes understanding of the signs/symptoms and immediate care of hyper/hypoglycemia, proper foot care and importance of medication, aerobic/resistive exercise and nutrition plan for blood glucose control.;Long Term: Attainment of HbA1C < 7%.    Hypertension Yes    Intervention Provide education on lifestyle modifcations including regular physical activity/exercise, weight management, moderate sodium restriction and increased consumption of fresh fruit, vegetables, and low fat dairy, alcohol moderation, and smoking cessation.;Monitor prescription use compliance.    Expected Outcomes Short Term: Continued assessment and intervention until BP is < 140/23mm HG in hypertensive participants. < 130/61mm HG in hypertensive participants with diabetes, heart failure or chronic kidney disease.;Long Term: Maintenance of blood pressure at goal levels.    Lipids Yes    Intervention Provide education and support for participant on nutrition & aerobic/resistive exercise along with prescribed medications to achieve LDL 70mg , HDL >40mg .    Expected Outcomes Short Term: Participant states understanding of desired cholesterol values and is compliant with medications prescribed. Participant is following exercise prescription and nutrition guidelines.;Long Term: Cholesterol controlled with medications as prescribed, with individualized exercise RX and with personalized nutrition plan. Value goals: LDL < 70mg , HDL > 40 mg.          Core Components/Risk Factors/Patient Goals Review:    Core Components/Risk Factors/Patient Goals at Discharge (Final Review):    ITP Comments:  ITP Comments      Row Name 04/23/24 0801           ITP Comments Medical Director- Dr. Wilbert Bihari, MD. Introduction to the [facility]ensive [facility]iac [facility] Program. Review of initial orientation folder.          Comments: Participant attended orientation for the cardiac rehabilitation program on  04/25/2024  to perform exercise walk test. Completed paperwork and initial orientation packet on 04/23/24 and had to leave early for another appt. Pt returned today to complete measurements and . Vital signs stable. Telemetry- AFib, mildly symptomatic. Interpreter present for appt. Pt with SOB at end of , resolved with seated rest break. No other s/sx or pain.   Service time was from 0807 to 0900.  Con KATHEE Pereyra, MS, ACSM-CEP 04/25/2024 9:39 AM

## 2024-04-29 ENCOUNTER — Encounter (HOSPITAL_BASED_OUTPATIENT_CLINIC_OR_DEPARTMENT_OTHER): Payer: Self-pay | Admitting: *Deleted

## 2024-04-29 ENCOUNTER — Encounter (HOSPITAL_COMMUNITY)
Admission: RE | Admit: 2024-04-29 | Discharge: 2024-04-29 | Disposition: A | Discharge status: Home / Self Care | Source: Ambulatory Visit | Attending: Cardiology | Admitting: Cardiology

## 2024-04-29 DIAGNOSIS — Z952 Presence of prosthetic heart valve: Secondary | ICD-10-CM

## 2024-04-29 LAB — GLUCOSE, CAPILLARY
Glucose-Capillary: 101 mg/dL — ABNORMAL HIGH (ref 70–99)
Glucose-Capillary: 78 mg/dL (ref 70–99)

## 2024-04-29 NOTE — Progress Notes (Signed)
 Daily Session Note  Patient Details  Name: NALLELY YOST MRN: [id number] Date of Birth: 08/04/1961 Referring Provider:   Flowsheet Row INTENSIVE CARDIAC REHAB ORIENT from 04/25/2024 in [facility]  Referring Provider Shelda Bruckner, MD    Encounter Date: 04/29/2024  Check In:  Session Check In - 04/29/24 9292       Check-In   Supervising physician immediately available to respond to emergencies [facility] MD immediately available    Physician(s) Damien Braver, NP    Location [facility]    Staff Present Con Pereyra, MS, Exercise Physiologist;Kaylee Nicholaus, MS, ACSM-CEP, Exercise Physiologist;Joslynne Klatt Violetta, RN, MHA;Jetta Walker BS, ACSM-CEP, Exercise Physiologist;Johnny Fayette, MS, Exercise Physiologist;Other    Virtual Visit No    Medication changes reported     No    Fall or balance concerns reported    No    Tobacco Cessation No Change    Warm-up and Cool-down Performed as group-led instruction    Resistance Training Performed Yes    VAD Patient? No    PAD/SET Patient? No      Pain Assessment   Currently in Pain? No/denies    Pain Score 0-No pain    Multiple Pain Sites No          Capillary Blood Glucose: Results for orders placed or performed during the hospital encounter of 04/29/24 (from the past 24 hours)  Glucose, capillary     Status: Abnormal   Collection Time: 04/29/24  7:08 AM  Result Value Ref Range   Glucose-Capillary 101 (H) 70 - 99 mg/dL  Glucose, capillary     Status: None   Collection Time: 04/29/24  7:46 AM  Result Value Ref Range   Glucose-Capillary 78 70 - 99 mg/dL     Exercise Prescription Changes - 04/29/24 0818       Response to Exercise   Blood Pressure (Admit) 134/66    Blood Pressure (Exercise) 116/64    Blood Pressure (Exit) 126/70    Heart Rate (Admit) 87 bpm    Heart Rate (Exercise) 108 bpm    Heart Rate (Exit) 81 bpm    Rating of Perceived Exertion  (Exercise) 8    Perceived Dyspnea (Exercise) 0    Symptoms Lightheaded post Nustep, vital signs stable, Glucose lower 78, gave OJ    Comments Pt first day in the [facility] program    Duration Progress to 30 minutes of  aerobic without signs/symptoms of physical distress    Intensity THRR unchanged      Progression   Progression Continue to progress workloads to maintain intensity without signs/symptoms of physical distress.    Average METs 1.7      Resistance Training   Training Prescription No   Symptoms post Nustep no weight today   Weight 1    Reps 10-15    Time 10 Minutes      NuStep   Level 1    SPM 60    Minutes 26    METs 1.7          Social History   Tobacco Use  Smoking Status Never   Passive exposure: Never  Smokeless Tobacco Never    Goals Met:  Independence with exercise equipment  Goals Unmet:  Not Applicable  Comments: Pt in today for cardiac rehab. Pt exercised on the recumbent Nustep stepper today with reports of the exercise feeling light however also reported getting tired as the duration of  the exercise increased. Upon exiting the Nustep from a seated position to standing she reported feeling dizzy. Sat in chair, elevated legs, BP=106/60 water provided. CBG =78 and provided orange juice. Patient felt better BP recheck 138/78 and proceed to complete session with cool-down component. Telemetry-A-fib (chronic), asymptomatic. Medication list reconciled at orientation last week with no reports of medication changes today.  Pt oriented to exercise equipment and gym routine with [facility] interpreter present and patient's son present for support and encouragement.     Dr. Wilbert Bihari is Medical Director for Cardiac Rehab at [facility].

## 2024-05-01 ENCOUNTER — Encounter (HOSPITAL_COMMUNITY)
Admission: RE | Admit: 2024-05-01 | Discharge: 2024-05-01 | Disposition: A | Discharge status: Home / Self Care | Source: Ambulatory Visit | Attending: Cardiology | Admitting: Cardiology

## 2024-05-01 DIAGNOSIS — Z952 Presence of prosthetic heart valve: Secondary | ICD-10-CM

## 2024-05-01 LAB — GLUCOSE, CAPILLARY
Glucose-Capillary: 115 mg/dL — ABNORMAL HIGH (ref 70–99)
Glucose-Capillary: 90 mg/dL (ref 70–99)

## 2024-05-03 ENCOUNTER — Encounter (HOSPITAL_COMMUNITY)
Admission: RE | Admit: 2024-05-03 | Discharge: 2024-05-03 | Disposition: A | Discharge status: Home / Self Care | Source: Ambulatory Visit | Attending: Cardiology | Admitting: Cardiology

## 2024-05-03 DIAGNOSIS — Z952 Presence of prosthetic heart valve: Secondary | ICD-10-CM | POA: Diagnosis not present

## 2024-05-03 LAB — GLUCOSE, CAPILLARY
Glucose-Capillary: 92 mg/dL (ref 70–99)
Glucose-Capillary: 88 mg/dL (ref 70–99)

## 2024-05-06 ENCOUNTER — Encounter (HOSPITAL_COMMUNITY)
Admission: RE | Admit: 2024-05-06 | Discharge: 2024-05-06 | Disposition: A | Discharge status: Home / Self Care | Source: Ambulatory Visit | Attending: Cardiology | Admitting: Cardiology

## 2024-05-06 DIAGNOSIS — Z952 Presence of prosthetic heart valve: Secondary | ICD-10-CM | POA: Diagnosis not present

## 2024-05-06 LAB — GLUCOSE, CAPILLARY
Glucose-Capillary: 123 mg/dL — ABNORMAL HIGH (ref 70–99)
Glucose-Capillary: 92 mg/dL (ref 70–99)

## 2024-05-07 NOTE — Progress Notes (Signed)
 Cardiac Individual Treatment Plan  Patient Details  Name: Christy Poole MRN: [id number] Date of Birth: Oct 31, 1960 Referring Provider:   Flowsheet Row INTENSIVE CARDIAC REHAB ORIENT from 04/25/2024 in [facility]  Referring Provider Shelda Bruckner, MD    Initial Encounter Date:  Flowsheet Row INTENSIVE CARDIAC REHAB ORIENT from 04/25/2024 in [facility]  Date 04/25/24    Visit Diagnosis: 11/28/23 MVR (mitral valve replacement)  Patient's Home Medications on Admission:  Current Outpatient Medications:    acetaminophen  (TYLENOL ) 500 MG tablet, Take 500 mg by mouth every 6 (six) hours as needed for mild pain (pain score 1-3), headache or fever., Disp: , Rfl:    albuterol  (VENTOLIN  HFA) 108 (90 Base) MCG/ACT inhaler, Inhale 1-2 puffs into the lungs every 4 (four) hours as needed for wheezing or shortness of breath., Disp: 1 each, Rfl: 2   aspirin  EC (ASPIRIN  LOW DOSE) 81 MG tablet, TAKE 1 TABLET (81 MG TOTAL) BY MOUTH DAILY. (SWALLOW WHOLE), Disp: 90 tablet, Rfl: 3   atorvastatin  (LIPITOR ) 80 MG tablet, TAKE 1 TABLET (80 MG TOTAL) BY MOUTH DAILY., Disp: 90 tablet, Rfl: 3   cetaphil (CETAPHIL) lotion, Apply 1 Application topically daily as needed for dry skin., Disp: , Rfl:    enoxaparin  (LOVENOX ) 40 MG/0.4ML injection, Inject 0.4 mLs (40 mg total) into the skin every 12 (twelve) hours., Disp: 8 mL, Rfl: 1   gabapentin  (NEURONTIN ) 100 MG capsule, Take 1 capsule (100 mg total) by mouth at bedtime., Disp: 60 capsule, Rfl: 1   levETIRAcetam  (KEPPRA ) 250 MG tablet, Take 1 tablet (250 mg total) by mouth 2 (two) times daily., Disp: 180 tablet, Rfl: 3   levothyroxine  (SYNTHROID ) 50 MCG tablet, TAKE 1 TABLET (50 MCG TOTAL) BY MOUTH AT BEDTIME., Disp: 90 tablet, Rfl: 0   metFORMIN  (GLUCOPHAGE -XR) 500 MG 24 hr tablet, TAKE 1 TABLET (500 MG TOTAL) BY MOUTH DAILY WITH BREAKFAST., Disp:  90 tablet, Rfl: 1   metoprolol  succinate (TOPROL -XL) 50 MG 24 hr tablet, Take 0.5 tablets (25 mg total) by mouth daily. Take with or immediately following a meal., Disp: , Rfl:    mometasone -formoterol  (DULERA ) 100-5 MCG/ACT AERO, Inhale 2 puffs into the lungs 2 (two) times daily as needed for wheezing or shortness of breath., Disp: , Rfl:    warfarin (COUMADIN ) 2.5 MG tablet, TAKE 2 TO 3 TABLETS DAILY OR AS PRESCRIBED BY COUMADIN  CLINIC, Disp: 70 tablet, Rfl: 2  Past Medical History: Past Medical History:  Diagnosis Date   Atrial fibrillation (HCC)    Coronary artery disease    s/p LAD stents   Diabetes mellitus without complication (HCC)    Dyspnea    Headache    HLD (hyperlipidemia)    Hypertension    Memory loss    due to stroke in 2024   Neuromuscular disorder (HCC)    feet   Pneumonia    x 1   Seizure (HCC)    last one was in 09/2023   Stroke [facility]) 2019   Thyroid disease    Walker as ambulation aid     Tobacco Use: Social History   Tobacco Use  Smoking Status Never   Passive exposure: Never  Smokeless Tobacco Never    Labs: Review Flowsheet  More data exists      Latest Ref Rng & Units 05/15/2023 09/07/2023 09/12/2023 11/27/2023 11/29/2023  Labs for ITP Cardiac and Pulmonary Rehab  Hemoglobin  A1c 4.8 - 5.6 % 6.6  - 7.0  5.8  -  PH, Arterial 7.35 - 7.45 - 7.443  - - 7.346  7.300  7.290  7.339  7.394  7.390   PCO2 arterial 32 - 48 mmHg - 39.1  - - 42.4  41.8  46.7  44.4  31.5  36.6   Bicarbonate 20.0 - 28.0 mmol/L - 28.1  28.2  26.8  - - 23.2  20.6  22.7  23.9  25.6  19.3  22.1   TCO2 22 - 32 mmol/L - 29  30  28   - - 25  22  24  25  25  26  27  20  24  22  23    Acid-base deficit 0.0 - 2.0 mmol/L - - - - 2.0  5.0  4.0  2.0  1.0  5.0  2.0   O2 Saturation % - 61  69  91  - - 94  93  97  100  84  100  100     Details       Multiple values from one day are sorted in reverse-chronological order         Capillary Blood Glucose: Lab Results  Component Value Date    GLUCAP 92 05/06/2024   GLUCAP 123 (H) 05/06/2024   GLUCAP 88 05/03/2024   GLUCAP 92 05/03/2024   GLUCAP 90 05/01/2024     Exercise Target Goals: Exercise Program Goal: Individual exercise prescription set using results from initial 6 min walk test and THRR while considering  patient's activity barriers and safety.   Exercise Prescription Goal: Initial exercise prescription builds to 30-45 minutes a day of aerobic activity, 2-3 days per week.  Home exercise guidelines will be given to patient during program as part of exercise prescription that the participant will acknowledge.  Activity Barriers & Risk Stratification:  Activity Barriers & Cardiac Risk Stratification - 04/23/24 0831       Activity Barriers & Cardiac Risk Stratification   Activity Barriers Other (comment);Assistive Device;Balance Concerns    Comments CVA- left facial/ left arm deficits. Numbness tingling in hands/ feet.    Cardiac Risk Stratification High          6 Minute Walk:  6 Minute Walk     Row Name 04/25/24 0932         6 Minute Walk   Phase Initial     Distance 1455 feet     Walk Time 6 minutes     # of Rest Breaks 0     MPH 2.76     METS 3.63     RPE 7     Perceived Dyspnea  1     VO2 Peak 12.71     Symptoms Yes (comment)     Comments SOB- resolved with rest. No pain.     Resting HR 87 bpm     Resting BP 106/60     Resting Oxygen Saturation  100 %     Exercise Oxygen Saturation  during 6 min walk 96 %     Max Ex. HR 114 bpm     Max Ex. BP 124/66     2 Minute Post BP 114/68        Oxygen Initial Assessment:   Oxygen Re-Evaluation:   Oxygen Discharge (Final Oxygen Re-Evaluation):   Initial Exercise Prescription:  Initial Exercise Prescription - 04/25/24 0900       Date of Initial Exercise RX and Referring Provider  Date 04/25/24    Referring Provider Shelda Bruckner, MD    Expected Discharge Date 07/17/24      NuStep   Level 1    SPM 70    Minutes 30     METs 2      Prescription Details   Frequency (times per week) 3    Duration Progress to 30 minutes of continuous aerobic without signs/symptoms of physical distress      Intensity   THRR 40-80% of Max Heartrate 62-124    Ratings of Perceived Exertion 11-13    Perceived Dyspnea 0-4      Progression   Progression Continue progressive overload as per policy without signs/symptoms or physical distress.      Resistance Training   Training Prescription Yes    Weight 1    Reps 10-15          Perform Capillary Blood Glucose checks as needed.  Exercise Prescription Changes:   Exercise Prescription Changes     Row Name 04/29/24 0818             Response to Exercise   Blood Pressure (Admit) 134/66       Blood Pressure (Exercise) 116/64       Blood Pressure (Exit) 126/70       Heart Rate (Admit) 87 bpm       Heart Rate (Exercise) 108 bpm       Heart Rate (Exit) 81 bpm       Rating of Perceived Exertion (Exercise) 8       Perceived Dyspnea (Exercise) 0       Symptoms Lightheaded post Nustep, vital signs stable, Glucose lower 78, gave OJ       Comments Pt first day in the Pritikin ICR program       Duration Progress to 30 minutes of  aerobic without signs/symptoms of physical distress       Intensity THRR unchanged         Progression   Progression Continue to progress workloads to maintain intensity without signs/symptoms of physical distress.       Average METs 1.7         Resistance Training   Training Prescription No  Symptoms post Nustep no weight today       Weight 1       Reps 10-15       Time 10 Minutes         NuStep   Level 1       SPM 60       Minutes 26       METs 1.7          Exercise Comments:   Exercise Comments     Row Name 04/29/24 0825           Exercise Comments Pt first day in the Pritikin ICR program. Pt tolerated exercise fair with an agverage MET level of 1.7. Pt felt light headed post Nustep, glucose lower than start at 78, gave  OJ. No weights or cool down today.          Exercise Goals and Review:   Exercise Goals     Row Name 04/23/24 0831             Exercise Goals   Increase Physical Activity Yes       Intervention Provide advice, education, support and counseling about physical activity/exercise needs.;Develop an individualized exercise prescription for aerobic and resistive training based on initial evaluation  findings, risk stratification, comorbidities and participant's personal goals.       Expected Outcomes Short Term: Attend rehab on a regular basis to increase amount of physical activity.;Long Term: Exercising regularly at least 3-5 days a week.;Long Term: Add in home exercise to make exercise part of routine and to increase amount of physical activity.       Increase Strength and Stamina Yes       Intervention Provide advice, education, support and counseling about physical activity/exercise needs.;Develop an individualized exercise prescription for aerobic and resistive training based on initial evaluation findings, risk stratification, comorbidities and participant's personal goals.       Expected Outcomes Short Term: Increase workloads from initial exercise prescription for resistance, speed, and METs.;Short Term: Perform resistance training exercises routinely during rehab and add in resistance training at home;Long Term: Improve cardiorespiratory fitness, muscular endurance and strength as measured by increased METs and functional capacity ( )       Able to understand and use rate of perceived exertion (RPE) scale Yes       Intervention Provide education and explanation on how to use RPE scale       Expected Outcomes Long Term:  Able to use RPE to guide intensity level when exercising independently;Short Term: Able to use RPE daily in rehab to express subjective intensity level       Knowledge and understanding of Target Heart Rate Range (THRR) Yes       Intervention Provide education and  explanation of THRR including how the numbers were predicted and where they are located for reference       Expected Outcomes Short Term: Able to state/look up THRR;Long Term: Able to use THRR to govern intensity when exercising independently;Short Term: Able to use daily as guideline for intensity in rehab       Able to check pulse independently Yes       Intervention Provide education and demonstration on how to check pulse in carotid and radial arteries.;Review the importance of being able to check your own pulse for safety during independent exercise       Expected Outcomes Short Term: Able to explain why pulse checking is important during independent exercise;Long Term: Able to check pulse independently and accurately       Understanding of Exercise Prescription Yes       Intervention Provide education, explanation, and written materials on patient's individual exercise prescription       Expected Outcomes Short Term: Able to explain program exercise prescription;Long Term: Able to explain home exercise prescription to exercise independently          Exercise Goals Re-Evaluation :  Exercise Goals Re-Evaluation     Row Name 04/29/24 0823             Exercise Goal Re-Evaluation   Exercise Goals Review Increase Physical Activity;Understanding of Exercise Prescription;Increase Strength and Stamina;Knowledge and understanding of Target Heart Rate Range (THRR);Able to understand and use rate of perceived exertion (RPE) scale       Comments Pt first day in the Pritikin ICR program. Pt tolerated exercise fair with an agverage MET level of 1.7. Pt felt light headed post Nustep, glucose lower than start at 78, gave OJ. No weights or cool down today.       Expected Outcomes Will continue to monitor pt          Discharge Exercise Prescription (Final Exercise Prescription Changes):  Exercise Prescription Changes - 04/29/24 0818  Response to Exercise   Blood Pressure (Admit) 134/66     Blood Pressure (Exercise) 116/64    Blood Pressure (Exit) 126/70    Heart Rate (Admit) 87 bpm    Heart Rate (Exercise) 108 bpm    Heart Rate (Exit) 81 bpm    Rating of Perceived Exertion (Exercise) 8    Perceived Dyspnea (Exercise) 0    Symptoms Lightheaded post Nustep, vital signs stable, Glucose lower 78, gave OJ    Comments Pt first day in the Pritikin ICR program    Duration Progress to 30 minutes of  aerobic without signs/symptoms of physical distress    Intensity THRR unchanged      Progression   Progression Continue to progress workloads to maintain intensity without signs/symptoms of physical distress.    Average METs 1.7      Resistance Training   Training Prescription No   Symptoms post Nustep no weight today   Weight 1    Reps 10-15    Time 10 Minutes      NuStep   Level 1    SPM 60    Minutes 26    METs 1.7          Nutrition:  Target Goals: Understanding of nutrition guidelines, daily intake of sodium 1500mg , cholesterol 200mg , calories 30% from fat and 7% or less from saturated fats, daily to have 5 or more servings of fruits and vegetables.  Biometrics:  Pre Biometrics - 04/25/24 0930       Pre Biometrics   Waist Circumference 33 inches    Hip Circumference 36.25 inches    Waist to Hip Ratio 0.91 %    Triceps Skinfold 18 mm    % Body Fat 33 %    Grip Strength 4 kg    Flexibility 18 in    Single Leg Stand 3.63 seconds           Nutrition Therapy Plan and Nutrition Goals:  Nutrition Therapy & Goals - 04/29/24 0856       Nutrition Therapy   Diet Heart Healthy Diet    Drug/Food Interactions Statins/Certain Fruits;Coumadin /Vit K      Personal Nutrition Goals   Nutrition Goal Patient to improve diet quality by using the plate method as a guide for meal planning to include lean protein/plant protein, fruits, vegetables, whole grains, nonfat dairy as part of a well-balanced diet.    Comments Patient has medical history of s/p MVR 11/28/23,  TIA/CVA, afib, DM2. A1c is well controlled in a pre-diabetic range. Most recent lipid panel from 2023 WNL, LDL at goal. She continues regular follow-up with coumadin  clinic. Patient will benefit from participation in intensive cardiac rehab for nutrition education, exercise, and lifestyle modification.      Intervention Plan   Intervention Prescribe, educate and counsel regarding individualized specific dietary modifications aiming towards targeted core components such as weight, hypertension, lipid management, diabetes, heart failure and other comorbidities.;Nutrition handout(s) given to patient.    Expected Outcomes Short Term Goal: Understand basic principles of dietary content, such as calories, fat, sodium, cholesterol and nutrients.;Long Term Goal: Adherence to prescribed nutrition plan.          Nutrition Assessments:  MEDIFICTS Score Key: >=70 Need to make dietary changes  40-70 Heart Healthy Diet <= 40 Therapeutic Level Cholesterol Diet    Picture Your Plate Scores: <59 Unhealthy dietary pattern with much room for improvement. 41-50 Dietary pattern unlikely to meet recommendations for good health and room for improvement. 51-60  More healthful dietary pattern, with some room for improvement.  >60 Healthy dietary pattern, although there may be some specific behaviors that could be improved.    Nutrition Goals Re-Evaluation:  Nutrition Goals Re-Evaluation     Row Name 04/29/24 0856             Goals   Current Weight 100 lb 5 oz (45.5 kg)       Comment A1c 5.8, Lpa WNL, lipid panel from 2023 WNL       Expected Outcome Patient has medical history of s/p MVR 11/28/23, TIA/CVA, afib, DM2. A1c is well controlled in a pre-diabetic range. Most recent lipid panel from 2023 WNL, LDL at goal. She continues regular follow-up with coumadin  clinic. Patient will benefit from participation in intensive cardiac rehab for nutrition education, exercise, and lifestyle modification.           Nutrition Goals Re-Evaluation:  Nutrition Goals Re-Evaluation     Row Name 04/29/24 0856             Goals   Current Weight 100 lb 5 oz (45.5 kg)       Comment A1c 5.8, Lpa WNL, lipid panel from 2023 WNL       Expected Outcome Patient has medical history of s/p MVR 11/28/23, TIA/CVA, afib, DM2. A1c is well controlled in a pre-diabetic range. Most recent lipid panel from 2023 WNL, LDL at goal. She continues regular follow-up with coumadin  clinic. Patient will benefit from participation in intensive cardiac rehab for nutrition education, exercise, and lifestyle modification.          Nutrition Goals Discharge (Final Nutrition Goals Re-Evaluation):  Nutrition Goals Re-Evaluation - 04/29/24 0856       Goals   Current Weight 100 lb 5 oz (45.5 kg)    Comment A1c 5.8, Lpa WNL, lipid panel from 2023 WNL    Expected Outcome Patient has medical history of s/p MVR 11/28/23, TIA/CVA, afib, DM2. A1c is well controlled in a pre-diabetic range. Most recent lipid panel from 2023 WNL, LDL at goal. She continues regular follow-up with coumadin  clinic. Patient will benefit from participation in intensive cardiac rehab for nutrition education, exercise, and lifestyle modification.          Psychosocial: Target Goals: Acknowledge presence or absence of significant depression and/or stress, maximize coping skills, provide positive support system. Participant is able to verbalize types and ability to use techniques and skills needed for reducing stress and depression.  Initial Review & Psychosocial Screening:  Initial Psych Review & Screening - 04/23/24 0854       Initial Review   Current issues with Current Stress Concerns    Comments Concerned about her health and the impact/ burden on her family.      Family Dynamics   Good Support System? Yes    Comments She has good support from her son.      Barriers   Psychosocial barriers to participate in program The patient should benefit from  training in stress management and relaxation.;Psychosocial barriers identified (see note)      Screening Interventions   Interventions Encouraged to exercise;To provide support and resources with identified psychosocial needs;Provide feedback about the scores to participant    Expected Outcomes Short Term goal: Utilizing psychosocial counselor, staff and physician to assist with identification of specific Stressors or current issues interfering with healing process. Setting desired goal for each stressor or current issue identified.;Long Term Goal: Stressors or current issues are controlled or eliminated.;Short Term goal:  Identification and review with participant of any Quality of Life or Depression concerns found by scoring the questionnaire.;Long Term goal: The participant improves quality of Life and PHQ9 Scores as seen by post scores and/or verbalization of changes          Quality of Life Scores:  Quality of Life - 04/23/24 1535       Quality of Life   Select Quality of Life      Quality of Life Scores   Health/Function Pre 24.86 %    Socioeconomic Pre 27 %    Psych/Spiritual Pre 30 %    Family Pre 30 %    GLOBAL Pre 27.1 %         Scores of 19 and below usually indicate a poorer quality of life in these areas.  A difference of  2-3 points is a clinically meaningful difference.  A difference of 2-3 points in the total score of the Quality of Life Index has been associated with significant improvement in overall quality of life, self-image, physical symptoms, and general health in studies assessing change in quality of life.  PHQ-9: Review Flowsheet  More data exists      04/23/2024 01/16/2024 11/06/2023 10/12/2023 09/18/2023  Depression screen PHQ 2/9  Decreased Interest 2 0 0 0 0  Down, Depressed, Hopeless 1 0 0 1 1  PHQ - 2 Score 3 0 0 1 1  Altered sleeping 1 0 - 1 0  Tired, decreased energy 2 0 - 3 3  Change in appetite 0 0 - 0 2  Feeling bad or failure about yourself  0  0 - 1 0  Trouble concentrating 0 0 - 1 1  Moving slowly or fidgety/restless 0 0 - 1 1  Suicidal thoughts 0 0 - 0 0  PHQ-9 Score 6 0 - 8 8  Difficult doing work/chores Not difficult at all Not difficult at all - Somewhat difficult Somewhat difficult   Interpretation of Total Score  Total Score Depression Severity:  1-4 = Minimal depression, 5-9 = Mild depression, 10-14 = Moderate depression, 15-19 = Moderately severe depression, 20-27 = Severe depression   Psychosocial Evaluation and Intervention:   Psychosocial Re-Evaluation:  Psychosocial Re-Evaluation     Row Name 04/30/24 1153             Psychosocial Re-Evaluation   Current issues with Current Stress Concerns       Comments Mayerly did not voice any increased concerns or stressors on her first day of exercise.       Expected Outcomes Amirra will have controlled or decreased stressors upon completion of cardiac rehab       Interventions Stress management education;Relaxation education;Encouraged to attend Cardiac Rehabilitation for the exercise       Continue Psychosocial Services  Follow up required by staff         Initial Review   Source of Stress Concerns Chronic Illness       Comments will continue to monitor and offer support as needed          Psychosocial Discharge (Final Psychosocial Re-Evaluation):  Psychosocial Re-Evaluation - 04/30/24 1153       Psychosocial Re-Evaluation   Current issues with Current Stress Concerns    Comments Cinthya did not voice any increased concerns or stressors on her first day of exercise.    Expected Outcomes Heike will have controlled or decreased stressors upon completion of cardiac rehab    Interventions Stress management education;Relaxation education;Encouraged to attend  Cardiac Rehabilitation for the exercise    Continue Psychosocial Services  Follow up required by staff      Initial Review   Source of Stress Concerns Chronic Illness    Comments will continue to  monitor and offer support as needed          Vocational Rehabilitation: Provide vocational rehab assistance to qualifying candidates.   Vocational Rehab Evaluation & Intervention:  Vocational Rehab - 04/23/24 0856       Initial Vocational Rehab Evaluation & Intervention   Assessment shows need for Vocational Rehabilitation No      Vocational Rehab Re-Evaulation   Comments Retired, no VR needs.          Education: Education Goals: Education classes will be provided on a weekly basis, covering required topics. Participant will state understanding/return demonstration of topics presented.     Core Videos: Exercise    Move It!  Clinical staff conducted group or individual video education with verbal and written material and guidebook.  Patient learns the recommended Pritikin exercise program. Exercise with the goal of living a long, healthy life. Some of the health benefits of exercise include controlled diabetes, healthier blood pressure levels, improved cholesterol levels, improved heart and lung capacity, improved sleep, and better body composition. Everyone should speak with their doctor before starting or changing an exercise routine.  Biomechanical Limitations Clinical staff conducted group or individual video education with verbal and written material and guidebook.  Patient learns how biomechanical limitations can impact exercise and how we can mitigate and possibly overcome limitations to have an impactful and balanced exercise routine.  Body Composition Clinical staff conducted group or individual video education with verbal and written material and guidebook.  Patient learns that body composition (ratio of muscle mass to fat mass) is a key component to assessing overall fitness, rather than body weight alone. Increased fat mass, especially visceral belly fat, can put us  at increased risk for metabolic syndrome, type 2 diabetes, heart disease, and even death. It is  recommended to combine diet and exercise (cardiovascular and resistance training) to improve your body composition. Seek guidance from your physician and exercise physiologist before implementing an exercise routine.  Exercise Action Plan Clinical staff conducted group or individual video education with verbal and written material and guidebook.  Patient learns the recommended strategies to achieve and enjoy long-term exercise adherence, including variety, self-motivation, self-efficacy, and positive decision making. Benefits of exercise include fitness, good health, weight management, more energy, better sleep, less stress, and overall well-being.  Medical   Heart Disease Risk Reduction Clinical staff conducted group or individual video education with verbal and written material and guidebook.  Patient learns our heart is our most vital organ as it circulates oxygen, nutrients, white blood cells, and hormones throughout the entire body, and carries waste away. Data supports a plant-based eating plan like the Pritikin Program for its effectiveness in slowing progression of and reversing heart disease. The video provides a number of recommendations to address heart disease.   Metabolic Syndrome and Belly Fat  Clinical staff conducted group or individual video education with verbal and written material and guidebook.  Patient learns what metabolic syndrome is, how it leads to heart disease, and how one can reverse it and keep it from coming back. You have metabolic syndrome if you have 3 of the following 5 criteria: abdominal obesity, high blood pressure, high triglycerides, low HDL cholesterol, and high blood sugar.  Hypertension and Heart Disease Clinical staff conducted group  or individual video education with verbal and written material and guidebook.  Patient learns that high blood pressure, or hypertension, is very common in the [place] . Hypertension is largely due to excessive salt  intake, but other important risk factors include being overweight, physical inactivity, drinking too much alcohol, smoking, and not eating enough potassium from fruits and vegetables. High blood pressure is a leading risk factor for heart attack, stroke, congestive heart failure, dementia, kidney failure, and premature death. Long-term effects of excessive salt intake include stiffening of the arteries and thickening of heart muscle and organ damage. Recommendations include ways to reduce hypertension and the risk of heart disease.  Diseases of Our Time - Focusing on Diabetes Clinical staff conducted group or individual video education with verbal and written material and guidebook.  Patient learns why the best way to stop diseases of our time is prevention, through food and other lifestyle changes. Medicine (such as prescription pills and surgeries) is often only a Band-Aid on the problem, not a long-term solution. Most common diseases of our time include obesity, type 2 diabetes, hypertension, heart disease, and cancer. The Pritikin Program is recommended and has been proven to help reduce, reverse, and/or prevent the damaging effects of metabolic syndrome.  Nutrition   Overview of the Pritikin Eating Plan  Clinical staff conducted group or individual video education with verbal and written material and guidebook.  Patient learns about the Pritikin Eating Plan for disease risk reduction. The Pritikin Eating Plan emphasizes a wide variety of unrefined, minimally-processed carbohydrates, like fruits, vegetables, whole grains, and legumes. Go, Caution, and Stop food choices are explained. Plant-based and lean animal proteins are emphasized. Rationale provided for low sodium intake for blood pressure control, low added sugars for blood sugar stabilization, and low added fats and oils for coronary artery disease risk reduction and weight management.  Calorie Density  Clinical staff conducted group or  individual video education with verbal and written material and guidebook.  Patient learns about calorie density and how it impacts the Pritikin Eating Plan. Knowing the characteristics of the food you choose will help you decide whether those foods will lead to weight gain or weight loss, and whether you want to consume more or less of them. Weight loss is usually a side effect of the Pritikin Eating Plan because of its focus on low calorie-dense foods.  Label Reading  Clinical staff conducted group or individual video education with verbal and written material and guidebook.  Patient learns about the Pritikin recommended label reading guidelines and corresponding recommendations regarding calorie density, added sugars, sodium content, and whole grains.  Dining Out - Part 1  Clinical staff conducted group or individual video education with verbal and written material and guidebook.  Patient learns that restaurant meals can be sabotaging because they can be so high in calories, fat, sodium, and/or sugar. Patient learns recommended strategies on how to positively address this and avoid unhealthy pitfalls.  Facts on Fats  Clinical staff conducted group or individual video education with verbal and written material and guidebook.  Patient learns that lifestyle modifications can be just as effective, if not more so, as many medications for lowering your risk of heart disease. A Pritikin lifestyle can help to reduce your risk of inflammation and atherosclerosis (cholesterol build-up, or plaque, in the artery walls). Lifestyle interventions such as dietary choices and physical activity address the cause of atherosclerosis. A review of the types of fats and their impact on blood cholesterol levels, along with  dietary recommendations to reduce fat intake is also included.  Nutrition Action Plan  Clinical staff conducted group or individual video education with verbal and written material and guidebook.   Patient learns how to incorporate Pritikin recommendations into their lifestyle. Recommendations include planning and keeping personal health goals in mind as an important part of their success.  Healthy Mind-Set    Healthy Minds, Bodies, Hearts  Clinical staff conducted group or individual video education with verbal and written material and guidebook.  Patient learns how to identify when they are stressed. Video will discuss the impact of that stress, as well as the many benefits of stress management. Patient will also be introduced to stress management techniques. The way we think, act, and feel has an impact on our hearts.  How Our Thoughts Can Heal Our Hearts  Clinical staff conducted group or individual video education with verbal and written material and guidebook.  Patient learns that negative thoughts can cause depression and anxiety. This can result in negative lifestyle behavior and serious health problems. Cognitive behavioral therapy is an effective method to help control our thoughts in order to change and improve our emotional outlook.  Additional Videos:  Exercise    Improving Performance  Clinical staff conducted group or individual video education with verbal and written material and guidebook.  Patient learns to use a non-linear approach by alternating intensity levels and lengths of time spent exercising to help burn more calories and lose more body fat. Cardiovascular exercise helps improve heart health, metabolism, hormonal balance, blood sugar control, and recovery from fatigue. Resistance training improves strength, endurance, balance, coordination, reaction time, metabolism, and muscle mass. Flexibility exercise improves circulation, posture, and balance. Seek guidance from your physician and exercise physiologist before implementing an exercise routine and learn your capabilities and proper form for all exercise.  Introduction to Yoga  Clinical staff conducted group or  individual video education with verbal and written material and guidebook.  Patient learns about yoga, a discipline of the coming together of mind, breath, and body. The benefits of yoga include improved flexibility, improved range of motion, better posture and core strength, increased lung function, weight loss, and positive self-image. Yoga's heart health benefits include lowered blood pressure, healthier heart rate, decreased cholesterol and triglyceride levels, improved immune function, and reduced stress. Seek guidance from your physician and exercise physiologist before implementing an exercise routine and learn your capabilities and proper form for all exercise.  Medical   Aging: Enhancing Your Quality of Life  Clinical staff conducted group or individual video education with verbal and written material and guidebook.  Patient learns key strategies and recommendations to stay in good physical health and enhance quality of life, such as prevention strategies, having an advocate, securing a Health Care Proxy and Power of Attorney, and keeping a list of medications and system for tracking them. It also discusses how to avoid risk for bone loss.  Biology of Weight Control  Clinical staff conducted group or individual video education with verbal and written material and guidebook.  Patient learns that weight gain occurs because we consume more calories than we burn (eating more, moving less). Even if your body weight is normal, you may have higher ratios of fat compared to muscle mass. Too much body fat puts you at increased risk for cardiovascular disease, heart attack, stroke, type 2 diabetes, and obesity-related cancers. In addition to exercise, following the Pritikin Eating Plan can help reduce your risk.  Decoding Lab Results  Clinical staff  conducted group or individual video education with verbal and written material and guidebook.  Patient learns that lab test reflects one measurement whose  values change over time and are influenced by many factors, including medication, stress, sleep, exercise, food, hydration, pre-existing medical conditions, and more. It is recommended to use the knowledge from this video to become more involved with your lab results and evaluate your numbers to speak with your doctor.   Diseases of Our Time - Overview  Clinical staff conducted group or individual video education with verbal and written material and guidebook.  Patient learns that according to the CDC, 50% to 70% of chronic diseases (such as obesity, type 2 diabetes, elevated lipids, hypertension, and heart disease) are avoidable through lifestyle improvements including healthier food choices, listening to satiety cues, and increased physical activity.  Sleep Disorders Clinical staff conducted group or individual video education with verbal and written material and guidebook.  Patient learns how good quality and duration of sleep are important to overall health and well-being. Patient also learns about sleep disorders and how they impact health along with recommendations to address them, including discussing with a physician.  Nutrition  Dining Out - Part 2 Clinical staff conducted group or individual video education with verbal and written material and guidebook.  Patient learns how to plan ahead and communicate in order to maximize their dining experience in a healthy and nutritious manner. Included are recommended food choices based on the type of restaurant the patient is visiting.   Fueling a Banker conducted group or individual video education with verbal and written material and guidebook.  There is a strong connection between our food choices and our health. Diseases like obesity and type 2 diabetes are very prevalent and are in large-part due to lifestyle choices. The Pritikin Eating Plan provides plenty of food and hunger-curbing satisfaction. It is easy to follow,  affordable, and helps reduce health risks.  Menu Workshop  Clinical staff conducted group or individual video education with verbal and written material and guidebook.  Patient learns that restaurant meals can sabotage health goals because they are often packed with calories, fat, sodium, and sugar. Recommendations include strategies to plan ahead and to communicate with the manager, chef, or server to help order a healthier meal.  Planning Your Eating Strategy  Clinical staff conducted group or individual video education with verbal and written material and guidebook.  Patient learns about the Pritikin Eating Plan and its benefit of reducing the risk of disease. The Pritikin Eating Plan does not focus on calories. Instead, it emphasizes high-quality, nutrient-rich foods. By knowing the characteristics of the foods, we choose, we can determine their calorie density and make informed decisions.  Targeting Your Nutrition Priorities  Clinical staff conducted group or individual video education with verbal and written material and guidebook.  Patient learns that lifestyle habits have a tremendous impact on disease risk and progression. This video provides eating and physical activity recommendations based on your personal health goals, such as reducing LDL cholesterol, losing weight, preventing or controlling type 2 diabetes, and reducing high blood pressure.  Vitamins and Minerals  Clinical staff conducted group or individual video education with verbal and written material and guidebook.  Patient learns different ways to obtain key vitamins and minerals, including through a recommended healthy diet. It is important to discuss all supplements you take with your doctor.   Healthy Mind-Set    Smoking Cessation  Clinical staff conducted group or individual video  education with verbal and written material and guidebook.  Patient learns that cigarette smoking and tobacco addiction pose a serious health  risk which affects millions of people. Stopping smoking will significantly reduce the risk of heart disease, lung disease, and many forms of cancer. Recommended strategies for quitting are covered, including working with your doctor to develop a successful plan.  Culinary   Becoming a Set designer conducted group or individual video education with verbal and written material and guidebook.  Patient learns that cooking at home can be healthy, cost-effective, quick, and puts them in control. Keys to cooking healthy recipes will include looking at your recipe, assessing your equipment needs, planning ahead, making it simple, choosing cost-effective seasonal ingredients, and limiting the use of added fats, salts, and sugars.  Cooking - Breakfast and Snacks  Clinical staff conducted group or individual video education with verbal and written material and guidebook.  Patient learns how important breakfast is to satiety and nutrition through the entire day. Recommendations include key foods to eat during breakfast to help stabilize blood sugar levels and to prevent overeating at meals later in the day. Planning ahead is also a key component.  Cooking - Educational psychologist conducted group or individual video education with verbal and written material and guidebook.  Patient learns eating strategies to improve overall health, including an approach to cook more at home. Recommendations include thinking of animal protein as a side on your plate rather than center stage and focusing instead on lower calorie dense options like vegetables, fruits, whole grains, and plant-based proteins, such as beans. Making sauces in large quantities to freeze for later and leaving the skin on your vegetables are also recommended to maximize your experience.  Cooking - Healthy Salads and Dressing Clinical staff conducted group or individual video education with verbal and written material and  guidebook.  Patient learns that vegetables, fruits, whole grains, and legumes are the foundations of the Pritikin Eating Plan. Recommendations include how to incorporate each of these in flavorful and healthy salads, and how to create homemade salad dressings. Proper handling of ingredients is also covered. Cooking - Soups and [employer] - Soups and Desserts Clinical staff conducted group or individual video education with verbal and written material and guidebook.  Patient learns that Pritikin soups and desserts make for easy, nutritious, and delicious snacks and meal components that are low in sodium, fat, sugar, and calorie density, while high in vitamins, minerals, and filling fiber. Recommendations include simple and healthy ideas for soups and desserts.   Overview     The Pritikin Solution Program Overview Clinical staff conducted group or individual video education with verbal and written material and guidebook.  Patient learns that the results of the Pritikin Program have been documented in more than 100 articles published in peer-reviewed journals, and the benefits include reducing risk factors for (and, in some cases, even reversing) high cholesterol, high blood pressure, type 2 diabetes, obesity, and more! An overview of the three key pillars of the Pritikin Program will be covered: eating well, doing regular exercise, and having a healthy mind-set.  WORKSHOPS  Exercise: Exercise Basics: Building Your Action Plan Clinical staff led group instruction and group discussion with PowerPoint presentation and patient guidebook. To enhance the learning environment the use of posters, models and videos may be added. At the conclusion of this workshop, patients will comprehend the difference between physical activity and exercise, as well as the benefits of  incorporating both, into their routine. Patients will understand the FITT (Frequency, Intensity, Time, and Type) principle and how to  use it to build an exercise action plan. In addition, safety concerns and other considerations for exercise and cardiac rehab will be addressed by the presenter. The purpose of this lesson is to promote a comprehensive and effective weekly exercise routine in order to improve patients' overall level of fitness.   Managing Heart Disease: Your Path to a Healthier Heart Clinical staff led group instruction and group discussion with PowerPoint presentation and patient guidebook. To enhance the learning environment the use of posters, models and videos may be added.At the conclusion of this workshop, patients will understand the anatomy and physiology of the heart. Additionally, they will understand how Pritikin's three pillars impact the risk factors, the progression, and the management of heart disease.  The purpose of this lesson is to provide a high-level overview of the heart, heart disease, and how the Pritikin lifestyle positively impacts risk factors.  Exercise Biomechanics Clinical staff led group instruction and group discussion with PowerPoint presentation and patient guidebook. To enhance the learning environment the use of posters, models and videos may be added. Patients will learn how the structural parts of their bodies function and how these functions impact their daily activities, movement, and exercise. Patients will learn how to promote a neutral spine, learn how to manage pain, and identify ways to improve their physical movement in order to promote healthy living. The purpose of this lesson is to expose patients to common physical limitations that impact physical activity. Participants will learn practical ways to adapt and manage aches and pains, and to minimize their effect on regular exercise. Patients will learn how to maintain good posture while sitting, walking, and lifting.  Balance Training and Fall Prevention  Clinical staff led group instruction and group discussion  with PowerPoint presentation and patient guidebook. To enhance the learning environment the use of posters, models and videos may be added. At the conclusion of this workshop, patients will understand the importance of their sensorimotor skills (vision, proprioception, and the vestibular system) in maintaining their ability to balance as they age. Patients will apply a variety of balancing exercises that are appropriate for their current level of function. Patients will understand the common causes for poor balance, possible solutions to these problems, and ways to modify their physical environment in order to minimize their fall risk. The purpose of this lesson is to teach patients about the importance of maintaining balance as they age and ways to minimize their risk of falling.  WORKSHOPS   Nutrition:  Fueling a Ship broker led group instruction and group discussion with PowerPoint presentation and patient guidebook. To enhance the learning environment the use of posters, models and videos may be added. Patients will review the foundational principles of the Pritikin Eating Plan and understand what constitutes a serving size in each of the food groups. Patients will also learn Pritikin-friendly foods that are better choices when away from home and review make-ahead meal and snack options. Calorie density will be reviewed and applied to three nutrition priorities: weight maintenance, weight loss, and weight gain. The purpose of this lesson is to reinforce (in a group setting) the key concepts around what patients are recommended to eat and how to apply these guidelines when away from home by planning and selecting Pritikin-friendly options. Patients will understand how calorie density may be adjusted for different weight management goals.  Mindful Eating  Clinical  staff led group instruction and group discussion with PowerPoint presentation and patient guidebook. To enhance the  learning environment the use of posters, models and videos may be added. Patients will briefly review the concepts of the Pritikin Eating Plan and the importance of low-calorie dense foods. The concept of mindful eating will be introduced as well as the importance of paying attention to internal hunger signals. Triggers for non-hunger eating and techniques for dealing with triggers will be explored. The purpose of this lesson is to provide patients with the opportunity to review the basic principles of the Pritikin Eating Plan, discuss the value of eating mindfully and how to measure internal cues of hunger and fullness using the Hunger Scale. Patients will also discuss reasons for non-hunger eating and learn strategies to use for controlling emotional eating.  Targeting Your Nutrition Priorities Clinical staff led group instruction and group discussion with PowerPoint presentation and patient guidebook. To enhance the learning environment the use of posters, models and videos may be added. Patients will learn how to determine their genetic susceptibility to disease by reviewing their family history. Patients will gain insight into the importance of diet as part of an overall healthy lifestyle in mitigating the impact of genetics and other environmental insults. The purpose of this lesson is to provide patients with the opportunity to assess their personal nutrition priorities by looking at their family history, their own health history and current risk factors. Patients will also be able to discuss ways of prioritizing and modifying the Pritikin Eating Plan for their highest risk areas  Menu  Clinical staff led group instruction and group discussion with PowerPoint presentation and patient guidebook. To enhance the learning environment the use of posters, models and videos may be added. Using menus brought in from [employer], or printed from [employer], patients will apply the Pritikin dining out  guidelines that were presented in the [employer] video. Patients will also be able to practice these guidelines in a variety of provided scenarios. The purpose of this lesson is to provide patients with the opportunity to practice hands-on learning of the Pritikin Dining Out guidelines with actual menus and practice scenarios.  Label Reading Clinical staff led group instruction and group discussion with PowerPoint presentation and patient guidebook. To enhance the learning environment the use of posters, models and videos may be added. Patients will review and discuss the Pritikin label reading guidelines presented in Pritikin's Label Reading Educational series video. Using fool labels brought in from local grocery stores and markets, patients will apply the label reading guidelines and determine if the packaged food meet the Pritikin guidelines. The purpose of this lesson is to provide patients with the opportunity to review, discuss, and practice hands-on learning of the Pritikin Label Reading guidelines with actual packaged food labels. Cooking School  Pritikin's [employer] are designed to teach patients ways to prepare quick, simple, and affordable recipes at home. The importance of nutrition's role in chronic disease risk reduction is reflected in its emphasis in the overall Pritikin program. By learning how to prepare essential core Pritikin Eating Plan recipes, patients will increase control over what they eat; be able to customize the flavor of foods without the use of added salt, sugar, or fat; and improve the quality of the food they consume. By learning a set of core recipes which are easily assembled, quickly prepared, and affordable, patients are more likely to prepare more healthy foods at home. These workshops focus on convenient  breakfasts, simple entres, side dishes, and desserts which can be prepared with minimal effort and are consistent with nutrition  recommendations for cardiovascular risk reduction. Cooking [employer] are taught by a Armed forces logistics/support/administrative officer (RD) who has been trained by the [employer]. The chef or RD has a clear understanding of the importance of minimizing - if not completely eliminating - added fat, sugar, and sodium in recipes. Throughout the series of Cooking School Workshop sessions, patients will learn about healthy ingredients and efficient methods of cooking to build confidence in their capability to prepare    Cooking School weekly topics:  Adding Flavor- Sodium-Free  Fast and Healthy Breakfasts  Powerhouse Plant-Based Proteins  Satisfying Salads and Dressings  Simple Sides and Sauces  International Cuisine-Spotlight on the [employer] Zones  Delicious Desserts  Savory Soups  [employer] - Meals in a Astronomer Appetizers and Snacks  Comforting Weekend Breakfasts  One-Pot Wonders   Fast Evening Meals  Landscape architect Your Pritikin Plate  WORKSHOPS   Healthy Mindset (Psychosocial):  Focused Goals, Sustainable Changes Clinical staff led group instruction and group discussion with PowerPoint presentation and patient guidebook. To enhance the learning environment the use of posters, models and videos may be added. Patients will be able to apply effective goal setting strategies to establish at least one personal goal, and then take consistent, meaningful action toward that goal. They will learn to identify common barriers to achieving personal goals and develop strategies to overcome them. Patients will also gain an understanding of how our mind-set can impact our ability to achieve goals and the importance of cultivating a positive and growth-oriented mind-set. The purpose of this lesson is to provide patients with a deeper understanding of how to set and achieve personal goals, as well as the tools and strategies needed to overcome common obstacles which may arise along  the way.  From Head to Heart: The Power of a Healthy Outlook  Clinical staff led group instruction and group discussion with PowerPoint presentation and patient guidebook. To enhance the learning environment the use of posters, models and videos may be added. Patients will be able to recognize and describe the impact of emotions and mood on physical health. They will discover the importance of self-care and explore self-care practices which may work for them. Patients will also learn how to utilize the 4 C's to cultivate a healthier outlook and better manage stress and challenges. The purpose of this lesson is to demonstrate to patients how a healthy outlook is an essential part of maintaining good health, especially as they continue their cardiac rehab journey.  Healthy Sleep for a Healthy Heart Clinical staff led group instruction and group discussion with PowerPoint presentation and patient guidebook. To enhance the learning environment the use of posters, models and videos may be added. At the conclusion of this workshop, patients will be able to demonstrate knowledge of the importance of sleep to overall health, well-being, and quality of life. They will understand the symptoms of, and treatments for, common sleep disorders. Patients will also be able to identify daytime and nighttime behaviors which impact sleep, and they will be able to apply these tools to help manage sleep-related challenges. The purpose of this lesson is to provide patients with a general overview of sleep and outline the importance of quality sleep. Patients will learn about a few of the most common sleep disorders. Patients will also be introduced to the concept of "sleep hygiene," and  discover ways to self-manage certain sleeping problems through simple daily behavior changes. Finally, the workshop will motivate patients by clarifying the links between quality sleep and their goals of heart-healthy living.   Recognizing and  Reducing Stress Clinical staff led group instruction and group discussion with PowerPoint presentation and patient guidebook. To enhance the learning environment the use of posters, models and videos may be added. At the conclusion of this workshop, patients will be able to understand the types of stress reactions, differentiate between acute and chronic stress, and recognize the impact that chronic stress has on their health. They will also be able to apply different coping mechanisms, such as reframing negative self-talk. Patients will have the opportunity to practice a variety of stress management techniques, such as deep abdominal breathing, progressive muscle relaxation, and/or guided imagery.  The purpose of this lesson is to educate patients on the role of stress in their lives and to provide healthy techniques for coping with it.  Learning Barriers/Preferences:  Learning Barriers/Preferences - 04/23/24 0856       Learning Barriers/Preferences   Learning Barriers Language;Sight    Learning Preferences Individual Instruction          Education Topics:  Knowledge Questionnaire Score:  Knowledge Questionnaire Score - 04/23/24 1546       Knowledge Questionnaire Score   Pre Score 6/24 completed with interpreter          Core Components/Risk Factors/Patient Goals at Admission:  Personal Goals and Risk Factors at Admission - 04/23/24 0857       Core Components/Risk Factors/Patient Goals on Admission   Diabetes Yes    Intervention Provide education about signs/symptoms and action to take for hypo/hyperglycemia.;Provide education about proper nutrition, including hydration, and aerobic/resistive exercise prescription along with prescribed medications to achieve blood glucose in normal ranges: Fasting glucose 65-99 mg/dL    Expected Outcomes Short Term: Participant verbalizes understanding of the signs/symptoms and immediate care of hyper/hypoglycemia, proper foot care and importance  of medication, aerobic/resistive exercise and nutrition plan for blood glucose control.;Long Term: Attainment of HbA1C < 7%.    Hypertension Yes    Intervention Provide education on lifestyle modifcations including regular physical activity/exercise, weight management, moderate sodium restriction and increased consumption of fresh fruit, vegetables, and low fat dairy, alcohol moderation, and smoking cessation.;Monitor prescription use compliance.    Expected Outcomes Short Term: Continued assessment and intervention until BP is < 140/22mm HG in hypertensive participants. < 130/69mm HG in hypertensive participants with diabetes, heart failure or chronic kidney disease.;Long Term: Maintenance of blood pressure at goal levels.    Lipids Yes    Intervention Provide education and support for participant on nutrition & aerobic/resistive exercise along with prescribed medications to achieve LDL 70mg , HDL >40mg .    Expected Outcomes Short Term: Participant states understanding of desired cholesterol values and is compliant with medications prescribed. Participant is following exercise prescription and nutrition guidelines.;Long Term: Cholesterol controlled with medications as prescribed, with individualized exercise RX and with personalized nutrition plan. Value goals: LDL < 70mg , HDL > 40 mg.          Core Components/Risk Factors/Patient Goals Review:   Goals and Risk Factor Review     Row Name 04/30/24 1156             Core Components/Risk Factors/Patient Goals Review   Personal Goals Review Weight Management/Obesity;Lipids;Hypertension;Diabetes;Stress       Review Talullah completed exercise on 04/29/24. Vital signs were stable. CBG 78 post c/o feeling lightheaded juice  given symptoms resolved.       Expected Outcomes Tonetta will continue to participate in cardiac rehab for exercise, nutrition and lifestyle modifications          Core Components/Risk Factors/Patient Goals at Discharge (Final  Review):   Goals and Risk Factor Review - 04/30/24 1156       Core Components/Risk Factors/Patient Goals Review   Personal Goals Review Weight Management/Obesity;Lipids;Hypertension;Diabetes;Stress    Review Charnise completed exercise on 04/29/24. Vital signs were stable. CBG 78 post c/o feeling lightheaded juice given symptoms resolved.    Expected Outcomes Poetry will continue to participate in cardiac rehab for exercise, nutrition and lifestyle modifications          ITP Comments:  ITP Comments     Row Name 04/23/24 0801 04/30/24 1149         ITP Comments Medical Director- Dr. Wilbert Bihari, MD. Introduction to the [facility] Education / Intensive Cardiac Rehab Program. Review of initial orientation folder. 30 Day ITP Review. Alleah started cardiac rehab on 04/29/24. Tinika did fair with exercise. Toy did report feeling lightheaded after getting of the nustep. Given juiice for a CBG of 78 post exercise. Sameena was able to complete her session without further complaints.         Comments: See ITP comments.Hadassah Elpidio Quan RN BSN

## 2024-05-08 ENCOUNTER — Encounter (HOSPITAL_COMMUNITY)
Admission: RE | Admit: 2024-05-08 | Discharge: 2024-05-08 | Disposition: A | Discharge status: Home / Self Care | Source: Ambulatory Visit | Attending: Cardiology | Admitting: Cardiology

## 2024-05-08 DIAGNOSIS — Z952 Presence of prosthetic heart valve: Secondary | ICD-10-CM | POA: Diagnosis not present

## 2024-05-09 ENCOUNTER — Telehealth (HOSPITAL_BASED_OUTPATIENT_CLINIC_OR_DEPARTMENT_OTHER): Payer: Self-pay | Admitting: Family Medicine

## 2024-05-09 NOTE — Telephone Encounter (Signed)
 Recieved Via fax Made Copies Attached billing form  placed in Providers Box Advised pt to allow 7-10 business days

## 2024-05-10 ENCOUNTER — Encounter (HOSPITAL_COMMUNITY)
Admission: RE | Admit: 2024-05-10 | Discharge: 2024-05-10 | Disposition: A | Discharge status: Home / Self Care | Source: Ambulatory Visit | Attending: Cardiology | Admitting: Cardiology

## 2024-05-10 DIAGNOSIS — Z952 Presence of prosthetic heart valve: Secondary | ICD-10-CM | POA: Diagnosis not present

## 2024-05-13 ENCOUNTER — Encounter (HOSPITAL_COMMUNITY)
Admission: RE | Admit: 2024-05-13 | Discharge: 2024-05-13 | Disposition: A | Discharge status: Home / Self Care | Source: Ambulatory Visit | Attending: Cardiology

## 2024-05-13 ENCOUNTER — Telehealth (HOSPITAL_BASED_OUTPATIENT_CLINIC_OR_DEPARTMENT_OTHER): Payer: Self-pay | Admitting: Family Medicine

## 2024-05-13 DIAGNOSIS — Z952 Presence of prosthetic heart valve: Secondary | ICD-10-CM | POA: Diagnosis not present

## 2024-05-13 NOTE — Telephone Encounter (Signed)
 Recieved Via fax Made Copies Attached billing form  placed in Providers Box Advised pt to allow 7-10 business days

## 2024-05-15 ENCOUNTER — Ambulatory Visit

## 2024-05-15 ENCOUNTER — Encounter (HOSPITAL_COMMUNITY)
Admission: RE | Admit: 2024-05-15 | Discharge: 2024-05-15 | Disposition: A | Discharge status: Home / Self Care | Source: Ambulatory Visit | Attending: Cardiology | Admitting: Cardiology

## 2024-05-15 DIAGNOSIS — Z952 Presence of prosthetic heart valve: Secondary | ICD-10-CM | POA: Diagnosis not present

## 2024-05-15 NOTE — Progress Notes (Signed)
 CARDIAC REHAB PHASE 2  With the help of a translator, Reviewed home exercise with pt today. Pt is tolerating exercise well. Pt will continue to exercise on her own by walking for 30 minutes (broken up into three 10 min bouts due to fatigue and SOB) 1-2 days a week in addition to the 3 days in CRP2. Advised pt on THRR, RPE scale, hydration and temperature/humidity precautions. Reinforced S/S to stop exercise and when to call [place] vs 911. Encouraged warm up cool down and stretches with exercise sessions. Pt verbalized understanding, all questions were answered and pt was given a copy to take home.    Alec GORMAN Finder ACSM-CEP 05/15/2024 8:42 AM

## 2024-05-17 ENCOUNTER — Encounter (HOSPITAL_COMMUNITY)
Admission: RE | Admit: 2024-05-17 | Discharge: 2024-05-17 | Disposition: A | Discharge status: Home / Self Care | Source: Ambulatory Visit | Attending: Cardiology | Admitting: Cardiology

## 2024-05-17 DIAGNOSIS — Z952 Presence of prosthetic heart valve: Secondary | ICD-10-CM

## 2024-05-17 LAB — GLUCOSE, CAPILLARY: Glucose-Capillary: 119 mg/dL — ABNORMAL HIGH (ref 70–99)

## 2024-05-17 NOTE — Progress Notes (Signed)
 Daily Session Note  Patient Details  Name: Christy Poole MRN: [id number] Date of Birth: May 10, 1961 Referring Provider:   Flowsheet Row INTENSIVE CARDIAC REHAB ORIENT from 04/25/2024 in [facility]  Referring Provider Shelda Bruckner, MD    Encounter Date: 05/17/2024  Check In:  Session Check In - 05/17/24 9287       Check-In   Supervising physician immediately available to respond to emergencies [facility] MD immediately available    Physician(s) Orren Fabry, PA-C    Location [facility]    Staff Present Theadora Byes, RN, MHA;Other;Jetta Vannie HECKLE, ACSM-CEP, Exercise Physiologist;Olinty Valere, MS, ACSM-CEP, Exercise Physiologist;Bailey Elnor, MS, Exercise Physiologist    Virtual Visit No    Medication changes reported     No    Fall or balance concerns reported    No    Tobacco Cessation No Change    Warm-up and Cool-down Performed as group-led instruction    Resistance Training Performed Yes    VAD Patient? No    PAD/SET Patient? No      Pain Assessment   Currently in Pain? No/denies    Pain Score 0-No pain    Multiple Pain Sites No          Capillary Blood Glucose: Results for orders placed or performed during the hospital encounter of 05/17/24 (from the past 24 hours)  Glucose, capillary     Status: Abnormal   Collection Time: 05/17/24  7:03 AM  Result Value Ref Range   Glucose-Capillary 119 (H) 70 - 99 mg/dL      Social History   Tobacco Use  Smoking Status Never   Passive exposure: Never  Smokeless Tobacco Never    Goals Met:  Exercise tolerated well Strength training completed today  Goals Unmet:  Not Applicable  Comments: With the help of the [employer] translator and presence of son(during the assessment), [name] communicated that she was feeling somewhat short of breath and had a headache.  VSS, BP 126/68, CBG 119 she ate breakfast, warm dry to touch,  SpO2 100%, temp 97.8, communicated no vision changes. After vital sign check and values WNL Taneka communicated via translator that she was good to proceed with exercise. She tolerated the exercise session without further complaints and communicated at the end of the session via the translator she did feel better just tired.   Dr. Wilbert Bihari is Medical Director for Cardiac Rehab at [facility].

## 2024-05-20 ENCOUNTER — Encounter (HOSPITAL_COMMUNITY)
Admission: RE | Admit: 2024-05-20 | Discharge: 2024-05-20 | Disposition: A | Discharge status: Home / Self Care | Source: Ambulatory Visit | Attending: Cardiology | Admitting: Cardiology

## 2024-05-20 ENCOUNTER — Ambulatory Visit (HOSPITAL_BASED_OUTPATIENT_CLINIC_OR_DEPARTMENT_OTHER): Admitting: Family Medicine

## 2024-05-20 DIAGNOSIS — Z952 Presence of prosthetic heart valve: Secondary | ICD-10-CM

## 2024-05-22 ENCOUNTER — Encounter (HOSPITAL_BASED_OUTPATIENT_CLINIC_OR_DEPARTMENT_OTHER): Payer: Self-pay

## 2024-05-22 ENCOUNTER — Encounter (HOSPITAL_COMMUNITY)
Admission: RE | Admit: 2024-05-22 | Discharge: 2024-05-22 | Disposition: A | Discharge status: Home / Self Care | Source: Ambulatory Visit | Attending: Cardiology

## 2024-05-22 DIAGNOSIS — Z952 Presence of prosthetic heart valve: Secondary | ICD-10-CM | POA: Diagnosis not present

## 2024-05-22 NOTE — Progress Notes (Unsigned)
 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  Reported prior cath without significant CAD.  Followed in [place]  and then heart from [facility] in [place].  History of TIA/CVA as well, initially 2019 and 11/2021.  Echo 11/2021 with moderate MS, Wilken score 7-8.  Permanent atrial fibrillation, on Coumadin .  She underwent mitral valve replacement 11/29/2023 with Dr. Maryjane, now has 27 mm Saint Jude mechanical mitral valve.  Also s/p LAA closure.  Last cardiology clinic visit 12/27/2023 with Dr. Bruckner.  She was on aspirin  and Coumadin  with INR goal 2.5-3.5.  She was off metoprolol  with heart rate in the 80s bpm.  History of seizures followed by neurology.  Echo 01/11/2024 revealed normal LVEF 55 to 60%, mildly enlarged RV, mild biatrial enlargement, normal structure and function of mitral valve prosthesis, no concerns.  CV risk factors: Diet- - primarily vegetarian, [place] food. No regular exercise regimen, Father had MI, brother had bypass surgery.  Seen by me on 04/23/24 with her son [name], who is interpreting for her. She went to cardiac rehab today for her initial intake and was noted to have irregular heart rhythm.  EKG reveals coarse A-fib with well-controlled ventricular rate. She is asymptomatic. She experiences fatigue and minor chest pain, particularly during physical exertion.  Her family has limited her activities and she generally only performs light tasks such as cleaning the kitchen and playing with her granddaughter. She has shortness of breath at times. Has difficulty falling asleep despite  fatigue, but no chest pain or discomfort occurs during sleep. No orthopnea, PND, palpitations, edema, presyncope or syncope. No bleeding concerns. She has maintained consistent follow-up with our Coumadin  clinic.   History of Present Illness Discussed the use of AI scribe software for clinical note transcription with the patient, who gave verbal consent to proceed. History of Present Illness Christy Poole is a very pleasant 63 year old female who is here today for follow-up of MVR. She is accompanied by her son and we are assisted by [employer]. She experiences intermittent lightheadedness and dizziness during exercise, which causes her to slow down. Mild chest pain occurs during workouts without changes in breathing. She feels more energetic and better overall since starting cardiac rehabilitation. She is currently on warfarin, with concerns about the dosage being high. Recently had some bleeding from her ear after sticking a swab in it to clean it. She experiences minor headaches, fever, and body tiredness occasionally, for which she uses Tylenol . She has been coughing recently, and her family has been sick at home. She had a fever about a week ago, which is now improving. She denies chest pain, shortness of breath, edema, palpitations, presyncope, syncope, orthopnea, and PND.   ROS: See HPI  Studies Reviewed       Lipoprotein (a)  Date/Time Value Ref Range Status  09/08/2023 02:41 AM 12.5 <75.0 nmol/L Final    Comment:    (NOTE) Note:  Values greater than or equal to 75.0 nmol/L may       indicate an independent risk factor for CHD,       but must be evaluated with caution when applied  to non-Caucasian populations due to the       influence of genetic factors on Lp(a) across       ethnicities. Performed At: [facility] [address] [place], [place] [id number] Jennette Shorter MD Ey:[phone]     Risk Assessment/Calculations  CHA2DS2-VASc Score = 6    This indicates a 9.7% annual risk of stroke. The patient's score is based upon: CHF History: 0 HTN History: 1 Diabetes History: 1 Stroke History: 2 Vascular Disease History: 1 Age Score: 0 Gender Score: 1            Physical Exam VS:  BP 120/70 (BP Location: Right Arm, Patient Position: Sitting, Cuff Size: Normal)   Pulse (!) 52   Ht 4' (1.219 m)   Wt 100 lb (45.4 kg)   SpO2 98%   BMI 30.52 kg/m    Wt Readings from Last 3 Encounters:  05/23/24 100 lb (45.4 kg)  04/25/24 100 lb 5 oz (45.5 kg)  04/23/24 102 lb 4.7 oz (46.4 kg)    GEN: Well nourished, well developed in no acute distress NECK: No JVD; No carotid bruits CARDIAC: RRR, no murmurs, rubs, gallops RESPIRATORY:  Clear to auscultation without rales, wheezing or rhonchi  ABDOMEN: Soft, non-tender, non-distended EXTREMITIES:  No edema; No deformity   Assessment & Plan Atrial fibrillation on chronic anticoagulation History of stroke Noted to have permanent atrial fibrillation, however EKG on 11/30/2023 revealed sinus rhythm.  HR is well-controlled. Since she has valvular atrial fibrillation with mechanical valve, she will need to continue Coumadin  for stroke prevention. Tolerating metoprolol  25 mg daily without significant fatigue.  -Consider home HR monitoring device for peace of mind -Continue Coumadin  for stroke prevention -Maintain follow-up with CVRR clinic  Rheumatic mitral stenosis S/p mitral valve replacement S/p mechanical mitral valve 27 mm replacement 11/29/2023. Echo 01/11/24 revealed normal valve function. On Coumadin , goal 2.5-3.5.  INR 3.8 on 04/16/2024. Her son is concerned that Coumadin  dose is too high. Reassurance provided that INR is being closely monitored.  Recent minor ear bleeding. No acute bleeding concerns. She has some shortness of breath with activity but no dyspnea, orthopnea, PND, or chest pain. Is tolerating CR and is feeling better since starting the program. No acute concerns today.  -Continue  follow-up with CVRR clinic  Chest pain   Minor chest pain occurs with activity. Symptoms not concerning for angina.  She had minimal nonobstructive coronary artery with patent mid LAD stents on cardiac cath 09/07/2023. Tolerating cardiac rehab without concerning cardiac symptoms. -Continue cardiac rehab  Dietary counseling for blood sugar management   Dietary habits were discussed, focusing on reducing refined sugars and increasing natural sugars. Emphasis is placed on whole grains, lean proteins, and vegetables. Although she prefers rice and white sugar, she is advised to limit sugar intake to no more than 25 grams per day, focusing on natural sugars, and to avoid refined sugars and processed foods.        Dispo: 4 months with Dr. Lonni  Signed, Rosaline Bane, NP-C

## 2024-05-23 ENCOUNTER — Encounter (HOSPITAL_BASED_OUTPATIENT_CLINIC_OR_DEPARTMENT_OTHER): Payer: Self-pay | Admitting: Nurse Practitioner

## 2024-05-23 ENCOUNTER — Ambulatory Visit (HOSPITAL_BASED_OUTPATIENT_CLINIC_OR_DEPARTMENT_OTHER): Admitting: Nurse Practitioner

## 2024-05-23 VITALS — BP 120/70 | HR 52 | Ht <= 58 in | Wt 100.0 lb

## 2024-05-23 DIAGNOSIS — Z713 Dietary counseling and surveillance: Secondary | ICD-10-CM

## 2024-05-23 DIAGNOSIS — Z8673 Personal history of transient ischemic attack (TIA), and cerebral infarction without residual deficits: Secondary | ICD-10-CM

## 2024-05-23 DIAGNOSIS — I4821 Permanent atrial fibrillation: Secondary | ICD-10-CM

## 2024-05-23 DIAGNOSIS — I050 Rheumatic mitral stenosis: Secondary | ICD-10-CM | POA: Diagnosis not present

## 2024-05-23 DIAGNOSIS — Z952 Presence of prosthetic heart valve: Secondary | ICD-10-CM | POA: Diagnosis not present

## 2024-05-23 DIAGNOSIS — Z7901 Long term (current) use of anticoagulants: Secondary | ICD-10-CM

## 2024-05-23 DIAGNOSIS — R072 Precordial pain: Secondary | ICD-10-CM

## 2024-05-23 MED ORDER — METOPROLOL SUCCINATE ER 25 MG PO TB24: 25.0000 mg | ORAL_TABLET | Freq: Every day | ORAL | 3 refills | Status: AC

## 2024-05-23 NOTE — Patient Instructions (Signed)
 Medication Instructions:   Your physician recommends that you continue on your current medications as directed. Please refer to the Current Medication list given to you today.   *If you need a refill on your cardiac medications before your next appointment, please call your pharmacy*  Lab Work:  None ordered.  If you have labs (blood work) drawn today and your tests are completely normal, you will receive your results only by: MyChart Message (if you have MyChart) OR A paper copy in the mail If you have any lab test that is abnormal or we need to change your treatment, we will call you to review the results.  Testing/Procedures:  None ordered.  Follow-Up: At [facility] you and your health needs are our priority.  As part of our continuing mission to provide you with exceptional heart care, our providers are all part of one team.  This team includes your primary Cardiologist (physician) and Advanced Practice Providers or APPs (Physician Assistants and Nurse Practitioners) who all work together to provide you with the care you need, when you need it.  Your next appointment:   4 month(s)  Provider:   Shelda Bruckner, MD    We recommend signing up for the patient portal called MyChart.  Sign up information is provided on this After Visit Summary.  MyChart is used to connect with patients for Virtual Visits (Telemedicine).  Patients are able to view lab/test results, encounter notes, upcoming appointments, etc.  Non-urgent messages can be sent to your provider as well.   To learn more about what you can do with MyChart, go to [email or web address].   Other Instructions  This is devices to track your blood pressure and heart rate at home. Apple or Kardia watch.   The patient had a Mitral Valve Replacement.

## 2024-05-24 ENCOUNTER — Encounter (HOSPITAL_COMMUNITY)
Admission: RE | Admit: 2024-05-24 | Discharge: 2024-05-24 | Disposition: A | Discharge status: Home / Self Care | Source: Ambulatory Visit | Attending: Cardiology | Admitting: Cardiology

## 2024-05-24 DIAGNOSIS — Z7901 Long term (current) use of anticoagulants: Secondary | ICD-10-CM | POA: Diagnosis not present

## 2024-05-24 DIAGNOSIS — Z952 Presence of prosthetic heart valve: Secondary | ICD-10-CM | POA: Insufficient documentation

## 2024-05-24 DIAGNOSIS — Z8673 Personal history of transient ischemic attack (TIA), and cerebral infarction without residual deficits: Secondary | ICD-10-CM | POA: Insufficient documentation

## 2024-05-24 DIAGNOSIS — I4821 Permanent atrial fibrillation: Secondary | ICD-10-CM | POA: Insufficient documentation

## 2024-05-27 ENCOUNTER — Encounter (HOSPITAL_COMMUNITY)
Admission: RE | Admit: 2024-05-27 | Discharge: 2024-05-27 | Disposition: A | Discharge status: Home / Self Care | Source: Ambulatory Visit | Attending: Cardiology | Admitting: Cardiology

## 2024-05-27 DIAGNOSIS — Z952 Presence of prosthetic heart valve: Secondary | ICD-10-CM | POA: Diagnosis not present

## 2024-05-28 ENCOUNTER — Ambulatory Visit (HOSPITAL_BASED_OUTPATIENT_CLINIC_OR_DEPARTMENT_OTHER): Admitting: Family Medicine

## 2024-05-28 ENCOUNTER — Ambulatory Visit: Attending: Cardiovascular Disease | Admitting: *Deleted

## 2024-05-28 DIAGNOSIS — Z7901 Long term (current) use of anticoagulants: Secondary | ICD-10-CM | POA: Insufficient documentation

## 2024-05-28 DIAGNOSIS — Z8673 Personal history of transient ischemic attack (TIA), and cerebral infarction without residual deficits: Secondary | ICD-10-CM | POA: Diagnosis present

## 2024-05-28 DIAGNOSIS — I4821 Permanent atrial fibrillation: Secondary | ICD-10-CM | POA: Insufficient documentation

## 2024-05-28 LAB — POCT INR: INR: 5.9 — AB (ref 2.0–3.0)

## 2024-05-28 NOTE — Patient Instructions (Signed)
 Description   Do not take any warfarin today and no warfarin tomorrow then START taking warfarin 2 tablets daily, except 3 tablets on Tuesday.   Recheck INR in 1 week. Coumadin  Clinic [phone]

## 2024-05-28 NOTE — Progress Notes (Signed)
 INR-5.9; Educated on risks of bleeding, bruising, falls, accidents and to seek medical care if needed by a Provider with any issues.   Please see anticoagulation encounter

## 2024-05-29 ENCOUNTER — Encounter (HOSPITAL_COMMUNITY)
Admission: RE | Admit: 2024-05-29 | Discharge: 2024-05-29 | Disposition: A | Discharge status: Home / Self Care | Source: Ambulatory Visit | Attending: Cardiology | Admitting: Cardiology

## 2024-05-29 DIAGNOSIS — Z952 Presence of prosthetic heart valve: Secondary | ICD-10-CM

## 2024-05-31 ENCOUNTER — Encounter (HOSPITAL_COMMUNITY)
Admission: RE | Admit: 2024-05-31 | Discharge: 2024-05-31 | Disposition: A | Discharge status: Home / Self Care | Source: Ambulatory Visit | Attending: Cardiology | Admitting: Cardiology

## 2024-05-31 DIAGNOSIS — Z952 Presence of prosthetic heart valve: Secondary | ICD-10-CM | POA: Diagnosis not present

## 2024-05-31 NOTE — Progress Notes (Signed)
 Cardiac Individual Treatment Plan  Patient Details  Name: Christy Poole MRN: [id number] Date of Birth: 03-20-1961 Referring Provider:   Flowsheet Row INTENSIVE CARDIAC REHAB ORIENT from 04/25/2024 in [facility] for Heart, Vascular, & Lung Health  Referring Provider Shelda Bruckner, MD    Initial Encounter Date:  Flowsheet Row INTENSIVE CARDIAC REHAB ORIENT from 04/25/2024 in [facility] for Heart, Vascular, & Lung Health  Date 04/25/24    Visit Diagnosis: 11/28/23 MVR (mitral valve replacement)  Patient's Home Medications on Admission:  Current Outpatient Medications:    acetaminophen  (TYLENOL ) 500 MG tablet, Take 500 mg by mouth every 6 (six) hours as needed for mild pain (pain score 1-3), headache or fever., Disp: , Rfl:    albuterol  (VENTOLIN  HFA) 108 (90 Base) MCG/ACT inhaler, Inhale 1-2 puffs into the lungs every 4 (four) hours as needed for wheezing or shortness of breath., Disp: 1 each, Rfl: 2   aspirin  EC (ASPIRIN  LOW DOSE) 81 MG tablet, TAKE 1 TABLET (81 MG TOTAL) BY MOUTH DAILY. (SWALLOW WHOLE), Disp: 90 tablet, Rfl: 3   atorvastatin  (LIPITOR ) 80 MG tablet, TAKE 1 TABLET (80 MG TOTAL) BY MOUTH DAILY., Disp: 90 tablet, Rfl: 3   cetaphil (CETAPHIL) lotion, Apply 1 Application topically daily as needed for dry skin., Disp: , Rfl:    enoxaparin  (LOVENOX ) 40 MG/0.4ML injection, Inject 0.4 mLs (40 mg total) into the skin every 12 (twelve) hours., Disp: 8 mL, Rfl: 1   gabapentin  (NEURONTIN ) 100 MG capsule, Take 1 capsule (100 mg total) by mouth at bedtime., Disp: 60 capsule, Rfl: 1   levETIRAcetam  (KEPPRA ) 250 MG tablet, Take 1 tablet (250 mg total) by mouth 2 (two) times daily., Disp: 180 tablet, Rfl: 3   levothyroxine  (SYNTHROID ) 50 MCG tablet, TAKE 1 TABLET (50 MCG TOTAL) BY MOUTH AT BEDTIME., Disp: 90 tablet, Rfl: 0   metFORMIN  (GLUCOPHAGE -XR) 500 MG 24 hr tablet, TAKE 1 TABLET (500 MG TOTAL) BY MOUTH DAILY WITH BREAKFAST., Disp:  90 tablet, Rfl: 1   metoprolol  succinate (TOPROL -XL) 25 MG 24 hr tablet, Take 1 tablet (25 mg total) by mouth daily. Take with or immediately following a meal., Disp: 90 tablet, Rfl: 3   mometasone -formoterol  (DULERA ) 100-5 MCG/ACT AERO, Inhale 2 puffs into the lungs 2 (two) times daily as needed for wheezing or shortness of breath., Disp: , Rfl:    warfarin (COUMADIN ) 2.5 MG tablet, TAKE 2 TO 3 TABLETS DAILY OR AS PRESCRIBED BY COUMADIN  CLINIC, Disp: 70 tablet, Rfl: 2  Past Medical History: Past Medical History:  Diagnosis Date   Atrial fibrillation (HCC)    Coronary artery disease    s/p LAD stents   Diabetes mellitus without complication (HCC)    Dyspnea    Headache    HLD (hyperlipidemia)    Hypertension    Memory loss    due to stroke in 2024   Neuromuscular disorder (HCC)    feet   Pneumonia    x 1   Seizure (HCC)    last one was in 09/2023   Stroke [facility]) 2019   Thyroid disease    Walker as ambulation aid     Tobacco Use: Social History   Tobacco Use  Smoking Status Never   Passive exposure: Never  Smokeless Tobacco Never    Labs: Review Flowsheet  More data exists      Latest Ref Rng & Units 05/15/2023 09/07/2023 09/12/2023 11/27/2023 11/29/2023  Labs for ITP Cardiac and Pulmonary Rehab  Hemoglobin A1c 4.8 - 5.6 % 6.6  - 7.0  5.8  -  PH, Arterial 7.35 - 7.45 - 7.443  - - 7.346  7.300  7.290  7.339  7.394  7.390   PCO2 arterial 32 - 48 mmHg - 39.1  - - 42.4  41.8  46.7  44.4  31.5  36.6   Bicarbonate 20.0 - 28.0 mmol/L - 28.1  28.2  26.8  - - 23.2  20.6  22.7  23.9  25.6  19.3  22.1   TCO2 22 - 32 mmol/L - 29  30  28   - - 25  22  24  25  25  26  27  20  24  22  23    Acid-base deficit 0.0 - 2.0 mmol/L - - - - 2.0  5.0  4.0  2.0  1.0  5.0  2.0   O2 Saturation % - 61  69  91  - - 94  93  97  100  84  100  100     Details       Multiple values from one day are sorted in reverse-chronological order         Capillary Blood Glucose: Lab Results  Component  Value Date   GLUCAP 119 (H) 05/17/2024   GLUCAP 92 05/06/2024   GLUCAP 123 (H) 05/06/2024   GLUCAP 88 05/03/2024   GLUCAP 92 05/03/2024     Exercise Target Goals: Exercise Program Goal: Individual exercise prescription set using results from initial 6 min walk test and THRR while considering  patient's activity barriers and safety.   Exercise Prescription Goal: Initial exercise prescription builds to 30-45 minutes a day of aerobic activity, 2-3 days per week.  Home exercise guidelines will be given to patient during program as part of exercise prescription that the participant will acknowledge.  Activity Barriers & Risk Stratification:  Activity Barriers & Cardiac Risk Stratification - 04/23/24 0831       Activity Barriers & Cardiac Risk Stratification   Activity Barriers Other (comment);Assistive Device;Balance Concerns    Comments CVA- left facial/ left arm deficits. Numbness tingling in hands/ feet.    Cardiac Risk Stratification High          6 Minute Walk:  6 Minute Walk     Row Name 04/25/24 0932         6 Minute Walk   Phase Initial     Distance 1455 feet     Walk Time 6 minutes     # of Rest Breaks 0     MPH 2.76     METS 3.63     RPE 7     Perceived Dyspnea  1     VO2 Peak 12.71     Symptoms Yes (comment)     Comments SOB- resolved with rest. No pain.     Resting HR 87 bpm     Resting BP 106/60     Resting Oxygen Saturation  100 %     Exercise Oxygen Saturation  during 6 min walk 96 %     Max Ex. HR 114 bpm     Max Ex. BP 124/66     2 Minute Post BP 114/68        Oxygen Initial Assessment:   Oxygen Re-Evaluation:   Oxygen Discharge (Final Oxygen Re-Evaluation):   Initial Exercise Prescription:  Initial Exercise Prescription - 04/25/24 0900       Date of Initial Exercise RX and  Referring Provider   Date 04/25/24    Referring Provider Shelda Bruckner, MD    Expected Discharge Date 07/17/24      NuStep   Level 1    SPM 70     Minutes 30    METs 2      Prescription Details   Frequency (times per week) 3    Duration Progress to 30 minutes of continuous aerobic without signs/symptoms of physical distress      Intensity   THRR 40-80% of Max Heartrate 62-124    Ratings of Perceived Exertion 11-13    Perceived Dyspnea 0-4      Progression   Progression Continue progressive overload as per policy without signs/symptoms or physical distress.      Resistance Training   Training Prescription Yes    Weight 1    Reps 10-15          Perform Capillary Blood Glucose checks as needed.  Exercise Prescription Changes:   Exercise Prescription Changes     Row Name 04/29/24 0818 05/15/24 0835 05/29/24 0841         Response to Exercise   Blood Pressure (Admit) 134/66 146/76 140/80     Blood Pressure (Exercise) 116/64 120/84 --     Blood Pressure (Exit) 126/70 138/70 120/72     Heart Rate (Admit) 87 bpm 96 bpm 100 bpm     Heart Rate (Exercise) 108 bpm 109 bpm 117 bpm     Heart Rate (Exit) 81 bpm 90 bpm 97 bpm     Rating of Perceived Exertion (Exercise) 8 9 12      Perceived Dyspnea (Exercise) 0 0 0     Symptoms Lightheaded post Nustep, vital signs stable, Glucose lower 78, gave OJ none none     Comments Pt first day in the [employer] program Pt first day in the [employer] program Reviewed MET's     Duration Progress to 30 minutes of  aerobic without signs/symptoms of physical distress Progress to 30 minutes of  aerobic without signs/symptoms of physical distress Progress to 30 minutes of  aerobic without signs/symptoms of physical distress     Intensity THRR unchanged THRR unchanged THRR unchanged       Progression   Progression Continue to progress workloads to maintain intensity without signs/symptoms of physical distress. Continue to progress workloads to maintain intensity without signs/symptoms of physical distress. Continue to progress workloads to maintain intensity without signs/symptoms of physical  distress.     Average METs 1.7 2.2 3       Resistance Training   Training Prescription No  Symptoms post Nustep no weight today No No     Weight 1 1 2      Reps 10-15 10-15 10-15     Time 10 Minutes 10 Minutes 10 Minutes       NuStep   Level 1 2 2      SPM 60 74 85     Minutes 26 24 35     METs 1.7 2.2 3       Home Exercise Plan   Plans to continue exercise at -- Home (comment) Home (comment)     Frequency -- Add 2 additional days to program exercise sessions. Add 2 additional days to program exercise sessions.     Initial Home Exercises Provided -- 05/15/24 05/15/24        Exercise Comments:   Exercise Comments     Row Name 04/29/24 0825 05/15/24 9157 05/29/24 9156  Exercise Comments Pt first day in the [facility] program. Pt tolerated exercise fair with an agverage MET level of 1.7. Pt felt light headed post Nustep, glucose lower than start at 78, gave OJ. No weights or cool down today. Reviewed MET's, goals and home ExRx. Pt tolerated exercise fair with an agverage MET level of 2.2. She is still having issues with increased fatigue and SOB. Steadly working towards 30 mins of exercise on the nustep. 23 mins today. She is going to add in some exercise on her own by walking 10 mins at a time until she gets fatigued and then work towards the goal of getting three 10 mins sessions so she gets a total of 30 mins 1-2 days a week. Reviewed MET's. Pt tolerated exercise fair with an agverage MET level of 3.0. She is feeling much stronger. She is able to achieve 30 mins at level 2 and has increased her MET's        Exercise Goals and Review:   Exercise Goals     Row Name 04/23/24 0831             Exercise Goals   Increase Physical Activity Yes       Intervention Provide advice, education, support and counseling about physical activity/exercise needs.;Develop an individualized exercise prescription for aerobic and resistive training based on initial evaluation findings, risk  stratification, comorbidities and participant's personal goals.       Expected Outcomes Short Term: Attend rehab on a regular basis to increase amount of physical activity.;Long Term: Exercising regularly at least 3-5 days a week.;Long Term: Add in home exercise to make exercise part of routine and to increase amount of physical activity.       Increase Strength and Stamina Yes       Intervention Provide advice, education, support and counseling about physical activity/exercise needs.;Develop an individualized exercise prescription for aerobic and resistive training based on initial evaluation findings, risk stratification, comorbidities and participant's personal goals.       Expected Outcomes Short Term: Increase workloads from initial exercise prescription for resistance, speed, and METs.;Short Term: Perform resistance training exercises routinely during rehab and add in resistance training at home;Long Term: Improve cardiorespiratory fitness, muscular endurance and strength as measured by increased METs and functional capacity ( )       Able to understand and use rate of perceived exertion (RPE) scale Yes       Intervention Provide education and explanation on how to use RPE scale       Expected Outcomes Long Term:  Able to use RPE to guide intensity level when exercising independently;Short Term: Able to use RPE daily in rehab to express subjective intensity level       Knowledge and understanding of Target Heart Rate Range (THRR) Yes       Intervention Provide education and explanation of THRR including how the numbers were predicted and where they are located for reference       Expected Outcomes Short Term: Able to state/look up THRR;Long Term: Able to use THRR to govern intensity when exercising independently;Short Term: Able to use daily as guideline for intensity in rehab       Able to check pulse independently Yes       Intervention Provide education and demonstration on how to check pulse  in carotid and radial arteries.;Review the importance of being able to check your own pulse for safety during independent exercise       Expected Outcomes Short  Term: Able to explain why pulse checking is important during independent exercise;Long Term: Able to check pulse independently and accurately       Understanding of Exercise Prescription Yes       Intervention Provide education, explanation, and written materials on patient's individual exercise prescription       Expected Outcomes Short Term: Able to explain program exercise prescription;Long Term: Able to explain home exercise prescription to exercise independently          Exercise Goals Re-Evaluation :  Exercise Goals Re-Evaluation     Row Name 04/29/24 0823 05/15/24 0837           Exercise Goal Re-Evaluation   Exercise Goals Review Increase Physical Activity;Understanding of Exercise Prescription;Increase Strength and Stamina;Knowledge and understanding of Target Heart Rate Range (THRR);Able to understand and use rate of perceived exertion (RPE) scale Increase Physical Activity;Understanding of Exercise Prescription;Increase Strength and Stamina;Knowledge and understanding of Target Heart Rate Range (THRR);Able to understand and use rate of perceived exertion (RPE) scale      Comments Pt first day in the [facility] program. Pt tolerated exercise fair with an agverage MET level of 1.7. Pt felt light headed post Nustep, glucose lower than start at 78, gave OJ. No weights or cool down today. Reviewed MET's, goals and home ExRx. Pt tolerated exercise fair with an agverage MET level of 2.2. She is still having issues with increased fatigue and SOB. Steadly working towards 30 mins of exercise on the nustep. 23 mins today. She is going to add in some exercise on her own by walking 10 mins at a time until she gets fatigued and then work towards the goal of getting three 10 mins sessions so she gets a total of 30 mins 1-2 days a week.       Expected Outcomes Will continue to monitor pt Will continue to monitor pt         Discharge Exercise Prescription (Final Exercise Prescription Changes):  Exercise Prescription Changes - 05/29/24 0841       Response to Exercise   Blood Pressure (Admit) 140/80    Blood Pressure (Exit) 120/72    Heart Rate (Admit) 100 bpm    Heart Rate (Exercise) 117 bpm    Heart Rate (Exit) 97 bpm    Rating of Perceived Exertion (Exercise) 12    Perceived Dyspnea (Exercise) 0    Symptoms none    Comments Reviewed MET's    Duration Progress to 30 minutes of  aerobic without signs/symptoms of physical distress    Intensity THRR unchanged      Progression   Progression Continue to progress workloads to maintain intensity without signs/symptoms of physical distress.    Average METs 3      Resistance Training   Training Prescription No    Weight 2    Reps 10-15    Time 10 Minutes      NuStep   Level 2    SPM 85    Minutes 35    METs 3      Home Exercise Plan   Plans to continue exercise at Home (comment)    Frequency Add 2 additional days to program exercise sessions.    Initial Home Exercises Provided 05/15/24          Nutrition:  Target Goals: Understanding of nutrition guidelines, daily intake of sodium 1500mg , cholesterol 200mg , calories 30% from fat and 7% or less from saturated fats, daily to have 5 or more servings of  fruits and vegetables.  Biometrics:  Pre Biometrics - 04/25/24 0930       Pre Biometrics   Waist Circumference 33 inches    Hip Circumference 36.25 inches    Waist to Hip Ratio 0.91 %    Triceps Skinfold 18 mm    % Body Fat 33 %    Grip Strength 4 kg    Flexibility 18 in    Single Leg Stand 3.63 seconds           Nutrition Therapy Plan and Nutrition Goals:  Nutrition Therapy & Goals - 05/29/24 0842       Nutrition Therapy   Diet Heart Healthy Diet    Drug/Food Interactions Statins/Certain Fruits;Coumadin /Vit K      Personal Nutrition Goals    Nutrition Goal Patient to improve diet quality by using the plate method as a guide for meal planning to include lean protein/plant protein, fruits, vegetables, whole grains, nonfat dairy as part of a well-balanced diet.   goal in progress.   Comments Goals in progress. Patient has medical history of s/p MVR 11/28/23, TIA/CVA, afib, DM2. A1c is well controlled in a pre-diabetic range. Most recent lipid panel from 2023 WNL, LDL at goal. She continues regular follow-up with coumadin  clinic. She is up 2.4# since starting with our program. Patient will benefit from participation in intensive cardiac rehab for nutrition education, exercise, and lifestyle modification.      Intervention Plan   Intervention Prescribe, educate and counsel regarding individualized specific dietary modifications aiming towards targeted core components such as weight, hypertension, lipid management, diabetes, heart failure and other comorbidities.;Nutrition handout(s) given to patient.    Expected Outcomes Short Term Goal: Understand basic principles of dietary content, such as calories, fat, sodium, cholesterol and nutrients.;Long Term Goal: Adherence to prescribed nutrition plan.          Nutrition Assessments:  MEDIFICTS Score Key: >=70 Need to make dietary changes  40-70 Heart Healthy Diet <= 40 Therapeutic Level Cholesterol Diet    Picture Your Plate Scores: <59 Unhealthy dietary pattern with much room for improvement. 41-50 Dietary pattern unlikely to meet recommendations for good health and room for improvement. 51-60 More healthful dietary pattern, with some room for improvement.  >60 Healthy dietary pattern, although there may be some specific behaviors that could be improved.    Nutrition Goals Re-Evaluation:  Nutrition Goals Re-Evaluation     Row Name 04/29/24 0856 05/29/24 0842           Goals   Current Weight 100 lb 5 oz (45.5 kg) 102 lb 11.8 oz (46.6 kg)      Comment A1c 5.8, Lpa WNL, lipid panel  from 2023 WNL A1c 5.8, Lpa WNL, lipid panel from 2023 WNL      Expected Outcome Patient has medical history of s/p MVR 11/28/23, TIA/CVA, afib, DM2. A1c is well controlled in a pre-diabetic range. Most recent lipid panel from 2023 WNL, LDL at goal. She continues regular follow-up with coumadin  clinic. Patient will benefit from participation in intensive cardiac rehab for nutrition education, exercise, and lifestyle modification. Goals in progress. Patient has medical history of s/p MVR 11/28/23, TIA/CVA, afib, DM2. A1c is well controlled in a pre-diabetic range. Most recent lipid panel from 2023 WNL, LDL at goal. She continues regular follow-up with coumadin  clinic. She is up 2.4# since starting with our program. Patient will benefit from participation in intensive cardiac rehab for nutrition education, exercise, and lifestyle modification.  Nutrition Goals Re-Evaluation:  Nutrition Goals Re-Evaluation     Row Name 04/29/24 0856 05/29/24 0842           Goals   Current Weight 100 lb 5 oz (45.5 kg) 102 lb 11.8 oz (46.6 kg)      Comment A1c 5.8, Lpa WNL, lipid panel from 2023 WNL A1c 5.8, Lpa WNL, lipid panel from 2023 WNL      Expected Outcome Patient has medical history of s/p MVR 11/28/23, TIA/CVA, afib, DM2. A1c is well controlled in a pre-diabetic range. Most recent lipid panel from 2023 WNL, LDL at goal. She continues regular follow-up with coumadin  clinic. Patient will benefit from participation in intensive cardiac rehab for nutrition education, exercise, and lifestyle modification. Goals in progress. Patient has medical history of s/p MVR 11/28/23, TIA/CVA, afib, DM2. A1c is well controlled in a pre-diabetic range. Most recent lipid panel from 2023 WNL, LDL at goal. She continues regular follow-up with coumadin  clinic. She is up 2.4# since starting with our program. Patient will benefit from participation in intensive cardiac rehab for nutrition education, exercise, and lifestyle modification.          Nutrition Goals Discharge (Final Nutrition Goals Re-Evaluation):  Nutrition Goals Re-Evaluation - 05/29/24 9157       Goals   Current Weight 102 lb 11.8 oz (46.6 kg)    Comment A1c 5.8, Lpa WNL, lipid panel from 2023 WNL    Expected Outcome Goals in progress. Patient has medical history of s/p MVR 11/28/23, TIA/CVA, afib, DM2. A1c is well controlled in a pre-diabetic range. Most recent lipid panel from 2023 WNL, LDL at goal. She continues regular follow-up with coumadin  clinic. She is up 2.4# since starting with our program. Patient will benefit from participation in intensive cardiac rehab for nutrition education, exercise, and lifestyle modification.          Psychosocial: Target Goals: Acknowledge presence or absence of significant depression and/or stress, maximize coping skills, provide positive support system. Participant is able to verbalize types and ability to use techniques and skills needed for reducing stress and depression.  Initial Review & Psychosocial Screening:  Initial Psych Review & Screening - 04/23/24 0854       Initial Review   Current issues with Current Stress Concerns    Comments Concerned about her health and the impact/ burden on her family.      Family Dynamics   Good Support System? Yes    Comments She has good support from her son.      Barriers   Psychosocial barriers to participate in program The patient should benefit from training in stress management and relaxation.;Psychosocial barriers identified (see note)      Screening Interventions   Interventions Encouraged to exercise;To provide support and resources with identified psychosocial needs;Provide feedback about the scores to participant    Expected Outcomes Short Term goal: Utilizing psychosocial counselor, staff and physician to assist with identification of specific Stressors or current issues interfering with healing process. Setting desired goal for each stressor or current issue  identified.;Long Term Goal: Stressors or current issues are controlled or eliminated.;Short Term goal: Identification and review with participant of any Quality of Life or Depression concerns found by scoring the questionnaire.;Long Term goal: The participant improves quality of Life and PHQ9 Scores as seen by post scores and/or verbalization of changes          Quality of Life Scores:  Quality of Life - 04/23/24 1535  Quality of Life   Select Quality of Life      Quality of Life Scores   Health/Function Pre 24.86 %    Socioeconomic Pre 27 %    Psych/Spiritual Pre 30 %    Family Pre 30 %    GLOBAL Pre 27.1 %         Scores of 19 and below usually indicate a poorer quality of life in these areas.  A difference of  2-3 points is a clinically meaningful difference.  A difference of 2-3 points in the total score of the Quality of Life Index has been associated with significant improvement in overall quality of life, self-image, physical symptoms, and general health in studies assessing change in quality of life.  PHQ-9: Review Flowsheet  More data exists      04/23/2024 01/16/2024 11/06/2023 10/12/2023 09/18/2023  Depression screen PHQ 2/9  Decreased Interest 2 0 0 0 0  Down, Depressed, Hopeless 1 0 0 1 1  PHQ - 2 Score 3 0 0 1 1  Altered sleeping 1 0 - 1 0  Tired, decreased energy 2 0 - 3 3  Change in appetite 0 0 - 0 2  Feeling bad or failure about yourself  0 0 - 1 0  Trouble concentrating 0 0 - 1 1  Moving slowly or fidgety/restless 0 0 - 1 1  Suicidal thoughts 0 0 - 0 0  PHQ-9 Score 6 0 - 8 8  Difficult doing work/chores Not difficult at all Not difficult at all - Somewhat difficult Somewhat difficult   Interpretation of Total Score  Total Score Depression Severity:  1-4 = Minimal depression, 5-9 = Mild depression, 10-14 = Moderate depression, 15-19 = Moderately severe depression, 20-27 = Severe depression   Psychosocial Evaluation and Intervention:   Psychosocial  Re-Evaluation:  Psychosocial Re-Evaluation     Row Name 04/30/24 1153 05/31/24 0850           Psychosocial Re-Evaluation   Current issues with Current Stress Concerns Current Stress Concerns      Comments Saamiya did not voice any increased concerns or stressors on her first day of exercise. Will continue to montior and offer supoprt as needed.      Expected Outcomes Athina will have controlled or decreased stressors upon completion of cardiac rehab Will continue to montior and offer supoprt as needed.      Interventions Stress management education;Relaxation education;Encouraged to attend Cardiac Rehabilitation for the exercise Stress management education;Relaxation education;Encouraged to attend Cardiac Rehabilitation for the exercise      Continue Psychosocial Services  Follow up required by staff Follow up required by staff        Initial Review   Source of Stress Concerns Chronic Illness Chronic Illness      Comments will continue to monitor and offer support as needed Will continue to montior and offer supoprt as needed.         Psychosocial Discharge (Final Psychosocial Re-Evaluation):  Psychosocial Re-Evaluation - 05/31/24 0850       Psychosocial Re-Evaluation   Current issues with Current Stress Concerns    Comments Will continue to montior and offer supoprt as needed.    Expected Outcomes Will continue to montior and offer supoprt as needed.    Interventions Stress management education;Relaxation education;Encouraged to attend Cardiac Rehabilitation for the exercise    Continue Psychosocial Services  Follow up required by staff      Initial Review   Source of Stress Concerns Chronic Illness  Comments Will continue to montior and offer supoprt as needed.          Vocational Rehabilitation: Provide vocational rehab assistance to qualifying candidates.   Vocational Rehab Evaluation & Intervention:  Vocational Rehab - 04/23/24 0856       Initial Vocational  Rehab Evaluation & Intervention   Assessment shows need for Vocational Rehabilitation No      Vocational Rehab Re-Evaulation   Comments Retired, no VR needs.          Education: Education Goals: Education classes will be provided on a weekly basis, covering required topics. Participant will state understanding/return demonstration of topics presented.     Core Videos: Exercise    Move It!  Clinical staff conducted group or individual video education with verbal and written material and guidebook.  Patient learns the recommended Pritikin exercise program. Exercise with the goal of living a long, healthy life. Some of the health benefits of exercise include controlled diabetes, healthier blood pressure levels, improved cholesterol levels, improved heart and lung capacity, improved sleep, and better body composition. Everyone should speak with their doctor before starting or changing an exercise routine.  Biomechanical Limitations Clinical staff conducted group or individual video education with verbal and written material and guidebook.  Patient learns how biomechanical limitations can impact exercise and how we can mitigate and possibly overcome limitations to have an impactful and balanced exercise routine.  Body Composition Clinical staff conducted group or individual video education with verbal and written material and guidebook.  Patient learns that body composition (ratio of muscle mass to fat mass) is a key component to assessing overall fitness, rather than body weight alone. Increased fat mass, especially visceral belly fat, can put us  at increased risk for metabolic syndrome, type 2 diabetes, heart disease, and even death. It is recommended to combine diet and exercise (cardiovascular and resistance training) to improve your body composition. Seek guidance from your physician and exercise physiologist before implementing an exercise routine.  Exercise Action Plan Clinical staff  conducted group or individual video education with verbal and written material and guidebook.  Patient learns the recommended strategies to achieve and enjoy long-term exercise adherence, including variety, self-motivation, self-efficacy, and positive decision making. Benefits of exercise include fitness, good health, weight management, more energy, better sleep, less stress, and overall well-being.  Medical   Heart Disease Risk Reduction Clinical staff conducted group or individual video education with verbal and written material and guidebook.  Patient learns our heart is our most vital organ as it circulates oxygen, nutrients, white blood cells, and hormones throughout the entire body, and carries waste away. Data supports a plant-based eating plan like the Pritikin Program for its effectiveness in slowing progression of and reversing heart disease. The video provides a number of recommendations to address heart disease.   Metabolic Syndrome and Belly Fat  Clinical staff conducted group or individual video education with verbal and written material and guidebook.  Patient learns what metabolic syndrome is, how it leads to heart disease, and how one can reverse it and keep it from coming back. You have metabolic syndrome if you have 3 of the following 5 criteria: abdominal obesity, high blood pressure, high triglycerides, low HDL cholesterol, and high blood sugar.  Hypertension and Heart Disease Clinical staff conducted group or individual video education with verbal and written material and guidebook.  Patient learns that high blood pressure, or hypertension, is very common in the [place] . Hypertension is largely due to excessive salt intake,  but other important risk factors include being overweight, physical inactivity, drinking too much alcohol, smoking, and not eating enough potassium from fruits and vegetables. High blood pressure is a leading risk factor for heart attack, stroke,  congestive heart failure, dementia, kidney failure, and premature death. Long-term effects of excessive salt intake include stiffening of the arteries and thickening of heart muscle and organ damage. Recommendations include ways to reduce hypertension and the risk of heart disease.  Diseases of Our Time - Focusing on Diabetes Clinical staff conducted group or individual video education with verbal and written material and guidebook.  Patient learns why the best way to stop diseases of our time is prevention, through food and other lifestyle changes. Medicine (such as prescription pills and surgeries) is often only a Band-Aid on the problem, not a long-term solution. Most common diseases of our time include obesity, type 2 diabetes, hypertension, heart disease, and cancer. The Pritikin Program is recommended and has been proven to help reduce, reverse, and/or prevent the damaging effects of metabolic syndrome.  Nutrition   Overview of the Pritikin Eating Plan  Clinical staff conducted group or individual video education with verbal and written material and guidebook.  Patient learns about the Pritikin Eating Plan for disease risk reduction. The Pritikin Eating Plan emphasizes a wide variety of unrefined, minimally-processed carbohydrates, like fruits, vegetables, whole grains, and legumes. Go, Caution, and Stop food choices are explained. Plant-based and lean animal proteins are emphasized. Rationale provided for low sodium intake for blood pressure control, low added sugars for blood sugar stabilization, and low added fats and oils for coronary artery disease risk reduction and weight management.  Calorie Density  Clinical staff conducted group or individual video education with verbal and written material and guidebook.  Patient learns about calorie density and how it impacts the Pritikin Eating Plan. Knowing the characteristics of the food you choose will help you decide whether those foods will lead  to weight gain or weight loss, and whether you want to consume more or less of them. Weight loss is usually a side effect of the Pritikin Eating Plan because of its focus on low calorie-dense foods.  Label Reading  Clinical staff conducted group or individual video education with verbal and written material and guidebook.  Patient learns about the Pritikin recommended label reading guidelines and corresponding recommendations regarding calorie density, added sugars, sodium content, and whole grains.  Dining Out - Part 1  Clinical staff conducted group or individual video education with verbal and written material and guidebook.  Patient learns that restaurant meals can be sabotaging because they can be so high in calories, fat, sodium, and/or sugar. Patient learns recommended strategies on how to positively address this and avoid unhealthy pitfalls.  Facts on Fats  Clinical staff conducted group or individual video education with verbal and written material and guidebook.  Patient learns that lifestyle modifications can be just as effective, if not more so, as many medications for lowering your risk of heart disease. A Pritikin lifestyle can help to reduce your risk of inflammation and atherosclerosis (cholesterol build-up, or plaque, in the artery walls). Lifestyle interventions such as dietary choices and physical activity address the cause of atherosclerosis. A review of the types of fats and their impact on blood cholesterol levels, along with dietary recommendations to reduce fat intake is also included.  Nutrition Action Plan  Clinical staff conducted group or individual video education with verbal and written material and guidebook.  Patient learns how to incorporate  Pritikin recommendations into their lifestyle. Recommendations include planning and keeping personal health goals in mind as an important part of their success.  Healthy Mind-Set    Healthy Minds, Bodies, Hearts  Clinical  staff conducted group or individual video education with verbal and written material and guidebook.  Patient learns how to identify when they are stressed. Video will discuss the impact of that stress, as well as the many benefits of stress management. Patient will also be introduced to stress management techniques. The way we think, act, and feel has an impact on our hearts.  How Our Thoughts Can Heal Our Hearts  Clinical staff conducted group or individual video education with verbal and written material and guidebook.  Patient learns that negative thoughts can cause depression and anxiety. This can result in negative lifestyle behavior and serious health problems. Cognitive behavioral therapy is an effective method to help control our thoughts in order to change and improve our emotional outlook.  Additional Videos:  Exercise    Improving Performance  Clinical staff conducted group or individual video education with verbal and written material and guidebook.  Patient learns to use a non-linear approach by alternating intensity levels and lengths of time spent exercising to help burn more calories and lose more body fat. Cardiovascular exercise helps improve heart health, metabolism, hormonal balance, blood sugar control, and recovery from fatigue. Resistance training improves strength, endurance, balance, coordination, reaction time, metabolism, and muscle mass. Flexibility exercise improves circulation, posture, and balance. Seek guidance from your physician and exercise physiologist before implementing an exercise routine and learn your capabilities and proper form for all exercise.  Introduction to Yoga  Clinical staff conducted group or individual video education with verbal and written material and guidebook.  Patient learns about yoga, a discipline of the coming together of mind, breath, and body. The benefits of yoga include improved flexibility, improved range of motion, better posture and  core strength, increased lung function, weight loss, and positive self-image. Yoga's heart health benefits include lowered blood pressure, healthier heart rate, decreased cholesterol and triglyceride levels, improved immune function, and reduced stress. Seek guidance from your physician and exercise physiologist before implementing an exercise routine and learn your capabilities and proper form for all exercise.  Medical   Aging: Enhancing Your Quality of Life  Clinical staff conducted group or individual video education with verbal and written material and guidebook.  Patient learns key strategies and recommendations to stay in good physical health and enhance quality of life, such as prevention strategies, having an advocate, securing a Health Care Proxy and Power of Attorney, and keeping a list of medications and system for tracking them. It also discusses how to avoid risk for bone loss.  Biology of Weight Control  Clinical staff conducted group or individual video education with verbal and written material and guidebook.  Patient learns that weight gain occurs because we consume more calories than we burn (eating more, moving less). Even if your body weight is normal, you may have higher ratios of fat compared to muscle mass. Too much body fat puts you at increased risk for cardiovascular disease, heart attack, stroke, type 2 diabetes, and obesity-related cancers. In addition to exercise, following the Pritikin Eating Plan can help reduce your risk.  Decoding Lab Results  Clinical staff conducted group or individual video education with verbal and written material and guidebook.  Patient learns that lab test reflects one measurement whose values change over time and are influenced by many factors, including medication,  stress, sleep, exercise, food, hydration, pre-existing medical conditions, and more. It is recommended to use the knowledge from this video to become more involved with your lab  results and evaluate your numbers to speak with your doctor.   Diseases of Our Time - Overview  Clinical staff conducted group or individual video education with verbal and written material and guidebook.  Patient learns that according to the CDC, 50% to 70% of chronic diseases (such as obesity, type 2 diabetes, elevated lipids, hypertension, and heart disease) are avoidable through lifestyle improvements including healthier food choices, listening to satiety cues, and increased physical activity.  Sleep Disorders Clinical staff conducted group or individual video education with verbal and written material and guidebook.  Patient learns how good quality and duration of sleep are important to overall health and well-being. Patient also learns about sleep disorders and how they impact health along with recommendations to address them, including discussing with a physician.  Nutrition  Dining Out - Part 2 Clinical staff conducted group or individual video education with verbal and written material and guidebook.  Patient learns how to plan ahead and communicate in order to maximize their dining experience in a healthy and nutritious manner. Included are recommended food choices based on the type of restaurant the patient is visiting.   Fueling a Banker conducted group or individual video education with verbal and written material and guidebook.  There is a strong connection between our food choices and our health. Diseases like obesity and type 2 diabetes are very prevalent and are in large-part due to lifestyle choices. The Pritikin Eating Plan provides plenty of food and hunger-curbing satisfaction. It is easy to follow, affordable, and helps reduce health risks.  Menu Workshop  Clinical staff conducted group or individual video education with verbal and written material and guidebook.  Patient learns that restaurant meals can sabotage health goals because they are often  packed with calories, fat, sodium, and sugar. Recommendations include strategies to plan ahead and to communicate with the manager, chef, or server to help order a healthier meal.  Planning Your Eating Strategy  Clinical staff conducted group or individual video education with verbal and written material and guidebook.  Patient learns about the Pritikin Eating Plan and its benefit of reducing the risk of disease. The Pritikin Eating Plan does not focus on calories. Instead, it emphasizes high-quality, nutrient-rich foods. By knowing the characteristics of the foods, we choose, we can determine their calorie density and make informed decisions.  Targeting Your Nutrition Priorities  Clinical staff conducted group or individual video education with verbal and written material and guidebook.  Patient learns that lifestyle habits have a tremendous impact on disease risk and progression. This video provides eating and physical activity recommendations based on your personal health goals, such as reducing LDL cholesterol, losing weight, preventing or controlling type 2 diabetes, and reducing high blood pressure.  Vitamins and Minerals  Clinical staff conducted group or individual video education with verbal and written material and guidebook.  Patient learns different ways to obtain key vitamins and minerals, including through a recommended healthy diet. It is important to discuss all supplements you take with your doctor.   Healthy Mind-Set    Smoking Cessation  Clinical staff conducted group or individual video education with verbal and written material and guidebook.  Patient learns that cigarette smoking and tobacco addiction pose a serious health risk which affects millions of people. Stopping smoking will significantly reduce the risk of  heart disease, lung disease, and many forms of cancer. Recommended strategies for quitting are covered, including working with your doctor to develop a successful  plan.  Culinary   Becoming a Set designer conducted group or individual video education with verbal and written material and guidebook.  Patient learns that cooking at home can be healthy, cost-effective, quick, and puts them in control. Keys to cooking healthy recipes will include looking at your recipe, assessing your equipment needs, planning ahead, making it simple, choosing cost-effective seasonal ingredients, and limiting the use of added fats, salts, and sugars.  Cooking - Breakfast and Snacks  Clinical staff conducted group or individual video education with verbal and written material and guidebook.  Patient learns how important breakfast is to satiety and nutrition through the entire day. Recommendations include key foods to eat during breakfast to help stabilize blood sugar levels and to prevent overeating at meals later in the day. Planning ahead is also a key component.  Cooking - Educational psychologist conducted group or individual video education with verbal and written material and guidebook.  Patient learns eating strategies to improve overall health, including an approach to cook more at home. Recommendations include thinking of animal protein as a side on your plate rather than center stage and focusing instead on lower calorie dense options like vegetables, fruits, whole grains, and plant-based proteins, such as beans. Making sauces in large quantities to freeze for later and leaving the skin on your vegetables are also recommended to maximize your experience.  Cooking - Healthy Salads and Dressing Clinical staff conducted group or individual video education with verbal and written material and guidebook.  Patient learns that vegetables, fruits, whole grains, and legumes are the foundations of the Pritikin Eating Plan. Recommendations include how to incorporate each of these in flavorful and healthy salads, and how to create homemade salad dressings.  Proper handling of ingredients is also covered. Cooking - Soups and [employer] - Soups and Desserts Clinical staff conducted group or individual video education with verbal and written material and guidebook.  Patient learns that Pritikin soups and desserts make for easy, nutritious, and delicious snacks and meal components that are low in sodium, fat, sugar, and calorie density, while high in vitamins, minerals, and filling fiber. Recommendations include simple and healthy ideas for soups and desserts.   Overview     The Pritikin Solution Program Overview Clinical staff conducted group or individual video education with verbal and written material and guidebook.  Patient learns that the results of the Pritikin Program have been documented in more than 100 articles published in peer-reviewed journals, and the benefits include reducing risk factors for (and, in some cases, even reversing) high cholesterol, high blood pressure, type 2 diabetes, obesity, and more! An overview of the three key pillars of the Pritikin Program will be covered: eating well, doing regular exercise, and having a healthy mind-set.  WORKSHOPS  Exercise: Exercise Basics: Building Your Action Plan Clinical staff led group instruction and group discussion with PowerPoint presentation and patient guidebook. To enhance the learning environment the use of posters, models and videos may be added. At the conclusion of this workshop, patients will comprehend the difference between physical activity and exercise, as well as the benefits of incorporating both, into their routine. Patients will understand the FITT (Frequency, Intensity, Time, and Type) principle and how to use it to build an exercise action plan. In addition, safety concerns and other considerations for  exercise and cardiac rehab will be addressed by the presenter. The purpose of this lesson is to promote a comprehensive and effective weekly exercise routine in  order to improve patients' overall level of fitness.   Managing Heart Disease: Your Path to a Healthier Heart Clinical staff led group instruction and group discussion with PowerPoint presentation and patient guidebook. To enhance the learning environment the use of posters, models and videos may be added.At the conclusion of this workshop, patients will understand the anatomy and physiology of the heart. Additionally, they will understand how Pritikin's three pillars impact the risk factors, the progression, and the management of heart disease.  The purpose of this lesson is to provide a high-level overview of the heart, heart disease, and how the Pritikin lifestyle positively impacts risk factors.  Exercise Biomechanics Clinical staff led group instruction and group discussion with PowerPoint presentation and patient guidebook. To enhance the learning environment the use of posters, models and videos may be added. Patients will learn how the structural parts of their bodies function and how these functions impact their daily activities, movement, and exercise. Patients will learn how to promote a neutral spine, learn how to manage pain, and identify ways to improve their physical movement in order to promote healthy living. The purpose of this lesson is to expose patients to common physical limitations that impact physical activity. Participants will learn practical ways to adapt and manage aches and pains, and to minimize their effect on regular exercise. Patients will learn how to maintain good posture while sitting, walking, and lifting.  Balance Training and Fall Prevention  Clinical staff led group instruction and group discussion with PowerPoint presentation and patient guidebook. To enhance the learning environment the use of posters, models and videos may be added. At the conclusion of this workshop, patients will understand the importance of their sensorimotor skills (vision,  proprioception, and the vestibular system) in maintaining their ability to balance as they age. Patients will apply a variety of balancing exercises that are appropriate for their current level of function. Patients will understand the common causes for poor balance, possible solutions to these problems, and ways to modify their physical environment in order to minimize their fall risk. The purpose of this lesson is to teach patients about the importance of maintaining balance as they age and ways to minimize their risk of falling.  WORKSHOPS   Nutrition:  Fueling a Ship broker led group instruction and group discussion with PowerPoint presentation and patient guidebook. To enhance the learning environment the use of posters, models and videos may be added. Patients will review the foundational principles of the Pritikin Eating Plan and understand what constitutes a serving size in each of the food groups. Patients will also learn Pritikin-friendly foods that are better choices when away from home and review make-ahead meal and snack options. Calorie density will be reviewed and applied to three nutrition priorities: weight maintenance, weight loss, and weight gain. The purpose of this lesson is to reinforce (in a group setting) the key concepts around what patients are recommended to eat and how to apply these guidelines when away from home by planning and selecting Pritikin-friendly options. Patients will understand how calorie density may be adjusted for different weight management goals.  Mindful Eating  Clinical staff led group instruction and group discussion with PowerPoint presentation and patient guidebook. To enhance the learning environment the use of posters, models and videos may be added. Patients will briefly review the concepts of  the Pritikin Eating Plan and the importance of low-calorie dense foods. The concept of mindful eating will be introduced as well as the  importance of paying attention to internal hunger signals. Triggers for non-hunger eating and techniques for dealing with triggers will be explored. The purpose of this lesson is to provide patients with the opportunity to review the basic principles of the [facility] Eating Plan, discuss the value of eating mindfully and how to measure internal cues of hunger and fullness using the Hunger Scale. Patients will also discuss reasons for non-hunger eating and learn strategies to use for controlling emotional eating.  Targeting Your Nutrition Priorities Clinical staff led group instruction and group discussion with PowerPoint presentation and patient guidebook. To enhance the learning environment the use of posters, models and videos may be added. Patients will learn how to determine their genetic susceptibility to disease by reviewing their family history. Patients will gain insight into the importance of diet as part of an overall healthy lifestyle in mitigating the impact of genetics and other environmental insults. The purpose of this lesson is to provide patients with the opportunity to assess their personal nutrition priorities by looking at their family history, their own health history and current risk factors. Patients will also be able to discuss ways of prioritizing and modifying the [facility] Eating Plan for their highest risk areas  Menu  Clinical staff led group instruction and group discussion with PowerPoint presentation and patient guidebook. To enhance the learning environment the use of posters, models and videos may be added. Using menus brought in from [employer], or printed from [employer], patients will apply the [facility] dining out guidelines that were presented in the [employer] video. Patients will also be able to practice these guidelines in a variety of provided scenarios. The purpose of this lesson is to provide patients with the opportunity to practice  hands-on learning of the [facility] Dining Out guidelines with actual menus and practice scenarios.  Label Reading Clinical staff led group instruction and group discussion with PowerPoint presentation and patient guidebook. To enhance the learning environment the use of posters, models and videos may be added. Patients will review and discuss the [facility] label reading guidelines presented in [employer]'s Label Reading Educational series video. Using fool labels brought in from local grocery stores and markets, patients will apply the label reading guidelines and determine if the packaged food meet the [facility] guidelines. The purpose of this lesson is to provide patients with the opportunity to review, discuss, and practice hands-on learning of the [facility] Label Reading guidelines with actual packaged food labels. Cooking School  [facility]'s [employer] are designed to teach patients ways to prepare quick, simple, and affordable recipes at home. The importance of nutrition's role in chronic disease risk reduction is reflected in its emphasis in the overall [facility] program. By learning how to prepare essential core [facility] Eating Plan recipes, patients will increase control over what they eat; be able to customize the flavor of foods without the use of added salt, sugar, or fat; and improve the quality of the food they consume. By learning a set of core recipes which are easily assembled, quickly prepared, and affordable, patients are more likely to prepare more healthy foods at home. These workshops focus on convenient breakfasts, simple entres, side dishes, and desserts which can be prepared with minimal effort and are consistent with nutrition recommendations for cardiovascular risk reduction. Cooking [employer] are taught by a Armed forces logistics/support/administrative officer (  RD) who has been trained by the [employer] team. The chef or RD has a clear understanding of the importance of minimizing -  if not completely eliminating - added fat, sugar, and sodium in recipes. Throughout the series of Cooking School Workshop sessions, patients will learn about healthy ingredients and efficient methods of cooking to build confidence in their capability to prepare    Cooking School weekly topics:  Adding Flavor- Sodium-Free  Fast and Healthy Breakfasts  Powerhouse Plant-Based Proteins  Satisfying Salads and Dressings  Simple Sides and Sauces  International Cuisine-Spotlight on the [employer] Zones  Delicious Desserts  Savory Soups  [employer] - Meals in a Astronomer Appetizers and Snacks  Comforting Weekend Breakfasts  One-Pot Wonders   Fast Evening Meals  Landscape architect Your Pritikin Plate  WORKSHOPS   Healthy Mindset (Psychosocial):  Focused Goals, Sustainable Changes Clinical staff led group instruction and group discussion with PowerPoint presentation and patient guidebook. To enhance the learning environment the use of posters, models and videos may be added. Patients will be able to apply effective goal setting strategies to establish at least one personal goal, and then take consistent, meaningful action toward that goal. They will learn to identify common barriers to achieving personal goals and develop strategies to overcome them. Patients will also gain an understanding of how our mind-set can impact our ability to achieve goals and the importance of cultivating a positive and growth-oriented mind-set. The purpose of this lesson is to provide patients with a deeper understanding of how to set and achieve personal goals, as well as the tools and strategies needed to overcome common obstacles which may arise along the way.  From Head to Heart: The Power of a Healthy Outlook  Clinical staff led group instruction and group discussion with PowerPoint presentation and patient guidebook. To enhance the learning environment the use of posters, models and videos may be  added. Patients will be able to recognize and describe the impact of emotions and mood on physical health. They will discover the importance of self-care and explore self-care practices which may work for them. Patients will also learn how to utilize the 4 C's to cultivate a healthier outlook and better manage stress and challenges. The purpose of this lesson is to demonstrate to patients how a healthy outlook is an essential part of maintaining good health, especially as they continue their cardiac rehab journey.  Healthy Sleep for a Healthy Heart Clinical staff led group instruction and group discussion with PowerPoint presentation and patient guidebook. To enhance the learning environment the use of posters, models and videos may be added. At the conclusion of this workshop, patients will be able to demonstrate knowledge of the importance of sleep to overall health, well-being, and quality of life. They will understand the symptoms of, and treatments for, common sleep disorders. Patients will also be able to identify daytime and nighttime behaviors which impact sleep, and they will be able to apply these tools to help manage sleep-related challenges. The purpose of this lesson is to provide patients with a general overview of sleep and outline the importance of quality sleep. Patients will learn about a few of the most common sleep disorders. Patients will also be introduced to the concept of "sleep hygiene," and discover ways to self-manage certain sleeping problems through simple daily behavior changes. Finally, the workshop will motivate patients by clarifying the links between quality sleep and their goals of heart-healthy living.   Recognizing and  Reducing Stress Clinical staff led group instruction and group discussion with PowerPoint presentation and patient guidebook. To enhance the learning environment the use of posters, models and videos may be added. At the conclusion of this workshop, patients  will be able to understand the types of stress reactions, differentiate between acute and chronic stress, and recognize the impact that chronic stress has on their health. They will also be able to apply different coping mechanisms, such as reframing negative self-talk. Patients will have the opportunity to practice a variety of stress management techniques, such as deep abdominal breathing, progressive muscle relaxation, and/or guided imagery.  The purpose of this lesson is to educate patients on the role of stress in their lives and to provide healthy techniques for coping with it.  Learning Barriers/Preferences:  Learning Barriers/Preferences - 04/23/24 0856       Learning Barriers/Preferences   Learning Barriers Language;Sight    Learning Preferences Individual Instruction          Education Topics:  Knowledge Questionnaire Score:  Knowledge Questionnaire Score - 04/23/24 1546       Knowledge Questionnaire Score   Pre Score 6/24 completed with interpreter          Core Components/Risk Factors/Patient Goals at Admission:  Personal Goals and Risk Factors at Admission - 04/23/24 0857       Core Components/Risk Factors/Patient Goals on Admission   Diabetes Yes    Intervention Provide education about signs/symptoms and action to take for hypo/hyperglycemia.;Provide education about proper nutrition, including hydration, and aerobic/resistive exercise prescription along with prescribed medications to achieve blood glucose in normal ranges: Fasting glucose 65-99 mg/dL    Expected Outcomes Short Term: Participant verbalizes understanding of the signs/symptoms and immediate care of hyper/hypoglycemia, proper foot care and importance of medication, aerobic/resistive exercise and nutrition plan for blood glucose control.;Long Term: Attainment of HbA1C < 7%.    Hypertension Yes    Intervention Provide education on lifestyle modifcations including regular physical activity/exercise, weight  management, moderate sodium restriction and increased consumption of fresh fruit, vegetables, and low fat dairy, alcohol moderation, and smoking cessation.;Monitor prescription use compliance.    Expected Outcomes Short Term: Continued assessment and intervention until BP is < 140/64mm HG in hypertensive participants. < 130/70mm HG in hypertensive participants with diabetes, heart failure or chronic kidney disease.;Long Term: Maintenance of blood pressure at goal levels.    Lipids Yes    Intervention Provide education and support for participant on nutrition & aerobic/resistive exercise along with prescribed medications to achieve LDL 70mg , HDL >40mg .    Expected Outcomes Short Term: Participant states understanding of desired cholesterol values and is compliant with medications prescribed. Participant is following exercise prescription and nutrition guidelines.;Long Term: Cholesterol controlled with medications as prescribed, with individualized exercise RX and with personalized nutrition plan. Value goals: LDL < 70mg , HDL > 40 mg.          Core Components/Risk Factors/Patient Goals Review:   Goals and Risk Factor Review     Row Name 04/30/24 1156 05/31/24 0854           Core Components/Risk Factors/Patient Goals Review   Personal Goals Review Weight Management/Obesity;Lipids;Hypertension;Diabetes;Stress Diabetes;Hypertension;Lipids      Review Malley completed exercise on 04/29/24. Vital signs were stable. CBG 78 post c/o feeling lightheaded juice given symptoms resolved. Robinette completed exercise on 04/29/24. Vital signs have been stable.  Has gained approximately 1kg over the last 30 days.      Expected Outcomes Amenda will continue to participate  in cardiac rehab for exercise, nutrition and lifestyle modifications Mckaylin will continue to participate in cardiac rehab for exercise, nutrition and lifestyle modifications         Core Components/Risk Factors/Patient Goals at Discharge  (Final Review):   Goals and Risk Factor Review - 05/31/24 0854       Core Components/Risk Factors/Patient Goals Review   Personal Goals Review Diabetes;Hypertension;Lipids    Review Capricia completed exercise on 04/29/24. Vital signs have been stable.  Has gained approximately 1kg over the last 30 days.    Expected Outcomes Melba will continue to participate in cardiac rehab for exercise, nutrition and lifestyle modifications          ITP Comments:  ITP Comments     Row Name 04/23/24 0801 04/30/24 1149 05/31/24 0843       ITP Comments Medical Director- Dr. Wilbert Bihari, MD. Introduction to the [facility] Education / Intensive Cardiac Rehab Program. Review of initial orientation folder. 30 Day ITP Review. Marchelle started cardiac rehab on 04/29/24. Rosario did fair with exercise. Elsye did report feeling lightheaded after getting of the nustep. Given juiice for a CBG of 78 post exercise. Sabree was able to complete her session without further complaints. 30 Day ITP Review. Lynlee started cardiac rehab on 04/29/24.  Pt has good attendance with cardiac rehab        Comments: See ITP comments

## 2024-06-03 ENCOUNTER — Encounter (HOSPITAL_COMMUNITY)

## 2024-06-04 NOTE — Progress Notes (Signed)
 [facility] [address] [place] [address] [phone]       HOSPITAL FOLLOW UP NOTE  Christy Poole Date of Birth:  [birth year] Medical Record Number:  [id number]    Primary neurologist: Dr. Rosemarie Reason for visit: Seizure and stroke follow-up    SUBJECTIVE:   CHIEF COMPLAINT:  Chief Complaint  Patient presents with   Transient Ischemic Attack    Rm 3 with son and grandchild Pt is well and stable, reports no new stroke or sz concerns since last visit.     HPI:   Update 06/05/2024 JM: Patient returns for follow-up visit accompanied by her son who assists with interpretation.  Overall stable from neurological standpoint.  Remains on Keppra  and denies any known seizure activity.  No new stroke/TIA symptoms.  Reports cognition has been gradually declining since prior visit, primarily with short-term memory. Has been doing memory exercises at home more recently. She has also increased physical activity and currently working with cardiac rehab. Sleeping has improved since she has increased her exercise.  Son mentions that she does complain occasionally of mild headaches that resolve on their own but she has also been complaining of decreased visual acuity and more eye strain which may be contributing, son plans on scheduling visit with ophthalmology.  She has also been experiencing some fatigue especially after exertion. Did undergo mitral valve replacement with mechanical mitral valve and LAA closure back in February, continues to be closely followed by cardiology.  She remains on warfarin and aspirin  and is closely followed by Coumadin  clinic.  Continue close follow-up with PCP and has PCP f/u next week      History provided for reference purposes only Update 11/22/2023 JM: Patient returns for follow-up visit accompanied by her son who provides history/interpretation.  Denies any seizure activity or new stroke/TIA symptoms.  Remains on Keppra  without  side effects.  Remains on warfarin and atorvastatin  without side effects.  At prior visit, complained of lower extremity nocturnal paresthesias and felt likely related to diabetic neuropathy and also complained of memory difficulties which was likely from prior stroke.  Neuropathy and dementia panel unremarkable.  She was started on gabapentin  100 mg nightly at prior visit for neuropathy and PCP increased to 100 mg 3 times daily as needed last month, has been taking TID which has been helpful. Son believes memory has continued to decline, more issues with short term memory.  She lives with her son, he needs to remind her to take medications and to eat at times, family will provide supervision for ADLs but he does encourage her to be as independent as able and as safety permits. She is relatively sedentary but son tries to encourage routine physical activity. 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  She has had increased stress and anxiety regarding this procedure.  No further questions or concerns at this time.  Update 07/12/2023 Dr. Rosemarie: She returns for follow-up after last visit 6 months ago.  She is accompanied by her son and granddaughter.  Patient continues to do well from neurovascular standpoint without recurrent stroke or TIA symptoms.  She remains on Coumadin  which is tolerating well with minor bruising and no bleeding.  Last INR on 07/07/2023 was 2.0.  She is tolerating Lipitor  well without muscle aches and pains and states her diabetes is under good control and last hemoglobin A1c on 05/15/2023 was 6.6.  Patient remains  on Keppra  which is tolerating well without side effects but she has had no episodes of staring or seizure-like events.  The patient is complaining today about paresthesias in the feet particularly at night and she has trouble sleeping because of this.  She is able to ambulate independently but  her balance is not good and she has to walk slowly and uses a walker.  She had 1 fall in the last 6 months but no injuries.  The patient has had some mild memory difficulties since her stroke but the son feels that may be getting worse.  She has become more forgetful.  She occasionally forgets to take them meds.  Family needs to remind her constantly.  She is otherwise quite independent in activities of daily living.  She continues to have mild left body paresthesia since her stroke as well as complaints of getting tired very easily.   Update 01/10/2023 Dr. Rosemarie: She returns for follow-up after last visit 4 months ago.  She is accompanied by her son who provides most of the history and translate for her.  Patient has done well after starting Keppra  and has not had any further episodes of staring or altered consciousness since December 2023.  She is tolerating Keppra  well without side effects.  She has had no recurrent stroke or TIA symptoms.  She remains on warfarin and does bruise easily and at times forgets to take her medications.  Her INR has been quite fluctuating.  She complains of of intermittent mild headaches as well as she is having some trouble reading.  She has not yet seen an ophthalmologist for evaluation for cataracts.  She had EEG on 11 /15/2023 which was normal..  Hemoglobin A1c was 6.4.   Update 08/24/2022 Dr. Rosemarie: Patient is seen for follow-up today after last visit with Harlene NP .She is accompanied by her son.   They inform us  about a new complaint today that she has had multiple episodes of brief alteredLoses consciousness and some of them and tries to follow normal heart itself.  Most of the episodes he has awakened looking at her eyes and is staring and has some stiffness of the extremities this lasted about 5 to 10 minutes he is quite tired after that.  She has not been evaluated for seizures yet.  Most of these episodes occurred at night but will buckle during the day as well.  She  was seen in the hospital in February for 2  episodes 1 week apart that she had lost consciousness and subsequently has noted some increased left-sided weakness.  MRI scan of the brain on 12/09/2021 had shown old right MCA infarct but no acute abnormalities.  CT angiogram neck showing no large vessel stenosis or occlusion repeat CT scan of the head on 12/15/2021 had also shown no acute abnormalities.  EEG was not ordered.  Patient was diagnosed with rheumatic mitral stenosis and 2017 subsequently had atrial fibrillation.  He was not on anticoagulation until 2019 when she had a large right MCA stroke.  He is tolerating warfarin well with INR which has been fairly stable.  She denies bruising bleeding or other side effects.    Initial visit 01/27/2022 JM: Patient being seen for hospital follow up accompanied by her son who assists with translation and providing majority of history. Overall stable from stroke standpoint.  Denies new or reoccurring stroke/TIA symptoms.  Continued left-sided weakness and short-term memory difficulties but at baseline. Continues to work with [facility] PT. Ambulates without AD,  no recent falls.  Son mentions syncopal episodes prior to her stroke in 2019 and about 25 minutes prior to recent TIA events. No additional syncopal events since that time. Also mentions headaches over the past 6-7 months, present on both sides of head and on top in pressure/band type sensation. Denies photophobia, phonophobia or N/V. Will take tylenol  with resolution. Lives with son and daughter-in-law who assists with ADLs and majority of IADLs.  Remains on aspirin  and warfarin, denies side effects.  Routinely monitored by Coumadin  clinic and has since establish care with cardiologist Dr. Lonni.  Remains on atorvastatin , denies side effects.  Blood pressure today 131/74.  No further concerns at this time.   Stroke admission 12/08/2021 [name] is a 63 y.o. 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  Noted symptoms wax and wane.  Personally reviewed hospitalization pertinent progress notes, lab work and imaging.  Evaluated by Dr. Jerri for likely right brain TIA likely due to A-fib on Coumadin  with lower and INR level. CTH and MR brain negative for acute stroke, noted chronic moderate size cortical/subcortical right MCA territory infarct.  CTA head/neck unremarkable.  EF 45 to 50%.  LDL 36.  A1c 6.6.  Recommend continuation of warfarin and aspirin  with increased INR goal at 2.5-3.0.  Continuation of atorvastatin  80 mg daily.  She was discharged home with recommended home health PT/OT.  Return to ED 2/22 with recurrence of left facial and left arm weakness lasting 5 to 10 minutes then resolved.  Repeat CT head no acute findings.  Diagnosed with TIA.  No further work-up completed as recently completed for similar symptoms.  First passing out, about 25 min after symptoms started Similar to second time        PERTINENT IMAGING  Per hospitalization 12/08/2021 CT no acute abnormality MRI no acute infarct, chronic moderate-sized cortical/subcortical right MCA territory infarct CT head and neck unremarkable 2D Echo EF 45 to 50% LDL 36 HgbA1c 6.6    ROS:   14 system review of systems performed and negative with exception of those listed in HPI  PMH:  Past Medical History:  Diagnosis Date   Atrial fibrillation (HCC)    Coronary artery disease    s/p LAD stents   Diabetes mellitus without complication (HCC)    Dyspnea    Headache    HLD (hyperlipidemia)    Hypertension    Memory loss    due to stroke in 2024   Neuromuscular disorder (HCC)    feet   Pneumonia    x 1   Seizure (HCC)    last one was in 09/2023   Stroke [facility]) 2019   Thyroid  disease    Walker as ambulation aid     PSH:  Past Surgical History:  Procedure Laterality Date   MITRAL VALVE REPLACEMENT N/A 11/29/2023   Procedure: MITRAL VALVE (MV) REPLACEMENT USING ST JUDE MASTERS MECHANICAL VALVE;  Surgeon: Maryjane Mt, MD;  Location: [facility] OR;  Service: Open Heart Surgery;  Laterality: N/A;   RIGHT/LEFT HEART CATH AND CORONARY ANGIOGRAPHY N/A 09/07/2023   Procedure: RIGHT/LEFT HEART CATH AND CORONARY ANGIOGRAPHY;  Surgeon: Wendel Lurena POUR, MD;  Location: [facility] INVASIVE CV LAB;  Service: Cardiovascular;  Laterality: N/A;   TEE WITHOUT CARDIOVERSION N/A 11/29/2023  Procedure: TRANSESOPHAGEAL ECHOCARDIOGRAM (TEE);  Surgeon: Maryjane Mt, MD;  Location: [facility] OR;  Service: Open Heart Surgery;  Laterality: N/A;   TRANSESOPHAGEAL ECHOCARDIOGRAM (CATH LAB) N/A 10/03/2023   Procedure: TRANSESOPHAGEAL ECHOCARDIOGRAM;  Surgeon: Raford Riggs, MD;  Location: [facility] INVASIVE CV LAB;  Service: Cardiovascular;  Laterality: N/A;    Social History:  Social History   Socioeconomic History   Marital status: Widowed    Spouse name: Not on file   Number of children: Not on file   Years of education: Not on file   Highest education level: GED or equivalent  Occupational History   Not on file  Tobacco Use   Smoking status: Never    Passive exposure: Never   Smokeless tobacco: Never  Vaping Use   Vaping status: Never Used  Substance and Sexual Activity   Alcohol use: Never   Drug use: Never   Sexual activity: Not Currently    Birth control/protection: Post-menopausal  Other Topics Concern   Not on file  Social History Narrative   Not on file   Social Drivers of Health   Financial Resource Strain: Patient Declined (10/12/2023)   Overall Financial Resource Strain (CARDIA)    Difficulty of Paying Living Expenses: Patient declined  Recent Concern: Financial Resource Strain - High Risk (08/30/2023)   Overall Financial Resource Strain (CARDIA)    Difficulty of Paying Living  Expenses: Hard  Food Insecurity: Patient Declined (10/12/2023)   Hunger Vital Sign    Worried About Running Out of Food in the Last Year: Patient declined    Ran Out of Food in the Last Year: Patient declined  Recent Concern: Food Insecurity - Food Insecurity Present (08/30/2023)   Hunger Vital Sign    Worried About Running Out of Food in the Last Year: Often true    Ran Out of Food in the Last Year: Often true  Transportation Needs: Patient Declined (10/12/2023)   PRAPARE - Administrator, Civil Service (Medical): Patient declined    Lack of Transportation (Non-Medical): Patient declined  Recent Concern: Transportation Needs - Unmet Transportation Needs (08/30/2023)   PRAPARE - Administrator, Civil Service (Medical): Yes    Lack of Transportation (Non-Medical): Yes  Physical Activity: Unknown (08/30/2023)   Exercise Vital Sign    Days of Exercise per Week: 0 days    Minutes of Exercise per Session: Not on file  Recent Concern: Physical Activity - Inactive (08/30/2023)   Exercise Vital Sign    Days of Exercise per Week: 0 days    Minutes of Exercise per Session: 10 min  Stress: Stress Concern Present (08/30/2023)   [employer] of Occupational Health - Occupational Stress Questionnaire    Feeling of Stress : Very much  Social Connections: Socially Isolated (08/30/2023)   Social Connection and Isolation Panel    Frequency of Communication with Friends and Family: Once a week    Frequency of Social Gatherings with Friends and Family: Three times a week    Attends Religious Services: Never    Active Member of Clubs or Organizations: No    Attends Banker Meetings: Not on file    Marital Status: Widowed  Intimate Partner Violence: Patient Unable To Answer (11/29/2023)   Humiliation, Afraid, Rape, and Kick questionnaire    Fear of Current or Ex-Partner: Patient unable to answer    Emotionally Abused: Patient unable to answer    Physically Abused:  Patient unable to answer    Sexually Abused: Patient  unable to answer    Family History: History reviewed. No pertinent family history.  Medications:   Current Outpatient Medications on File Prior to Visit  Medication Sig Dispense Refill   acetaminophen  (TYLENOL ) 500 MG tablet Take 500 mg by mouth every 6 (six) hours as needed for mild pain (pain score 1-3), headache or fever.     albuterol  (VENTOLIN  HFA) 108 (90 Base) MCG/ACT inhaler Inhale 1-2 puffs into the lungs every 4 (four) hours as needed for wheezing or shortness of breath. 1 each 2   aspirin  EC (ASPIRIN  LOW DOSE) 81 MG tablet TAKE 1 TABLET (81 MG TOTAL) BY MOUTH DAILY. (SWALLOW WHOLE) 90 tablet 3   atorvastatin  (LIPITOR ) 80 MG tablet TAKE 1 TABLET (80 MG TOTAL) BY MOUTH DAILY. 90 tablet 3   cetaphil (CETAPHIL) lotion Apply 1 Application topically daily as needed for dry skin.     enoxaparin  (LOVENOX ) 40 MG/0.4ML injection Inject 0.4 mLs (40 mg total) into the skin every 12 (twelve) hours. 8 mL 1   gabapentin  (NEURONTIN ) 100 MG capsule Take 1 capsule (100 mg total) by mouth at bedtime. 60 capsule 1   levETIRAcetam  (KEPPRA ) 250 MG tablet Take 1 tablet (250 mg total) by mouth 2 (two) times daily. 180 tablet 3   levothyroxine  (SYNTHROID ) 50 MCG tablet TAKE 1 TABLET (50 MCG TOTAL) BY MOUTH AT BEDTIME. 90 tablet 0   metFORMIN  (GLUCOPHAGE -XR) 500 MG 24 hr tablet TAKE 1 TABLET (500 MG TOTAL) BY MOUTH DAILY WITH BREAKFAST. 90 tablet 1   metoprolol  succinate (TOPROL -XL) 25 MG 24 hr tablet Take 1 tablet (25 mg total) by mouth daily. Take with or immediately following a meal. 90 tablet 3   mometasone -formoterol  (DULERA ) 100-5 MCG/ACT AERO Inhale 2 puffs into the lungs 2 (two) times daily as needed for wheezing or shortness of breath.     warfarin (COUMADIN ) 2.5 MG tablet TAKE 2 TO 3 TABLETS DAILY OR AS PRESCRIBED BY COUMADIN  CLINIC 70 tablet 2   No current facility-administered medications on file prior to visit.    Allergies:   Allergies   Allergen Reactions   Other Other (See Comments)    Patient is vegetarian no meat can eat eggs      OBJECTIVE:  Physical Exam  Vitals:   06/05/24 1314  BP: 125/70  Pulse: 95  Weight: 101 lb (45.8 kg)  Height: 4' 11 (1.499 m)   Body mass index is 20.4 kg/m. No results found.  General: well developed, well nourished, pleasant middle aged female, seated, in no evident distress  Neurologic Exam Mental Status: Awake and fully alert. limited to no english - per son, mild dysarthria chronic. Denies aphasia. Oriented to place and time. Recent memory impaired and remote memory intact. Son provides majority of hx due to language barrier. Mood and affect appropriate.  Cranial Nerves: Pupils equal, briskly reactive to light. Extraocular movements full without nystagmus. Visual fields unable to test due to difficulty understanding directions, does blink to threat bilaterally. Hearing intact. Facial sensation intact. Slight left lower facial weakness.  Motor: Normal bulk and tone. Normal strength on right side. mild 4/5 left sided weakness chronic  Sensory.: intact to touch , pinprick , position and vibratory sensation.  Coordination: Rapid alternating movements normal in all extremities except slightly decreased left hand. Finger-to-nose and heel-to-shin performed accurately bilaterally. Gait and Station: Arises from chair without difficulty. Stance is normal. Gait demonstrates normal stride length and with mild imbalance without use of AD. Unable to complete tandem walk and heel  toe.  Reflexes: 1+ and symmetric. Toes downgoing.        ASSESSMENT: Christy Poole is a 63 y.o. year old female right brain TIA on 12/08/2021 and 12/15/2021 likely due to A-fib on Coumadin  with lower and INR level. Vascular risk factors include history of right MCA stroke with residual left-sided weakness and facial weakness, hx of rheumatic mitral valve stenosis s/p valve replacement and A-fib s/p LAA in 11/2023,  CAD s/p stent 2016, HLD and new dx of DM. She has also had multiple recurrent episodes of transient altered awareness with staring into space with stiffening of her extremities lasting 5 to 10 minutes followed by tiredness occurring once every 2 months, suspected setting of complex partial seizures which have shown response to Keppra .  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  Memory loss and mild cognitive difficulties poststroke likely due to mild cognitive impairment as well.      PLAN:  Seizures, late effect of stroke: No additional seizure activity Continue Keppra  250 mg twice daily - refills provided Son questioned on going need of medication. Would recommend she continue as she is a risk of recurrent seizure activity with prior stroke history. Did discuss possibly trial of stopping ASM in the next couple of years if they wish but discuss possible repercussions of recurrent seizures Advised to call with any seizure activity  MCI: Continued gradual decline per son, suspect multifactorial from history of prior stroke, multiple comorbidities, recent cardiac surgery, polypharmacy, possible underlying anxiety/depression and sedentary lifestyle Discussed importance of routine memory exercises as well as physical activity/exercise  Neuropathy Has been stable without progression Continue gabapentin  100 mg 3 times daily as needed per PCP  TIA:  Hx of R MCA stroke Residual deficit: Left-sided weakness, stable. Continue warfarin daily, aspirin  and atorvastatin  80 mg daily for secondary stroke prevention, CAD s/p stent, rheumatic mitral valve stenosis s/p replacement and A-fib.   Discussed secondary stroke prevention measures and importance of close PCP follow up for aggressive stroke risk factor management including BP goal<130/90, HLD with LDL goal<70 and DM with A1c.<7 I have gone over the pathophysiology of stroke, warning signs and  symptoms, risk factors and their management in some detail with instructions to go to the closest emergency room for symptoms of concern.  Mild headache: Tension type headache over the past 3-6 months, occurs 1-2x per week, resolves without intervention and not debilitating Encouraged f/u with ophthalmology for visual examination as decreased visual acuity possibly contributing Neuro exam intact, do not feel repeat MRI imaging indicated at this time but can consider if headaches worsen or associated with red flag symptoms Discussed conservative measures at home such as ensuring good sleep, healthy diet, routine physical exercise and adequate water intake Advised to call if headaches should worsen      Follow-up in 1 year or call earlier if needed      I personally spent a total of 40 minutes in the care of the patient today including preparing to see the patient, performing a medically appropriate exam/evaluation, counseling and educating, placing orders, and documenting clinical information in the EHR.   Harlene Bogaert, AGNP-BC  [facility] [address] [place], [place] [address]  Phone [phone] Fax [phone] Note: This document was prepared with digital dictation and possible smart phrase technology. Any transcriptional errors that result from this process are unintentional.

## 2024-06-05 ENCOUNTER — Ambulatory Visit: Payer: Medicaid Other | Admitting: Adult Health

## 2024-06-05 ENCOUNTER — Encounter (HOSPITAL_COMMUNITY)
Admission: RE | Admit: 2024-06-05 | Discharge: 2024-06-05 | Disposition: A | Discharge status: Home / Self Care | Source: Ambulatory Visit | Attending: Cardiology | Admitting: Cardiology

## 2024-06-05 ENCOUNTER — Ambulatory Visit (INDEPENDENT_AMBULATORY_CARE_PROVIDER_SITE_OTHER)

## 2024-06-05 ENCOUNTER — Encounter: Payer: Self-pay | Admitting: Adult Health

## 2024-06-05 VITALS — BP 125/70 | HR 95 | Ht 59.0 in | Wt 101.0 lb

## 2024-06-05 DIAGNOSIS — Z8673 Personal history of transient ischemic attack (TIA), and cerebral infarction without residual deficits: Secondary | ICD-10-CM | POA: Diagnosis not present

## 2024-06-05 DIAGNOSIS — I69398 Other sequelae of cerebral infarction: Secondary | ICD-10-CM

## 2024-06-05 DIAGNOSIS — R569 Unspecified convulsions: Secondary | ICD-10-CM

## 2024-06-05 DIAGNOSIS — I4821 Permanent atrial fibrillation: Secondary | ICD-10-CM | POA: Insufficient documentation

## 2024-06-05 DIAGNOSIS — G459 Transient cerebral ischemic attack, unspecified: Secondary | ICD-10-CM | POA: Diagnosis not present

## 2024-06-05 DIAGNOSIS — R519 Headache, unspecified: Secondary | ICD-10-CM | POA: Diagnosis not present

## 2024-06-05 DIAGNOSIS — Z7901 Long term (current) use of anticoagulants: Secondary | ICD-10-CM | POA: Insufficient documentation

## 2024-06-05 DIAGNOSIS — Z952 Presence of prosthetic heart valve: Secondary | ICD-10-CM | POA: Diagnosis not present

## 2024-06-05 DIAGNOSIS — G3184 Mild cognitive impairment, so stated: Secondary | ICD-10-CM

## 2024-06-05 LAB — POCT INR: INR: 1.6 — AB (ref 2.0–3.0)

## 2024-06-05 MED ORDER — LEVETIRACETAM 250 MG PO TABS: 250.0000 mg | ORAL_TABLET | Freq: Two times a day (BID) | ORAL | 3 refills | Status: AC

## 2024-06-05 NOTE — Patient Instructions (Addendum)
 Your Plan:  Continue Keppra  250 mg twice daily for seizure prevention  Continue close follow-up with PCP and cardiology as scheduled for stroke risk factor management  Would recommend follow up with your eye doctor for vision evaluation. Continue to monitor headaches, please call if this should worsen     Follow up in 1 year or call earlier if needed      Thank you for coming to see us  at [facility]. I hope we have been able to provide you high quality care today.  You may receive a patient satisfaction survey over the next few weeks. We would appreciate your feedback and comments so that we may continue to improve ourselves and the health of our patients.

## 2024-06-05 NOTE — Patient Instructions (Signed)
 Description    Take 3 tablets today and tomorrow, then START taking warfarin 2 tablets daily, except 3 tablets on Tuesdays and Thursdays.   Recheck INR in 1 week. Coumadin  Clinic [phone]

## 2024-06-05 NOTE — Progress Notes (Signed)
 INR 1.6; Please see anticoagulation encounter

## 2024-06-07 ENCOUNTER — Encounter (HOSPITAL_COMMUNITY)
Admission: RE | Admit: 2024-06-07 | Discharge: 2024-06-07 | Disposition: A | Discharge status: Home / Self Care | Source: Ambulatory Visit | Attending: Cardiology | Admitting: Cardiology

## 2024-06-07 DIAGNOSIS — Z952 Presence of prosthetic heart valve: Secondary | ICD-10-CM | POA: Diagnosis not present

## 2024-06-07 LAB — GLUCOSE, CAPILLARY: Glucose-Capillary: 158 mg/dL — ABNORMAL HIGH (ref 70–99)

## 2024-06-10 ENCOUNTER — Encounter (HOSPITAL_COMMUNITY)
Admission: RE | Admit: 2024-06-10 | Discharge: 2024-06-10 | Disposition: A | Discharge status: Home / Self Care | Source: Ambulatory Visit | Attending: Cardiology | Admitting: Cardiology

## 2024-06-10 DIAGNOSIS — Z952 Presence of prosthetic heart valve: Secondary | ICD-10-CM

## 2024-06-11 ENCOUNTER — Encounter (HOSPITAL_BASED_OUTPATIENT_CLINIC_OR_DEPARTMENT_OTHER): Payer: Self-pay | Admitting: Family Medicine

## 2024-06-11 ENCOUNTER — Ambulatory Visit (INDEPENDENT_AMBULATORY_CARE_PROVIDER_SITE_OTHER): Admitting: Family Medicine

## 2024-06-11 ENCOUNTER — Ambulatory Visit (HOSPITAL_BASED_OUTPATIENT_CLINIC_OR_DEPARTMENT_OTHER): Payer: Self-pay | Admitting: Family Medicine

## 2024-06-11 VITALS — BP 131/80 | HR 69 | Ht <= 58 in | Wt 102.4 lb

## 2024-06-11 DIAGNOSIS — E1122 Type 2 diabetes mellitus with diabetic chronic kidney disease: Secondary | ICD-10-CM | POA: Diagnosis not present

## 2024-06-11 DIAGNOSIS — N182 Chronic kidney disease, stage 2 (mild): Secondary | ICD-10-CM

## 2024-06-11 LAB — POCT GLYCOSYLATED HEMOGLOBIN (HGB A1C)
HbA1c POC (<> result, manual entry): 6 % (ref 4.0–5.6)
HbA1c, POC (controlled diabetic range): 6 % (ref 0.0–7.0)
HbA1c, POC (prediabetic range): 6 % (ref 5.7–6.4)
Hemoglobin A1C: 6 % — AB (ref 4.0–5.6)

## 2024-06-11 NOTE — Progress Notes (Signed)
    Procedures performed today:    None.  Independent interpretation of notes and tests performed by another provider:   None.  Brief History, Exam, Impression, and Recommendations:    BP 131/80 (BP Location: Right Arm, Patient Position: Sitting, Cuff Size: Normal)   Pulse 69   Ht 4' 9 (1.448 m)   Wt 102 lb 6.4 oz (46.4 kg)   SpO2 96%   BMI 22.16 kg/m   Type 2 diabetes mellitus with stage 2 chronic kidney disease, without long-term current use of insulin  [facility]) Assessment & Plan: Patient is generally doing well.  Denies any specific concerns today.  Continues with metformin , denies any significant GI symptoms or side effects from medication. Prior hemoglobin A1c has been well-controlled.  Due for recheck at this time. Will also check urine ACR today.  Foot exam completed today, documented in chart. Will plan to follow-up in about 4 to 6 months or sooner as needed  Orders: -     Microalbumin / creatinine urine ratio -     POCT glycosylated hemoglobin (Hb A1C)  Patient has had recent issues with mild, intermittent headaches.  She does follow with neurology and did recently have appointment with them.  Did discuss concerns with neurologist.  Recommendation was for proceeding with ophthalmology evaluation due to concerns related to visual acuity.  Agree with neurology recommendation.  If headaches persist, recommend arranging for sooner follow-up with neurology  Recommend receiving influenza vaccine once available - high dose  Return in about 4 months (around 10/11/2024) for diabetes, 40 minutes.   ___________________________________________ Christy [place], MD, ABFM, CAQSM Primary Care and Sports Medicine [facility]

## 2024-06-11 NOTE — Patient Instructions (Signed)

## 2024-06-11 NOTE — Assessment & Plan Note (Signed)
 Patient is generally doing well.  Denies any specific concerns today.  Continues with metformin , denies any significant GI symptoms or side effects from medication. Prior hemoglobin A1c has been well-controlled.  Due for recheck at this time. Will also check urine ACR today.  Foot exam completed today, documented in chart. Will plan to follow-up in about 4 to 6 months or sooner as needed

## 2024-06-12 ENCOUNTER — Encounter (HOSPITAL_COMMUNITY)
Admission: RE | Admit: 2024-06-12 | Discharge: 2024-06-12 | Disposition: A | Discharge status: Home / Self Care | Source: Ambulatory Visit | Attending: Cardiology | Admitting: Cardiology

## 2024-06-12 DIAGNOSIS — Z952 Presence of prosthetic heart valve: Secondary | ICD-10-CM

## 2024-06-14 ENCOUNTER — Encounter (HOSPITAL_COMMUNITY)
Admission: RE | Admit: 2024-06-14 | Discharge: 2024-06-14 | Disposition: A | Discharge status: Home / Self Care | Source: Ambulatory Visit | Attending: Cardiology | Admitting: Cardiology

## 2024-06-14 ENCOUNTER — Ambulatory Visit (INDEPENDENT_AMBULATORY_CARE_PROVIDER_SITE_OTHER)

## 2024-06-14 DIAGNOSIS — I4821 Permanent atrial fibrillation: Secondary | ICD-10-CM | POA: Insufficient documentation

## 2024-06-14 DIAGNOSIS — Z952 Presence of prosthetic heart valve: Secondary | ICD-10-CM | POA: Diagnosis not present

## 2024-06-14 DIAGNOSIS — Z7901 Long term (current) use of anticoagulants: Secondary | ICD-10-CM | POA: Insufficient documentation

## 2024-06-14 DIAGNOSIS — Z8673 Personal history of transient ischemic attack (TIA), and cerebral infarction without residual deficits: Secondary | ICD-10-CM | POA: Insufficient documentation

## 2024-06-14 LAB — POCT INR: INR: 4.4 — AB (ref 2.0–3.0)

## 2024-06-14 NOTE — Progress Notes (Signed)
 Description   INR 4.4:HOLD Warfarin today and then resume taking warfarin 2 tablets daily, except 3 tablets on Tuesdays and Thursdays.   Recheck INR in 1 week. [facility]inic [phone]

## 2024-06-14 NOTE — Patient Instructions (Addendum)
 Description   INR 4.4:HOLD Warfarin today and then resume taking warfarin 2 tablets daily, except 3 tablets on Tuesdays and Thursdays.   Recheck INR in 1 week. [facility]  Clinic [phone]

## 2024-06-17 ENCOUNTER — Other Ambulatory Visit: Payer: Self-pay | Admitting: Cardiology

## 2024-06-17 ENCOUNTER — Encounter (HOSPITAL_COMMUNITY)
Admission: RE | Admit: 2024-06-17 | Discharge: 2024-06-17 | Disposition: A | Discharge status: Home / Self Care | Source: Ambulatory Visit | Attending: Cardiology | Admitting: Cardiology

## 2024-06-17 DIAGNOSIS — Z952 Presence of prosthetic heart valve: Secondary | ICD-10-CM | POA: Diagnosis not present

## 2024-06-17 DIAGNOSIS — I050 Rheumatic mitral stenosis: Secondary | ICD-10-CM

## 2024-06-17 DIAGNOSIS — I4821 Permanent atrial fibrillation: Secondary | ICD-10-CM

## 2024-06-19 ENCOUNTER — Encounter (HOSPITAL_COMMUNITY)
Admission: RE | Admit: 2024-06-19 | Discharge: 2024-06-19 | Disposition: A | Discharge status: Home / Self Care | Source: Ambulatory Visit | Attending: Cardiology | Admitting: Cardiology

## 2024-06-19 ENCOUNTER — Other Ambulatory Visit (HOSPITAL_BASED_OUTPATIENT_CLINIC_OR_DEPARTMENT_OTHER): Payer: Self-pay | Admitting: Family Medicine

## 2024-06-19 DIAGNOSIS — Z952 Presence of prosthetic heart valve: Secondary | ICD-10-CM | POA: Diagnosis not present

## 2024-06-19 DIAGNOSIS — E119 Type 2 diabetes mellitus without complications: Secondary | ICD-10-CM

## 2024-06-21 ENCOUNTER — Ambulatory Visit (INDEPENDENT_AMBULATORY_CARE_PROVIDER_SITE_OTHER)

## 2024-06-21 ENCOUNTER — Encounter (HOSPITAL_COMMUNITY)
Admission: RE | Admit: 2024-06-21 | Discharge: 2024-06-21 | Disposition: A | Discharge status: Home / Self Care | Source: Ambulatory Visit | Attending: Cardiology | Admitting: Cardiology

## 2024-06-21 DIAGNOSIS — Z7901 Long term (current) use of anticoagulants: Secondary | ICD-10-CM | POA: Insufficient documentation

## 2024-06-21 DIAGNOSIS — I4821 Permanent atrial fibrillation: Secondary | ICD-10-CM | POA: Insufficient documentation

## 2024-06-21 DIAGNOSIS — Z8673 Personal history of transient ischemic attack (TIA), and cerebral infarction without residual deficits: Secondary | ICD-10-CM | POA: Insufficient documentation

## 2024-06-21 DIAGNOSIS — Z952 Presence of prosthetic heart valve: Secondary | ICD-10-CM | POA: Diagnosis not present

## 2024-06-21 LAB — POCT INR: INR: 4.6 — AB (ref 2.0–3.0)

## 2024-06-21 NOTE — Patient Instructions (Signed)
 HOLD Warfarin today and tomorrow and then resume taking warfarin 2 tablets daily, except 3 tablets on Tuesdays and Thursdays.  Eat 3 helpings of greens per week Recheck INR in 2 week. [facility]madin  Clinic [phone]

## 2024-06-21 NOTE — Progress Notes (Signed)
 INR 4.6 Please see anticoagulation encounter HOLD Warfarin today and tomorrow and then resume taking warfarin 2 tablets daily, except 3 tablets on Tuesdays and Thursdays.  Eat 3 helpings of greens per week Recheck INR in 2 week. Coumadin  Clinic [phone]

## 2024-06-26 ENCOUNTER — Encounter (HOSPITAL_COMMUNITY)
Admission: RE | Admit: 2024-06-26 | Discharge: 2024-06-26 | Disposition: A | Discharge status: Home / Self Care | Source: Ambulatory Visit | Attending: Cardiology | Admitting: Cardiology

## 2024-06-26 DIAGNOSIS — Z48812 Encounter for surgical aftercare following surgery on the circulatory system: Secondary | ICD-10-CM | POA: Diagnosis not present

## 2024-06-26 DIAGNOSIS — Z952 Presence of prosthetic heart valve: Secondary | ICD-10-CM | POA: Diagnosis present

## 2024-06-28 ENCOUNTER — Encounter (HOSPITAL_COMMUNITY)
Admission: RE | Admit: 2024-06-28 | Discharge: 2024-06-28 | Disposition: A | Discharge status: Home / Self Care | Source: Ambulatory Visit | Attending: Cardiology | Admitting: Cardiology

## 2024-06-28 DIAGNOSIS — Z952 Presence of prosthetic heart valve: Secondary | ICD-10-CM | POA: Diagnosis not present

## 2024-07-01 ENCOUNTER — Encounter (HOSPITAL_COMMUNITY)
Admission: RE | Admit: 2024-07-01 | Discharge: 2024-07-01 | Disposition: A | Discharge status: Home / Self Care | Source: Ambulatory Visit | Attending: Cardiology

## 2024-07-01 DIAGNOSIS — Z952 Presence of prosthetic heart valve: Secondary | ICD-10-CM

## 2024-07-01 NOTE — Progress Notes (Signed)
 Cardiac Individual Treatment Plan  Patient Details  Name: Christy Poole MRN: [id number] Date of Birth: [birth year] Referring Provider:   Flowsheet Row INTENSIVE CARDIAC REHAB ORIENT from 04/25/2024 in [facility]  Referring Provider Shelda Bruckner, MD    Initial Encounter Date:  Flowsheet Row INTENSIVE CARDIAC REHAB ORIENT from 04/25/2024 in [facility]  Date 04/25/24    Visit Diagnosis: 11/28/23 MVR (mitral valve replacement)  Patient's Home Medications on Admission:  Current Outpatient Medications:    acetaminophen  (TYLENOL ) 500 MG tablet, Take 500 mg by mouth every 6 (six) hours as needed for mild pain (pain score 1-3), headache or fever., Disp: , Rfl:    albuterol  (VENTOLIN  HFA) 108 (90 Base) MCG/ACT inhaler, Inhale 1-2 puffs into the lungs every 4 (four) hours as needed for wheezing or shortness of breath., Disp: 1 each, Rfl: 2   ASPIRIN  LOW DOSE 81 MG tablet, TAKE 1 TABLET (81 MG TOTAL) BY MOUTH DAILY. (SWALLOW WHOLE), Disp: 90 tablet, Rfl: 3   atorvastatin  (LIPITOR ) 80 MG tablet, TAKE 1 TABLET (80 MG TOTAL) BY MOUTH DAILY., Disp: 90 tablet, Rfl: 3   cetaphil (CETAPHIL) lotion, Apply 1 Application topically daily as needed for dry skin., Disp: , Rfl:    gabapentin  (NEURONTIN ) 100 MG capsule, Take 1 capsule (100 mg total) by mouth at bedtime., Disp: 60 capsule, Rfl: 1   levETIRAcetam  (KEPPRA ) 250 MG tablet, Take 1 tablet (250 mg total) by mouth 2 (two) times daily., Disp: 180 tablet, Rfl: 3   levothyroxine  (SYNTHROID ) 50 MCG tablet, TAKE 1 TABLET (50 MCG TOTAL) BY MOUTH AT BEDTIME., Disp: 90 tablet, Rfl: 0   metFORMIN  (GLUCOPHAGE -XR) 500 MG 24 hr tablet, TAKE 1 TABLET (500 MG TOTAL) BY MOUTH DAILY WITH BREAKFAST., Disp: 90 tablet, Rfl: 1   metoprolol  succinate (TOPROL -XL) 25 MG 24 hr tablet, Take 1 tablet (25 mg total) by mouth daily. Take with or immediately  following a meal., Disp: 90 tablet, Rfl: 3   mometasone -formoterol  (DULERA ) 100-5 MCG/ACT AERO, Inhale 2 puffs into the lungs 2 (two) times daily as needed for wheezing or shortness of breath., Disp: , Rfl:    warfarin (COUMADIN ) 2.5 MG tablet, TAKE 2 TO 3 TABLETS DAILY OR AS PRESCRIBED BY COUMADIN  CLINIC, Disp: 70 tablet, Rfl: 2  Past Medical History: Past Medical History:  Diagnosis Date   Atrial fibrillation (HCC)    Coronary artery disease    s/p LAD stents   Diabetes mellitus without complication (HCC)    Dyspnea    Headache    HLD (hyperlipidemia)    Hypertension    Memory loss    due to stroke in 2024   Neuromuscular disorder (HCC)    feet   Pneumonia    x 1   Seizure (HCC)    last one was in 09/2023   Stroke (HCC) 2019   Thyroid disease    Walker as ambulation aid     Tobacco Use: Social History   Tobacco Use  Smoking Status Never   Passive exposure: Never  Smokeless Tobacco Never    Labs: Review Flowsheet  More data exists      Latest Ref Rng & Units 09/07/2023 09/12/2023 11/27/2023 11/29/2023 06/11/2024  Labs for ITP Cardiac and Pulmonary Rehab  Hemoglobin A1c 5.7 - 6.4 % 0.0 - 7.0 % 4.0 - 5.6 % 4.0 - 5.6 % - 7.0  5.8  - 6.0  6.0  6.0  6.0   PH, Arterial 7.35 - 7.45 7.443  - - 7.346  7.300  7.290  7.339  7.394  7.390  -  PCO2 arterial 32 - 48 mmHg 39.1  - - 42.4  41.8  46.7  44.4  31.5  36.6  -  Bicarbonate 20.0 - 28.0 mmol/L 28.1  28.2  26.8  - - 23.2  20.6  22.7  23.9  25.6  19.3  22.1  -  TCO2 22 - 32 mmol/L 29  30  28   - - 25  22  24  25  25  26  27  20  24  22  23   -  Acid-base deficit 0.0 - 2.0 mmol/L - - - 2.0  5.0  4.0  2.0  1.0  5.0  2.0  -  O2 Saturation % 61  69  91  - - 94  93  97  100  84  100  100  -    Details       Multiple values from one day are sorted in reverse-chronological order          Exercise Target Goals: Exercise Program Goal: Individual exercise prescription set using results from initial 6 min walk test and THRR  while considering  patient's activity barriers and safety.   Exercise Prescription Goal: Initial exercise prescription builds to 30-45 minutes a day of aerobic activity, 2-3 days per week.  Home exercise guidelines will be given to patient during program as part of exercise prescription that the participant will acknowledge.   Education: Aerobic Exercise: - Group verbal and visual presentation on the components of exercise prescription. Introduces F.I.T.T principle from ACSM for exercise prescriptions.  Reviews F.I.T.T. principles of aerobic exercise including progression. Written material provided at class time.   Education: Resistance Exercise: - Group verbal and visual presentation on the components of exercise prescription. Introduces F.I.T.T principle from ACSM for exercise prescriptions  Reviews F.I.T.T. principles of resistance exercise including progression. Written material provided at class time.    Education: Exercise & Equipment Safety: - Individual verbal instruction and demonstration of equipment use and safety with use of the equipment.   Education: Exercise Physiology & General Exercise Guidelines: - Group verbal and written instruction with models to review the exercise physiology of the cardiovascular system and associated critical values. Provides general exercise guidelines with specific guidelines to those with heart or lung disease. Written material provided at class time.   Education: Flexibility, Balance, Mind/Body Relaxation: - Group verbal and visual presentation with interactive activity on the components of exercise prescription. Introduces F.I.T.T principle from ACSM for exercise prescriptions. Reviews F.I.T.T. principles of flexibility and balance exercise training including progression. Also discusses the mind body connection.  Reviews various relaxation techniques to help reduce and manage stress (i.e. Deep breathing, progressive muscle relaxation, and  visualization). Balance handout provided to take home. Written material provided at class time.   Activity Barriers & Risk Stratification:  Activity Barriers & Cardiac Risk Stratification - 04/23/24 0831       Activity Barriers & Cardiac Risk Stratification   Activity Barriers Other (comment);Assistive Device;Balance Concerns    Comments CVA- left facial/ left arm deficits. Numbness tingling in hands/ feet.    Cardiac Risk Stratification High          6 Minute Walk:  6 Minute Walk     Row Name 04/25/24 0932         6 Minute Walk   Phase Initial  Distance 1455 feet     Walk Time 6 minutes     # of Rest Breaks 0     MPH 2.76     METS 3.63     RPE 7     Perceived Dyspnea  1     VO2 Peak 12.71     Symptoms Yes (comment)     Comments SOB- resolved with rest. No pain.     Resting HR 87 bpm     Resting BP 106/60     Resting Oxygen Saturation  100 %     Exercise Oxygen Saturation  during 6 min walk 96 %     Max Ex. HR 114 bpm     Max Ex. BP 124/66     2 Minute Post BP 114/68        Oxygen Initial Assessment:   Oxygen Re-Evaluation:   Oxygen Discharge (Final Oxygen Re-Evaluation):   Initial Exercise Prescription:  Initial Exercise Prescription - 04/25/24 0900       Date of Initial Exercise RX and Referring Provider   Date 04/25/24    Referring Provider Shelda Bruckner, MD    Expected Discharge Date 07/17/24      NuStep   Level 1    SPM 70    Minutes 30    METs 2      Prescription Details   Frequency (times per week) 3    Duration Progress to 30 minutes of continuous aerobic without signs/symptoms of physical distress      Intensity   THRR 40-80% of Max Heartrate 62-124    Ratings of Perceived Exertion 11-13    Perceived Dyspnea 0-4      Progression   Progression Continue progressive overload as per policy without signs/symptoms or physical distress.      Resistance Training   Training Prescription Yes    Weight 1    Reps 10-15           Perform Capillary Blood Glucose checks as needed.  Exercise Prescription Changes:   Exercise Prescription Changes     Row Name 04/29/24 0818 05/15/24 0835 05/29/24 0841 06/10/24 0957 06/26/24 0834     Response to Exercise   Blood Pressure (Admit) 134/66 146/76 140/80 130/70 136/70   Blood Pressure (Exercise) 116/64 120/84 -- -- --   Blood Pressure (Exit) 126/70 138/70 120/72 130/74 96/64   Heart Rate (Admit) 87 bpm 96 bpm 100 bpm 76 bpm 76 bpm   Heart Rate (Exercise) 108 bpm 109 bpm 117 bpm 115 bpm 130 bpm   Heart Rate (Exit) 81 bpm 90 bpm 97 bpm 79 bpm 95 bpm   Rating of Perceived Exertion (Exercise) 8 9 12 7 12    Perceived Dyspnea (Exercise) 0 0 0 0 0   Symptoms Lightheaded post Nustep, vital signs stable, Glucose lower 78, gave OJ none none none none   Comments Pt first day in the [employer] program Pt first day in the [employer] program Reviewed MET's Reviewed MET's and goals Reviewed MET's   Duration Progress to 30 minutes of  aerobic without signs/symptoms of physical distress Progress to 30 minutes of  aerobic without signs/symptoms of physical distress Progress to 30 minutes of  aerobic without signs/symptoms of physical distress Progress to 30 minutes of  aerobic without signs/symptoms of physical distress Progress to 30 minutes of  aerobic without signs/symptoms of physical distress   Intensity THRR unchanged THRR unchanged THRR unchanged THRR unchanged THRR unchanged     Progression  Progression Continue to progress workloads to maintain intensity without signs/symptoms of physical distress. Continue to progress workloads to maintain intensity without signs/symptoms of physical distress. Continue to progress workloads to maintain intensity without signs/symptoms of physical distress. Continue to progress workloads to maintain intensity without signs/symptoms of physical distress. Continue to progress workloads to maintain intensity without signs/symptoms of physical  distress.   Average METs 1.7 2.2 3 2.9 3.4     Resistance Training   Training Prescription No  Symptoms post Nustep no weight today No No Yes No   Weight 1 1 2 2 2    Reps 10-15 10-15 10-15 10-15 10-15   Time 10 Minutes 10 Minutes 10 Minutes 10 Minutes 10 Minutes     NuStep   Level 1 2 2 3 3    SPM 60 74 85 77 76   Minutes 26 24 35 30 30   METs 1.7 2.2 3 2.9 3.4     Home Exercise Plan   Plans to continue exercise at -- Home (comment) Home (comment) Home (comment) Home (comment)   Frequency -- Add 2 additional days to program exercise sessions. Add 2 additional days to program exercise sessions. Add 2 additional days to program exercise sessions. Add 2 additional days to program exercise sessions.   Initial Home Exercises Provided -- 05/15/24 05/15/24 05/15/24 05/15/24      Exercise Comments:   Exercise Comments     Row Name 04/29/24 0825 05/15/24 0842 05/29/24 0843 06/10/24 1001 06/26/24 0838   Exercise Comments Pt first day in the [facility] program. Pt tolerated exercise fair with an agverage MET level of 1.7. Pt felt light headed post Nustep, glucose lower than start at 78, gave OJ. No weights or cool down today. Reviewed MET's, goals and home ExRx. Pt tolerated exercise fair with an agverage MET level of 2.2. She is still having issues with increased fatigue and SOB. Steadly working towards 30 mins of exercise on the nustep. 23 mins today. She is going to add in some exercise on her own by walking 10 mins at a time until she gets fatigued and then work towards the goal of getting three 10 mins sessions so she gets a total of 30 mins 1-2 days a week. Reviewed MET's. Pt tolerated exercise fair with an agverage MET level of 3.0. She is feeling much stronger. She is able to achieve 30 mins at level 2 and has increased her MET's Reviewed MET's and goals. Pt tolerated exercise fair with an agverage MET level of 2.9. She continues with increased fatigue and SOB, but is mildly better. She is  able to achieve 30 mins almost every day now. Small improvements but overall doing well Reviewed MET's. Pt tolerated exercise fair with an agverage MET level of 3.4. She is doing very well and continuing to increase MET's and WL's    Row Name 06/27/24 0837           Exercise Comments --          Exercise Goals and Review:   Exercise Goals     Row Name 04/23/24 0831             Exercise Goals   Increase Physical Activity Yes       Intervention Provide advice, education, support and counseling about physical activity/exercise needs.;Develop an individualized exercise prescription for aerobic and resistive training based on initial evaluation findings, risk stratification, comorbidities and participant's personal goals.       Expected Outcomes Short Term:  Attend rehab on a regular basis to increase amount of physical activity.;Long Term: Exercising regularly at least 3-5 days a week.;Long Term: Add in home exercise to make exercise part of routine and to increase amount of physical activity.       Increase Strength and Stamina Yes       Intervention Provide advice, education, support and counseling about physical activity/exercise needs.;Develop an individualized exercise prescription for aerobic and resistive training based on initial evaluation findings, risk stratification, comorbidities and participant's personal goals.       Expected Outcomes Short Term: Increase workloads from initial exercise prescription for resistance, speed, and METs.;Short Term: Perform resistance training exercises routinely during rehab and add in resistance training at home;Long Term: Improve cardiorespiratory fitness, muscular endurance and strength as measured by increased METs and functional capacity ( )       Able to understand and use rate of perceived exertion (RPE) scale Yes       Intervention Provide education and explanation on how to use RPE scale       Expected Outcomes Long Term:  Able to use  RPE to guide intensity level when exercising independently;Short Term: Able to use RPE daily in rehab to express subjective intensity level       Knowledge and understanding of Target Heart Rate Range (THRR) Yes       Intervention Provide education and explanation of THRR including how the numbers were predicted and where they are located for reference       Expected Outcomes Short Term: Able to state/look up THRR;Long Term: Able to use THRR to govern intensity when exercising independently;Short Term: Able to use daily as guideline for intensity in rehab       Able to check pulse independently Yes       Intervention Provide education and demonstration on how to check pulse in carotid and radial arteries.;Review the importance of being able to check your own pulse for safety during independent exercise       Expected Outcomes Short Term: Able to explain why pulse checking is important during independent exercise;Long Term: Able to check pulse independently and accurately       Understanding of Exercise Prescription Yes       Intervention Provide education, explanation, and written materials on patient's individual exercise prescription       Expected Outcomes Short Term: Able to explain program exercise prescription;Long Term: Able to explain home exercise prescription to exercise independently          Exercise Goals Re-Evaluation :  Exercise Goals Re-Evaluation     Row Name 04/29/24 0823 05/15/24 0837 06/10/24 0959         Exercise Goal Re-Evaluation   Exercise Goals Review Increase Physical Activity;Understanding of Exercise Prescription;Increase Strength and Stamina;Knowledge and understanding of Target Heart Rate Range (THRR);Able to understand and use rate of perceived exertion (RPE) scale Increase Physical Activity;Understanding of Exercise Prescription;Increase Strength and Stamina;Knowledge and understanding of Target Heart Rate Range (THRR);Able to understand and use rate of perceived  exertion (RPE) scale Increase Physical Activity;Understanding of Exercise Prescription;Increase Strength and Stamina;Knowledge and understanding of Target Heart Rate Range (THRR);Able to understand and use rate of perceived exertion (RPE) scale     Comments Pt first day in the [facility] program. Pt tolerated exercise fair with an agverage MET level of 1.7. Pt felt light headed post Nustep, glucose lower than start at 78, gave OJ. No weights or cool down today. Reviewed MET's, goals and home ExRx.  Pt tolerated exercise fair with an agverage MET level of 2.2. She is still having issues with increased fatigue and SOB. Steadly working towards 30 mins of exercise on the nustep. 23 mins today. She is going to add in some exercise on her own by walking 10 mins at a time until she gets fatigued and then work towards the goal of getting three 10 mins sessions so she gets a total of 30 mins 1-2 days a week. Reviewed MET's and goals. Pt tolerated exercise fair with an agverage MET level of 2.9. She continues with increased fatigue and SOB, but is mildly better. She is able to achieve 30 mins almost every day now. Small improvements but overall doing well     Expected Outcomes Will continue to monitor pt Will continue to monitor pt Will continue to monitor pt        Discharge Exercise Prescription (Final Exercise Prescription Changes):  Exercise Prescription Changes - 06/26/24 0834       Response to Exercise   Blood Pressure (Admit) 136/70    Blood Pressure (Exit) 96/64    Heart Rate (Admit) 76 bpm    Heart Rate (Exercise) 130 bpm    Heart Rate (Exit) 95 bpm    Rating of Perceived Exertion (Exercise) 12    Perceived Dyspnea (Exercise) 0    Symptoms none    Comments Reviewed MET's    Duration Progress to 30 minutes of  aerobic without signs/symptoms of physical distress    Intensity THRR unchanged      Progression   Progression Continue to progress workloads to maintain intensity without signs/symptoms  of physical distress.    Average METs 3.4      Resistance Training   Training Prescription No    Weight 2    Reps 10-15    Time 10 Minutes      NuStep   Level 3    SPM 76    Minutes 30    METs 3.4      Home Exercise Plan   Plans to continue exercise at Home (comment)    Frequency Add 2 additional days to program exercise sessions.    Initial Home Exercises Provided 05/15/24          Nutrition:  Target Goals: Understanding of nutrition guidelines, daily intake of sodium 1500mg , cholesterol 200mg , calories 30% from fat and 7% or less from saturated fats, daily to have 5 or more servings of fruits and vegetables.  Education: Nutrition 1 -Group instruction provided by verbal, written material, interactive activities, discussions, models, and posters to present general guidelines for heart healthy nutrition including macronutrients, label reading, and promoting whole foods over processed counterparts. Education serves as Pensions consultant of discussion of heart healthy eating for all. Written material provided at class time.    Education: Nutrition 2 -Group instruction provided by verbal, written material, interactive activities, discussions, models, and posters to present general guidelines for heart healthy nutrition including sodium, cholesterol, and saturated fat. Providing guidance of habit forming to improve blood pressure, cholesterol, and body weight. Written material provided at class time.     Biometrics:  Pre Biometrics - 04/25/24 0930       Pre Biometrics   Waist Circumference 33 inches    Hip Circumference 36.25 inches    Waist to Hip Ratio 0.91 %    Triceps Skinfold 18 mm    % Body Fat 33 %    Grip Strength 4 kg    Flexibility 18 in  Single Leg Stand 3.63 seconds           Nutrition Therapy Plan and Nutrition Goals:  Nutrition Therapy & Goals - 05/29/24 0842       Nutrition Therapy   Diet Heart Healthy Diet    Drug/Food Interactions Statins/Certain  Fruits;Coumadin /Vit K      Personal Nutrition Goals   Nutrition Goal Patient to improve diet quality by using the plate method as a guide for meal planning to include lean protein/plant protein, fruits, vegetables, whole grains, nonfat dairy as part of a well-balanced diet.   goal in progress.   Comments Goals in progress. Patient has medical history of s/p MVR 11/28/23, TIA/CVA, afib, DM2. A1c is well controlled in a pre-diabetic range. Most recent lipid panel from 2023 WNL, LDL at goal. She continues regular follow-up with coumadin  clinic. She is up 2.4# since starting with our program. Patient will benefit from participation in intensive cardiac rehab for nutrition education, exercise, and lifestyle modification.      Intervention Plan   Intervention Prescribe, educate and counsel regarding individualized specific dietary modifications aiming towards targeted core components such as weight, hypertension, lipid management, diabetes, heart failure and other comorbidities.;Nutrition handout(s) given to patient.    Expected Outcomes Short Term Goal: Understand basic principles of dietary content, such as calories, fat, sodium, cholesterol and nutrients.;Long Term Goal: Adherence to prescribed nutrition plan.          Nutrition Assessments:  MEDIFICTS Score Key: >=70 Need to make dietary changes  40-70 Heart Healthy Diet <= 40 Therapeutic Level Cholesterol Diet   Picture Your Plate Scores: <59 Unhealthy dietary pattern with much room for improvement. 41-50 Dietary pattern unlikely to meet recommendations for good health and room for improvement. 51-60 More healthful dietary pattern, with some room for improvement.  >60 Healthy dietary pattern, although there may be some specific behaviors that could be improved.    Nutrition Goals Re-Evaluation:  Nutrition Goals Re-Evaluation     Row Name 04/29/24 0856 05/29/24 0842           Goals   Current Weight 100 lb 5 oz (45.5 kg) 102 lb 11.8  oz (46.6 kg)      Comment A1c 5.8, Lpa WNL, lipid panel from 2023 WNL A1c 5.8, Lpa WNL, lipid panel from 2023 WNL      Expected Outcome Patient has medical history of s/p MVR 11/28/23, TIA/CVA, afib, DM2. A1c is well controlled in a pre-diabetic range. Most recent lipid panel from 2023 WNL, LDL at goal. She continues regular follow-up with coumadin  clinic. Patient will benefit from participation in intensive cardiac rehab for nutrition education, exercise, and lifestyle modification. Goals in progress. Patient has medical history of s/p MVR 11/28/23, TIA/CVA, afib, DM2. A1c is well controlled in a pre-diabetic range. Most recent lipid panel from 2023 WNL, LDL at goal. She continues regular follow-up with coumadin  clinic. She is up 2.4# since starting with our program. Patient will benefit from participation in intensive cardiac rehab for nutrition education, exercise, and lifestyle modification.         Nutrition Goals Discharge (Final Nutrition Goals Re-Evaluation):  Nutrition Goals Re-Evaluation - 05/29/24 9157       Goals   Current Weight 102 lb 11.8 oz (46.6 kg)    Comment A1c 5.8, Lpa WNL, lipid panel from 2023 WNL    Expected Outcome Goals in progress. Patient has medical history of s/p MVR 11/28/23, TIA/CVA, afib, DM2. A1c is well controlled in a pre-diabetic range. Most  recent lipid panel from 2023 WNL, LDL at goal. She continues regular follow-up with coumadin  clinic. She is up 2.4# since starting with our program. Patient will benefit from participation in intensive cardiac rehab for nutrition education, exercise, and lifestyle modification.          Psychosocial: Target Goals: Acknowledge presence or absence of significant depression and/or stress, maximize coping skills, provide positive support system. Participant is able to verbalize types and ability to use techniques and skills needed for reducing stress and depression.   Education: Stress, Anxiety, and Depression - Group verbal and  visual presentation to define topics covered.  Reviews how body is impacted by stress, anxiety, and depression.  Also discusses healthy ways to reduce stress and to treat/manage anxiety and depression. Written material provided at class time.   Education: Sleep Hygiene -Provides group verbal and written instruction about how sleep can affect your health.  Define sleep hygiene, discuss sleep cycles and impact of sleep habits. Review good sleep hygiene tips.   Initial Review & Psychosocial Screening:  Initial Psych Review & Screening - 04/23/24 0854       Initial Review   Current issues with Current Stress Concerns    Comments Concerned about her health and the impact/ burden on her family.      Family Dynamics   Good Support System? Yes    Comments She has good support from her son.      Barriers   Psychosocial barriers to participate in program The patient should benefit from training in stress management and relaxation.;Psychosocial barriers identified (see note)      Screening Interventions   Interventions Encouraged to exercise;To provide support and resources with identified psychosocial needs;Provide feedback about the scores to participant    Expected Outcomes Short Term goal: Utilizing psychosocial counselor, staff and physician to assist with identification of specific Stressors or current issues interfering with healing process. Setting desired goal for each stressor or current issue identified.;Long Term Goal: Stressors or current issues are controlled or eliminated.;Short Term goal: Identification and review with participant of any Quality of Life or Depression concerns found by scoring the questionnaire.;Long Term goal: The participant improves quality of Life and PHQ9 Scores as seen by post scores and/or verbalization of changes          Quality of Life Scores:   Quality of Life - 04/23/24 1535       Quality of Life   Select Quality of Life      Quality of Life  Scores   Health/Function Pre 24.86 %    Socioeconomic Pre 27 %    Psych/Spiritual Pre 30 %    Family Pre 30 %    GLOBAL Pre 27.1 %         Scores of 19 and below usually indicate a poorer quality of life in these areas.  A difference of  2-3 points is a clinically meaningful difference.  A difference of 2-3 points in the total score of the Quality of Life Index has been associated with significant improvement in overall quality of life, self-image, physical symptoms, and general health in studies assessing change in quality of life.  PHQ-9: Review Flowsheet  More data exists      06/11/2024 04/23/2024 01/16/2024 11/06/2023 10/12/2023  Depression screen PHQ 2/9  Decreased Interest 0 2 0 0 0  Down, Depressed, Hopeless 0 1 0 0 1  PHQ - 2 Score 0 3 0 0 1  Altered sleeping 0 1 0 -  1  Tired, decreased energy 0 2 0 - 3  Change in appetite 0 0 0 - 0  Feeling bad or failure about yourself  0 0 0 - 1  Trouble concentrating 0 0 0 - 1  Moving slowly or fidgety/restless 0 0 0 - 1  Suicidal thoughts 0 0 0 - 0  PHQ-9 Score 0 6 0 - 8  Difficult doing work/chores Not difficult at all Not difficult at all Not difficult at all - Somewhat difficult   Interpretation of Total Score  Total Score Depression Severity:  1-4 = Minimal depression, 5-9 = Mild depression, 10-14 = Moderate depression, 15-19 = Moderately severe depression, 20-27 = Severe depression   Psychosocial Evaluation and Intervention:   Psychosocial Re-Evaluation:  Psychosocial Re-Evaluation     Row Name 04/30/24 1153 05/31/24 0850 06/28/24 1323         Psychosocial Re-Evaluation   Current issues with Current Stress Concerns Current Stress Concerns Current Stress Concerns     Comments Christy Poole did not voice any increased concerns or stressors on her first day of exercise. Will continue to montior and offer supoprt as needed. Will continue to montior and offer supoprt as needed.     Expected Outcomes Christy Poole will have controlled or  decreased stressors upon completion of cardiac rehab Will continue to montior and offer supoprt as needed. Will continue to montior and offer supoprt as needed.     Interventions Stress management education;Relaxation education;Encouraged to attend Cardiac Rehabilitation for the exercise Stress management education;Relaxation education;Encouraged to attend Cardiac Rehabilitation for the exercise Stress management education;Relaxation education;Encouraged to attend Cardiac Rehabilitation for the exercise     Continue Psychosocial Services  Follow up required by staff Follow up required by staff Follow up required by staff       Initial Review   Source of Stress Concerns Chronic Illness Chronic Illness Chronic Illness     Comments will continue to monitor and offer support as needed Will continue to montior and offer supoprt as needed. Will continue to montior and offer supoprt as needed.        Psychosocial Discharge (Final Psychosocial Re-Evaluation):  Psychosocial Re-Evaluation - 06/28/24 1323       Psychosocial Re-Evaluation   Current issues with Current Stress Concerns    Comments Will continue to montior and offer supoprt as needed.    Expected Outcomes Will continue to montior and offer supoprt as needed.    Interventions Stress management education;Relaxation education;Encouraged to attend Cardiac Rehabilitation for the exercise    Continue Psychosocial Services  Follow up required by staff      Initial Review   Source of Stress Concerns Chronic Illness    Comments Will continue to montior and offer supoprt as needed.          Vocational Rehabilitation: Provide vocational rehab assistance to qualifying candidates.   Vocational Rehab Evaluation & Intervention:  Vocational Rehab - 04/23/24 0856       Initial Vocational Rehab Evaluation & Intervention   Assessment shows need for Vocational Rehabilitation No      Vocational Rehab Re-Evaulation   Comments Retired, no VR  needs.          Education: Education Goals: Education classes will be provided on a variety of topics geared toward better understanding of heart health and risk factor modification. Participant will state understanding/return demonstration of topics presented as noted by education test scores.  Learning Barriers/Preferences:  Learning Barriers/Preferences - 04/23/24 9143  Learning Barriers/Preferences   Learning Barriers Language;Sight    Learning Preferences Individual Instruction          General Cardiac Education Topics:  AED/CPR: - Group verbal and written instruction with the use of models to demonstrate the basic use of the AED with the basic ABC's of resuscitation.   Test and Procedures: - Group verbal and visual presentation and models provide information about basic cardiac anatomy and function. Reviews the testing methods done to diagnose heart disease and the outcomes of the test results. Describes the treatment choices: Medical Management, Angioplasty, or Coronary Bypass Surgery for treating various heart conditions including Myocardial Infarction, Angina, Valve Disease, and Cardiac Arrhythmias. Written material provided at class time.   Medication Safety: - Group verbal and visual instruction to review commonly prescribed medications for heart and lung disease. Reviews the medication, class of the drug, and side effects. Includes the steps to properly store meds and maintain the prescription regimen. Written material provided at class time.   Intimacy: - Group verbal instruction through game format to discuss how heart and lung disease can affect sexual intimacy. Written material provided at class time.   Know Your Numbers and Heart Failure: - Group verbal and visual instruction to discuss disease risk factors for cardiac and pulmonary disease and treatment options.  Reviews associated critical values for Overweight/Obesity, Hypertension, Cholesterol, and  Diabetes.  Discusses basics of heart failure: signs/symptoms and treatments.  Introduces Heart Failure Zone chart for action plan for heart failure. Written material provided at class time.   Infection Prevention: - Provides verbal and written material to individual with discussion of infection control including proper hand washing and proper equipment cleaning during exercise session.   Falls Prevention: - Provides verbal and written material to individual with discussion of falls prevention and safety.   Other: -Provides group and verbal instruction on various topics (see comments)   Knowledge Questionnaire Score:  Knowledge Questionnaire Score - 04/23/24 1546       Knowledge Questionnaire Score   Pre Score 6/24 completed with interpreter          Core Components/Risk Factors/Patient Goals at Admission:  Personal Goals and Risk Factors at Admission - 04/23/24 0857       Core Components/Risk Factors/Patient Goals on Admission   Diabetes Yes    Intervention Provide education about signs/symptoms and action to take for hypo/hyperglycemia.;Provide education about proper nutrition, including hydration, and aerobic/resistive exercise prescription along with prescribed medications to achieve blood glucose in normal ranges: Fasting glucose 65-99 mg/dL    Expected Outcomes Short Term: Participant verbalizes understanding of the signs/symptoms and immediate care of hyper/hypoglycemia, proper foot care and importance of medication, aerobic/resistive exercise and nutrition plan for blood glucose control.;Long Term: Attainment of HbA1C < 7%.    Hypertension Yes    Intervention Provide education on lifestyle modifcations including regular physical activity/exercise, weight management, moderate sodium restriction and increased consumption of fresh fruit, vegetables, and low fat dairy, alcohol moderation, and smoking cessation.;Monitor prescription use compliance.    Expected Outcomes Short  Term: Continued assessment and intervention until BP is < 140/73mm HG in hypertensive participants. < 130/64mm HG in hypertensive participants with diabetes, heart failure or chronic kidney disease.;Long Term: Maintenance of blood pressure at goal levels.    Lipids Yes    Intervention Provide education and support for participant on nutrition & aerobic/resistive exercise along with prescribed medications to achieve LDL 70mg , HDL >40mg .    Expected Outcomes Short Term: Participant states understanding of desired  cholesterol values and is compliant with medications prescribed. Participant is following exercise prescription and nutrition guidelines.;Long Term: Cholesterol controlled with medications as prescribed, with individualized exercise RX and with personalized nutrition plan. Value goals: LDL < 70mg , HDL > 40 mg.          Education:Diabetes - Individual verbal and written instruction to review signs/symptoms of diabetes, desired ranges of glucose level fasting, after meals and with exercise. Acknowledge that pre and post exercise glucose checks will be done for 3 sessions at entry of program.   Core Components/Risk Factors/Patient Goals Review:   Goals and Risk Factor Review     Row Name 04/30/24 1156 05/31/24 0854 06/28/24 1323         Core Components/Risk Factors/Patient Goals Review   Personal Goals Review Weight Management/Obesity;Lipids;Hypertension;Diabetes;Stress Diabetes;Hypertension;Lipids Diabetes;Hypertension;Lipids     Review Christy Poole completed exercise on 04/29/24. Vital signs were stable. CBG 78 post c/o feeling lightheaded juice given symptoms resolved. Christy Poole completed exercise on 04/29/24. Vital signs have been stable.  Has gained approximately 1kg over the last 30 days. Christy Poole is doing well with exercise at cardiac rehab. Vital signs have been stable.  Has gained approximately 1.3kg over the last 30 days (2.3kg since beginning of CR).     Expected Outcomes Christy Poole will  continue to participate in cardiac rehab for exercise, nutrition and lifestyle modifications Christy Poole will continue to participate in cardiac rehab for exercise, nutrition and lifestyle modifications Christy Poole will continue to participate in cardiac rehab for exercise, nutrition and lifestyle modifications        Core Components/Risk Factors/Patient Goals at Discharge (Final Review):   Goals and Risk Factor Review - 06/28/24 1323       Core Components/Risk Factors/Patient Goals Review   Personal Goals Review Diabetes;Hypertension;Lipids    Review Christy Poole is doing well with exercise at cardiac rehab. Vital signs have been stable.  Has gained approximately 1.3kg over the last 30 days (2.3kg since beginning of CR).    Expected Outcomes Christy Poole will continue to participate in cardiac rehab for exercise, nutrition and lifestyle modifications          ITP Comments:  ITP Comments     Row Name 04/23/24 0801 04/30/24 1149 05/31/24 0843 06/28/24 1323     ITP Comments Medical Director- Dr. Wilbert Bihari, MD. Introduction to the [facility] / Intensive Cardiac Rehab Program. Review of initial orientation folder. 30 Day ITP Review. Christy Poole started cardiac rehab on 04/29/24. Christy Poole did fair with exercise. Christy Poole did report feeling lightheaded after getting of the nustep. Given juiice for a CBG of 78 post exercise. Christy Poole was able to complete her session without further complaints. 30 Day ITP Review. Christy Poole started cardiac rehab on 04/29/24.  Pt has good attendance with cardiac rehab 30 Day ITP Review. Christy Poole started cardiac rehab on 04/29/24.  Pt has continued to have good attendance with cardiac rehab       Comments: see ITP comments

## 2024-07-03 ENCOUNTER — Encounter (HOSPITAL_COMMUNITY)
Admission: RE | Admit: 2024-07-03 | Discharge: 2024-07-03 | Disposition: A | Discharge status: Home / Self Care | Source: Ambulatory Visit | Attending: Cardiology | Admitting: Cardiology

## 2024-07-03 DIAGNOSIS — Z952 Presence of prosthetic heart valve: Secondary | ICD-10-CM

## 2024-07-05 ENCOUNTER — Encounter (HOSPITAL_COMMUNITY)
Admission: RE | Admit: 2024-07-05 | Discharge: 2024-07-05 | Disposition: A | Discharge status: Home / Self Care | Source: Ambulatory Visit | Attending: Cardiology

## 2024-07-05 DIAGNOSIS — Z952 Presence of prosthetic heart valve: Secondary | ICD-10-CM

## 2024-07-08 ENCOUNTER — Encounter (HOSPITAL_COMMUNITY)
Admission: RE | Admit: 2024-07-08 | Discharge: 2024-07-08 | Disposition: A | Discharge status: Home / Self Care | Source: Ambulatory Visit | Attending: Cardiology

## 2024-07-08 DIAGNOSIS — Z952 Presence of prosthetic heart valve: Secondary | ICD-10-CM

## 2024-07-09 LAB — HM DIABETES EYE EXAM

## 2024-07-10 ENCOUNTER — Encounter (HOSPITAL_COMMUNITY)
Admission: RE | Admit: 2024-07-10 | Discharge: 2024-07-10 | Disposition: A | Discharge status: Home / Self Care | Source: Ambulatory Visit | Attending: Cardiology | Admitting: Cardiology

## 2024-07-10 VITALS — Ht 58.25 in | Wt 104.1 lb

## 2024-07-10 DIAGNOSIS — Z952 Presence of prosthetic heart valve: Secondary | ICD-10-CM

## 2024-07-12 ENCOUNTER — Encounter (HOSPITAL_COMMUNITY)
Admission: RE | Admit: 2024-07-12 | Discharge: 2024-07-12 | Disposition: A | Discharge status: Home / Self Care | Source: Ambulatory Visit | Attending: Cardiology | Admitting: Cardiology

## 2024-07-12 ENCOUNTER — Ambulatory Visit

## 2024-07-12 DIAGNOSIS — Z952 Presence of prosthetic heart valve: Secondary | ICD-10-CM

## 2024-07-12 DIAGNOSIS — Z7901 Long term (current) use of anticoagulants: Secondary | ICD-10-CM | POA: Insufficient documentation

## 2024-07-12 DIAGNOSIS — Z8673 Personal history of transient ischemic attack (TIA), and cerebral infarction without residual deficits: Secondary | ICD-10-CM | POA: Insufficient documentation

## 2024-07-12 DIAGNOSIS — I4821 Permanent atrial fibrillation: Secondary | ICD-10-CM | POA: Insufficient documentation

## 2024-07-12 LAB — POCT INR: INR: 2.8 (ref 2.0–3.0)

## 2024-07-12 NOTE — Patient Instructions (Signed)
 resume taking warfarin 2 tablets daily, except 3 tablets on Tuesdays and Thursdays.  Eat 3 helpings of greens per week Recheck INR in 4 week. Coumadin  Clinic [phone]

## 2024-07-12 NOTE — Progress Notes (Signed)
 INR 2.8 Please see anticoagulation encounter resume taking warfarin 2 tablets daily, except 3 tablets on Tuesdays and Thursdays.  Eat 3 helpings of greens per week Recheck INR in 4 week. Coumadin  Clinic [phone]

## 2024-07-15 ENCOUNTER — Encounter (HOSPITAL_COMMUNITY)
Admission: RE | Admit: 2024-07-15 | Discharge: 2024-07-15 | Disposition: A | Discharge status: Home / Self Care | Source: Ambulatory Visit | Attending: Cardiology | Admitting: Cardiology

## 2024-07-15 DIAGNOSIS — Z952 Presence of prosthetic heart valve: Secondary | ICD-10-CM | POA: Diagnosis not present

## 2024-07-17 ENCOUNTER — Encounter (HOSPITAL_COMMUNITY)
Admission: RE | Admit: 2024-07-17 | Discharge: 2024-07-17 | Disposition: A | Discharge status: Home / Self Care | Source: Ambulatory Visit | Attending: Cardiology | Admitting: Cardiology

## 2024-07-17 DIAGNOSIS — Z952 Presence of prosthetic heart valve: Secondary | ICD-10-CM | POA: Diagnosis not present

## 2024-07-18 NOTE — Progress Notes (Signed)
 Discharge Progress Report  Patient Details  Name: Christy Poole MRN: [id number] Date of Birth: Mar 27, 1961 Referring Provider:   Flowsheet Row INTENSIVE CARDIAC REHAB ORIENT from 04/25/2024 in [facility]  Referring Provider Shelda Bruckner, MD     Number of Visits: 61  Reason for Discharge:  Patient reached a stable level of exercise. Patient independent in their exercise. Patient has met program and personal goals.  Smoking History:  Social History   Tobacco Use  Smoking Status Never   Passive exposure: Never  Smokeless Tobacco Never    Diagnosis:  11/28/23 MVR (mitral valve replacement)  ADL UCSD:   Initial Exercise Prescription:  Initial Exercise Prescription - 04/25/24 0900       Date of Initial Exercise RX and Referring Provider   Date 04/25/24    Referring Provider Shelda Bruckner, MD    Expected Discharge Date 07/17/24      NuStep   Level 1    SPM 70    Minutes 30    METs 2      Prescription Details   Frequency (times per week) 3    Duration Progress to 30 minutes of continuous aerobic without signs/symptoms of physical distress      Intensity   THRR 40-80% of Max Heartrate 62-124    Ratings of Perceived Exertion 11-13    Perceived Dyspnea 0-4      Progression   Progression Continue progressive overload as per policy without signs/symptoms or physical distress.      Resistance Training   Training Prescription Yes    Weight 1    Reps 10-15          Discharge Exercise Prescription (Final Exercise Prescription Changes):  Exercise Prescription Changes - 07/17/24 1223       Response to Exercise   Blood Pressure (Admit) 138/66    Blood Pressure (Exit) 138/78    Heart Rate (Admit) 82 bpm    Heart Rate (Exercise) 101 bpm    Heart Rate (Exit) 85 bpm    Rating of Perceived Exertion (Exercise) 8    Perceived Dyspnea (Exercise) 0    Symptoms none    Comments Pt graduated  the [employer] program    Duration Progress to 30 minutes of  aerobic without signs/symptoms of physical distress    Intensity THRR unchanged      Progression   Progression Continue to progress workloads to maintain intensity without signs/symptoms of physical distress.    Average METs 1.8      Resistance Training   Training Prescription No    Weight 2    Reps 10-15    Time 10 Minutes      T5 Nustep   Level 3    SPM 70    Minutes 15    METs 1.8   Used T5 because her feet fit better and had more grip, MET's lower because it was harder     Home Exercise Plan   Plans to continue exercise at Home (comment)    Frequency Add 2 additional days to program exercise sessions.    Initial Home Exercises Provided 05/15/24          Functional Capacity:  6 Minute Walk     Row Name 04/25/24 0932 07/10/24 0835       6 Minute Walk   Phase Initial Discharge    Distance 1455 feet 1250 feet    Distance % Change -- -14.09 %  Distance Feet Change -- -205 ft    Walk Time 6 minutes 6 minutes    # of Rest Breaks 0 0    MPH 2.76 2.37    METS 3.63 3.47    RPE 7 14    Perceived Dyspnea  1 1    VO2 Peak 12.71 12.16    Symptoms Yes (comment) No    Comments SOB- resolved with rest. No pain. --    Resting HR 87 bpm 89 bpm    Resting BP 106/60 138/70    Resting Oxygen Saturation  100 % --    Exercise Oxygen Saturation  during 6 min walk 96 % 96 %    Max Ex. HR 114 bpm 129 bpm    Max Ex. BP 124/66 134/74    2 Minute Post BP 114/68 --       Psychological, QOL, Others - Outcomes: PHQ 2/9:    06/11/2024    8:14 AM 04/23/2024   10:22 AM 01/16/2024   10:56 AM 11/06/2023    8:21 AM 10/12/2023    9:29 AM  Depression screen PHQ 2/9  Decreased Interest 0 2 0 0 0  Down, Depressed, Hopeless 0 1 0 0 1  PHQ - 2 Score 0 3 0 0 1  Altered sleeping 0 1 0  1  Tired, decreased energy 0 2 0  3  Change in appetite 0 0 0  0  Feeling bad or failure about yourself  0 0 0  1  Trouble concentrating 0 0  0  1  Moving slowly or fidgety/restless 0 0 0  1  Suicidal thoughts 0 0 0  0  PHQ-9 Score 0 6 0  8  Difficult doing work/chores Not difficult at all Not difficult at all Not difficult at all  Somewhat difficult    Quality of Life:  Quality of Life - 04/23/24 1535       Quality of Life   Select Quality of Life      Quality of Life Scores   Health/Function Pre 24.86 %    Socioeconomic Pre 27 %    Psych/Spiritual Pre 30 %    Family Pre 30 %    GLOBAL Pre 27.1 %          Personal Goals: Goals established at orientation with interventions provided to work toward goal.  Personal Goals and Risk Factors at Admission - 04/23/24 0857       Core Components/Risk Factors/Patient Goals on Admission   Diabetes Yes    Intervention Provide education about signs/symptoms and action to take for hypo/hyperglycemia.;Provide education about proper nutrition, including hydration, and aerobic/resistive exercise prescription along with prescribed medications to achieve blood glucose in normal ranges: Fasting glucose 65-99 mg/dL    Expected Outcomes Short Term: Participant verbalizes understanding of the signs/symptoms and immediate care of hyper/hypoglycemia, proper foot care and importance of medication, aerobic/resistive exercise and nutrition plan for blood glucose control.;Long Term: Attainment of HbA1C < 7%.    Hypertension Yes    Intervention Provide education on lifestyle modifcations including regular physical activity/exercise, weight management, moderate sodium restriction and increased consumption of fresh fruit, vegetables, and low fat dairy, alcohol moderation, and smoking cessation.;Monitor prescription use compliance.    Expected Outcomes Short Term: Continued assessment and intervention until BP is < 140/65mm HG in hypertensive participants. < 130/23mm HG in hypertensive participants with diabetes, heart failure or chronic kidney disease.;Long Term: Maintenance of blood pressure at goal  levels.    Lipids  Yes    Intervention Provide education and support for participant on nutrition & aerobic/resistive exercise along with prescribed medications to achieve LDL 70mg , HDL >40mg .    Expected Outcomes Short Term: Participant states understanding of desired cholesterol values and is compliant with medications prescribed. Participant is following exercise prescription and nutrition guidelines.;Long Term: Cholesterol controlled with medications as prescribed, with individualized exercise RX and with personalized nutrition plan. Value goals: LDL < 70mg , HDL > 40 mg.           Personal Goals Discharge:  Goals and Risk Factor Review     Row Name 04/30/24 1156 05/31/24 0854 06/28/24 1323         Core Components/Risk Factors/Patient Goals Review   Personal Goals Review Weight Management/Obesity;Lipids;Hypertension;Diabetes;Stress Diabetes;Hypertension;Lipids Diabetes;Hypertension;Lipids     Review Suriah completed exercise on 04/29/24. Vital signs were stable. CBG 78 post c/o feeling lightheaded juice given symptoms resolved. Tegan completed exercise on 04/29/24. Vital signs have been stable.  Has gained approximately 1kg over the last 30 days. Shavon is doing well with exercise at cardiac rehab. Vital signs have been stable.  Has gained approximately 1.3kg over the last 30 days (2.3kg since beginning of CR).     Expected Outcomes Tyger will continue to participate in cardiac rehab for exercise, nutrition and lifestyle modifications Yariela will continue to participate in cardiac rehab for exercise, nutrition and lifestyle modifications Prentiss will continue to participate in cardiac rehab for exercise, nutrition and lifestyle modifications        Exercise Goals and Review:  Exercise Goals     Row Name 04/23/24 0831             Exercise Goals   Increase Physical Activity Yes       Intervention Provide advice, education, support and counseling about physical  activity/exercise needs.;Develop an individualized exercise prescription for aerobic and resistive training based on initial evaluation findings, risk stratification, comorbidities and participant's personal goals.       Expected Outcomes Short Term: Attend rehab on a regular basis to increase amount of physical activity.;Long Term: Exercising regularly at least 3-5 days a week.;Long Term: Add in home exercise to make exercise part of routine and to increase amount of physical activity.       Increase Strength and Stamina Yes       Intervention Provide advice, education, support and counseling about physical activity/exercise needs.;Develop an individualized exercise prescription for aerobic and resistive training based on initial evaluation findings, risk stratification, comorbidities and participant's personal goals.       Expected Outcomes Short Term: Increase workloads from initial exercise prescription for resistance, speed, and METs.;Short Term: Perform resistance training exercises routinely during rehab and add in resistance training at home;Long Term: Improve cardiorespiratory fitness, muscular endurance and strength as measured by increased METs and functional capacity ( )       Able to understand and use rate of perceived exertion (RPE) scale Yes       Intervention Provide education and explanation on how to use RPE scale       Expected Outcomes Long Term:  Able to use RPE to guide intensity level when exercising independently;Short Term: Able to use RPE daily in rehab to express subjective intensity level       Knowledge and understanding of Target Heart Rate Range (THRR) Yes       Intervention Provide education and explanation of THRR including how the numbers were predicted and where they are located for reference  Expected Outcomes Short Term: Able to state/look up THRR;Long Term: Able to use THRR to govern intensity when exercising independently;Short Term: Able to use daily as  guideline for intensity in rehab       Able to check pulse independently Yes       Intervention Provide education and demonstration on how to check pulse in carotid and radial arteries.;Review the importance of being able to check your own pulse for safety during independent exercise       Expected Outcomes Short Term: Able to explain why pulse checking is important during independent exercise;Long Term: Able to check pulse independently and accurately       Understanding of Exercise Prescription Yes       Intervention Provide education, explanation, and written materials on patient's individual exercise prescription       Expected Outcomes Short Term: Able to explain program exercise prescription;Long Term: Able to explain home exercise prescription to exercise independently          Exercise Goals Re-Evaluation:  Exercise Goals Re-Evaluation     Row Name 04/29/24 0823 05/15/24 0837 06/10/24 0959 07/08/24 0845 07/17/24 1419     Exercise Goal Re-Evaluation   Exercise Goals Review Increase Physical Activity;Understanding of Exercise Prescription;Increase Strength and Stamina;Knowledge and understanding of Target Heart Rate Range (THRR);Able to understand and use rate of perceived exertion (RPE) scale Increase Physical Activity;Understanding of Exercise Prescription;Increase Strength and Stamina;Knowledge and understanding of Target Heart Rate Range (THRR);Able to understand and use rate of perceived exertion (RPE) scale Increase Physical Activity;Understanding of Exercise Prescription;Increase Strength and Stamina;Knowledge and understanding of Target Heart Rate Range (THRR);Able to understand and use rate of perceived exertion (RPE) scale Increase Physical Activity;Understanding of Exercise Prescription;Increase Strength and Stamina;Knowledge and understanding of Target Heart Rate Range (THRR);Able to understand and use rate of perceived exertion (RPE) scale Increase Physical Activity;Understanding  of Exercise Prescription;Increase Strength and Stamina;Knowledge and understanding of Target Heart Rate Range (THRR);Able to understand and use rate of perceived exertion (RPE) scale   Comments Pt first day in the [facility] program. Pt tolerated exercise fair with an agverage MET level of 1.7. Pt felt light headed post Nustep, glucose lower than start at 78, gave OJ. No weights or cool down today. Reviewed MET's, goals and home ExRx. Pt tolerated exercise fair with an agverage MET level of 2.2. She is still having issues with increased fatigue and SOB. Steadly working towards 30 mins of exercise on the nustep. 23 mins today. She is going to add in some exercise on her own by walking 10 mins at a time until she gets fatigued and then work towards the goal of getting three 10 mins sessions so she gets a total of 30 mins 1-2 days a week. Reviewed MET's and goals. Pt tolerated exercise fair with an agverage MET level of 2.9. She continues with increased fatigue and SOB, but is mildly better. She is able to achieve 30 mins almost every day now. Small improvements but overall doing well Reviewed MET's and goals. Pt tolerated exercise good with an average MET level of 3.4. She tried some other pieces of equipment today, but Nustep is continuing to be most helpful to her. Overall feeling an increase in overall health Reviewed MET's and goals. Pt tolerated exercise good with an average MET level of 3.4 (1.8 today, but tried a T5 nustep to hold her foot in better, but MET's were lower because it was harder). She did well in the program and increased overall  in her MET's. Walk test did not show her progress, but she feels very good and has decreased her symptoms such as SOB  in her ADL's. She will continue her exercise by walking 6-7 days for 20-40 mins per session   Expected Outcomes Will continue to monitor pt Will continue to monitor pt Will continue to monitor pt Will continue to monitor pt Pt will continue to  exercise on her own and gain strength.      Nutrition & Weight - Outcomes:  Pre Biometrics - 04/25/24 0930       Pre Biometrics   Waist Circumference 33 inches    Hip Circumference 36.25 inches    Waist to Hip Ratio 0.91 %    Triceps Skinfold 18 mm    % Body Fat 33 %    Grip Strength 4 kg    Flexibility 18 in    Single Leg Stand 3.63 seconds          Post Biometrics - 07/10/24 0842        Post  Biometrics   Height 4' 10.25 (1.48 m)    Weight 47.2 kg    Waist Circumference 32 inches    Hip Circumference 37 inches    Waist to Hip Ratio 0.86 %    BMI (Calculated) 21.55    Triceps Skinfold 15 mm    % Body Fat 32.1 %    Grip Strength 5 kg    Flexibility 17.25 in    Single Leg Stand 2 seconds          Nutrition:  Nutrition Therapy & Goals - 05/29/24 0842       Nutrition Therapy   Diet Heart Healthy Diet    Drug/Food Interactions Statins/Certain Fruits;Coumadin /Vit K      Personal Nutrition Goals   Nutrition Goal Patient to improve diet quality by using the plate method as a guide for meal planning to include lean protein/plant protein, fruits, vegetables, whole grains, nonfat dairy as part of a well-balanced diet.   goal in progress.   Comments Goals in progress. Patient has medical history of s/p MVR 11/28/23, TIA/CVA, afib, DM2. A1c is well controlled in a pre-diabetic range. Most recent lipid panel from 2023 WNL, LDL at goal. She continues regular follow-up with coumadin  clinic. She is up 2.4# since starting with our program. Patient will benefit from participation in intensive cardiac rehab for nutrition education, exercise, and lifestyle modification.      Intervention Plan   Intervention Prescribe, educate and counsel regarding individualized specific dietary modifications aiming towards targeted core components such as weight, hypertension, lipid management, diabetes, heart failure and other comorbidities.;Nutrition handout(s) given to patient.    Expected  Outcomes Short Term Goal: Understand basic principles of dietary content, such as calories, fat, sodium, cholesterol and nutrients.;Long Term Goal: Adherence to prescribed nutrition plan.          Nutrition Discharge:   Education Questionnaire Score:  Knowledge Questionnaire Score - 04/23/24 1546       Knowledge Questionnaire Score   Pre Score 6/24 completed with interpreter         Pt graduated from cardiac rehab program 07/17/2024 with completion of 34 sessions. Pt maintained good attendance and made progress demonstrated through increased MET levels during participation, decrease SOB and demonstrated more confidence a couple weeks into program participation. Aaima is commended for her improvements and reaching goals.  Pt plans to continue a home walking program.  Dr. Wilbert Bihari is the Medical Director  for Cardiac Rehabilitation

## 2024-07-19 ENCOUNTER — Other Ambulatory Visit (HOSPITAL_BASED_OUTPATIENT_CLINIC_OR_DEPARTMENT_OTHER): Payer: Self-pay | Admitting: Family Medicine

## 2024-07-19 ENCOUNTER — Encounter (HOSPITAL_COMMUNITY)

## 2024-07-22 ENCOUNTER — Other Ambulatory Visit: Payer: Self-pay

## 2024-07-22 MED ORDER — ATORVASTATIN CALCIUM 80 MG PO TABS: 80.0000 mg | ORAL_TABLET | Freq: Every day | ORAL | 3 refills | Status: AC

## 2024-08-09 ENCOUNTER — Ambulatory Visit: Attending: Cardiovascular Disease

## 2024-08-09 DIAGNOSIS — I4821 Permanent atrial fibrillation: Secondary | ICD-10-CM | POA: Insufficient documentation

## 2024-08-09 DIAGNOSIS — Z7901 Long term (current) use of anticoagulants: Secondary | ICD-10-CM | POA: Diagnosis present

## 2024-08-09 DIAGNOSIS — Z8673 Personal history of transient ischemic attack (TIA), and cerebral infarction without residual deficits: Secondary | ICD-10-CM | POA: Diagnosis present

## 2024-08-09 LAB — POCT INR: INR: 3.4 — AB (ref 2.0–3.0)

## 2024-08-09 NOTE — Patient Instructions (Signed)
 resume taking warfarin 2 tablets daily, except 3 tablets on Tuesdays and Thursdays.  Eat 3 helpings of greens per week Recheck INR in 7 weeks. Coumadin  Clinic [phone]

## 2024-08-09 NOTE — Progress Notes (Signed)
 INR 3.4 Please see anticoagulation encounter resume taking warfarin 2 tablets daily, except 3 tablets on Tuesdays and Thursdays.  Eat 3 helpings of greens per week Recheck INR in 7 weeks. Coumadin  Clinic [phone]

## 2024-08-23 ENCOUNTER — Ambulatory Visit (HOSPITAL_BASED_OUTPATIENT_CLINIC_OR_DEPARTMENT_OTHER)
Admission: RE | Admit: 2024-08-23 | Discharge: 2024-08-23 | Disposition: A | Discharge status: Home / Self Care | Source: Ambulatory Visit | Attending: Pulmonary Disease | Admitting: Pulmonary Disease

## 2024-08-23 DIAGNOSIS — R590 Localized enlarged lymph nodes: Secondary | ICD-10-CM | POA: Insufficient documentation

## 2024-08-29 ENCOUNTER — Ambulatory Visit: Payer: Self-pay | Admitting: Pulmonary Disease

## 2024-09-17 ENCOUNTER — Ambulatory Visit (HOSPITAL_BASED_OUTPATIENT_CLINIC_OR_DEPARTMENT_OTHER): Admitting: Pulmonary Disease

## 2024-09-17 ENCOUNTER — Encounter (HOSPITAL_BASED_OUTPATIENT_CLINIC_OR_DEPARTMENT_OTHER): Payer: Self-pay | Admitting: Pulmonary Disease

## 2024-09-17 VITALS — BP 138/84 | HR 92 | Temp 98.1°F | Ht <= 58 in | Wt 104.5 lb

## 2024-09-17 DIAGNOSIS — R590 Localized enlarged lymph nodes: Secondary | ICD-10-CM | POA: Diagnosis not present

## 2024-09-17 DIAGNOSIS — Z8673 Personal history of transient ischemic attack (TIA), and cerebral infarction without residual deficits: Secondary | ICD-10-CM

## 2024-09-17 DIAGNOSIS — R918 Other nonspecific abnormal finding of lung field: Secondary | ICD-10-CM | POA: Diagnosis not present

## 2024-09-17 DIAGNOSIS — J42 Unspecified chronic bronchitis: Secondary | ICD-10-CM | POA: Diagnosis not present

## 2024-09-17 DIAGNOSIS — R0602 Shortness of breath: Secondary | ICD-10-CM | POA: Diagnosis not present

## 2024-09-17 NOTE — Patient Instructions (Signed)
 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 No hypermetabolism on prior PET Discussed surveillance for additional year and if stable no further imaging indicated Multiple lung nodules --Reviewed CT Chest 08/28/24. Overall stable with new subcentimeter nodules in lingula and right lower lobe --ORDER CT Chest without contrast   Chronic bronchitis  Shortness of breath with exertion --Use inhaler (either Albuterol  or Dulera ) 2 puffs as needed.  --Dulera  is limited to twice a day total due to formula --OK to use albuterol  every 4 hours as needed --Encouraged upper body exercises --Referred to pulmonary rehab

## 2024-09-17 NOTE — Progress Notes (Signed)
 Subjective:   PATIENT ID: Christy Poole GENDER: female DOB: Nov 30, 1960, MRN: [id number]  Chief Complaint  Patient presents with   Bronchitis   Reason for Visit: Cough  [name] is a 63 year old never smoker with stroke, seizures, DM2 and atrial fibrillation who presents for follow-up for cough  Initial consult She reports shortness of breath since 2018 after her stroke. In the last week she has noticed worsening shortness of breath, chest tightness and wheezing. Associated with unproductive cough sometimes. Does not uses inhalers. Sometimes has chest tightness when laying down in the last year. Denies personal history of asthma. Mother had breathing issues. Denies weight change (5lbs over 1 year), night sweats or unexplained fevers/chills.  12/22/22 Son present to provide additional history. Patient completed PFTs and discussed results on phone however has not started Dulera  since last visit. Continues to have unchanged symptoms of shortness of breath, cough, chest tightness and wheezing.  03/16/23 Son present and translates with Gujarati. No translator present. Since our last visit she has had a seizure a month ago. She is using her inhaler but having difficulty coordinating breaths. She is productive coughing and wheezing. Also had recent URI caught by her granddaughter. Overall improved from acute illness but still has persistent cough.  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 Denies shortness of breath, cough or wheezing. Not taking Dulera .  09/17/24 Since our last visit she had mitral valve replaced in Feb and completed cardiac rehab in September. She continues to walk at least 3 days a week and some upper body exercises. She reports shortness of breath with moderate or intense activity. She does have to stop doing certain activities including stopping while doing dishes. She is  overall improved compared to before. Uses albuterol  inhaler with relief and uses once a week when her son can help.  Social History: Never smoker   Environmental exposures: Denies  Past Medical History:  Diagnosis Date   Atrial fibrillation (HCC)    Coronary artery disease    s/p LAD stents   Diabetes mellitus without complication (HCC)    Dyspnea    Headache    HLD (hyperlipidemia)    Hypertension    Memory loss    due to stroke in 2024   Neuromuscular disorder (HCC)    feet   Pneumonia    x 1   Seizure (HCC)    last one was in 09/2023   Stroke [facility]) 2019   Thyroid disease    Walker as ambulation aid     Allergies  Allergen Reactions   Other Other (See Comments)    Patient is vegetarian no meat can eat eggs     Outpatient Medications Prior to Visit  Medication Sig Dispense Refill   acetaminophen  (TYLENOL ) 500 MG tablet Take 500 mg by mouth every 6 (six) hours as needed for mild pain (pain score 1-3), headache or fever.     albuterol  (VENTOLIN  HFA) 108 (90 Base) MCG/ACT inhaler Inhale 1-2 puffs into the lungs every 4 (four) hours as needed for wheezing or shortness of breath. 1 each 2   ASPIRIN  LOW DOSE 81 MG tablet TAKE 1 TABLET (81 MG TOTAL) BY MOUTH DAILY. (SWALLOW WHOLE) 90 tablet 3   atorvastatin  (LIPITOR ) 80 MG tablet Take 1 tablet (80 mg total) by mouth daily. 90 tablet 3   cetaphil (CETAPHIL) lotion Apply 1 Application topically  daily as needed for dry skin.     gabapentin  (NEURONTIN ) 100 MG capsule Take 1 capsule (100 mg total) by mouth at bedtime. 60 capsule 1   levETIRAcetam  (KEPPRA ) 250 MG tablet Take 1 tablet (250 mg total) by mouth 2 (two) times daily. 180 tablet 3   levothyroxine  (SYNTHROID ) 50 MCG tablet TAKE 1 TABLET (50 MCG TOTAL) BY MOUTH AT BEDTIME. 90 tablet 0   metFORMIN  (GLUCOPHAGE -XR) 500 MG 24 hr tablet TAKE 1 TABLET (500 MG TOTAL) BY MOUTH DAILY WITH BREAKFAST. 90 tablet 1   metoprolol  succinate (TOPROL -XL) 25 MG 24 hr tablet Take 1 tablet (25  mg total) by mouth daily. Take with or immediately following a meal. 90 tablet 3   mometasone -formoterol  (DULERA ) 100-5 MCG/ACT AERO Inhale 2 puffs into the lungs 2 (two) times daily as needed for wheezing or shortness of breath.     warfarin (COUMADIN ) 2.5 MG tablet TAKE 2 TO 3 TABLETS DAILY OR AS PRESCRIBED BY COUMADIN  CLINIC 70 tablet 2   No facility-administered medications prior to visit.    Review of Systems  Constitutional:  Negative for chills, diaphoresis, fever, malaise/fatigue and weight loss.  HENT:  Negative for congestion.   Respiratory:  Negative for cough, hemoptysis, sputum production, shortness of breath and wheezing.   Cardiovascular:  Negative for chest pain, palpitations and leg swelling.     Objective:   Vitals:   09/17/24 0854  BP: 138/84  Pulse: 92  Temp: 98.1 F (36.7 C)  SpO2: 98%  Weight: 104 lb 8 oz (47.4 kg)  Height: 4' 10 (1.473 m)    SpO2: 98 %  Physical Exam: General: Well-appearing, no acute distress HENT: Christy Poole, AT Eyes: EOMI, no scleral icterus Respiratory: Clear to auscultation bilaterally.  No crackles, wheezing or rales Cardiovascular: RRR, -M/R/G, no JVD Extremities:-Edema,-tenderness Neuro: AAO x4, CNII-XII grossly intact Psych: Normal mood, normal affect   Data Reviewed:  Imaging: CT Chest 09/21/22 - 1.3 x 11 rounded lesion in upper AP window. Unchanged compared to 12/09/21. Mosaic attenuation of lungs PET 12/09/22 - 1.3 cm AP window lesion with no hypermetabolism. Borderline mediastinal and left hilar lymph nodes with low level hypermetabolism CT Chest 06/16/23 - Stable 1.5 node in AP window CT Chest 09/09/23 - Stable AP lymph node. Multifocal pneumonia. Mosaic attenuation. No septal thickening, subpleural reticulation, traction bronchiectasis or honeycombing present. CT Chest 08/28/24 - Stable precarinal and prevascular lymph nodes. Overall stable multiple lung nodules with new 4 mm and 3 mm ground glass nodules in  RLL  PFT: 11/18/22 FVC 0.94 (38%) FEV1 0.84 (44%) Ratio 83  Interpretation: Reduced FVC and FEV1. Normal ratio. Will need lung volume testing to confirm restrictive defect  Labs: CBC    Component Value Date/Time   WBC 6.9 01/16/2024 1318   WBC 11.9 (H) 12/06/2023 0329   RBC 4.08 01/16/2024 1318   RBC 3.31 (L) 12/06/2023 0329   HGB 11.5 01/16/2024 1318   HCT 37.0 01/16/2024 1318   PLT 236 01/16/2024 1318   MCV 91 01/16/2024 1318   MCH 28.2 01/16/2024 1318   MCH 29.9 12/06/2023 0329   MCHC 31.1 (L) 01/16/2024 1318   MCHC 33.7 12/06/2023 0329   RDW 12.4 01/16/2024 1318   LYMPHSABS 1.4 01/16/2024 1318   MONOABS 0.4 12/15/2021 0749   EOSABS 0.2 01/16/2024 1318   BASOSABS 0.1 01/16/2024 1318        Assessment & Plan:   Discussion: 63 year old never smoker with stroke, seizures, DM2 and atrial fibrillation who presents  for follow-up for shortness of breath and enlarged mediastinal lymph node. Shortness of breath occurs with exertion and limits some daily activities. Suspect deconditioning and recommended upper body exercises and pulmonary rehab.  Reviewed 07/2024 CT with stable lymph nodes. PET/CT with no avidity in concerned lesion with mild activity in mediastinal and left hilar lymph node. Recommend serial surveillance for stability, low suspicion for malignancy at this time that would warrant invasive diagnostic evaluation. CT also demonstrates new subcentimeter nodules recommending follow-up in 1 year   Mediastinal lymphadenopathy - stable AP lymph nodes, likely benign 13 mm hypervascular AP window lesion. No hypermetabolism on prior PET Discussed surveillance for additional year and if stable no further imaging indicated Multiple lung nodules --Reviewed CT Chest 08/28/24. Overall stable with new subcentimeter nodules in lingula and right lower lobe --ORDER CT Chest without contrast   Chronic bronchitis  Shortness of breath with exertion --Use inhaler (either Albuterol  or  Dulera ) 2 puffs as needed.  --Dulera  is limited to twice a day total due to formula --OK to use albuterol  every 4 hours as needed --Encouraged upper body exercises --Referred to pulmonary rehab  Health Maintenance Immunization History  Administered Date(s) Administered   Influenza, Seasonal, Injecte, Preservative Fre 09/13/2023   Influenza,inj,Quad PF,6+ Mos 11/10/2022   PNEUMOCOCCAL CONJUGATE-20 09/13/2023   CT Lung Screen - never smoker. Not qualified  Orders Placed This Encounter  Procedures   CT Chest Wo Contrast    Standing Status:   Future    Expiration Date:   09/17/2025    Scheduling Instructions:     Schedule in 08/2025    Preferred imaging location?:   [facility]   AMB referral to pulmonary rehabilitation    Referral Priority:   Routine    Referral Type:   Consultation    Number of Visits Requested:   1   No orders of the defined types were placed in this encounter.   Return in about 6 months (around 03/17/2025).  I have spent a total time of 35-minutes on the day of the appointment including chart review, data review, collecting history, coordinating care and discussing medical diagnosis and plan with the patient/family. Past medical history, allergies, medications were reviewed. Pertinent imaging, labs and tests included in this note have been reviewed and interpreted independently by me.  Jadamarie Butson Slater Staff, MD [facility] 09/17/2024 9:30 AM

## 2024-09-23 ENCOUNTER — Other Ambulatory Visit (HOSPITAL_BASED_OUTPATIENT_CLINIC_OR_DEPARTMENT_OTHER): Payer: Self-pay | Admitting: Family Medicine

## 2024-09-23 DIAGNOSIS — E119 Type 2 diabetes mellitus without complications: Secondary | ICD-10-CM

## 2024-09-24 ENCOUNTER — Encounter (HOSPITAL_COMMUNITY): Payer: Self-pay

## 2024-09-24 ENCOUNTER — Telehealth (HOSPITAL_COMMUNITY): Payer: Self-pay

## 2024-09-24 NOTE — Telephone Encounter (Signed)
 Call Pt via Interpreter services [phone].  Patient was not available and was told to call back later. Since my chart was utilized 09/17/24, sent letter via MyChart

## 2024-09-27 ENCOUNTER — Ambulatory Visit: Attending: Cardiovascular Disease | Admitting: Pharmacist

## 2024-09-27 DIAGNOSIS — Z7901 Long term (current) use of anticoagulants: Secondary | ICD-10-CM

## 2024-09-27 DIAGNOSIS — I4821 Permanent atrial fibrillation: Secondary | ICD-10-CM | POA: Diagnosis not present

## 2024-09-27 DIAGNOSIS — Z8673 Personal history of transient ischemic attack (TIA), and cerebral infarction without residual deficits: Secondary | ICD-10-CM | POA: Diagnosis not present

## 2024-09-27 LAB — POCT INR: INR: 4.8 — AB (ref 2.0–3.0)

## 2024-09-27 NOTE — Progress Notes (Signed)
 Description   INR 4.8: Hold dose tomorrow and then resume taking warfarin 2 tablets daily, except 3 tablets on Tuesdays and Thursdays.  Eat 3 helpings of greens per week Recheck INR in 2 weeks. Coumadin  Clinic [phone]     Patient already took warfarin this morning

## 2024-09-27 NOTE — Patient Instructions (Addendum)
 Description   INR 4.8: Hold dose tomorrow and then resume taking warfarin 2 tablets daily, except 3 tablets on Tuesdays and Thursdays.  Eat 3 helpings of greens per week Recheck INR in 2 weeks. Coumadin  Clinic [phone]

## 2024-10-07 ENCOUNTER — Ambulatory Visit (INDEPENDENT_AMBULATORY_CARE_PROVIDER_SITE_OTHER): Admitting: Cardiology

## 2024-10-07 ENCOUNTER — Encounter (HOSPITAL_BASED_OUTPATIENT_CLINIC_OR_DEPARTMENT_OTHER): Payer: Self-pay | Admitting: Cardiology

## 2024-10-07 VITALS — BP 132/64 | HR 88 | Wt 106.8 lb

## 2024-10-07 DIAGNOSIS — I349 Nonrheumatic mitral valve disorder, unspecified: Secondary | ICD-10-CM | POA: Diagnosis not present

## 2024-10-07 DIAGNOSIS — Z7901 Long term (current) use of anticoagulants: Secondary | ICD-10-CM

## 2024-10-07 DIAGNOSIS — Z952 Presence of prosthetic heart valve: Secondary | ICD-10-CM

## 2024-10-07 DIAGNOSIS — Z8673 Personal history of transient ischemic attack (TIA), and cerebral infarction without residual deficits: Secondary | ICD-10-CM

## 2024-10-07 DIAGNOSIS — I4821 Permanent atrial fibrillation: Secondary | ICD-10-CM

## 2024-10-07 DIAGNOSIS — I2510 Atherosclerotic heart disease of native coronary artery without angina pectoris: Secondary | ICD-10-CM | POA: Diagnosis not present

## 2024-10-07 DIAGNOSIS — I050 Rheumatic mitral stenosis: Secondary | ICD-10-CM

## 2024-10-07 NOTE — Patient Instructions (Signed)

## 2024-10-07 NOTE — Progress Notes (Signed)
 Cardiology Office Note:  .   Date:  10/07/2024  ID:  MAKENZIE WEISNER, DOB 08-13-1961, MRN [id number] PCP: de Cuba, Raymond J, MD   HeartCare Providers Cardiologist:  Shelda Bruckner, MD {  History of Present Illness: .   Christy Poole is a 63 y.o. 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 Initially seen by Dr. Francyne in the hospital 11/2021.   Cardiac history: Rheumatic mitral stenosis, s/p balloon valvuloplasty in [place] in ~2016/2017.  History of LAD stent 2016. [place] in [place] and then [facility] in [place]. History of TIA/CVA as well, initial 2019 and most recent 11/2021. Echo 11/2021 with moderate MS, Wilkins score 7-8. Permanent atrial fibrillation. On coumadin .  Underwent mitral valve replacement 11/29/23 with Dr. Maryjane, now has 27 mm St Jude mechanical mitral valve. Also s/p LAA closure.   CV risk factors: She is vegetarian and eats primarily Indian food. She does not exercise regularly. Father had MI, brother had bypass surgery.  Today: Here with family today, son serves as equities trader. Doing well overall. Able to do all her day to day activities without any symptoms. Reviewed signs/symptoms to watch for, how we monitor valve. Walks without limitations. Sometimes has brief shortness of breath but this is intermittent and mild. No palpitations. Has chronic chest discomfort intermittently, intermittent, sometimes at rest/when sleeping, sometimes when walking. Brief, mild. Unchanged from prior to surgery, and cath was without significant CAD.  No major bleeding issues since she saw Rosaline, when she had bleeding in her ear. No blood in the stool/melena, no hematuria. Reviewed reason for coumadin . Reviewed vitamin K rich foods, given list today.  ROS: Denies shortness of  breath at rest or with normal exertion. No PND, orthopnea, LE edema or unexpected weight gain. No syncope or palpitations. ROS otherwise negative except as noted.    Studies Reviewed: SABRA    EKG:       Physical Exam:   VS:  BP 132/64 (BP Location: Left Arm, Patient Position: Sitting, Cuff Size: Normal)   Pulse 88   Wt 106 lb 12.8 oz (48.4 kg)   SpO2 97%   BMI 22.32 kg/m    Wt Readings from Last 3 Encounters:  10/07/24 106 lb 12.8 oz (48.4 kg)  09/17/24 104 lb 8 oz (47.4 kg)  07/10/24 104 lb 0.9 oz (47.2 kg)    GEN: Well nourished, well developed in no acute distress HEENT: Normal, moist mucous membranes NECK: No JVD CARDIAC: irregularly irregular rhythm, normal S1 and S2, no rubs or gallops. Crisp mechanical click. VASCULAR: Radial and DP pulses 2+ bilaterally. No carotid bruits RESPIRATORY:  Clear to auscultation without rales, wheezing or rhonchi  ABDOMEN: Soft, non-tender, non-distended MUSCULOSKELETAL:  Ambulates independently SKIN: Warm and dry, no edema NEUROLOGIC:  Alert and oriented x 3. No focal neuro deficits noted. PSYCHIATRIC:  Normal affect    ASSESSMENT AND PLAN: .     Rheumatic mitral stenosis, s/p mechanical mitral valve 27 mm 11/29/23 with Dr. Maryjane -continue aspirin  for mechanical valve, CAD, CVA -continue coumadin , goal 2.5 -3.5 -reviewed vitamin K rich foods, eating a stable diet -discussed need for antibiotic prophylaxis before dental visits -reviewed red flag warning signs that need immediate medical attention   Permanent atrial fibrillation, now s/p LAA closure with MV surgery 11/29/23 Prior TIA History of CVA  in 2019 with left facial and left arm deficits -given that this is valvular atrial fibrillation and now with mechanical valve, needs to be on coumadin  (no DOAC) -on aspirin , atorvastatin  as well as coumadin , denies bleeding issues -on metoprolol  succinate 25 mg daily for rate control  CAD s/p PCI 2016 -Patent mLAD stent and otherwise minimal  nonobstructive CAD on cath 09/07/23 -continue aspirin , atorvastatin   Seizures History of CVA and TIA as above -followed by neurology   CV risk counseling and prevention -recommend heart healthy/Mediterranean diet, with whole grains, fruits, vegetable, fish, lean meats, nuts, and olive oil. Limit salt. -recommend moderate walking, 3-5 times/week for 30-50 minutes each session. Aim for at least 150 minutes.week. Goal should be pace of 3 miles/hours, or walking 1.5 miles in 30 minutes -recommend avoidance of tobacco products. Avoid excess alcohol.  Dispo: 6 mos or sooner as needed  Signed, Shelda Bruckner, MD   Shelda Bruckner, MD, PhD, [facility] [place]  [facility]  [place]  [facility] & V[facility] at [facility] at [facility] [address] [place], [place] [address] [phone]

## 2024-10-11 ENCOUNTER — Ambulatory Visit: Attending: Cardiovascular Disease | Admitting: Pharmacist

## 2024-10-11 ENCOUNTER — Ambulatory Visit (HOSPITAL_BASED_OUTPATIENT_CLINIC_OR_DEPARTMENT_OTHER): Admitting: Family Medicine

## 2024-10-11 VITALS — BP 134/73 | HR 93 | Temp 97.6°F | Resp 18 | Ht <= 58 in | Wt 106.0 lb

## 2024-10-11 DIAGNOSIS — Z952 Presence of prosthetic heart valve: Secondary | ICD-10-CM | POA: Insufficient documentation

## 2024-10-11 DIAGNOSIS — E1122 Type 2 diabetes mellitus with diabetic chronic kidney disease: Secondary | ICD-10-CM | POA: Diagnosis not present

## 2024-10-11 DIAGNOSIS — Z7901 Long term (current) use of anticoagulants: Secondary | ICD-10-CM | POA: Diagnosis present

## 2024-10-11 DIAGNOSIS — N182 Chronic kidney disease, stage 2 (mild): Secondary | ICD-10-CM | POA: Diagnosis not present

## 2024-10-11 DIAGNOSIS — R0609 Other forms of dyspnea: Secondary | ICD-10-CM

## 2024-10-11 DIAGNOSIS — E039 Hypothyroidism, unspecified: Secondary | ICD-10-CM | POA: Diagnosis not present

## 2024-10-11 DIAGNOSIS — I050 Rheumatic mitral stenosis: Secondary | ICD-10-CM | POA: Insufficient documentation

## 2024-10-11 DIAGNOSIS — Z8673 Personal history of transient ischemic attack (TIA), and cerebral infarction without residual deficits: Secondary | ICD-10-CM | POA: Insufficient documentation

## 2024-10-11 DIAGNOSIS — I4821 Permanent atrial fibrillation: Secondary | ICD-10-CM | POA: Diagnosis present

## 2024-10-11 DIAGNOSIS — Z7984 Long term (current) use of oral hypoglycemic drugs: Secondary | ICD-10-CM | POA: Diagnosis not present

## 2024-10-11 LAB — POCT INR: INR: 3.1 — AB (ref 2.0–3.0)

## 2024-10-11 MED ORDER — WARFARIN SODIUM 2.5 MG PO TABS: ORAL_TABLET | ORAL | 1 refills | Status: DC

## 2024-10-11 NOTE — Progress Notes (Signed)
" ° ° °  Procedures performed today:    None.  Independent interpretation of notes and tests performed by another provider:   None.  Brief History, Exam, Impression, and Recommendations:    BP 134/73 (BP Location: Left Arm, Patient Position: Sitting, Cuff Size: Normal)   Pulse 93   Temp 97.6 F (36.4 C) (Oral)   Resp 18   Ht 4' 10 (1.473 m)   Wt 106 lb (48.1 kg)   SpO2 100%   BMI 22.15 kg/m   Type 2 diabetes mellitus with stage 2 chronic kidney disease, without long-term current use of insulin  [facility]) Assessment & Plan: Patient is generally doing well.  Denies any specific concerns today.  Continues with metformin , denies any significant GI symptoms or side effects from medication. Prior hemoglobin A1c has been well-controlled.  Due for recheck at this time. Will also check urine ACR today.   Foot exam completed previously Will plan to follow-up in about 4 to 6 months or sooner as needed  Orders: -     Hemoglobin A1c -     Microalbumin / creatinine urine ratio  Hypothyroidism, unspecified type Assessment & Plan: We can proceed with recheck of TSH today.  She continues with levothyroxine  at 50 mcg dose.  We can proceed with adjusting medication as needed based on lab results.  Orders: -     TSH  Dyspnea on exertion Assessment & Plan: Patient with ongoing issues with shortness of breath on exertion.  She does follow with pulmonology, has been referred to pulmonary rehab.  She has not scheduled this as of yet.  Discussed recommendation to proceed with recommended treatment plan as per pulmonologist, patient will reach out to pulmonary rehab to schedule this.   Return in about 4 months (around 02/09/2025) for diabetes, 40 minutes.  Spent 32 minutes on this patient encounter, including preparation, chart review, face-to-face counseling with patient and coordination of care, and documentation of encounter   ___________________________________________ Vanya Carberry de [place], MD, ABFM,  CAQSM Primary Care and Sports Medicine [facility] "

## 2024-10-11 NOTE — Patient Instructions (Signed)
 Description   INR 3.1: Continue taking warfarin 2 tablets daily, except 3 tablets on Tuesdays and Thursdays.  Eat 3 helpings of greens per week Recheck INR in 4 weeks. Coumadin  Clinic [phone]

## 2024-10-11 NOTE — Assessment & Plan Note (Signed)
 Patient is generally doing well.  Denies any specific concerns today.  Continues with metformin , denies any significant GI symptoms or side effects from medication. Prior hemoglobin A1c has been well-controlled.  Due for recheck at this time. Will also check urine ACR today.   Foot exam completed previously Will plan to follow-up in about 4 to 6 months or sooner as needed

## 2024-10-11 NOTE — Progress Notes (Signed)
 Description   INR 3.1: Continue taking warfarin 2 tablets daily, except 3 tablets on Tuesdays and Thursdays.  Eat 3 helpings of greens per week Recheck INR in 4 weeks. Coumadin  Clinic [phone]

## 2024-10-12 LAB — MICROALBUMIN / CREATININE URINE RATIO
Creatinine, Urine: 30.6 mg/dL
Microalb/Creat Ratio: 71 mg/g{creat} — ABNORMAL HIGH (ref 0–29)
Microalbumin, Urine: 21.6 ug/mL

## 2024-10-12 LAB — TSH: TSH: 2.55 u[IU]/mL (ref 0.450–4.500)

## 2024-10-12 LAB — HEMOGLOBIN A1C
Est. average glucose Bld gHb Est-mCnc: 123 mg/dL
Hgb A1c MFr Bld: 5.9 % — ABNORMAL HIGH (ref 4.8–5.6)

## 2024-10-14 ENCOUNTER — Telehealth (HOSPITAL_COMMUNITY): Payer: Self-pay

## 2024-10-14 LAB — MICROALBUMIN / CREATININE URINE RATIO

## 2024-10-14 LAB — HEMOGLOBIN A1C

## 2024-10-14 NOTE — Telephone Encounter (Signed)
 Pt insurance is active and benefits verified through Medicare B. Co-pay $0, DED $257/unknown met, out of pocket $0/$0 met, co-insurance 20%. No pre-authorization required.   2ndary insurance is active and benefits verified through [facility] Medicaid. Co-pay $0, DED $0/$0 met, out of pocket $0/$0 met, co-insurance 0%. No pre-authorization required.

## 2024-10-14 NOTE — Telephone Encounter (Signed)
 Called patient to see if she was interested in participating in the Pulmonary Rehab Program. Spoke with son. Patient will come in for orientation on 1/07 and will attend the 8:15 exercise class.  Sent MyChart message.

## 2024-10-18 ENCOUNTER — Encounter: Payer: Self-pay | Admitting: *Deleted

## 2024-10-18 ENCOUNTER — Ambulatory Visit (INDEPENDENT_AMBULATORY_CARE_PROVIDER_SITE_OTHER)

## 2024-10-18 ENCOUNTER — Ambulatory Visit: Admission: EM | Admit: 2024-10-18 | Discharge: 2024-10-18 | Disposition: A | Discharge status: Home / Self Care

## 2024-10-18 ENCOUNTER — Other Ambulatory Visit: Payer: Self-pay

## 2024-10-18 DIAGNOSIS — J209 Acute bronchitis, unspecified: Secondary | ICD-10-CM | POA: Diagnosis not present

## 2024-10-18 LAB — POC COVID19/FLU A&B COMBO
Covid Antigen, POC: NEGATIVE
Influenza A Antigen, POC: NEGATIVE
Influenza B Antigen, POC: NEGATIVE

## 2024-10-18 MED ORDER — AZELASTINE HCL 0.1 % NA SOLN: 1.0000 | Freq: Two times a day (BID) | NASAL | 1 refills | Status: AC

## 2024-10-18 MED ORDER — PREDNISONE 20 MG PO TABS: 40.0000 mg | ORAL_TABLET | Freq: Every day | ORAL | 0 refills | Status: AC

## 2024-10-18 MED ORDER — PROMETHAZINE-DM 6.25-15 MG/5ML PO SYRP: 5.0000 mL | ORAL_SOLUTION | Freq: Three times a day (TID) | ORAL | 0 refills | Status: AC | PRN

## 2024-10-18 MED ORDER — AZITHROMYCIN 250 MG PO TABS: ORAL_TABLET | ORAL | 0 refills | Status: AC

## 2024-10-18 NOTE — Discharge Instructions (Signed)
" °  1. Acute bronchitis, unspecified organism (Primary) - DG Chest 2 View x-ray completed in UC shows no acute cardiopulmonary processes, no sign of consolidation or pneumonia however there is mild haziness possibly consistent with reactive airway disease or bronchitis - POC Covid19/Flu A&B Antigen complete in UC is negative for COVID and flu - predniSONE  (DELTASONE ) 20 MG tablet; Take 2 tablets (40 mg total) by mouth daily for 5 days.  Dispense: 10 tablet; Refill: 0 - azelastine  (ASTELIN ) 0.1 % nasal spray; Place 1 spray into both nostrils 2 (two) times daily. Use in each nostril as directed  Dispense: 30 mL; Refill: 1 - promethazine -dextromethorphan (PROMETHAZINE -DM) 6.25-15 MG/5ML syrup; Take 5 mLs by mouth 3 (three) times daily as needed.  Dispense: 240 mL; Refill: 0 - azithromycin  (ZITHROMAX  Z-PAK) 250 MG tablet; Take 500 mg on day 1 followed by 250 mg for 4 days.  Take medication for a total of 5 days.  Dispense: 6 tablet; Refill: 0  -Continue to monitor symptoms for any change in severity if there is any escalation of current symptoms or development of new symptoms follow-up in ER for further evaluation and management. "

## 2024-10-18 NOTE — ED Triage Notes (Signed)
 Declines interpreter. Per family, pt started with cough, rhinorrhea, body aches approx 2 wks ago. Has tried OTC cough med. Unsure if fevers.

## 2024-10-18 NOTE — ED Provider Notes (Signed)
 " [facility]  Note:  This document was prepared using Dragon voice recognition software and may include unintentional dictation errors.  MRN: [id number] DOB: October 29, 1960  Subjective:   Christy Poole is a 63 y.o. female presenting for persistent cough x 2 weeks with nasal congestion and bodyaches.  Patient has been using over-the-counter cough medication with minimal improvement of symptoms.  Denies any known fever, shortness of breath, chest pain, weakness, dizziness.  Denies any known sick contacts other than family member who has very similar symptoms.  Current Medications[1]   Allergies[2]  Past Medical History:  Diagnosis Date   Atrial fibrillation (HCC)    Coronary artery disease    s/p LAD stents   Diabetes mellitus without complication (HCC)    Dyspnea    Headache    HLD (hyperlipidemia)    Hypertension    Memory loss    due to stroke in 2024   Neuromuscular disorder (HCC)    feet   Pneumonia    x 1   Seizure (HCC)    last one was in 09/2023   Stroke [facility] 2019   Thyroid disease    Walker as ambulation aid      Past Surgical History:  Procedure Laterality Date   MITRAL VALVE REPLACEMENT N/A 11/29/2023   Procedure: MITRAL VALVE (MV) REPLACEMENT USING [employer] MA[employer]ERS MECHANICAL VALVE;  Surgeon: Maryjane Mt, MD;  Location: [facility] OR;  Service: Open Heart Surgery;  Laterality: N/A;   RIGHT/LEFT HEART CATH AND CORONARY ANGIOGRAPHY N/A 09/07/2023   Procedure: RIGHT/LEFT HEART CATH AND CORONARY ANGIOGRAPHY;  Surgeon: Wendel Lurena POUR, MD;  Location: [facility] INVASIVE CV LAB;  Service: Cardiovascular;  Laterality: N/A;   TEE WITHOUT CARDIOVERSION N/A 11/29/2023   Procedure: TRANSESOPHAGEAL ECHOCARDIOGRAM (TEE);  Surgeon: Maryjane Mt, MD;  Location: [facility] OR;  Service: Open Heart Surgery;  Laterality: N/A;   TRANSESOPHAGEAL ECHOCARDIOGRAM (CATH LAB) N/A 10/03/2023   Procedure: TRANSESOPHAGEAL ECHOCARDIOGRAM;  Surgeon: Raford Riggs, MD;  Location: [facility] INVASIVE CV  LAB;  Service: Cardiovascular;  Laterality: N/A;    No family history on file.  Social History[3]  ROS Refer to HPI for ROS details.  Objective:    Vitals: BP (!) 164/84   Pulse 72   Temp 97.9 F (36.6 C) (Oral)   Resp (!) 22   SpO2 96%   Physical Exam Vitals and nursing note reviewed.  Constitutional:      General: She is not in acute distress.    Appearance: Normal appearance. She is well-developed. She is not ill-appearing or toxic-appearing.  HENT:     Head: Normocephalic and atraumatic.     Nose: Congestion and rhinorrhea present.     Mouth/Throat:     Mouth: Mucous membranes are moist.     Pharynx: Oropharynx is clear.  Cardiovascular:     Rate and Rhythm: Normal rate and regular rhythm.     Heart sounds: Normal heart sounds.  Pulmonary:     Effort: Pulmonary effort is normal. No respiratory distress.     Breath sounds: Normal breath sounds. No stridor. No rhonchi or rales.  Chest:     Chest wall: No tenderness.  Skin:    General: Skin is warm and dry.  Neurological:     General: No focal deficit present.     Mental Status: She is alert and oriented to person, place, and time.  Psychiatric:        Mood and Affect: Mood normal.  Behavior: Behavior normal.     Procedures  Results for orders placed or performed during the hospital encounter of 10/18/24 (from the past 24 hours)  POC Covid19/Flu A&B Antigen     Status: None   Collection Time: 10/18/24  7:54 PM  Result Value Ref Range   Influenza A Antigen, POC Negative Negative   Influenza B Antigen, POC Negative Negative   Covid Antigen, POC Negative Negative    Assessment and Plan :     Discharge Instructions       1. Acute bronchitis, unspecified organism (Primary) - DG Chest 2 View x-ray completed in UC shows no acute cardiopulmonary processes, no sign of consolidation or pneumonia however there is mild haziness possibly consistent with reactive airway disease or bronchitis - POC  Covid19/Flu A&B Antigen complete in UC is negative for COVID and flu - predniSONE  (DELTASONE ) 20 MG tablet; Take 2 tablets (40 mg total) by mouth daily for 5 days.  Dispense: 10 tablet; Refill: 0 - azelastine  (ASTELIN ) 0.1 % nasal spray; Place 1 spray into both nostrils 2 (two) times daily. Use in each nostril as directed  Dispense: 30 mL; Refill: 1 - promethazine -dextromethorphan (PROMETHAZINE -DM) 6.25-15 MG/5ML syrup; Take 5 mLs by mouth 3 (three) times daily as needed.  Dispense: 240 mL; Refill: 0 - azithromycin  (ZITHROMAX  Z-PAK) 250 MG tablet; Take 500 mg on day 1 followed by 250 mg for 4 days.  Take medication for a total of 5 days.  Dispense: 6 tablet; Refill: 0  -Continue to monitor symptoms for any change in severity if there is any escalation of current symptoms or development of new symptoms follow-up in ER for further evaluation and management.       Pio Eatherly B Laterica Matarazzo    [1] No current facility-administered medications for this encounter.  Current Outpatient Medications:    ASPIRIN  LOW DOSE 81 MG tablet, TAKE 1 TABLET (81 MG TOTAL) BY MOUTH DAILY. (SWALLOW WHOLE), Disp: 90 tablet, Rfl: 3   atorvastatin  (LIPITOR ) 80 MG tablet, Take 1 tablet (80 mg total) by mouth daily., Disp: 90 tablet, Rfl: 3   azelastine  (ASTELIN ) 0.1 % nasal spray, Place 1 spray into both nostrils 2 (two) times daily. Use in each nostril as directed, Disp: 30 mL, Rfl: 1   azithromycin  (ZITHROMAX  Z-PAK) 250 MG tablet, Take 500 mg on day 1 followed by 250 mg for 4 days.  Take medication for a total of 5 days., Disp: 6 tablet, Rfl: 0   gabapentin  (NEURONTIN ) 100 MG capsule, Take 1 capsule (100 mg total) by mouth at bedtime., Disp: 60 capsule, Rfl: 1   levETIRAcetam  (KEPPRA ) 250 MG tablet, Take 1 tablet (250 mg total) by mouth 2 (two) times daily., Disp: 180 tablet, Rfl: 3   levothyroxine  (SYNTHROID ) 50 MCG tablet, TAKE 1 TABLET (50 MCG TOTAL) BY MOUTH AT BEDTIME., Disp: 90 tablet, Rfl: 0   metFORMIN   (GLUCOPHAGE -XR) 500 MG 24 hr tablet, TAKE 1 TABLET (500 MG TOTAL) BY MOUTH DAILY WITH BREAKFAST., Disp: 90 tablet, Rfl: 1   metoprolol  succinate (TOPROL -XL) 25 MG 24 hr tablet, Take 1 tablet (25 mg total) by mouth daily. Take with or immediately following a meal., Disp: 90 tablet, Rfl: 3   mometasone -formoterol  (DULERA ) 100-5 MCG/ACT AERO, Inhale 2 puffs into the lungs 2 (two) times daily as needed for wheezing or shortness of breath., Disp: , Rfl:    predniSONE  (DELTASONE ) 20 MG tablet, Take 2 tablets (40 mg total) by mouth daily for 5 days., Disp: 10 tablet, Rfl: 0  promethazine -dextromethorphan (PROMETHAZINE -DM) 6.25-15 MG/5ML syrup, Take 5 mLs by mouth 3 (three) times daily as needed., Disp: 240 mL, Rfl: 0   warfarin (COUMADIN ) 2.5 MG tablet, Take 2 to 3 tablets by mouth daily or as directed by Coumad[facility]  Clinic, Disp: 90 tablet, Rfl: 1   acetaminophen  (TYLENOL ) 500 MG tablet, Take 500 mg by mouth every 6 (six) hours as needed for mild pain (pain score 1-3), headache or fever., Disp: , Rfl:    albuterol  (VENTOLIN  HFA) 108 (90 Base) MCG/ACT inhaler, Inhale 1-2 puffs into the lungs every 4 (four) hours as needed for wheezing or shortness of breath., Disp: 1 each, Rfl: 2   cetaphil (CETAPHIL) lotion, Apply 1 Application topically daily as needed for dry skin., Disp: , Rfl:  [2]  Allergies Allergen Reactions   Other Other (See Comments)    Patient is vegetarian no meat can eat eggs  [3]  Social History Tobacco Use   Smoking status: Never    Passive exposure: Never   Smokeless tobacco: Never  Vaping Use   Vaping status: Never Used  Substance Use Topics   Alcohol use: Never   Drug use: Never     Aurea Goodell B, NP 10/18/24 2021  "

## 2024-10-22 ENCOUNTER — Ambulatory Visit (HOSPITAL_COMMUNITY): Payer: Self-pay

## 2024-10-29 ENCOUNTER — Telehealth (HOSPITAL_COMMUNITY): Payer: Self-pay

## 2024-10-29 NOTE — Telephone Encounter (Signed)
 Attempted to LVM for pt to confirm appt for [facility] tomorrow. Unable to leave message

## 2024-10-30 ENCOUNTER — Encounter (HOSPITAL_COMMUNITY)
Admission: RE | Admit: 2024-10-30 | Discharge: 2024-10-30 | Disposition: A | Discharge status: Home / Self Care | Source: Ambulatory Visit | Attending: Pulmonary Disease | Admitting: Pulmonary Disease

## 2024-10-30 ENCOUNTER — Encounter (HOSPITAL_COMMUNITY): Payer: Self-pay

## 2024-10-30 VITALS — BP 102/64 | HR 90 | Ht 59.0 in | Wt 105.2 lb

## 2024-10-30 DIAGNOSIS — R0602 Shortness of breath: Secondary | ICD-10-CM | POA: Insufficient documentation

## 2024-10-30 NOTE — Progress Notes (Addendum)
 Pulmonary Individual Treatment Plan  Patient Details  Name: Christy Poole MRN: [id number] Date of Birth: [birth year] Referring Provider:   Flowsheet Row PULMONARY REHAB OTHER RESP ORIENTATION from 10/30/2024 in [facility]  Referring Provider Kassie    Initial Encounter Date:  Flowsheet Row PULMONARY REHAB OTHER RESP ORIENTATION from 10/30/2024 in [facility] for [facility], [facility]  Date 10/30/24    Visit Diagnosis: Shortness of breath  Patient's Home Medications on Admission:  Current Medications[1]  Past Medical History: Past Medical History:  Diagnosis Date   Atrial fibrillation (HCC)    Coronary artery disease    s/p LAD stents   Diabetes mellitus without complication (HCC)    Dyspnea    Headache    HLD (hyperlipidemia)    Hypertension    Memory loss    due to stroke in 2024   Neuromuscular disorder (HCC)    feet   Pneumonia    x 1   Seizure (HCC)    last one was in 09/2023   Stroke [facility]) 2019   Thyroid disease    Walker as ambulation aid     Tobacco Use: Tobacco Use History[2]  Labs: Review Flowsheet  More data exists      Latest Ref Rng & Units 09/12/2023 11/27/2023 11/29/2023 06/11/2024 10/11/2024  Labs for ITP Cardiac and Pulmonary Rehab  Hemoglobin A1c 4.8 - 5.6 % 7.0  5.8  - 6.0  6.0  6.0  6.0  5.9  CANCELED   PH, Arterial 7.35 - 7.45 - - 7.346  7.300  7.290  7.339  7.394  7.390  - -  PCO2 arterial 32 - 48 mmHg - - 42.4  41.8  46.7  44.4  31.5  36.6  - -  Bicarbonate 20.0 - 28.0 mmol/L - - 23.2  20.6  22.7  23.9  25.6  19.3  22.1  - -  TCO2 22 - 32 mmol/L - - 25  22  24  25  25  26  27  20  24  22  23   - -  Acid-base deficit 0.0 - 2.0 mmol/L - - 2.0  5.0  4.0  2.0  1.0  5.0  2.0  - -  O2 Saturation % - - 94  93  97  100  84  100  100  - -    Details       Multiple values from one day are sorted in reverse-chronological order         Capillary Blood  Glucose: Lab Results  Component Value Date   GLUCAP 158 (H) 06/07/2024   GLUCAP 119 (H) 05/17/2024   GLUCAP 92 05/06/2024   GLUCAP 123 (H) 05/06/2024   GLUCAP 88 05/03/2024     Pulmonary Assessment Scores:  Pulmonary Assessment Scores     Row Name 10/30/24 0928         ADL UCSD   ADL Phase Entry     SOB Score total 53       CAT Score   CAT Score 22       mMRC Score   mMRC Score 4       UCSD: Self-administered rating of dyspnea associated with activities of daily living (ADLs) 6-point scale (0 = not at all to 5 = maximal or unable to do because of breathlessness)  Scoring Scores range from 0 to 120.  Minimally important difference is 5  units  CAT: CAT can identify the health impairment of COPD patients and is better correlated with disease progression.  CAT has a scoring range of zero to 40. The CAT score is classified into four groups of low (less than 10), medium (10 - 20), high (21-30) and very high (31-40) based on the impact level of disease on health status. A CAT score over 10 suggests significant symptoms.  A worsening CAT score could be explained by an exacerbation, poor medication adherence, poor inhaler technique, or progression of COPD or comorbid conditions.  CAT MCID is 2 points  mMRC: mMRC (Modified Medical Research Council) Dyspnea Scale is used to assess the degree of baseline functional disability in patients of respiratory disease due to dyspnea. No minimal important difference is established. A decrease in score of 1 point or greater is considered a positive change.   Pulmonary Function Assessment:  Pulmonary Function Assessment - 10/30/24 0920       Breath   Bilateral Breath Sounds Clear    Shortness of Breath Yes;Fear of Shortness of Breath;Limiting activity          Exercise Target Goals: Exercise Program Goal: Individual exercise prescription set using results from initial 6 min walk test and THRR while considering  patients  activity barriers and safety.   Exercise Prescription Goal: Initial exercise prescription builds to 30-45 minutes a day of aerobic activity, 2-3 days per week.  Home exercise guidelines will be given to patient during program as part of exercise prescription that the participant will acknowledge.  Activity Barriers & Risk Stratification:  Activity Barriers & Cardiac Risk Stratification - 10/30/24 0915       Activity Barriers & Cardiac Risk Stratification   Activity Barriers Assistive Device;Deconditioning;Balance Concerns;Muscular Weakness;Shortness of Breath          6 Minute Walk:  6 Minute Walk     Row Name 10/30/24 1034         6 Minute Walk   Phase Initial     Distance 1088 feet     Walk Time 6 minutes     # of Rest Breaks 0     MPH 2.06     METS 1.91     RPE 13     Perceived Dyspnea  2     VO2 Peak 6.68     Symptoms No     Resting HR 90 bpm     Resting BP 102/64     Resting Oxygen Saturation  99 %     Exercise Oxygen Saturation  during 6 min walk 93 %     Max Ex. HR 95 bpm     Max Ex. BP 114/60     2 Minute Post BP 100/60       Interval HR   1 Minute HR 88     2 Minute HR 95     3 Minute HR 82     4 Minute HR 81     5 Minute HR 79     6 Minute HR 78     2 Minute Post HR 78     Interval Heart Rate? Yes       Interval Oxygen   Interval Oxygen? Yes     Baseline Oxygen Saturation % 99 %     1 Minute Oxygen Saturation % 94 %     1 Minute Liters of Oxygen 0 L     2 Minute Oxygen Saturation % 95 %  2 Minute Liters of Oxygen 0 L     3 Minute Oxygen Saturation % 95 %     3 Minute Liters of Oxygen 0 L     4 Minute Oxygen Saturation % 94 %     4 Minute Liters of Oxygen 0 L     5 Minute Oxygen Saturation % 93 %     5 Minute Liters of Oxygen 0 L     6 Minute Oxygen Saturation % 97 %     6 Minute Liters of Oxygen 0 L     2 Minute Post Oxygen Saturation % 99 %     2 Minute Post Liters of Oxygen 0 L        Oxygen Initial Assessment:  Oxygen Initial  Assessment - 10/30/24 0914       Home Oxygen   Home Oxygen Device None    Sleep Oxygen Prescription None    Home Exercise Oxygen Prescription None    Home Resting Oxygen Prescription None      Initial 6 min Walk   Oxygen Used None      Program Oxygen Prescription   Program Oxygen Prescription None      Intervention   Short Term Goals To learn and understand importance of monitoring SPO2 with pulse oximeter and demonstrate accurate use of the pulse oximeter.;To learn and understand importance of maintaining oxygen saturations>88%;To learn and demonstrate proper pursed lip breathing techniques or other breathing techniques. ;To learn and demonstrate proper use of respiratory medications    Long  Term Goals Verbalizes importance of monitoring SPO2 with pulse oximeter and return demonstration;Maintenance of O2 saturations>88%;Exhibits proper breathing techniques, such as pursed lip breathing or other method taught during program session;Compliance with respiratory medication          Oxygen Re-Evaluation:   Oxygen Discharge (Final Oxygen Re-Evaluation):   Initial Exercise Prescription:  Initial Exercise Prescription - 10/30/24 1000       Date of Initial Exercise RX and Referring Provider   Date 10/30/24    Referring Provider Kassie    Expected Discharge Date 01/21/25      NuStep   Level 1    SPM 80    Minutes 15    METs 2.5      Track   Minutes 15    METs 2      Prescription Details   Frequency (times per week) 2    Duration Progress to 30 minutes of continuous aerobic without signs/symptoms of physical distress      Intensity   THRR 40-80% of Max Heartrate 62-124    Ratings of Perceived Exertion 11-13    Perceived Dyspnea 0-4      Progression   Progression Continue progressive overload as per policy without signs/symptoms or physical distress.      Resistance Training   Training Prescription Yes    Weight red bands    Reps 10-15          Perform  Capillary Blood Glucose checks as needed.  Exercise Prescription Changes:   Exercise Comments:   Exercise Goals and Review:   Exercise Goals     Row Name 10/30/24 0913             Exercise Goals   Increase Physical Activity Yes       Intervention Provide advice, education, support and counseling about physical activity/exercise needs.;Develop an individualized exercise prescription for aerobic and resistive training based on initial evaluation findings, risk stratification, comorbidities and  participant's personal goals.       Expected Outcomes Short Term: Attend rehab on a regular basis to increase amount of physical activity.;Long Term: Exercising regularly at least 3-5 days a week.;Long Term: Add in home exercise to make exercise part of routine and to increase amount of physical activity.       Increase Strength and Stamina Yes       Intervention Provide advice, education, support and counseling about physical activity/exercise needs.;Develop an individualized exercise prescription for aerobic and resistive training based on initial evaluation findings, risk stratification, comorbidities and participant's personal goals.       Expected Outcomes Short Term: Increase workloads from initial exercise prescription for resistance, speed, and METs.;Short Term: Perform resistance training exercises routinely during rehab and add in resistance training at home;Long Term: Improve cardiorespiratory fitness, muscular endurance and strength as measured by increased METs and functional capacity ( )       Able to understand and use rate of perceived exertion (RPE) scale Yes       Intervention Provide education and explanation on how to use RPE scale       Expected Outcomes Long Term:  Able to use RPE to guide intensity level when exercising independently;Short Term: Able to use RPE daily in rehab to express subjective intensity level       Able to understand and use Dyspnea scale Yes        Intervention Provide education and explanation on how to use Dyspnea scale       Expected Outcomes Short Term: Able to use Dyspnea scale daily in rehab to express subjective sense of shortness of breath during exertion       Knowledge and understanding of Target Heart Rate Range (THRR) Yes       Intervention Provide education and explanation of THRR including how the numbers were predicted and where they are located for reference       Expected Outcomes Short Term: Able to state/look up THRR;Long Term: Able to use THRR to govern intensity when exercising independently;Short Term: Able to use daily as guideline for intensity in rehab       Understanding of Exercise Prescription Yes       Intervention Provide education, explanation, and written materials on patient's individual exercise prescription       Expected Outcomes Short Term: Able to explain program exercise prescription;Long Term: Able to explain home exercise prescription to exercise independently          Exercise Goals Re-Evaluation :   Discharge Exercise Prescription (Final Exercise Prescription Changes):   Nutrition:  Target Goals: Understanding of nutrition guidelines, daily intake of sodium 1500mg , cholesterol 200mg , calories 30% from fat and 7% or less from saturated fats, daily to have 5 or more servings of fruits and vegetables.  Biometrics:    Nutrition Therapy Plan and Nutrition Goals:   Nutrition Assessments:  MEDIFICTS Score Key: >=70 Need to make dietary changes  40-70 Heart Healthy Diet <= 40 Therapeutic Level Cholesterol Diet   Picture Your Plate Scores: <59 Unhealthy dietary pattern with much room for improvement. 41-50 Dietary pattern unlikely to meet recommendations for good health and room for improvement. 51-60 More healthful dietary pattern, with some room for improvement.  >60 Healthy dietary pattern, although there may be some specific behaviors that could be improved.    Nutrition Goals  Re-Evaluation:   Nutrition Goals Discharge (Final Nutrition Goals Re-Evaluation):   Psychosocial: Target Goals: Acknowledge presence or absence of significant depression and/or stress,  maximize coping skills, provide positive support system. Participant is able to verbalize types and ability to use techniques and skills needed for reducing stress and depression.  Initial Review & Psychosocial Screening:  Initial Psych Review & Screening - 10/30/24 0916       Initial Review   Current issues with Current Stress Concerns    Source of Stress Concerns Chronic Illness    Comments Will continue to montior and offer supoprt as needed.      Family Dynamics   Good Support System? Yes    Comments She has good support from her son.      Barriers   Psychosocial barriers to participate in program The patient should benefit from training in stress management and relaxation.;Psychosocial barriers identified (see note)      Screening Interventions   Interventions Encouraged to exercise;To provide support and resources with identified psychosocial needs;Provide feedback about the scores to participant    Expected Outcomes Short Term goal: Utilizing psychosocial counselor, staff and physician to assist with identification of specific Stressors or current issues interfering with healing process. Setting desired goal for each stressor or current issue identified.;Long Term Goal: Stressors or current issues are controlled or eliminated.;Short Term goal: Identification and review with participant of any Quality of Life or Depression concerns found by scoring the questionnaire.;Long Term goal: The participant improves quality of Life and PHQ9 Scores as seen by post scores and/or verbalization of changes          Quality of Life Scores:  Scores of 19 and below usually indicate a poorer quality of life in these areas.  A difference of  2-3 points is a clinically meaningful difference.  A difference of 2-3  points in the total score of the Quality of Life Index has been associated with significant improvement in overall quality of life, self-image, physical symptoms, and general health in studies assessing change in quality of life.  PHQ-9: Review Flowsheet  More data exists      10/30/2024 06/11/2024 04/23/2024 01/16/2024 11/06/2023  Depression screen PHQ 2/9  Decreased Interest 0 0 2 0 0  Down, Depressed, Hopeless 0 0 1 0 0  PHQ - 2 Score 0 0 3 0 0  Altered sleeping 1 0 1 0 -  Tired, decreased energy 1 0 2 0 -  Change in appetite 0 0 0 0 -  Feeling bad or failure about yourself  0 0 0 0 -  Trouble concentrating 0 0 0 0 -  Moving slowly or fidgety/restless 0 0 0 0 -  Suicidal thoughts 0 0 0 0 -  PHQ-9 Score 2 0  6  0  -  Difficult doing work/chores Somewhat difficult Not difficult at all Not difficult at all Not difficult at all -    Details       Data saved with a previous flowsheet row definition        Interpretation of Total Score  Total Score Depression Severity:  1-4 = Minimal depression, 5-9 = Mild depression, 10-14 = Moderate depression, 15-19 = Moderately severe depression, 20-27 = Severe depression   Psychosocial Evaluation and Intervention:  Psychosocial Evaluation - 10/30/24 0916       Psychosocial Evaluation & Interventions   Interventions Stress management education;Relaxation education;Encouraged to exercise with the program and follow exercise prescription    Comments Stress concerns due to chronic illness.    Expected Outcomes For Alleigh to participate in rehab free of psychosocial concerns.    Continue Psychosocial Services  Follow up required by staff          Psychosocial Re-Evaluation:   Psychosocial Discharge (Final Psychosocial Re-Evaluation):   Education: Education Goals: Education classes will be provided on a weekly basis, covering required topics. Participant will state understanding/return demonstration of topics presented.  Learning  Barriers/Preferences:  Learning Barriers/Preferences - 10/30/24 9082       Learning Barriers/Preferences   Learning Barriers Language;Sight    Learning Preferences Individual Instruction          Education Topics: Know Your Numbers Group instruction that is supported by a PowerPoint presentation. Instructor discusses importance of knowing and understanding resting, exercise, and post-exercise oxygen saturation, heart rate, and blood pressure. Oxygen saturation, heart rate, blood pressure, rating of perceived exertion, and dyspnea are reviewed along with a normal range for these values.    Exercise for the Pulmonary Patient Group instruction that is supported by a PowerPoint presentation. Instructor discusses benefits of exercise, core components of exercise, frequency, duration, and intensity of an exercise routine, importance of utilizing pulse oximetry during exercise, safety while exercising, and options of places to exercise outside of rehab.    MET Level  Group instruction provided by PowerPoint, verbal discussion, and written material to support subject matter. Instructor reviews what METs are and how to increase METs.    Pulmonary Medications Verbally interactive group education provided by instructor with focus on inhaled medications and proper administration.   Anatomy and Physiology of the Respiratory System Group instruction provided by PowerPoint, verbal discussion, and written material to support subject matter. Instructor reviews respiratory cycle and anatomical components of the respiratory system and their functions. Instructor also reviews differences in obstructive and restrictive respiratory diseases with examples of each.    Oxygen Safety Group instruction provided by PowerPoint, verbal discussion, and written material to support subject matter. There is an overview of What is Oxygen and Why do we need it.  Instructor also reviews how to create a safe  environment for oxygen use, the importance of using oxygen as prescribed, and the risks of noncompliance. There is a brief discussion on traveling with oxygen and resources the patient may utilize.   Oxygen Use Group instruction provided by PowerPoint, verbal discussion, and written material to discuss how supplemental oxygen is prescribed and different types of oxygen supply systems. Resources for more information are provided.    Breathing Techniques Group instruction that is supported by demonstration and informational handouts. Instructor discusses the benefits of pursed lip and diaphragmatic breathing and detailed demonstration on how to perform both.     Risk Factor Reduction Group instruction that is supported by a PowerPoint presentation. Instructor discusses the definition of a risk factor, different risk factors for pulmonary disease, and how the heart and lungs work together.   Pulmonary Diseases Group instruction provided by PowerPoint, verbal discussion, and written material to support subject matter. Instructor gives an overview of the different type of pulmonary diseases. There is also a discussion on risk factors and symptoms as well as ways to manage the diseases.   Stress and Energy Conservation Group instruction provided by PowerPoint, verbal discussion, and written material to support subject matter. Instructor gives an overview of stress and the impact it can have on the body. Instructor also reviews ways to reduce stress. There is also a discussion on energy conservation and ways to conserve energy throughout the day.   Warning Signs and Symptoms Group instruction provided by PowerPoint, verbal discussion, and written material to support subject matter. Instructor  reviews warning signs and symptoms of stroke, heart attack, cold and flu. Instructor also reviews ways to prevent the spread of infection.   Other Education Group or individual verbal, written, or video  instructions that support the educational goals of the pulmonary rehab program.    Knowledge Questionnaire Score:  Knowledge Questionnaire Score - 10/30/24 1036       Knowledge Questionnaire Score   Pre Score 11/18          Core Components/Risk Factors/Patient Goals at Admission:  Personal Goals and Risk Factors at Admission - 10/30/24 0917       Core Components/Risk Factors/Patient Goals on Admission   Improve shortness of breath with ADL's Yes    Intervention Provide education, individualized exercise plan and daily activity instruction to help decrease symptoms of SOB with activities of daily living.    Expected Outcomes Short Term: Improve cardiorespiratory fitness to achieve a reduction of symptoms when performing ADLs;Long Term: Be able to perform more ADLs without symptoms or delay the onset of symptoms    Increase knowledge of respiratory medications and ability to use respiratory devices properly  Yes    Intervention Provide education and demonstration as needed of appropriate use of medications, inhalers, and oxygen therapy.    Expected Outcomes Short Term: Achieves understanding of medications use. Understands that oxygen is a medication prescribed by physician. Demonstrates appropriate use of inhaler and oxygen therapy.;Long Term: Maintain appropriate use of medications, inhalers, and oxygen therapy.    Diabetes Yes          Core Components/Risk Factors/Patient Goals Review:    Core Components/Risk Factors/Patient Goals at Discharge (Final Review):    ITP Comments: Dr. Slater Staff is Medical Director for Pulmonary Rehab at [facility].      [1]  Current Outpatient Medications:    acetaminophen  (TYLENOL ) 500 MG tablet, Take 500 mg by mouth every 6 (six) hours as needed for mild pain (pain score 1-3), headache or fever., Disp: , Rfl:    albuterol  (VENTOLIN  HFA) 108 (90 Base) MCG/ACT inhaler, Inhale 1-2 puffs into the lungs every 4 (four) hours as needed  for wheezing or shortness of breath., Disp: 1 each, Rfl: 2   ASPIRIN  LOW DOSE 81 MG tablet, TAKE 1 TABLET (81 MG TOTAL) BY MOUTH DAILY. (SWALLOW WHOLE), Disp: 90 tablet, Rfl: 3   atorvastatin  (LIPITOR ) 80 MG tablet, Take 1 tablet (80 mg total) by mouth daily., Disp: 90 tablet, Rfl: 3   azelastine  (ASTELIN ) 0.1 % nasal spray, Place 1 spray into both nostrils 2 (two) times daily. Use in each nostril as directed, Disp: 30 mL, Rfl: 1   azithromycin  (ZITHROMAX  Z-PAK) 250 MG tablet, Take 500 mg on day 1 followed by 250 mg for 4 days.  Take medication for a total of 5 days., Disp: 6 tablet, Rfl: 0   cetaphil (CETAPHIL) lotion, Apply 1 Application topically daily as needed for dry skin., Disp: , Rfl:    gabapentin  (NEURONTIN ) 100 MG capsule, Take 1 capsule (100 mg total) by mouth at bedtime., Disp: 60 capsule, Rfl: 1   levETIRAcetam  (KEPPRA ) 250 MG tablet, Take 1 tablet (250 mg total) by mouth 2 (two) times daily., Disp: 180 tablet, Rfl: 3   levothyroxine  (SYNTHROID ) 50 MCG tablet, TAKE 1 TABLET (50 MCG TOTAL) BY MOUTH AT BEDTIME., Disp: 90 tablet, Rfl: 0   metFORMIN  (GLUCOPHAGE -XR) 500 MG 24 hr tablet, TAKE 1 TABLET (500 MG TOTAL) BY MOUTH DAILY WITH BREAKFAST., Disp: 90 tablet, Rfl: 1   metoprolol   succinate (TOPROL -XL) 25 MG 24 hr tablet, Take 1 tablet (25 mg total) by mouth daily. Take with or immediately following a meal., Disp: 90 tablet, Rfl: 3   mometasone -formoterol  (DULERA ) 100-5 MCG/ACT AERO, Inhale 2 puffs into the lungs 2 (two) times daily as needed for wheezing or shortness of breath., Disp: , Rfl:    promethazine -dextromethorphan (PROMETHAZINE -DM) 6.25-15 MG/5ML syrup, Take 5 mLs by mouth 3 (three) times daily as needed., Disp: 240 mL, Rfl: 0   warfarin (COUMADIN ) 2.5 MG tablet, Take 2 to 3 tablets by mouth daily or as directed by Coumadin  Clinic, Disp: 90 tablet, Rfl: 1 [2]  Social History Tobacco Use  Smoking Status Never   Passive exposure: Never  Smokeless Tobacco Never

## 2024-10-30 NOTE — Progress Notes (Signed)
 ONLY speaks Gujarati

## 2024-10-30 NOTE — Progress Notes (Addendum)
 Christy Poole 64 y.o. female Pulmonary Rehab Orientation Note This patient who was referred to Pulmonary Rehab by Dr. Kassie with the diagnosis of SOB arrived today in Cardiac and Pulmonary Rehab. She  arrived ambulatory with normal gait. She  does not carry portable oxygen. Per patient, Christy Poole uses oxygen never. Color good, skin warm and dry. Patient is oriented to time and place. Patient's medical history, psychosocial health, and medications reviewed. Psychosocial assessment reveals patient lives with family. Christy Poole is currently retired. Patient hobbies include watching tv and spending time with others. Patient reports her stress level is low. Areas of stress/anxiety include health. Patient does not exhibit signs of depression. PHQ2/9 score 0/2. Pt is forgetful as she attributes this to some of her stress. Christy Poole shows good  coping skills with positive outlook on life. Offered emotional support and reassurance. Will continue to monitor. Physical assessment performed by Christy Levin RN. Please see their orientation physical assessment note. Christy Poole reports she does take medications as prescribed. Patient states she follows a vegetarian diet. The patient reports no specific efforts to gain or lose weight.. Patient's weight will be monitored closely. Demonstration and practice of PLB using pulse oximeter. Christy Poole able to return demonstration satisfactorily. Safety and hand hygiene in the exercise area reviewed with patient. Christy Poole understanding of the information reviewed. Department expectations discussed with patient and achievable goals were set. The patient shows enthusiasm about attending the program and we look forward to working with Christy Poole. Christy Poole completed a 6 min walk test today and is scheduled to begin exercise on 11/06/23 at 8:15 am.   Interpreter used during appointment. Christy Poole, code [id number].  [id number] Christy JINNY Moats, MS, ACSM-CEP

## 2024-11-05 ENCOUNTER — Telehealth (HOSPITAL_COMMUNITY): Payer: Self-pay

## 2024-11-05 ENCOUNTER — Other Ambulatory Visit (HOSPITAL_BASED_OUTPATIENT_CLINIC_OR_DEPARTMENT_OTHER): Payer: Self-pay

## 2024-11-05 ENCOUNTER — Encounter (HOSPITAL_COMMUNITY): Admission: RE | Admit: 2024-11-05 | Discharge: 2024-11-05 | Attending: Pulmonary Disease

## 2024-11-05 VITALS — Wt 106.3 lb

## 2024-11-05 DIAGNOSIS — J42 Unspecified chronic bronchitis: Secondary | ICD-10-CM

## 2024-11-05 DIAGNOSIS — R0602 Shortness of breath: Secondary | ICD-10-CM

## 2024-11-05 LAB — GLUCOSE, CAPILLARY
Glucose-Capillary: 93 mg/dL (ref 70–99)
Glucose-Capillary: 87 mg/dL (ref 70–99)

## 2024-11-05 NOTE — Progress Notes (Signed)
 Daily Session Note  Patient Details  Name: Christy Poole MRN: [id number] Date of Birth: 13-Oct-1961 Referring Provider:   Flowsheet Row PULMONARY REHAB OTHER RESP ORIENTATION from 10/30/2024 in [facility]  Referring Provider Kassie    Encounter Date: 11/05/2024  Check In:  Session Check In - 11/05/24 0825       Check-In   Supervising physician immediately available to respond to emergencies [facility] MD immediately available    Physician(s) Rosaline Skains, NP    Location [facility]    Staff Present Johnnie Moats, MS, ACSM-CEP, Exercise Physiologist;Mary Harvy, RN, Wetzel Sharps, RT    Virtual Visit No    Medication changes reported     No    Fall or balance concerns reported    No    Tobacco Cessation No Change    Warm-up and Cool-down Performed as group-led instruction    Resistance Training Performed Yes    VAD Patient? No    PAD/SET Patient? No      Pain Assessment   Currently in Pain? No/denies    Multiple Pain Sites No          Capillary Blood Glucose: Results for orders placed or performed during the hospital encounter of 11/05/24 (from the past 24 hours)  Glucose, capillary     Status: None   Collection Time: 11/05/24  8:22 AM  Result Value Ref Range   Glucose-Capillary 93 70 - 99 mg/dL  Glucose, capillary     Status: None   Collection Time: 11/05/24  9:33 AM  Result Value Ref Range   Glucose-Capillary 87 70 - 99 mg/dL      Tobacco Use Ypdunmb[8]  Goals Met:  Proper associated with RPD/PD & O2 Sat Exercise tolerated well No report of concerns or symptoms today Strength training completed today  Goals Unmet:  Not Applicable  Comments: Service time is from 0818 to 0945. Used interpreting services. Interpreter was [place], [id number].    Dr. Slater Kassie is Medical Director for Pulmonary Rehab at [facility].     [1]  Social History Tobacco Use  Smoking Status Never    Passive exposure: Never  Smokeless Tobacco Never

## 2024-11-05 NOTE — Progress Notes (Signed)
 Pulmonary Rehab Education Note  Patient Details  Name: MIYEKO MAHLUM MRN: [id number] Date of Birth: 04-30-1961 Referring Provider:   Flowsheet Row PULMONARY REHAB OTHER RESP ORIENTATION from 10/30/2024 in [facility]  Referring Provider Kassie    RN educated pt with interpreter and son on how to use her inhaler. RN went over when to use her inhaler, why she is using it, side effects and mechanism of action. Pt uncoordinated to push inhaler and breathe in. RN will reach out to her pulmonologist to get a spacer order. RN provided a practice inhaler to take home. Instructed pt to practice using at home. Re-enforced to pt to rinse mouth out after use. RN also educated on use of incentive spirometer. Pt practiced multiple times with RN and able to perform correctly. All questions answered and pt and son understand without assistance.   Service time is from 0933 to [phone]

## 2024-11-05 NOTE — Telephone Encounter (Signed)
 Called Dr. Laymond office to get pt a spacer for her inhaler. In Pulm Rehab attempted to teach pt how to use inhaler, not coordinated to use. Pt given practice inhaler to work on at home. Taught pt and her son, used interpreter services. Also taught pt how to use her incentive spirometer. Will continue to work with pt on her inhaler use and incentive spirometer.

## 2024-11-07 ENCOUNTER — Encounter (HOSPITAL_COMMUNITY): Admission: RE | Admit: 2024-11-07 | Discharge: 2024-11-07 | Attending: Pulmonary Disease

## 2024-11-07 DIAGNOSIS — R0602 Shortness of breath: Secondary | ICD-10-CM

## 2024-11-07 LAB — GLUCOSE, CAPILLARY: Glucose-Capillary: 81 mg/dL (ref 70–99)

## 2024-11-07 NOTE — Progress Notes (Signed)
 Incomplete Pulmonary Rehab Session Note  Patient Details  Name: SAMIA KUKLA MRN: [id number] Date of Birth: November 01, 1960 Referring Provider:   Flowsheet Row PULMONARY REHAB OTHER RESP ORIENTATION from 10/30/2024 in [facility]  Referring Provider Kassie    Amneet RAFFAELA LADLEY did not complete her rehab session.  Wadie's pre-exercise sugar was 81. Per policy, BS needs to be at least 90 to exercise. Rashena's BS usually drops by ~20. Pt educated to eat before exercise, educated on protein and carb using interpreter services. Pt and son understand without assistance.

## 2024-11-08 ENCOUNTER — Ambulatory Visit: Attending: Cardiovascular Disease | Admitting: *Deleted

## 2024-11-08 DIAGNOSIS — Z8673 Personal history of transient ischemic attack (TIA), and cerebral infarction without residual deficits: Secondary | ICD-10-CM

## 2024-11-08 DIAGNOSIS — Z7901 Long term (current) use of anticoagulants: Secondary | ICD-10-CM

## 2024-11-08 DIAGNOSIS — Z952 Presence of prosthetic heart valve: Secondary | ICD-10-CM

## 2024-11-08 DIAGNOSIS — I4821 Permanent atrial fibrillation: Secondary | ICD-10-CM

## 2024-11-08 LAB — POCT INR: INR: 4.7 — AB (ref 2.0–3.0)

## 2024-11-08 NOTE — Patient Instructions (Signed)
 Description   INR-4.7; Do not take any warfarin today and no warfarin tomorrow then continue taking warfarin 2 tablets daily, except 3 tablets on Tuesdays and Thursdays.  Eat 3 helpings of greens per week Recheck INR in 2 weeks. Coumadin  Clinic [phone]

## 2024-11-08 NOTE — Progress Notes (Signed)
 Description   INR-4.7; Do not take any warfarin today and no warfarin tomorrow then continue taking warfarin 2 tablets daily, except 3 tablets on Tuesdays and Thursdays.  Eat 3 helpings of greens per week Recheck INR in 2 weeks. Coumadin  Clinic [phone]

## 2024-11-12 ENCOUNTER — Telehealth (HOSPITAL_COMMUNITY): Payer: Self-pay | Admitting: *Deleted

## 2024-11-12 ENCOUNTER — Encounter (HOSPITAL_COMMUNITY)

## 2024-11-12 NOTE — Telephone Encounter (Signed)
 Returned call to Tyshell's son. Discussed Porter returning next week if she is symptom free for 48 hrs. He voiced understanding.

## 2024-11-12 NOTE — Telephone Encounter (Signed)
 Patient's son left message on department voicemail this morning. Melyssa has been sick with the flu the past couple of days and will be absent from pulmonary rehab this morning. Son left number for follow-up call [phone]. Will forward message to pulmonary rehab staff.

## 2024-11-13 NOTE — Progress Notes (Signed)
 Pulmonary Individual Treatment Plan  Patient Details  Name: Christy Poole MRN: [id number] Date of Birth: Sep 27, 1961 Referring Provider:   Flowsheet Row PULMONARY REHAB OTHER RESP ORIENTATION from 10/30/2024 in [facility], V[facility], & Lung [facility]  Referring Provider Kassie    Initial Encounter Date:  Flowsheet Row PULMONARY REHAB OTHER RESP ORIENTATION from 10/30/2024 in [facility] for Heart, Vascular, & Lung Health  Date 10/30/24    Visit Diagnosis: Shortness of breath  Patient's Home Medications on Admission:  Current Medications[1]  Past Medical History: Past Medical History:  Diagnosis Date   Atrial fibrillation (HCC)    Coronary artery disease    s/p LAD stents   Diabetes mellitus without complication (HCC)    Dyspnea    Headache    HLD (hyperlipidemia)    Hypertension    Memory loss    due to stroke in 2024   Neuromuscular disorder (HCC)    feet   Pneumonia    x 1   Seizure (HCC)    last one was in 09/2023   Stroke [facility]) 2019   Thyroid disease    Walker as ambulation aid     Tobacco Use: Tobacco Use History[2]  Labs: Review Flowsheet  More data exists      Latest Ref Rng & Units 09/12/2023 11/27/2023 11/29/2023 06/11/2024 10/11/2024  Labs for ITP Cardiac and Pulmonary Rehab  Hemoglobin A1c 4.8 - 5.6 % 7.0  5.8  - 6.0  6.0  6.0  6.0  5.9  CANCELED   PH, Arterial 7.35 - 7.45 - - 7.346  7.300  7.290  7.339  7.394  7.390  - -  PCO2 arterial 32 - 48 mmHg - - 42.4  41.8  46.7  44.4  31.5  36.6  - -  Bicarbonate 20.0 - 28.0 mmol/L - - 23.2  20.6  22.7  23.9  25.6  19.3  22.1  - -  TCO2 22 - 32 mmol/L - - 25  22  24  25  25  26  27  20  24  22  23   - -  Acid-base deficit 0.0 - 2.0 mmol/L - - 2.0  5.0  4.0  2.0  1.0  5.0  2.0  - -  O2 Saturation % - - 94  93  97  100  84  100  100  - -    Details       Multiple values from one day are sorted in reverse-chronological order         Capillary Blood  Glucose: Lab Results  Component Value Date   GLUCAP 81 11/07/2024   GLUCAP 87 11/05/2024   GLUCAP 93 11/05/2024   GLUCAP 158 (H) 06/07/2024   GLUCAP 119 (H) 05/17/2024     Pulmonary Assessment Scores:  Pulmonary Assessment Scores     Row Name 10/30/24 0928         ADL UCSD   ADL Phase Entry     SOB Score total 53       CAT Score   CAT Score 22       mMRC Score   mMRC Score 4       UCSD: Self-administered rating of dyspnea associated with activities of daily living (ADLs) 6-point scale (0 = not at all to 5 = maximal or unable to do because of breathlessness)  Scoring Scores range from 0 to 120.  Minimally important difference is 5 units  CAT: CAT can identify the health impairment of COPD patients and is better correlated with disease progression.  CAT has a scoring range of zero to 40. The CAT score is classified into four groups of low (less than 10), medium (10 - 20), high (21-30) and very high (31-40) based on the impact level of disease on health status. A CAT score over 10 suggests significant symptoms.  A worsening CAT score could be explained by an exacerbation, poor medication adherence, poor inhaler technique, or progression of COPD or comorbid conditions.  CAT MCID is 2 points  mMRC: mMRC (Modified Medical Research Council) Dyspnea Scale is used to assess the degree of baseline functional disability in patients of respiratory disease due to dyspnea. No minimal important difference is established. A decrease in score of 1 point or greater is considered a positive change.   Pulmonary Function Assessment:  Pulmonary Function Assessment - 10/30/24 0920       Breath   Bilateral Breath Sounds Clear    Shortness of Breath Yes;Fear of Shortness of Breath;Limiting activity          Exercise Target Goals: Exercise Program Goal: Individual exercise prescription set using results from initial 6 min walk test and THRR while considering  patients activity  barriers and safety.   Exercise Prescription Goal: Initial exercise prescription builds to 30-45 minutes a day of aerobic activity, 2-3 days per week.  Home exercise guidelines will be given to patient during program as part of exercise prescription that the participant will acknowledge.  Activity Barriers & Risk Stratification:  Activity Barriers & Cardiac Risk Stratification - 10/30/24 0915       Activity Barriers & Cardiac Risk Stratification   Activity Barriers Assistive Device;Deconditioning;Balance Concerns;Muscular Weakness;Shortness of Breath          6 Minute Walk:  6 Minute Walk     Row Name 10/30/24 1034         6 Minute Walk   Phase Initial     Distance 1088 feet     Walk Time 6 minutes     # of Rest Breaks 0     MPH 2.06     METS 1.91     RPE 13     Perceived Dyspnea  2     VO2 Peak 6.68     Symptoms No     Resting HR 90 bpm     Resting BP 102/64     Resting Oxygen Saturation  99 %     Exercise Oxygen Saturation  during 6 min walk 93 %     Max Ex. HR 95 bpm     Max Ex. BP 114/60     2 Minute Post BP 100/60       Interval HR   1 Minute HR 88     2 Minute HR 95     3 Minute HR 82     4 Minute HR 81     5 Minute HR 79     6 Minute HR 78     2 Minute Post HR 78     Interval Heart Rate? Yes       Interval Oxygen   Interval Oxygen? Yes     Baseline Oxygen Saturation % 99 %     1 Minute Oxygen Saturation % 94 %     1 Minute Liters of Oxygen 0 L     2 Minute Oxygen Saturation % 95 %     2 Minute  Liters of Oxygen 0 L     3 Minute Oxygen Saturation % 95 %     3 Minute Liters of Oxygen 0 L     4 Minute Oxygen Saturation % 94 %     4 Minute Liters of Oxygen 0 L     5 Minute Oxygen Saturation % 93 %     5 Minute Liters of Oxygen 0 L     6 Minute Oxygen Saturation % 97 %     6 Minute Liters of Oxygen 0 L     2 Minute Post Oxygen Saturation % 99 %     2 Minute Post Liters of Oxygen 0 L        Oxygen Initial Assessment:  Oxygen Initial Assessment  - 10/30/24 0914       Home Oxygen   Home Oxygen Device None    Sleep Oxygen Prescription None    Home Exercise Oxygen Prescription None    Home Resting Oxygen Prescription None      Initial 6 min Walk   Oxygen Used None      Program Oxygen Prescription   Program Oxygen Prescription None      Intervention   Short Term Goals To learn and understand importance of monitoring SPO2 with pulse oximeter and demonstrate accurate use of the pulse oximeter.;To learn and understand importance of maintaining oxygen saturations>88%;To learn and demonstrate proper pursed lip breathing techniques or other breathing techniques. ;To learn and demonstrate proper use of respiratory medications    Long  Term Goals Verbalizes importance of monitoring SPO2 with pulse oximeter and return demonstration;Maintenance of O2 saturations>88%;Exhibits proper breathing techniques, such as pursed lip breathing or other method taught during program session;Compliance with respiratory medication          Oxygen Re-Evaluation:  Oxygen Re-Evaluation     Row Name 11/04/24 0729             Program Oxygen Prescription   Program Oxygen Prescription None         Home Oxygen   Home Oxygen Device None       Sleep Oxygen Prescription None       Home Exercise Oxygen Prescription None       Home Resting Oxygen Prescription None         Goals/Expected Outcomes   Short Term Goals To learn and understand importance of monitoring SPO2 with pulse oximeter and demonstrate accurate use of the pulse oximeter.;To learn and understand importance of maintaining oxygen saturations>88%;To learn and demonstrate proper pursed lip breathing techniques or other breathing techniques. ;To learn and demonstrate proper use of respiratory medications       Long  Term Goals Verbalizes importance of monitoring SPO2 with pulse oximeter and return demonstration;Maintenance of O2 saturations>88%;Exhibits proper breathing techniques, such as pursed  lip breathing or other method taught during program session;Compliance with respiratory medication       Goals/Expected Outcomes Compliance and understanding of oxygen saturation monitoring and breathing techniques to decrease shortness of breath.          Oxygen Discharge (Final Oxygen Re-Evaluation):  Oxygen Re-Evaluation - 11/04/24 0729       Program Oxygen Prescription   Program Oxygen Prescription None      Home Oxygen   Home Oxygen Device None    Sleep Oxygen Prescription None    Home Exercise Oxygen Prescription None    Home Resting Oxygen Prescription None      Goals/Expected Outcomes   Short Term  Goals To learn and understand importance of monitoring SPO2 with pulse oximeter and demonstrate accurate use of the pulse oximeter.;To learn and understand importance of maintaining oxygen saturations>88%;To learn and demonstrate proper pursed lip breathing techniques or other breathing techniques. ;To learn and demonstrate proper use of respiratory medications    Long  Term Goals Verbalizes importance of monitoring SPO2 with pulse oximeter and return demonstration;Maintenance of O2 saturations>88%;Exhibits proper breathing techniques, such as pursed lip breathing or other method taught during program session;Compliance with respiratory medication    Goals/Expected Outcomes Compliance and understanding of oxygen saturation monitoring and breathing techniques to decrease shortness of breath.          Initial Exercise Prescription:  Initial Exercise Prescription - 10/30/24 1000       Date of Initial Exercise RX and Referring Provider   Date 10/30/24    Referring Provider Kassie    Expected Discharge Date 01/21/25      NuStep   Level 1    SPM 80    Minutes 15    METs 2.5      Track   Minutes 15    METs 2      Prescription Details   Frequency (times per week) 2    Duration Progress to 30 minutes of continuous aerobic without signs/symptoms of physical distress       Intensity   THRR 40-80% of Max Heartrate 62-124    Ratings of Perceived Exertion 11-13    Perceived Dyspnea 0-4      Progression   Progression Continue progressive overload as per policy without signs/symptoms or physical distress.      Resistance Training   Training Prescription Yes    Weight red bands    Reps 10-15          Perform Capillary Blood Glucose checks as needed.  Exercise Prescription Changes:   Exercise Prescription Changes     Row Name 11/05/24 [phone]             Response to Exercise   Blood Pressure (Admit) 116/66       Blood Pressure (Exit) 114/62       Heart Rate (Admit) 87 bpm       Heart Rate (Exercise) 80 bpm       Heart Rate (Exit) 73 bpm       Oxygen Saturation (Admit) 100 %       Oxygen Saturation (Exercise) 97 %       Oxygen Saturation (Exit) 98 %       Rating of Perceived Exertion (Exercise) 12       Perceived Dyspnea (Exercise) 1       Duration Progress to 30 minutes of  aerobic without signs/symptoms of physical distress       Intensity THRR unchanged         Progression   Progression Continue to progress workloads to maintain intensity without signs/symptoms of physical distress.         Resistance Training   Weight red bands       Reps 10-15       Time 10 Minutes         NuStep   Level 1       SPM 87       Minutes 19       METs 3.3          Exercise Comments:   Exercise Comments     Row Name 11/05/24 [phone]  Exercise Comments Courtlyn completed her first day of exercise. She exercised for 19 min on the Nustep. She averaged 3.3 METs at level 1. Amaira performed the warmup and cooldown standing. Pt used interpreter during exercise sessions.          Exercise Goals and Review:   Exercise Goals     Row Name 10/30/24 0913             Exercise Goals   Increase Physical Activity Yes       Intervention Provide advice, education, support and counseling about physical activity/exercise needs.;Develop an  individualized exercise prescription for aerobic and resistive training based on initial evaluation findings, risk stratification, comorbidities and participant's personal goals.       Expected Outcomes Short Term: Attend rehab on a regular basis to increase amount of physical activity.;Long Term: Exercising regularly at least 3-5 days a week.;Long Term: Add in home exercise to make exercise part of routine and to increase amount of physical activity.       Increase Strength and Stamina Yes       Intervention Provide advice, education, support and counseling about physical activity/exercise needs.;Develop an individualized exercise prescription for aerobic and resistive training based on initial evaluation findings, risk stratification, comorbidities and participant's personal goals.       Expected Outcomes Short Term: Increase workloads from initial exercise prescription for resistance, speed, and METs.;Short Term: Perform resistance training exercises routinely during rehab and add in resistance training at home;Long Term: Improve cardiorespiratory fitness, muscular endurance and strength as measured by increased METs and functional capacity ( )       Able to understand and use rate of perceived exertion (RPE) scale Yes       Intervention Provide education and explanation on how to use RPE scale       Expected Outcomes Long Term:  Able to use RPE to guide intensity level when exercising independently;Short Term: Able to use RPE daily in rehab to express subjective intensity level       Able to understand and use Dyspnea scale Yes       Intervention Provide education and explanation on how to use Dyspnea scale       Expected Outcomes Short Term: Able to use Dyspnea scale daily in rehab to express subjective sense of shortness of breath during exertion       Knowledge and understanding of Target Heart Rate Range (THRR) Yes       Intervention Provide education and explanation of THRR including how the  numbers were predicted and where they are located for reference       Expected Outcomes Short Term: Able to state/look up THRR;Long Term: Able to use THRR to govern intensity when exercising independently;Short Term: Able to use daily as guideline for intensity in rehab       Understanding of Exercise Prescription Yes       Intervention Provide education, explanation, and written materials on patient's individual exercise prescription       Expected Outcomes Short Term: Able to explain program exercise prescription;Long Term: Able to explain home exercise prescription to exercise independently          Exercise Goals Re-Evaluation :  Exercise Goals Re-Evaluation     Row Name 11/04/24 0727             Exercise Goal Re-Evaluation   Exercise Goals Review Increase Physical Activity;Able to understand and use rate of perceived exertion (RPE) scale;Knowledge and understanding of Target Heart Rate  Range (THRR);Understanding of Exercise Prescription;Increase Strength and Stamina;Able to understand and use Dyspnea scale       Comments Jacklyn is scheduled to begin exercise in PR on 1/13. Will continue to monitor and progress as able.       Expected Outcomes Through exercise at rehab and home, the patient will decrease shortness of breath with daily activities and feel confident in carrying out an exercise regimen at home.          Discharge Exercise Prescription (Final Exercise Prescription Changes):  Exercise Prescription Changes - 11/05/24 0839       Response to Exercise   Blood Pressure (Admit) 116/66    Blood Pressure (Exit) 114/62    Heart Rate (Admit) 87 bpm    Heart Rate (Exercise) 80 bpm    Heart Rate (Exit) 73 bpm    Oxygen Saturation (Admit) 100 %    Oxygen Saturation (Exercise) 97 %    Oxygen Saturation (Exit) 98 %    Rating of Perceived Exertion (Exercise) 12    Perceived Dyspnea (Exercise) 1    Duration Progress to 30 minutes of  aerobic without signs/symptoms of physical  distress    Intensity THRR unchanged      Progression   Progression Continue to progress workloads to maintain intensity without signs/symptoms of physical distress.      Resistance Training   Weight red bands    Reps 10-15    Time 10 Minutes      NuStep   Level 1    SPM 87    Minutes 19    METs 3.3          Nutrition:  Target Goals: Understanding of nutrition guidelines, daily intake of sodium 1500mg , cholesterol 200mg , calories 30% from fat and 7% or less from saturated fats, daily to have 5 or more servings of fruits and vegetables.  Biometrics:    Nutrition Therapy Plan and Nutrition Goals:  Nutrition Therapy & Goals - 11/05/24 0953       Nutrition Therapy   Diet Vegetarian diet with eggs, dairy    Drug/Food Interactions Coumadin /Vit K;Statins/Certain Fruits      Personal Nutrition Goals   Nutrition Goal Patient to identify strategies for weight maintenance.    Personal Goal #2 Maintain glycemic control during pulmonary rehab.    Comments Patient with medical history significant for stroke, seizures, DM2 and atrial fibrillation. Per chart, pt experiencing shortness of breath since 2018 after stroke. Stable wt > 3 months noted. BMI currently within ideal range. Pt denies any issues with appetite. Blood glucose within appropriate range based on most recent A1c of 5.9% on 12/19/205. Pt denies any nutrition-related problems/concerns at this time.      Intervention Plan   Intervention Prescribe, educate and counsel regarding individualized specific dietary modifications aiming towards targeted core components such as weight, hypertension, lipid management, diabetes, heart failure and other comorbidities.    Expected Outcomes Short Term Goal: Understand basic principles of dietary content, such as calories, fat, sodium, cholesterol and nutrients.;Long Term Goal: Adherence to prescribed nutrition plan.          Nutrition Assessments:  MEDIFICTS Score Key: >=70 Need  to make dietary changes  40-70 Heart Healthy Diet <= 40 Therapeutic Level Cholesterol Diet   Picture Your Plate Scores: <59 Unhealthy dietary pattern with much room for improvement. 41-50 Dietary pattern unlikely to meet recommendations for good health and room for improvement. 51-60 More healthful dietary pattern, with some room for improvement.  >60  Healthy dietary pattern, although there may be some specific behaviors that could be improved.    Nutrition Goals Re-Evaluation:   Nutrition Goals Discharge (Final Nutrition Goals Re-Evaluation):   Psychosocial: Target Goals: Acknowledge presence or absence of significant depression and/or stress, maximize coping skills, provide positive support system. Participant is able to verbalize types and ability to use techniques and skills needed for reducing stress and depression.  Initial Review & Psychosocial Screening:  Initial Psych Review & Screening - 10/30/24 0916       Initial Review   Current issues with Current Stress Concerns    Source of Stress Concerns Chronic Illness    Comments Will continue to montior and offer supoprt as needed.      Family Dynamics   Good Support System? Yes    Comments She has good support from her son.      Barriers   Psychosocial barriers to participate in program The patient should benefit from training in stress management and relaxation.;Psychosocial barriers identified (see note)      Screening Interventions   Interventions Encouraged to exercise;To provide support and resources with identified psychosocial needs;Provide feedback about the scores to participant    Expected Outcomes Short Term goal: Utilizing psychosocial counselor, staff and physician to assist with identification of specific Stressors or current issues interfering with healing process. Setting desired goal for each stressor or current issue identified.;Long Term Goal: Stressors or current issues are controlled or  eliminated.;Short Term goal: Identification and review with participant of any Quality of Life or Depression concerns found by scoring the questionnaire.;Long Term goal: The participant improves quality of Life and PHQ9 Scores as seen by post scores and/or verbalization of changes          Quality of Life Scores:  Scores of 19 and below usually indicate a poorer quality of life in these areas.  A difference of  2-3 points is a clinically meaningful difference.  A difference of 2-3 points in the total score of the Quality of Life Index has been associated with significant improvement in overall quality of life, self-image, physical symptoms, and general health in studies assessing change in quality of life.  PHQ-9: Review Flowsheet  More data exists      10/30/2024 06/11/2024 04/23/2024 01/16/2024 11/06/2023  Depression screen PHQ 2/9  Decreased Interest 0 0 2 0 0  Down, Depressed, Hopeless 0 0 1 0 0  PHQ - 2 Score 0 0 3 0 0  Altered sleeping 1 0 1 0 -  Tired, decreased energy 1 0 2 0 -  Change in appetite 0 0 0 0 -  Feeling bad or failure about yourself  0 0 0 0 -  Trouble concentrating 0 0 0 0 -  Moving slowly or fidgety/restless 0 0 0 0 -  Suicidal thoughts 0 0 0 0 -  PHQ-9 Score 2 0  6  0  -  Difficult doing work/chores Somewhat difficult Not difficult at all Not difficult at all Not difficult at all -    Details       Data saved with a previous flowsheet row definition        Interpretation of Total Score  Total Score Depression Severity:  1-4 = Minimal depression, 5-9 = Mild depression, 10-14 = Moderate depression, 15-19 = Moderately severe depression, 20-27 = Severe depression   Psychosocial Evaluation and Intervention:  Psychosocial Evaluation - 10/30/24 0916       Psychosocial Evaluation & Interventions   Interventions Stress  management education;Relaxation education;Encouraged to exercise with the program and follow exercise prescription    Comments Stress concerns  due to chronic illness.    Expected Outcomes For Laketha to participate in rehab free of psychosocial concerns.    Continue Psychosocial Services  Follow up required by staff          Psychosocial Re-Evaluation:  Psychosocial Re-Evaluation     Row Name 11/01/24 [phone]             Psychosocial Re-Evaluation   Current issues with Current Stress Concerns       Comments Khari is scheduled to start PR on 11/05/24. No new psy/soc barriers or concerns at this time.       Expected Outcomes For Doloras to have less stress while participating in PR       Interventions Stress management education;Relaxation education;Encouraged to attend Pulmonary Rehabilitation for the exercise       Continue Psychosocial Services  Follow up required by staff          Psychosocial Discharge (Final Psychosocial Re-Evaluation):  Psychosocial Re-Evaluation - 11/01/24 9061       Psychosocial Re-Evaluation   Current issues with Current Stress Concerns    Comments Itzae is scheduled to start PR on 11/05/24. No new psy/soc barriers or concerns at this time.    Expected Outcomes For Kyliah to have less stress while participating in PR    Interventions Stress management education;Relaxation education;Encouraged to attend Pulmonary Rehabilitation for the exercise    Continue Psychosocial Services  Follow up required by staff          Education: Education Goals: Education classes will be provided on a weekly basis, covering required topics. Participant will state understanding/return demonstration of topics presented.  Learning Barriers/Preferences:  Learning Barriers/Preferences - 10/30/24 9082       Learning Barriers/Preferences   Learning Barriers Language;Sight    Learning Preferences Individual Instruction          Education Topics: Know Your Numbers Group instruction that is supported by a PowerPoint presentation. Instructor discusses importance of knowing and understanding resting,  exercise, and post-exercise oxygen saturation, heart rate, and blood pressure. Oxygen saturation, heart rate, blood pressure, rating of perceived exertion, and dyspnea are reviewed along with a normal range for these values.    Exercise for the Pulmonary Patient Group instruction that is supported by a PowerPoint presentation. Instructor discusses benefits of exercise, core components of exercise, frequency, duration, and intensity of an exercise routine, importance of utilizing pulse oximetry during exercise, safety while exercising, and options of places to exercise outside of rehab.    MET Level  Group instruction provided by PowerPoint, verbal discussion, and written material to support subject matter. Instructor reviews what METs are and how to increase METs.    Pulmonary Medications Verbally interactive group education provided by instructor with focus on inhaled medications and proper administration.   Anatomy and Physiology of the Respiratory System Group instruction provided by PowerPoint, verbal discussion, and written material to support subject matter. Instructor reviews respiratory cycle and anatomical components of the respiratory system and their functions. Instructor also reviews differences in obstructive and restrictive respiratory diseases with examples of each.    Oxygen Safety Group instruction provided by PowerPoint, verbal discussion, and written material to support subject matter. There is an overview of What is Oxygen and Why do we need it.  Instructor also reviews how to create a safe environment for oxygen use, the importance of using oxygen as prescribed,  and the risks of noncompliance. There is a brief discussion on traveling with oxygen and resources the patient may utilize.   Oxygen Use Group instruction provided by PowerPoint, verbal discussion, and written material to discuss how supplemental oxygen is prescribed and different types of oxygen supply  systems. Resources for more information are provided.    Breathing Techniques Group instruction that is supported by demonstration and informational handouts. Instructor discusses the benefits of pursed lip and diaphragmatic breathing and detailed demonstration on how to perform both.     Risk Factor Reduction Group instruction that is supported by a PowerPoint presentation. Instructor discusses the definition of a risk factor, different risk factors for pulmonary disease, and how the heart and lungs work together.   Pulmonary Diseases Group instruction provided by PowerPoint, verbal discussion, and written material to support subject matter. Instructor gives an overview of the different type of pulmonary diseases. There is also a discussion on risk factors and symptoms as well as ways to manage the diseases.   Stress and Energy Conservation Group instruction provided by PowerPoint, verbal discussion, and written material to support subject matter. Instructor gives an overview of stress and the impact it can have on the body. Instructor also reviews ways to reduce stress. There is also a discussion on energy conservation and ways to conserve energy throughout the day.   Warning Signs and Symptoms Group instruction provided by PowerPoint, verbal discussion, and written material to support subject matter. Instructor reviews warning signs and symptoms of stroke, heart attack, cold and flu. Instructor also reviews ways to prevent the spread of infection.   Other Education Group or individual verbal, written, or video instructions that support the educational goals of the pulmonary rehab program.    Knowledge Questionnaire Score:  Knowledge Questionnaire Score - 10/30/24 1036       Knowledge Questionnaire Score   Pre Score 11/18          Core Components/Risk Factors/Patient Goals at Admission:  Personal Goals and Risk Factors at Admission - 10/30/24 0917       Core  Components/Risk Factors/Patient Goals on Admission   Improve shortness of breath with ADL's Yes    Intervention Provide education, individualized exercise plan and daily activity instruction to help decrease symptoms of SOB with activities of daily living.    Expected Outcomes Short Term: Improve cardiorespiratory fitness to achieve a reduction of symptoms when performing ADLs;Long Term: Be able to perform more ADLs without symptoms or delay the onset of symptoms    Increase knowledge of respiratory medications and ability to use respiratory devices properly  Yes    Intervention Provide education and demonstration as needed of appropriate use of medications, inhalers, and oxygen therapy.    Expected Outcomes Short Term: Achieves understanding of medications use. Understands that oxygen is a medication prescribed by physician. Demonstrates appropriate use of inhaler and oxygen therapy.;Long Term: Maintain appropriate use of medications, inhalers, and oxygen therapy.    Diabetes Yes          Core Components/Risk Factors/Patient Goals Review:   Goals and Risk Factor Review     Row Name 11/01/24 0941             Core Components/Risk Factors/Patient Goals Review   Personal Goals Review Improve shortness of breath with ADL's;Develop more efficient breathing techniques such as purse lipped breathing and diaphragmatic breathing and practicing self-pacing with activity.;Increase knowledge of respiratory medications and ability to use respiratory devices properly.;Diabetes  Review Monthly review of patients Core Components/Risk Factors/Patient Goals are as follows: Korinne is scheduled to start PR on 11/01/24. Unable to asses her goals at this time.       Expected Outcomes To improve shortness of breath with ADL's, develop more efficient breathing techniques such as purse lipped breathing and diaphragmatic breathing; and practicing self-pacing with activity, increase knowledge of respiratory  medications and ability to use respiratory devices properly and manage diabetes.          Core Components/Risk Factors/Patient Goals at Discharge (Final Review):   Goals and Risk Factor Review - 11/01/24 0941       Core Components/Risk Factors/Patient Goals Review   Personal Goals Review Improve shortness of breath with ADL's;Develop more efficient breathing techniques such as purse lipped breathing and diaphragmatic breathing and practicing self-pacing with activity.;Increase knowledge of respiratory medications and ability to use respiratory devices properly.;Diabetes    Review Monthly review of patients Core Components/Risk Factors/Patient Goals are as follows: Shonta is scheduled to start PR on 11/01/24. Unable to asses her goals at this time.    Expected Outcomes To improve shortness of breath with ADL's, develop more efficient breathing techniques such as purse lipped breathing and diaphragmatic breathing; and practicing self-pacing with activity, increase knowledge of respiratory medications and ability to use respiratory devices properly and manage diabetes.          ITP Comments:Pt is making expected progress toward Pulmonary Rehab goals after completing 1 session(s). Recommend continued exercise, life style modification, education, and utilization of breathing techniques to increase stamina and strength, while also decreasing shortness of breath with exertion.  Dr. Slater Staff is Medical Director for Pulmonary Rehab at [facility].          [1]  Current Outpatient Medications:    acetaminophen  (TYLENOL ) 500 MG tablet, Take 500 mg by mouth every 6 (six) hours as needed for mild pain (pain score 1-3), headache or fever., Disp: , Rfl:    albuterol  (VENTOLIN  HFA) 108 (90 Base) MCG/ACT inhaler, Inhale 1-2 puffs into the lungs every 4 (four) hours as needed for wheezing or shortness of breath., Disp: 1 each, Rfl: 2   ASPIRIN  LOW DOSE 81 MG tablet, TAKE 1 TABLET (81 MG  TOTAL) BY MOUTH DAILY. (SWALLOW WHOLE), Disp: 90 tablet, Rfl: 3   atorvastatin  (LIPITOR ) 80 MG tablet, Take 1 tablet (80 mg total) by mouth daily., Disp: 90 tablet, Rfl: 3   azelastine  (ASTELIN ) 0.1 % nasal spray, Place 1 spray into both nostrils 2 (two) times daily. Use in each nostril as directed, Disp: 30 mL, Rfl: 1   azithromycin  (ZITHROMAX  Z-PAK) 250 MG tablet, Take 500 mg on day 1 followed by 250 mg for 4 days.  Take medication for a total of 5 days., Disp: 6 tablet, Rfl: 0   cetaphil (CETAPHIL) lotion, Apply 1 Application topically daily as needed for dry skin., Disp: , Rfl:    gabapentin  (NEURONTIN ) 100 MG capsule, Take 1 capsule (100 mg total) by mouth at bedtime., Disp: 60 capsule, Rfl: 1   levETIRAcetam  (KEPPRA ) 250 MG tablet, Take 1 tablet (250 mg total) by mouth 2 (two) times daily., Disp: 180 tablet, Rfl: 3   levothyroxine  (SYNTHROID ) 50 MCG tablet, TAKE 1 TABLET (50 MCG TOTAL) BY MOUTH AT BEDTIME., Disp: 90 tablet, Rfl: 0   metFORMIN  (GLUCOPHAGE -XR) 500 MG 24 hr tablet, TAKE 1 TABLET (500 MG TOTAL) BY MOUTH DAILY WITH BREAKFAST., Disp: 90 tablet, Rfl: 1   metoprolol  succinate (TOPROL -XL) 25 MG  24 hr tablet, Take 1 tablet (25 mg total) by mouth daily. Take with or immediately following a meal., Disp: 90 tablet, Rfl: 3   mometasone -formoterol  (DULERA ) 100-5 MCG/ACT AERO, Inhale 2 puffs into the lungs 2 (two) times daily as needed for wheezing or shortness of breath., Disp: , Rfl:    promethazine -dextromethorphan (PROMETHAZINE -DM) 6.25-15 MG/5ML syrup, Take 5 mLs by mouth 3 (three) times daily as needed., Disp: 240 mL, Rfl: 0   warfarin (COUMADIN ) 2.5 MG tablet, Take 2 to 3 tablets by mouth daily or as directed by Coumadin  Clinic, Disp: 90 tablet, Rfl: 1 [2]  Social History Tobacco Use  Smoking Status Never   Passive exposure: Never  Smokeless Tobacco Never

## 2024-11-14 ENCOUNTER — Encounter (HOSPITAL_COMMUNITY)

## 2024-11-18 ENCOUNTER — Telehealth (HOSPITAL_COMMUNITY): Payer: Self-pay | Admitting: *Deleted

## 2024-11-18 NOTE — Telephone Encounter (Signed)
 Notified pt's son, Layla, that her normal 8:15 Pulmonary Rehab class is cancelled for tomorrow due to road conditions. Offered them the 10:15 or 1:15 class but he is unable to transport her to a later class tomorrow.  Aliene Aris BS, ACSM-CEP 11/18/2024 2:37 PM

## 2024-11-19 ENCOUNTER — Encounter (HOSPITAL_COMMUNITY)

## 2024-11-20 ENCOUNTER — Telehealth (HOSPITAL_COMMUNITY): Payer: Self-pay

## 2024-11-20 NOTE — Telephone Encounter (Signed)
 LVM for pt's son about the possibility of the elevator not working at [employer]. If able to take stairs then park on the 2nd floor of our parking garage. If unable to take stairs we will reschedule the appointment.

## 2024-11-21 ENCOUNTER — Ambulatory Visit

## 2024-11-21 ENCOUNTER — Encounter (HOSPITAL_COMMUNITY)
Admission: RE | Admit: 2024-11-21 | Discharge: 2024-11-21 | Disposition: A | Discharge status: Home / Self Care | Source: Ambulatory Visit | Attending: Pulmonary Disease | Admitting: Pulmonary Disease

## 2024-11-21 ENCOUNTER — Other Ambulatory Visit: Payer: Self-pay

## 2024-11-21 DIAGNOSIS — R0602 Shortness of breath: Secondary | ICD-10-CM

## 2024-11-21 DIAGNOSIS — Z952 Presence of prosthetic heart valve: Secondary | ICD-10-CM

## 2024-11-21 DIAGNOSIS — Z7901 Long term (current) use of anticoagulants: Secondary | ICD-10-CM

## 2024-11-21 DIAGNOSIS — I4821 Permanent atrial fibrillation: Secondary | ICD-10-CM

## 2024-11-21 LAB — POCT INR: INR: 5.2 — AB (ref 2.0–3.0)

## 2024-11-21 NOTE — Progress Notes (Signed)
 INR 5.2  Do not take any warfarin today, Friday and Saturday and eat greens for the next 2 days then continue taking warfarin 2 tablets daily, except 3 tablets on Tuesdays and Thursdays.  Eat 3 helpings of greens per week Recheck INR in 3 weeks. Coumadin  Clinic [id number]

## 2024-11-21 NOTE — Progress Notes (Signed)
 Daily Session Note  Patient Details  Name: Christy Poole MRN: [id number] Date of Birth: [birth year] Referring Provider:   Flowsheet Row PULMONARY REHAB OTHER RESP ORIENTATION from 10/30/2024 in [facility] for [facility], V[facility]  Referring Provider Kassie    Encounter Date: 11/21/2024  Check In:  Session Check In - 11/21/24 0809       Check-In   Supervising physician immediately available to respond to emergencies [facility] MD immediately available    Physician(s) Josefa Beauvais, NP    Location [facility]    Staff Present Johnnie Moats, MS, ACSM-CEP, Exercise Physiologist;Mary Harvy, RN, BSN;Nachman Sundt Claudene, RT;Randi Reeve BS, ACSM-CEP, Exercise Physiologist    Virtual Visit No    Medication changes reported     No    Fall or balance concerns reported    No    Tobacco Cessation No Change    Warm-up and Cool-down Performed as group-led instruction    Resistance Training Performed Yes    VAD Patient? No    PAD/SET Patient? No      Pain Assessment   Currently in Pain? No/denies    Multiple Pain Sites No          Capillary Blood Glucose: No results found for this or any previous visit (from the past 24 hours).    Tobacco Use History[1]  Goals Met:  Proper associated with RPD/PD & O2 Sat Independence with exercise equipment Exercise tolerated well No report of concerns or symptoms today Strength training completed today  Goals Unmet:  Not Applicable  Comments: Service time is from 0807 to 0923.    Dr. Slater Kassie is Medical Director for Pulmonary Rehab at [facility].     [1]  Social History Tobacco Use  Smoking Status Never   Passive exposure: Never  Smokeless Tobacco Never

## 2024-11-21 NOTE — Patient Instructions (Signed)
 Do not take any warfarin today, Friday and Saturday and eat greens for the next 2 days then continue taking warfarin 2 tablets daily, except 3 tablets on Tuesdays and Thursdays.  Eat 3 helpings of greens per week Recheck INR in 3 weeks. Coumadin  Clinic [id number]

## 2024-11-22 ENCOUNTER — Other Ambulatory Visit: Payer: Self-pay | Admitting: Cardiology

## 2024-11-22 ENCOUNTER — Other Ambulatory Visit (HOSPITAL_BASED_OUTPATIENT_CLINIC_OR_DEPARTMENT_OTHER): Payer: Self-pay | Admitting: Family Medicine

## 2024-11-22 DIAGNOSIS — Z7901 Long term (current) use of anticoagulants: Secondary | ICD-10-CM

## 2024-11-22 DIAGNOSIS — I050 Rheumatic mitral stenosis: Secondary | ICD-10-CM

## 2024-11-22 DIAGNOSIS — I4821 Permanent atrial fibrillation: Secondary | ICD-10-CM

## 2024-11-22 DIAGNOSIS — Z8673 Personal history of transient ischemic attack (TIA), and cerebral infarction without residual deficits: Secondary | ICD-10-CM

## 2024-11-22 NOTE — Telephone Encounter (Signed)
 Warfarin 2.5mg  Dx-Afib Last INR Check-11/21/24 Last OV- 10/07/24

## 2024-11-25 ENCOUNTER — Telehealth (HOSPITAL_COMMUNITY): Payer: Self-pay

## 2024-11-25 NOTE — Telephone Encounter (Signed)
 Called pt about inclement weather. LVM with call back number.

## 2024-11-26 ENCOUNTER — Ambulatory Visit (HOSPITAL_BASED_OUTPATIENT_CLINIC_OR_DEPARTMENT_OTHER): Payer: Self-pay | Admitting: Family Medicine

## 2024-11-26 ENCOUNTER — Encounter (HOSPITAL_COMMUNITY)
Admission: RE | Admit: 2024-11-26 | Discharge: 2024-11-26 | Disposition: A | Source: Ambulatory Visit | Attending: Family Medicine

## 2024-11-26 ENCOUNTER — Encounter (HOSPITAL_COMMUNITY)

## 2024-11-26 VITALS — Wt 106.5 lb

## 2024-11-26 DIAGNOSIS — R0602 Shortness of breath: Secondary | ICD-10-CM

## 2024-11-26 NOTE — Assessment & Plan Note (Signed)
 Patient with ongoing issues with shortness of breath on exertion.  She does follow with pulmonology, has been referred to pulmonary rehab.  She has not scheduled this as of yet.  Discussed recommendation to proceed with recommended treatment plan as per pulmonologist, patient will reach out to pulmonary rehab to schedule this.

## 2024-11-26 NOTE — Progress Notes (Signed)
 Daily Session Note  Patient Details  Name: Christy Poole MRN: [id number] Date of Birth: Feb 17, 1961 Referring Provider:   Flowsheet Row PULMONARY REHAB OTHER RESP ORIENTATION from 10/30/2024 in [facility] for Heart, Vascular, & Lung Health  Referring Provider Kassie    Encounter Date: 11/26/2024  Check In:  Session Check In - 11/26/24 0820       Check-In   Supervising physician immediately available to respond to emergencies [facility] MD immediately available    Physician(s) Josefa Beauvais, NP    Location [facility]    Staff Present Johnnie Moats, MS, ACSM-CEP, Exercise Physiologist;Mary Harvy, RN, BSN;Casey Claudene, RT;Randi Reeve BS, ACSM-CEP, Exercise Physiologist    Virtual Visit No    Medication changes reported     No    Fall or balance concerns reported    No    Tobacco Cessation No Change    Warm-up and Cool-down Performed as group-led instruction    Resistance Training Performed Yes    VAD Patient? No    PAD/SET Patient? No      Pain Assessment   Currently in Pain? No/denies          Capillary Blood Glucose: No results found for this or any previous visit (from the past 24 hours).   Exercise Prescription Changes - 11/26/24 0900       Response to Exercise   Blood Pressure (Admit) 122/70    Blood Pressure (Exercise) 138/70    Blood Pressure (Exit) 98/60    Heart Rate (Admit) 86 bpm    Heart Rate (Exercise) 110 bpm    Heart Rate (Exit) 102 bpm    Oxygen Saturation (Admit) 100 %    Oxygen Saturation (Exercise) 100 %    Oxygen Saturation (Exit) 98 %    Rating of Perceived Exertion (Exercise) 11    Perceived Dyspnea (Exercise) 0    Duration Progress to 30 minutes of  aerobic without signs/symptoms of physical distress    Intensity THRR unchanged      Progression   Progression Continue to progress workloads to maintain intensity without signs/symptoms of physical distress.      Resistance Training   Weight red bands     Reps 10-15    Time 10 Minutes      NuStep   Level 1    SPM 79    Minutes 15    METs 2.7          Tobacco Use History[1]  Goals Met:  Proper associated with RPD/PD & O2 Sat Exercise tolerated well No report of concerns or symptoms today Strength training completed today  Goals Unmet:  Not Applicable  Comments: Service time is from 0816 to [phone].    Dr. Slater Kassie is Medical Director for Pulmonary Rehab at [facility].     [1]  Social History Tobacco Use  Smoking Status Never   Passive exposure: Never  Smokeless Tobacco Never

## 2024-11-28 ENCOUNTER — Encounter (HOSPITAL_COMMUNITY)

## 2024-12-03 ENCOUNTER — Encounter (HOSPITAL_COMMUNITY)

## 2024-12-05 ENCOUNTER — Encounter (HOSPITAL_COMMUNITY)

## 2024-12-10 ENCOUNTER — Encounter (HOSPITAL_COMMUNITY)

## 2024-12-12 ENCOUNTER — Ambulatory Visit

## 2024-12-12 ENCOUNTER — Encounter (HOSPITAL_COMMUNITY)

## 2024-12-17 ENCOUNTER — Encounter (HOSPITAL_COMMUNITY)

## 2024-12-19 ENCOUNTER — Encounter (HOSPITAL_COMMUNITY)

## 2024-12-24 ENCOUNTER — Encounter (HOSPITAL_COMMUNITY)

## 2024-12-26 ENCOUNTER — Encounter (HOSPITAL_COMMUNITY)

## 2024-12-31 ENCOUNTER — Encounter (HOSPITAL_COMMUNITY)

## 2025-01-02 ENCOUNTER — Encounter (HOSPITAL_COMMUNITY)

## 2025-01-07 ENCOUNTER — Encounter (HOSPITAL_COMMUNITY)

## 2025-01-09 ENCOUNTER — Encounter (HOSPITAL_COMMUNITY)

## 2025-01-14 ENCOUNTER — Encounter (HOSPITAL_COMMUNITY)

## 2025-01-16 ENCOUNTER — Encounter (HOSPITAL_COMMUNITY)

## 2025-01-21 ENCOUNTER — Encounter (HOSPITAL_COMMUNITY)

## 2025-02-10 ENCOUNTER — Ambulatory Visit (HOSPITAL_BASED_OUTPATIENT_CLINIC_OR_DEPARTMENT_OTHER): Admitting: Family Medicine

## 2025-06-10 ENCOUNTER — Ambulatory Visit: Admitting: Adult Health
